# Patient Record
Sex: Male | Born: 1935
Health system: Southern US, Community
[De-identification: ages and names within clinical notes are randomized; demographics above are authoritative.]

## PROBLEM LIST (undated history)

## (undated) DIAGNOSIS — I208 Other forms of angina pectoris: Secondary | ICD-10-CM

## (undated) DIAGNOSIS — N200 Calculus of kidney: Secondary | ICD-10-CM

## (undated) DIAGNOSIS — Z955 Presence of coronary angioplasty implant and graft: Secondary | ICD-10-CM

## (undated) DIAGNOSIS — I10 Essential (primary) hypertension: Secondary | ICD-10-CM

## (undated) DIAGNOSIS — M1990 Unspecified osteoarthritis, unspecified site: Secondary | ICD-10-CM

## (undated) DIAGNOSIS — I4891 Unspecified atrial fibrillation: Secondary | ICD-10-CM

## (undated) DIAGNOSIS — G4733 Obstructive sleep apnea (adult) (pediatric): Secondary | ICD-10-CM

## (undated) DIAGNOSIS — I2510 Atherosclerotic heart disease of native coronary artery without angina pectoris: Secondary | ICD-10-CM

## (undated) DIAGNOSIS — I4892 Unspecified atrial flutter: Secondary | ICD-10-CM

## (undated) DIAGNOSIS — I499 Cardiac arrhythmia, unspecified: Secondary | ICD-10-CM

## (undated) DIAGNOSIS — D509 Iron deficiency anemia, unspecified: Secondary | ICD-10-CM

## (undated) DIAGNOSIS — R531 Weakness: Secondary | ICD-10-CM

## (undated) DIAGNOSIS — C679 Malignant neoplasm of bladder, unspecified: Secondary | ICD-10-CM

## (undated) DIAGNOSIS — Z87898 Personal history of other specified conditions: Secondary | ICD-10-CM

## (undated) DIAGNOSIS — C4490 Unspecified malignant neoplasm of skin, unspecified: Secondary | ICD-10-CM

## (undated) DIAGNOSIS — R319 Hematuria, unspecified: Secondary | ICD-10-CM

## (undated) HISTORY — PX: CARDIOVASCULAR STRESS TEST: SHX262

## (undated) HISTORY — DX: Essential (primary) hypertension: I10

## (undated) HISTORY — DX: Presence of coronary angioplasty implant and graft: Z95.5

## (undated) HISTORY — PX: CARDIAC CATHETERIZATION: SHX172

## (undated) HISTORY — PX: TOTAL KNEE ARTHROPLASTY: SHX125

## (undated) HISTORY — PX: HERNIA REPAIR: SHX51

## (undated) HISTORY — PX: EYE SURGERY: SHX253

---

## 2000-11-29 ENCOUNTER — Ambulatory Visit (HOSPITAL_COMMUNITY): Admission: RE | Admit: 2000-11-29 | Discharge: 2000-11-29 | Payer: Self-pay | Admitting: Internal Medicine

## 2000-11-29 ENCOUNTER — Encounter: Payer: Self-pay | Admitting: Internal Medicine

## 2001-02-05 ENCOUNTER — Encounter: Payer: Self-pay | Admitting: *Deleted

## 2001-02-07 ENCOUNTER — Inpatient Hospital Stay (HOSPITAL_COMMUNITY): Admission: RE | Admit: 2001-02-07 | Discharge: 2001-02-12 | Payer: Self-pay | Admitting: *Deleted

## 2001-02-12 ENCOUNTER — Inpatient Hospital Stay (HOSPITAL_COMMUNITY)
Admission: RE | Admit: 2001-02-12 | Discharge: 2001-02-15 | Payer: Self-pay | Admitting: Physical Medicine & Rehabilitation

## 2001-02-16 ENCOUNTER — Emergency Department (HOSPITAL_COMMUNITY): Admission: EM | Admit: 2001-02-16 | Discharge: 2001-02-16 | Payer: Self-pay | Admitting: Internal Medicine

## 2001-02-16 ENCOUNTER — Encounter: Payer: Self-pay | Admitting: Internal Medicine

## 2001-03-01 ENCOUNTER — Emergency Department (HOSPITAL_COMMUNITY): Admission: EM | Admit: 2001-03-01 | Discharge: 2001-03-01 | Payer: Self-pay | Admitting: Emergency Medicine

## 2001-05-24 ENCOUNTER — Ambulatory Visit (HOSPITAL_COMMUNITY): Admission: RE | Admit: 2001-05-24 | Discharge: 2001-05-24 | Payer: Self-pay | Admitting: Internal Medicine

## 2001-05-24 ENCOUNTER — Encounter: Payer: Self-pay | Admitting: Internal Medicine

## 2001-07-30 ENCOUNTER — Other Ambulatory Visit: Admission: RE | Admit: 2001-07-30 | Discharge: 2001-07-30 | Payer: Self-pay | Admitting: Internal Medicine

## 2001-11-26 ENCOUNTER — Inpatient Hospital Stay (HOSPITAL_COMMUNITY): Admission: RE | Admit: 2001-11-26 | Discharge: 2001-11-30 | Payer: Self-pay | Admitting: *Deleted

## 2001-11-30 ENCOUNTER — Inpatient Hospital Stay (HOSPITAL_COMMUNITY)
Admission: RE | Admit: 2001-11-30 | Discharge: 2001-12-06 | Payer: Self-pay | Admitting: Physical Medicine & Rehabilitation

## 2002-01-29 ENCOUNTER — Observation Stay (HOSPITAL_COMMUNITY): Admission: RE | Admit: 2002-01-29 | Discharge: 2002-01-30 | Payer: Self-pay | Admitting: Internal Medicine

## 2002-07-21 ENCOUNTER — Ambulatory Visit (HOSPITAL_COMMUNITY): Admission: RE | Admit: 2002-07-21 | Discharge: 2002-07-21 | Payer: Self-pay | Admitting: Ophthalmology

## 2004-05-23 ENCOUNTER — Ambulatory Visit (HOSPITAL_COMMUNITY): Admission: RE | Admit: 2004-05-23 | Discharge: 2004-05-23 | Payer: Self-pay | Admitting: Ophthalmology

## 2005-05-03 ENCOUNTER — Ambulatory Visit: Payer: Self-pay | Admitting: Family Medicine

## 2005-05-03 ENCOUNTER — Ambulatory Visit (HOSPITAL_COMMUNITY): Admission: RE | Admit: 2005-05-03 | Discharge: 2005-05-03 | Payer: Self-pay | Admitting: Internal Medicine

## 2006-02-07 ENCOUNTER — Emergency Department (HOSPITAL_COMMUNITY): Admission: EM | Admit: 2006-02-07 | Discharge: 2006-02-07 | Payer: Self-pay | Admitting: Emergency Medicine

## 2007-08-22 ENCOUNTER — Encounter: Payer: Self-pay | Admitting: Family Medicine

## 2010-09-20 NOTE — Letter (Signed)
Summary: rpc chart  rpc chart   Imported By: Curtis Sites 06/06/2010 10:41:07  _____________________________________________________________________  External Attachment:    Type:   Image     Comment:   External Document

## 2012-04-29 DIAGNOSIS — M25549 Pain in joints of unspecified hand: Secondary | ICD-10-CM | POA: Diagnosis not present

## 2012-04-29 DIAGNOSIS — M2550 Pain in unspecified joint: Secondary | ICD-10-CM | POA: Diagnosis not present

## 2012-05-02 DIAGNOSIS — N289 Disorder of kidney and ureter, unspecified: Secondary | ICD-10-CM | POA: Diagnosis not present

## 2012-05-06 DIAGNOSIS — M19049 Primary osteoarthritis, unspecified hand: Secondary | ICD-10-CM | POA: Diagnosis not present

## 2012-05-06 DIAGNOSIS — M1120 Other chondrocalcinosis, unspecified site: Secondary | ICD-10-CM | POA: Diagnosis not present

## 2012-05-06 DIAGNOSIS — M25549 Pain in joints of unspecified hand: Secondary | ICD-10-CM | POA: Diagnosis not present

## 2012-06-04 DIAGNOSIS — J069 Acute upper respiratory infection, unspecified: Secondary | ICD-10-CM | POA: Diagnosis not present

## 2012-06-04 DIAGNOSIS — I10 Essential (primary) hypertension: Secondary | ICD-10-CM | POA: Diagnosis not present

## 2012-06-04 DIAGNOSIS — Z79899 Other long term (current) drug therapy: Secondary | ICD-10-CM | POA: Diagnosis not present

## 2012-06-04 DIAGNOSIS — M2550 Pain in unspecified joint: Secondary | ICD-10-CM | POA: Diagnosis not present

## 2012-06-12 DIAGNOSIS — Z23 Encounter for immunization: Secondary | ICD-10-CM | POA: Diagnosis not present

## 2012-06-21 ENCOUNTER — Encounter (INDEPENDENT_AMBULATORY_CARE_PROVIDER_SITE_OTHER): Payer: Self-pay | Admitting: *Deleted

## 2012-06-26 ENCOUNTER — Ambulatory Visit (INDEPENDENT_AMBULATORY_CARE_PROVIDER_SITE_OTHER): Payer: Medicare Other | Admitting: Internal Medicine

## 2012-06-26 ENCOUNTER — Encounter (INDEPENDENT_AMBULATORY_CARE_PROVIDER_SITE_OTHER): Payer: Self-pay | Admitting: Internal Medicine

## 2012-06-26 ENCOUNTER — Telehealth (INDEPENDENT_AMBULATORY_CARE_PROVIDER_SITE_OTHER): Payer: Self-pay | Admitting: *Deleted

## 2012-06-26 ENCOUNTER — Other Ambulatory Visit (INDEPENDENT_AMBULATORY_CARE_PROVIDER_SITE_OTHER): Payer: Self-pay | Admitting: *Deleted

## 2012-06-26 VITALS — BP 130/72 | HR 72 | Temp 97.9°F | Ht 72.0 in | Wt 187.3 lb

## 2012-06-26 DIAGNOSIS — D649 Anemia, unspecified: Secondary | ICD-10-CM | POA: Diagnosis not present

## 2012-06-26 DIAGNOSIS — Z1211 Encounter for screening for malignant neoplasm of colon: Secondary | ICD-10-CM

## 2012-06-26 LAB — IRON AND TIBC
%SAT: 8 % — ABNORMAL LOW (ref 20–55)
TIBC: 374 ug/dL (ref 215–435)
UIBC: 344 ug/dL (ref 125–400)

## 2012-06-26 NOTE — Progress Notes (Addendum)
Subjective:     Patient ID: Manuel Le, male   DOB: [birth year], 78 y.o.   MRN: [id number]  HPI Referred to our office by Dr. Margo Aye for anemia. Noted H and  H was 11.1 and 33.1, MCV 74 States he is tired all the time.  Appetite is good. He will eat anything he can get. No abdominal pain. He has a BM about one every day or every 2-3 days. No rectal bleeding or melena. He has never undergone a colonoscopy in the past.  Review of Systems Current Outpatient Prescriptions  Medication Sig Dispense Refill  . acetaminophen (TYLENOL) 500 MG tablet Take 500 mg by mouth every 6 (six) hours as needed.      Marland Kitchen aspirin 81 MG tablet Take 81 mg by mouth daily.      . cetirizine (ZYRTEC) 10 MG tablet Take 10 mg by mouth daily.      Marland Kitchen lisinopril-hydrochlorothiazide (PRINZIDE,ZESTORETIC) 20-12.5 MG per tablet Take 1 tablet by mouth daily.      . multivitamin-iron-minerals-folic acid (CENTRUM) chewable tablet Chew 1 tablet by mouth daily.      . Nutritional Supplements (OSTEO ADVANCE) TABS Take by mouth.       Past Medical History  Diagnosis Date  . Hypertension    Past Surgical History  Procedure Date  . Bilateral knee replacements      12-15 yrs ago.   History   Social History  . Marital Status: Married    Spouse Name: N/A    Number of Children: N/A  . Years of Education: N/A   Occupational History  . Not on file.   Social History Main Topics  . Smoking status: Never Smoker   . Smokeless tobacco: Not on file  . Alcohol Use: No  . Drug Use: No  . Sexually Active: Not on file   Other Topics Concern  . Not on file   Social History Narrative  . No narrative on file   Family Status  Relation Status Death Age  . Mother Deceased     old age  . Father Deceased     ulcers  . Sister Alive     good health  . Brother Alive     good health       Objective:   Physical Exam  Filed Vitals:   06/26/12 1111  BP: 130/72  Pulse: 72  Temp: 97.9 F (36.6 C)  Height: 6' (1.829 m)    Weight: 187 lb 4.8 oz (84.959 kg)   Alert and oriented. Skin warm and dry. Oral mucosa is moist.   . Sclera anicteric, conjunctivae is pink. Thyroid not enlarged. No cervical lymphadenopathy. Lungs clear. Heart regular rate and rhythm.  Abdomen is soft. Bowel sounds are positive. No hepatomegaly. No abdominal masses felt. No tenderness.  No edema to lower extremities.  Stool brown and guaiac negative.     Assessment:    Anemia. Colonic neoplasm, AVM, polyp needs to be ruled.    Plan:       Colonoscopy with Dr. Karilyn Cota. Iron studies.

## 2012-06-26 NOTE — Patient Instructions (Addendum)
Colonoscopy with DR. Rehman. Iron studies.

## 2012-06-26 NOTE — Telephone Encounter (Signed)
Patient needs half lytely 

## 2012-06-28 ENCOUNTER — Encounter (HOSPITAL_COMMUNITY): Payer: Self-pay | Admitting: Pharmacy Technician

## 2012-07-01 MED ORDER — PEG 3350/ELECTROLYTES 240 G PO SOLR: 1.0000 | Freq: Once | ORAL | Status: DC

## 2012-07-03 MED ORDER — SODIUM CHLORIDE 0.45 % IV SOLN
INTRAVENOUS | Status: DC
  Administered 2012-07-04: 1000 mL via INTRAVENOUS

## 2012-07-04 ENCOUNTER — Encounter (HOSPITAL_COMMUNITY): Payer: Self-pay | Admitting: *Deleted

## 2012-07-04 ENCOUNTER — Ambulatory Visit (HOSPITAL_COMMUNITY)
Admission: RE | Admit: 2012-07-04 | Discharge: 2012-07-04 | Disposition: A | Discharge status: Home / Self Care | Payer: Medicare Other | Source: Ambulatory Visit | Attending: Internal Medicine | Admitting: Internal Medicine

## 2012-07-04 ENCOUNTER — Encounter (HOSPITAL_COMMUNITY)
Admission: RE | Disposition: A | Discharge status: Home / Self Care | Payer: Self-pay | Source: Ambulatory Visit | Attending: Internal Medicine

## 2012-07-04 DIAGNOSIS — K2970 Gastritis, unspecified, without bleeding: Secondary | ICD-10-CM | POA: Insufficient documentation

## 2012-07-04 DIAGNOSIS — K2960 Other gastritis without bleeding: Secondary | ICD-10-CM

## 2012-07-04 DIAGNOSIS — I10 Essential (primary) hypertension: Secondary | ICD-10-CM | POA: Diagnosis not present

## 2012-07-04 DIAGNOSIS — K6389 Other specified diseases of intestine: Secondary | ICD-10-CM

## 2012-07-04 DIAGNOSIS — D539 Nutritional anemia, unspecified: Secondary | ICD-10-CM | POA: Insufficient documentation

## 2012-07-04 DIAGNOSIS — K5730 Diverticulosis of large intestine without perforation or abscess without bleeding: Secondary | ICD-10-CM | POA: Insufficient documentation

## 2012-07-04 DIAGNOSIS — D649 Anemia, unspecified: Secondary | ICD-10-CM

## 2012-07-04 DIAGNOSIS — K644 Residual hemorrhoidal skin tags: Secondary | ICD-10-CM | POA: Insufficient documentation

## 2012-07-04 DIAGNOSIS — D509 Iron deficiency anemia, unspecified: Secondary | ICD-10-CM

## 2012-07-04 HISTORY — PX: ESOPHAGOGASTRODUODENOSCOPY: SHX5428

## 2012-07-04 HISTORY — PX: COLONOSCOPY: SHX5424

## 2012-07-04 LAB — HEMOGLOBIN AND HEMATOCRIT, BLOOD: Hemoglobin: 10.7 g/dL — ABNORMAL LOW (ref 13.0–17.0)

## 2012-07-04 SURGERY — COLONOSCOPY
Anesthesia: Moderate Sedation

## 2012-07-04 MED ORDER — BUTAMBEN-TETRACAINE-BENZOCAINE 2-2-14 % EX AERO
INHALATION_SPRAY | CUTANEOUS | Status: DC | PRN
  Administered 2012-07-04: 2 via TOPICAL

## 2012-07-04 MED ORDER — MEPERIDINE HCL 50 MG/ML IJ SOLN
INTRAMUSCULAR | Status: AC
  Filled 2012-07-04: qty 1

## 2012-07-04 MED ORDER — STERILE WATER FOR IRRIGATION IR SOLN
Status: DC | PRN
  Administered 2012-07-04: 08:00:00

## 2012-07-04 MED ORDER — FERROUS SULFATE 325 (65 FE) MG PO TABS: 325.0000 mg | ORAL_TABLET | Freq: Two times a day (BID) | ORAL | Status: DC

## 2012-07-04 MED ORDER — MIDAZOLAM HCL 5 MG/5ML IJ SOLN
INTRAMUSCULAR | Status: DC | PRN
  Administered 2012-07-04: 1 mg via INTRAVENOUS
  Administered 2012-07-04 (×2): 2 mg via INTRAVENOUS
  Administered 2012-07-04: 1 mg via INTRAVENOUS
  Administered 2012-07-04: 2 mg via INTRAVENOUS

## 2012-07-04 MED ORDER — MIDAZOLAM HCL 5 MG/5ML IJ SOLN
INTRAMUSCULAR | Status: AC
  Filled 2012-07-04: qty 10

## 2012-07-04 MED ORDER — MEPERIDINE HCL 50 MG/ML IJ SOLN
INTRAMUSCULAR | Status: DC | PRN
  Administered 2012-07-04 (×2): 25 mg via INTRAVENOUS

## 2012-07-04 NOTE — Op Note (Signed)
COLONOSCOPY AND  EGD PROCEDURE REPORT  PATIENT:  Manuel Le  MR#:  [id number] Birthdate:  [birth year], 78 y.o., male Endoscopist:  Dr. Malissa Hippo, MD Referred By:  Dr. Bonnetta Barry ref. provider found Procedure Date: 07/04/2012  Procedure:  Colonoscopy followed by EGD.  Indications:  Patient is 78 year old Caucasian male who presents with a deficiency anemia. He has no GI symptoms. He's never been screened for CRC. He is on low-dose aspirin.            Informed Consent:  The risks, benefits, alternatives & imponderables which include, but are not limited to, bleeding, infection, perforation, drug reaction and potential missed lesion have been reviewed.  The potential for biopsy, lesion removal, esophageal dilation, etc. have also been discussed.  Questions have been answered.  All parties agreeable.  Please see history & physical in medical record for more information.  Medications:  Demerol 50 mg IV Versed 8 mg IV Cetacaine spray topically for oropharyngeal anesthesia   COLONOSCOPY Description of procedure:  After a digital rectal exam was performed, that colonoscope was advanced from the anus through the rectum and colon to the area of the cecum, ileocecal valve and appendiceal orifice. The cecum was deeply intubated. These structures were well-seen and photographed for the record. From the level of the cecum and ileocecal valve, the scope was slowly and cautiously withdrawn. The mucosal surfaces were carefully surveyed utilizing scope tip to flexion to facilitate fold flattening as needed. The scope was pulled down into the rectum where a thorough exam including retroflexion was performed.  Findings:   Prep satisfactory. Redundant tortuous colon. Mucosal pigmentation involving distal half of the colon. Scattered diverticula at sigmoid colon. Normal rectal mucosa. Prominent hemorrhoids below the dentate line.  Therapeutic/Diagnostic Maneuvers Performed:  None   Cecal Withdrawal Time:   9 minutes  EGD  Description of procedure:  The endoscope was introduced through the mouth and advanced to the second portion of the duodenum without difficulty or limitations. The mucosal surfaces were surveyed very carefully during advancement of the scope and upon withdrawal.  Findings:  Esophagus:  Mucosa of the esophagus was normal. GE junction was unremarkable. GEJ:  44 cm Stomach:  Stomach was empty and distended very well with insufflation. Folds in the proximal stomach were normal. Examination mucosa revealed patchy edema and erythema at gastric body and antrum. No erosions ulcers or other abnormalities noted. Pyloric channel was patent. Angularis fundus and cardia were examined by retroflexing the scope and were normal. Duodenum:  Normal bulbar and post bulbar mucosa.  Therapeutic/Diagnostic Maneuvers Performed:  None  Complications:  None  Impression:  Tortuous and redundant colon with few diverticula at sigmoid colon and external hemorrhoids. Nonerosive gastritis otherwise normal EGD. No lesion identified in upper or lower GI tract to account for patient's IDA.  Recommendations:  H&H and H. pylori serology to be checked today. Ferrous Sulfate 325 mg by mouth twice a day. Office visit in 8 weeks.  Chaylee Ehrsam U  07/04/2012 9:42 AM  CC: Dr. Dwana Melena, MD & Dr. Bonnetta Barry ref. provider found

## 2012-07-04 NOTE — H&P (Signed)
Manuel Le is an 78 y.o. male.   Chief Complaint: Patient is here for colonoscopy and EGD if colonoscopy is normal. HPI: Patient is a 89-year-old Caucasian male who has noted progressive weakness over the last 3-4 months. Workup revealed iron deficiency anemia. He has good appetite. He denies abdominal pain nausea vomiting diarrhea melena or rectal bleeding. He was seen in our office recently and his stool is guaiac negative. Patient's never been screened for CRC family history is negative for CRC.  Past Medical History  Diagnosis Date  . Hypertension     Past Surgical History  Procedure Date  . Bilateral knee replacements      12-15 yrs ago.    History reviewed. No pertinent family history. Social History:  reports that he has never smoked. He does not have any smokeless tobacco history on file. He reports that he does not drink alcohol or use illicit drugs.  Allergies: No Known Allergies  Medications Prior to Admission  Medication Sig Dispense Refill  . acetaminophen (TYLENOL) 500 MG tablet Take 500 mg by mouth every 6 (six) hours as needed. Pain      . aspirin EC 81 MG tablet Take 81 mg by mouth daily.      . Misc Natural Products (OSTEO BI-FLEX ADV TRIPLE ST PO) Take 1 tablet by mouth daily.      Marland Kitchen PEG 3350-KCl-NaBcb-NaCl-NaSulf (PEG 3350/ELECTROLYTES) 240 G SOLR Take 1 kit by mouth once.  1 Bottle  0  . cetirizine (ZYRTEC) 10 MG tablet Take 10 mg by mouth daily.      Marland Kitchen lisinopril-hydrochlorothiazide (PRINZIDE,ZESTORETIC) 20-12.5 MG per tablet Take 1 tablet by mouth daily.      . multivitamin-iron-minerals-folic acid (CENTRUM) chewable tablet Chew 1 tablet by mouth daily.        No results found for this or any previous visit (from the past 48 hour(s)). No results found.  ROS  Blood pressure 173/86, temperature 98.4 F (36.9 C), temperature source Oral, resp. rate 22, SpO2 97.00%. Physical Exam  Constitutional: He appears well-developed and well-nourished.  HENT:    Mouth/Throat: Oropharynx is clear and moist.  Eyes: Conjunctivae normal are normal. No scleral icterus.  Neck: No thyromegaly present.  Cardiovascular: Normal rate, regular rhythm and normal heart sounds.   No murmur heard. Respiratory: Effort normal and breath sounds normal.  GI: Soft. He exhibits no distension and no mass. There is no tenderness.  Musculoskeletal: He exhibits no edema.  Lymphadenopathy:    He has no cervical adenopathy.  Neurological: He is alert.  Skin: Skin is warm and dry.     Assessment/Plan Iron deficiency anemia. Colonoscopy plus minus EGD.  REHMAN,NAJEEB U 07/04/2012, 8:33 AM

## 2012-07-05 LAB — H. PYLORI ANTIBODY, IGG: H Pylori IgG: 6.4 {ISR} — ABNORMAL HIGH

## 2012-07-08 ENCOUNTER — Encounter (HOSPITAL_COMMUNITY): Payer: Self-pay | Admitting: Internal Medicine

## 2012-07-09 ENCOUNTER — Encounter (INDEPENDENT_AMBULATORY_CARE_PROVIDER_SITE_OTHER): Payer: Self-pay

## 2012-07-13 ENCOUNTER — Other Ambulatory Visit (INDEPENDENT_AMBULATORY_CARE_PROVIDER_SITE_OTHER): Payer: Self-pay | Admitting: Internal Medicine

## 2012-07-13 MED ORDER — PANTOPRAZOLE SODIUM 40 MG PO TBEC: 40.0000 mg | DELAYED_RELEASE_TABLET | Freq: Two times a day (BID) | ORAL | Status: DC

## 2012-07-13 MED ORDER — BIS SUBCIT-METRONID-TETRACYC 140-125-125 MG PO CAPS: 3.0000 | ORAL_CAPSULE | Freq: Three times a day (TID) | ORAL | Status: DC

## 2012-07-16 ENCOUNTER — Telehealth (INDEPENDENT_AMBULATORY_CARE_PROVIDER_SITE_OTHER): Payer: Self-pay | Admitting: *Deleted

## 2012-07-16 DIAGNOSIS — D649 Anemia, unspecified: Secondary | ICD-10-CM

## 2012-07-16 NOTE — Telephone Encounter (Signed)
Per Dr.Rehman the patient will need to have lab work in 8 weeks. Noted for Janary 2014.

## 2012-08-01 ENCOUNTER — Encounter (INDEPENDENT_AMBULATORY_CARE_PROVIDER_SITE_OTHER): Payer: Self-pay | Admitting: *Deleted

## 2012-08-01 ENCOUNTER — Telehealth (INDEPENDENT_AMBULATORY_CARE_PROVIDER_SITE_OTHER): Payer: Self-pay | Admitting: *Deleted

## 2012-08-01 DIAGNOSIS — D649 Anemia, unspecified: Secondary | ICD-10-CM

## 2012-08-01 NOTE — Telephone Encounter (Signed)
Lab order done 

## 2012-08-20 DIAGNOSIS — H538 Other visual disturbances: Secondary | ICD-10-CM | POA: Diagnosis not present

## 2012-08-20 DIAGNOSIS — Z961 Presence of intraocular lens: Secondary | ICD-10-CM | POA: Diagnosis not present

## 2012-09-10 ENCOUNTER — Other Ambulatory Visit (INDEPENDENT_AMBULATORY_CARE_PROVIDER_SITE_OTHER): Payer: Self-pay | Admitting: Internal Medicine

## 2012-09-10 DIAGNOSIS — D649 Anemia, unspecified: Secondary | ICD-10-CM | POA: Diagnosis not present

## 2012-09-12 ENCOUNTER — Telehealth (INDEPENDENT_AMBULATORY_CARE_PROVIDER_SITE_OTHER): Payer: Self-pay | Admitting: *Deleted

## 2012-09-12 DIAGNOSIS — D649 Anemia, unspecified: Secondary | ICD-10-CM

## 2012-09-12 NOTE — Telephone Encounter (Signed)
Per Dr.Rehman the patient will need to have CBC in 3 months

## 2012-09-20 DIAGNOSIS — D649 Anemia, unspecified: Secondary | ICD-10-CM | POA: Diagnosis not present

## 2012-09-20 DIAGNOSIS — R5381 Other malaise: Secondary | ICD-10-CM | POA: Diagnosis not present

## 2012-09-20 DIAGNOSIS — R5383 Other fatigue: Secondary | ICD-10-CM | POA: Diagnosis not present

## 2012-10-29 DIAGNOSIS — Z79899 Other long term (current) drug therapy: Secondary | ICD-10-CM | POA: Diagnosis not present

## 2012-10-29 DIAGNOSIS — M353 Polymyalgia rheumatica: Secondary | ICD-10-CM | POA: Diagnosis not present

## 2012-10-29 DIAGNOSIS — L84 Corns and callosities: Secondary | ICD-10-CM | POA: Diagnosis not present

## 2012-10-29 DIAGNOSIS — B382 Pulmonary coccidioidomycosis, unspecified: Secondary | ICD-10-CM | POA: Diagnosis not present

## 2012-10-29 DIAGNOSIS — I10 Essential (primary) hypertension: Secondary | ICD-10-CM | POA: Diagnosis not present

## 2012-11-14 ENCOUNTER — Other Ambulatory Visit (INDEPENDENT_AMBULATORY_CARE_PROVIDER_SITE_OTHER): Payer: Self-pay | Admitting: *Deleted

## 2012-11-14 ENCOUNTER — Encounter (INDEPENDENT_AMBULATORY_CARE_PROVIDER_SITE_OTHER): Payer: Self-pay | Admitting: *Deleted

## 2012-11-14 DIAGNOSIS — D649 Anemia, unspecified: Secondary | ICD-10-CM

## 2012-12-04 DIAGNOSIS — D649 Anemia, unspecified: Secondary | ICD-10-CM | POA: Diagnosis not present

## 2012-12-04 LAB — CBC
Hemoglobin: 13.7 g/dL (ref 13.0–17.0)
MCH: 29.7 pg (ref 26.0–34.0)
MCHC: 34.7 g/dL (ref 30.0–36.0)
MCV: 85.5 fL (ref 78.0–100.0)
Platelets: 201 10*3/uL (ref 150–400)
RBC: 4.62 MIL/uL (ref 4.22–5.81)

## 2012-12-13 ENCOUNTER — Telehealth (INDEPENDENT_AMBULATORY_CARE_PROVIDER_SITE_OTHER): Payer: Self-pay | Admitting: *Deleted

## 2012-12-13 DIAGNOSIS — D649 Anemia, unspecified: Secondary | ICD-10-CM

## 2012-12-13 NOTE — Telephone Encounter (Signed)
Per Dr.Rehman the patient will need to have labs drawn. 

## 2013-02-04 DIAGNOSIS — D649 Anemia, unspecified: Secondary | ICD-10-CM | POA: Diagnosis not present

## 2013-02-04 DIAGNOSIS — I10 Essential (primary) hypertension: Secondary | ICD-10-CM | POA: Diagnosis not present

## 2013-02-04 DIAGNOSIS — M353 Polymyalgia rheumatica: Secondary | ICD-10-CM | POA: Diagnosis not present

## 2013-03-20 ENCOUNTER — Encounter (INDEPENDENT_AMBULATORY_CARE_PROVIDER_SITE_OTHER): Payer: Self-pay | Admitting: *Deleted

## 2013-03-20 ENCOUNTER — Other Ambulatory Visit (INDEPENDENT_AMBULATORY_CARE_PROVIDER_SITE_OTHER): Payer: Self-pay | Admitting: *Deleted

## 2013-03-20 DIAGNOSIS — D649 Anemia, unspecified: Secondary | ICD-10-CM

## 2013-04-01 DIAGNOSIS — B079 Viral wart, unspecified: Secondary | ICD-10-CM | POA: Diagnosis not present

## 2013-04-01 DIAGNOSIS — K1370 Unspecified lesions of oral mucosa: Secondary | ICD-10-CM | POA: Diagnosis not present

## 2013-04-22 DIAGNOSIS — R5381 Other malaise: Secondary | ICD-10-CM | POA: Diagnosis not present

## 2013-04-22 DIAGNOSIS — I10 Essential (primary) hypertension: Secondary | ICD-10-CM | POA: Diagnosis not present

## 2013-05-20 DIAGNOSIS — D509 Iron deficiency anemia, unspecified: Secondary | ICD-10-CM | POA: Diagnosis not present

## 2013-05-20 DIAGNOSIS — I10 Essential (primary) hypertension: Secondary | ICD-10-CM | POA: Diagnosis not present

## 2013-06-17 DIAGNOSIS — I10 Essential (primary) hypertension: Secondary | ICD-10-CM | POA: Diagnosis not present

## 2013-08-25 DIAGNOSIS — L608 Other nail disorders: Secondary | ICD-10-CM | POA: Diagnosis not present

## 2013-08-25 DIAGNOSIS — I4891 Unspecified atrial fibrillation: Secondary | ICD-10-CM

## 2013-08-25 DIAGNOSIS — I10 Essential (primary) hypertension: Secondary | ICD-10-CM | POA: Diagnosis not present

## 2013-08-25 DIAGNOSIS — I499 Cardiac arrhythmia, unspecified: Secondary | ICD-10-CM | POA: Diagnosis not present

## 2013-09-02 DIAGNOSIS — H538 Other visual disturbances: Secondary | ICD-10-CM | POA: Diagnosis not present

## 2013-09-02 DIAGNOSIS — Z961 Presence of intraocular lens: Secondary | ICD-10-CM | POA: Diagnosis not present

## 2013-09-08 ENCOUNTER — Encounter: Payer: Self-pay | Admitting: Cardiovascular Disease

## 2013-09-08 ENCOUNTER — Ambulatory Visit (INDEPENDENT_AMBULATORY_CARE_PROVIDER_SITE_OTHER): Payer: Medicare Other | Admitting: Cardiovascular Disease

## 2013-09-08 VITALS — BP 160/65 | HR 55 | Ht 72.0 in | Wt 202.0 lb

## 2013-09-08 DIAGNOSIS — G4733 Obstructive sleep apnea (adult) (pediatric): Secondary | ICD-10-CM | POA: Diagnosis not present

## 2013-09-08 DIAGNOSIS — I10 Essential (primary) hypertension: Secondary | ICD-10-CM | POA: Insufficient documentation

## 2013-09-08 DIAGNOSIS — R5383 Other fatigue: Secondary | ICD-10-CM | POA: Insufficient documentation

## 2013-09-08 DIAGNOSIS — I4891 Unspecified atrial fibrillation: Secondary | ICD-10-CM

## 2013-09-08 DIAGNOSIS — R011 Cardiac murmur, unspecified: Secondary | ICD-10-CM

## 2013-09-08 DIAGNOSIS — R5381 Other malaise: Secondary | ICD-10-CM

## 2013-09-08 MED ORDER — WARFARIN SODIUM 5 MG PO TABS: 5.0000 mg | ORAL_TABLET | Freq: Every day | ORAL | Status: DC

## 2013-09-08 MED ORDER — LISINOPRIL-HYDROCHLOROTHIAZIDE 20-12.5 MG PO TABS: 1.0000 | ORAL_TABLET | Freq: Two times a day (BID) | ORAL | Status: DC

## 2013-09-08 NOTE — Progress Notes (Signed)
Patient ID: Manuel Le, male   DOB: 1927-07-01, 80 y.o.   MRN: [id number]       CARDIOLOGY CONSULT NOTE  Patient ID: Manuel Le MRN: [id number] DOB/AGE: [birth year] 79 y.o.  Admit date: (Not on file) Primary Physician Delphina Cahill, MD  Reason for Consultation: atrial fibrillation  HPI: The patient is an 80 year old male with a history of hypertension who has been referred for the management of atrial fibrillation. Unfortunately, the records from his primary care physician are unavailable to me at this time. The patient tells me that he had an ECG performed approximately 2 weeks ago which revealed "an abnormal heart rhythm". The referral that was placed to me included the diagnosis atrial fibrillation. The patient denies chest pain, shortness of breath, dizziness, palpitations, leg swelling, orthopnea and paroxysmal nocturnal dyspnea. He does tell me that in spite of the fact that he gets along night's sleep, he feels fatigued throughout the day and even shortly after waking up. He denies a history of congestive heart failure, myocardial infarction, diabetes and stroke. He said he has been free of health problems most of his life.    No Known Allergies  Current Outpatient Prescriptions  Medication Sig Dispense Refill  . acetaminophen (TYLENOL) 500 MG tablet Take 500 mg by mouth every 6 (six) hours as needed. Pain      . amLODipine (NORVASC) 5 MG tablet Take 5 mg by mouth daily.       Marland Kitchen aspirin EC 81 MG tablet Take 81 mg by mouth daily.      . cetirizine (ZYRTEC) 10 MG tablet Take 10 mg by mouth daily.      Marland Kitchen lisinopril-hydrochlorothiazide (PRINZIDE,ZESTORETIC) 20-12.5 MG per tablet Take 1 tablet by mouth daily.      . Misc Natural Products (OSTEO BI-FLEX ADV TRIPLE ST PO) Take 1 tablet by mouth daily.      . multivitamin-iron-minerals-folic acid (CENTRUM) chewable tablet Chew 1 tablet by mouth daily.      . penicillin v potassium (VEETID) 500 MG tablet Take 500 mg by mouth 4 (four)  times daily.        No current facility-administered medications for this visit.    Past Medical History  Diagnosis Date  . Hypertension     Past Surgical History  Procedure Laterality Date  . Bilateral knee replacements       12-15 yrs ago.  . Colonoscopy  07/04/2012    Procedure: COLONOSCOPY;  Surgeon: Rogene Houston, MD;  Location: [facility];  Service: Endoscopy;  Laterality: N/A;  320  . Esophagogastroduodenoscopy  07/04/2012    Procedure: ESOPHAGOGASTRODUODENOSCOPY (EGD);  Surgeon: Rogene Houston, MD;  Location: [facility];  Service: Endoscopy;  Laterality: N/A;    History   Social History  . Marital Status: Married    Spouse Name: N/A    Number of Children: N/A  . Years of Education: N/A   Occupational History  . Not on file.   Social History Main Topics  . Smoking status: Never Smoker   . Smokeless tobacco: Not on file  . Alcohol Use: No  . Drug Use: No  . Sexual Activity: Not on file   Other Topics Concern  . Not on file   Social History Narrative  . No narrative on file     No family history on file.   Prior to Admission medications   Medication Sig Start Date End Date Taking? Authorizing Provider  acetaminophen (TYLENOL) 500 MG tablet Take  500 mg by mouth every 6 (six) hours as needed. Pain   Yes Historical Provider, MD  amLODipine (NORVASC) 5 MG tablet Take 5 mg by mouth daily.  08/16/13  Yes Historical Provider, MD  aspirin EC 81 MG tablet Take 81 mg by mouth daily.   Yes Historical Provider, MD  cetirizine (ZYRTEC) 10 MG tablet Take 10 mg by mouth daily.   Yes Historical Provider, MD  lisinopril-hydrochlorothiazide (PRINZIDE,ZESTORETIC) 20-12.5 MG per tablet Take 1 tablet by mouth daily.   Yes Historical Provider, MD  Misc Natural Products (OSTEO BI-FLEX ADV TRIPLE ST PO) Take 1 tablet by mouth daily.   Yes Historical Provider, MD  multivitamin-iron-minerals-folic acid (CENTRUM) chewable tablet Chew 1 tablet by mouth daily.   Yes  Historical Provider, MD  penicillin v potassium (VEETID) 500 MG tablet Take 500 mg by mouth 4 (four) times daily.  09/02/13  Yes Historical Provider, MD     Review of systems complete and found to be negative unless listed above in HPI     Physical exam Blood pressure 160/65, pulse 55, height 6' (1.829 m), weight 202 lb (91.627 kg), SpO2 98.00%. General: NAD Neck: No JVD, no thyromegaly or thyroid nodule.  Lungs: Clear to auscultation bilaterally with normal respiratory effort. CV: Nondisplaced PMI.  Heart irregular, normal rate, normal S1/S2, II/VI ejection systolic murmur at RUSB and II/VI apical holosystolic murmur.  No peripheral edema.  No carotid bruit.  Normal pedal pulses.  Abdomen: Soft, nontender, no hepatosplenomegaly, no distention.  Skin: Intact without lesions or rashes.  Neurologic: Alert and oriented x 3.  Psych: Normal affect. Extremities: No clubbing or cyanosis.  HEENT: Normal.   Labs:   Lab Results  Component Value Date   WBC 10.0 12/04/2012   HGB 13.7 12/04/2012   HCT 39.5 12/04/2012   MCV 85.5 12/04/2012   PLT 201 12/04/2012   No results found for this basename: NA, K, CL, CO2, BUN, CREATININE, CALCIUM, LABALBU, PROT, BILITOT, ALKPHOS, ALT, AST, GLUCOSE,  in the last 168 hours No results found for this basename: CKTOTAL, CKMB, CKMBINDEX, TROPONINI    No results found for this basename: CHOL   No results found for this basename: HDL   No results found for this basename: LDLCALC   No results found for this basename: TRIG   No results found for this basename: CHOLHDL   No results found for this basename: LDLDIRECT         Studies: No results found.  ASSESSMENT AND PLAN: 1. Atrial fibrillation: I am in the process of obtaining the records from his primary care physician's office. It certainly appears he has atrial fibrillation as well as valvular heart disease, based on the character of his murmurs. I will obtain an echocardiogram to evaluate for  structural heart disease as well as systolic function. I will obtain a Holter monitor to evaluate his heart rate to see whether or not he has a controlled ventricular response being off of rate-slowing medications. Given the fact that his heart rate is controlled off of AV nodal blocking agents, he most certainly has conduction system disease. I will also start him on warfarin and enroll him in our anticoagulation clinic. His CHADS-Vasc score is 3, thus warranting anticoagulation as he is at high risk for a CVA. It also appears he may have undiagnosed sleep apnea, which is a risk factor for atrial fibrillation. I will proceed with a sleep study as he would likely benefit from CPAP. 2. Hypertension: uncontrolled. I will increase  lisinopril-HCTZ to 20/12.5 mg bid. 3. Fatigue: It appears he may have undiagnosed sleep apnea, which is a risk factor for atrial fibrillation. I will proceed with a sleep study as he would likely benefit from CPAP.  Dispo: f/u 2 weeks.   Signed: Kate Sable, M.D., F.A.C.C.  09/08/2013, 2:58 PM

## 2013-09-08 NOTE — Patient Instructions (Addendum)
Your physician has requested that you have an echocardiogram. Echocardiography is a painless test that uses sound waves to create images of your heart. It provides your doctor with information about the size and shape of your heart and how well your heart's chambers and valves are working. This procedure takes approximately one hour. There are no restrictions for this procedure. Your physician has recommended that you wear a 24 holter monitor. Holter monitors are medical devices that record the heart's electrical activity. Doctors most often use these monitors to diagnose arrhythmias. Arrhythmias are problems with the speed or rhythm of the heartbeat. The monitor is a small, portable device. You can wear one while you do your normal daily activities. This is usually used to diagnose what is causing palpitations/syncope (passing out). Referral to Dr. Adriana Reams to evaluate obstructive sleep apnea Increase Lisinopril/HCTZ to 20/12.5 twice a day  - new sent to pharm  Begin Coumadin 5mg  every evening - new sent to pharm - please begin tomorrow night Enroll in our coumadin clinic - first check on Friday Office will contact with results via phone or letter.   Continue all other medications.   Follow up in  2 weeks

## 2013-09-11 ENCOUNTER — Other Ambulatory Visit (INDEPENDENT_AMBULATORY_CARE_PROVIDER_SITE_OTHER): Payer: Medicare Other

## 2013-09-11 ENCOUNTER — Other Ambulatory Visit: Payer: Self-pay

## 2013-09-11 DIAGNOSIS — I359 Nonrheumatic aortic valve disorder, unspecified: Secondary | ICD-10-CM | POA: Diagnosis not present

## 2013-09-11 DIAGNOSIS — R011 Cardiac murmur, unspecified: Secondary | ICD-10-CM | POA: Diagnosis not present

## 2013-09-11 DIAGNOSIS — I4891 Unspecified atrial fibrillation: Secondary | ICD-10-CM

## 2013-09-11 DIAGNOSIS — I059 Rheumatic mitral valve disease, unspecified: Secondary | ICD-10-CM

## 2013-09-11 HISTORY — PX: TRANSTHORACIC ECHOCARDIOGRAM: SHX275

## 2013-09-12 ENCOUNTER — Ambulatory Visit (INDEPENDENT_AMBULATORY_CARE_PROVIDER_SITE_OTHER): Payer: Medicare Other | Admitting: *Deleted

## 2013-09-12 ENCOUNTER — Telehealth: Payer: Self-pay | Admitting: *Deleted

## 2013-09-12 DIAGNOSIS — Z5181 Encounter for therapeutic drug level monitoring: Secondary | ICD-10-CM | POA: Diagnosis not present

## 2013-09-12 DIAGNOSIS — I4891 Unspecified atrial fibrillation: Secondary | ICD-10-CM | POA: Diagnosis not present

## 2013-09-12 LAB — POCT INR: INR: 1.1

## 2013-09-12 NOTE — Telephone Encounter (Signed)
Message copied by Laurine Blazer on Fri Sep 12, 2013 10:29 AM ------      Message from: Kate Sable A      Created: Thu Sep 11, 2013  4:09 PM       No change in Rx. ------

## 2013-09-12 NOTE — Telephone Encounter (Signed)
Notes Recorded by Laurine Blazer, LPN on 05/19/2445 at 28:63 AM Patient notified and verbalized understanding.

## 2013-09-16 ENCOUNTER — Ambulatory Visit (INDEPENDENT_AMBULATORY_CARE_PROVIDER_SITE_OTHER): Payer: Medicare Other | Admitting: *Deleted

## 2013-09-16 DIAGNOSIS — I4891 Unspecified atrial fibrillation: Secondary | ICD-10-CM

## 2013-09-16 DIAGNOSIS — Z5181 Encounter for therapeutic drug level monitoring: Secondary | ICD-10-CM | POA: Diagnosis not present

## 2013-09-16 LAB — POCT INR: INR: 1.3

## 2013-09-17 DIAGNOSIS — G4733 Obstructive sleep apnea (adult) (pediatric): Secondary | ICD-10-CM | POA: Diagnosis not present

## 2013-09-17 DIAGNOSIS — R5381 Other malaise: Secondary | ICD-10-CM | POA: Diagnosis not present

## 2013-09-17 DIAGNOSIS — R5383 Other fatigue: Secondary | ICD-10-CM | POA: Diagnosis not present

## 2013-09-23 ENCOUNTER — Ambulatory Visit (INDEPENDENT_AMBULATORY_CARE_PROVIDER_SITE_OTHER): Payer: Medicare Other | Admitting: *Deleted

## 2013-09-23 DIAGNOSIS — I4891 Unspecified atrial fibrillation: Secondary | ICD-10-CM

## 2013-09-23 DIAGNOSIS — Z5181 Encounter for therapeutic drug level monitoring: Secondary | ICD-10-CM

## 2013-09-23 LAB — POCT INR: INR: 2

## 2013-09-29 ENCOUNTER — Ambulatory Visit (INDEPENDENT_AMBULATORY_CARE_PROVIDER_SITE_OTHER): Payer: Medicare Other | Admitting: Cardiovascular Disease

## 2013-09-29 ENCOUNTER — Encounter: Payer: Self-pay | Admitting: Cardiovascular Disease

## 2013-09-29 VITALS — BP 143/65 | HR 88 | Ht 72.0 in | Wt 198.0 lb

## 2013-09-29 DIAGNOSIS — G4733 Obstructive sleep apnea (adult) (pediatric): Secondary | ICD-10-CM

## 2013-09-29 DIAGNOSIS — K5900 Constipation, unspecified: Secondary | ICD-10-CM

## 2013-09-29 DIAGNOSIS — R5383 Other fatigue: Secondary | ICD-10-CM | POA: Diagnosis not present

## 2013-09-29 DIAGNOSIS — Z5181 Encounter for therapeutic drug level monitoring: Secondary | ICD-10-CM

## 2013-09-29 DIAGNOSIS — G4761 Periodic limb movement disorder: Secondary | ICD-10-CM | POA: Diagnosis not present

## 2013-09-29 DIAGNOSIS — R5381 Other malaise: Secondary | ICD-10-CM | POA: Diagnosis not present

## 2013-09-29 DIAGNOSIS — I4891 Unspecified atrial fibrillation: Secondary | ICD-10-CM

## 2013-09-29 DIAGNOSIS — Z6827 Body mass index (BMI) 27.0-27.9, adult: Secondary | ICD-10-CM | POA: Diagnosis not present

## 2013-09-29 DIAGNOSIS — I10 Essential (primary) hypertension: Secondary | ICD-10-CM

## 2013-09-29 NOTE — Progress Notes (Signed)
Patient ID: Manuel Le, male   DOB: 03-01-1927, 80 y.o.   MRN: [id number]      SUBJECTIVE: The patient is here to followup on the results of cardiovascular testing performed for the evaluation of structural heart disease and atrial fibrillation. His echocardiogram revealed normal left ventricular systolic function, EF 38-10%, moderate LVH, mild mitral and tricuspid regurgitation, and moderate pulmonic regurgitation. The left atrium was mildly dilated. His Holter monitor showed an average heart rate of 71 beats per minute. Sinus rhythm with PVCs were noted, occasionally occurring in couplets. PACs were also noted. There also appeared to be episodes of variable atrial flutter, with rates of 150 beats per minute at times. One pause was noted which lasted 1.94 seconds, and occurred at 2:21 AM.  The patient denies chest pain, dizziness, palpitations, leg swelling, orthopnea and paroxysmal nocturnal dyspnea. He continues to feel fatigued and is scheduled to undergo a sleep study tonight. He also has had constipation since his blood pressure medication was increased. He drinks a lot of Sprite and sweet tea throughout the day. He drinks one glass of water in the morning as well as coffee.     No Known Allergies  Current Outpatient Prescriptions  Medication Sig Dispense Refill  . acetaminophen (TYLENOL) 500 MG tablet Take 500 mg by mouth every 6 (six) hours as needed. Pain      . amLODipine (NORVASC) 5 MG tablet Take 5 mg by mouth daily.       Marland Kitchen aspirin EC 81 MG tablet Take 81 mg by mouth daily.      . cetirizine (ZYRTEC) 10 MG tablet Take 10 mg by mouth daily.      Marland Kitchen lisinopril-hydrochlorothiazide (PRINZIDE,ZESTORETIC) 20-12.5 MG per tablet Take 1 tablet by mouth 2 (two) times daily.  60 tablet  6  . Misc Natural Products (OSTEO BI-FLEX ADV TRIPLE ST PO) Take 1 tablet by mouth daily.      . multivitamin-iron-minerals-folic acid (CENTRUM) chewable tablet Chew 1 tablet by mouth daily.      Marland Kitchen warfarin  (COUMADIN) 5 MG tablet Take 1 tablet (5 mg total) by mouth daily. (as directed per anticoagulation clinic)  30 tablet  3  . losartan-hydrochlorothiazide (HYZAAR) 100-12.5 MG per tablet        No current facility-administered medications for this visit.    Past Medical History  Diagnosis Date  . Hypertension     Past Surgical History  Procedure Laterality Date  . Bilateral knee replacements       12-15 yrs ago.  . Colonoscopy  07/04/2012    Procedure: COLONOSCOPY;  Surgeon: Rogene Houston, MD;  Location: [facility];  Service: Endoscopy;  Laterality: N/A;  320  . Esophagogastroduodenoscopy  07/04/2012    Procedure: ESOPHAGOGASTRODUODENOSCOPY (EGD);  Surgeon: Rogene Houston, MD;  Location: [facility];  Service: Endoscopy;  Laterality: N/A;    History   Social History  . Marital Status: Married    Spouse Name: N/A    Number of Children: N/A  . Years of Education: N/A   Occupational History  . Not on file.   Social History Main Topics  . Smoking status: Never Smoker   . Smokeless tobacco: Not on file  . Alcohol Use: No  . Drug Use: No  . Sexual Activity: Not on file   Other Topics Concern  . Not on file   Social History Narrative  . No narrative on file     Filed Vitals:   09/29/13 1446  BP: 143/65  Pulse: 88  Height: 6' (1.829 m)  Weight: 198 lb (89.812 kg)  SpO2: 98%    PHYSICAL EXAM General: NAD  Neck: No JVD, no thyromegaly or thyroid nodule.  Lungs: Clear to auscultation bilaterally with normal respiratory effort.  CV: Nondisplaced PMI. Heart irregular, normal rate, normal S1/S2, II/VI ejection systolic murmur at RUSB and II/VI apical holosystolic murmur. No peripheral edema. No carotid bruit. Normal pedal pulses.  Abdomen: Soft, nontender, no hepatosplenomegaly, no distention.  Skin: Intact without lesions or rashes.  Neurologic: Alert and oriented x 3.  Psych: Normal affect.  Extremities: No clubbing or cyanosis.  HEENT: Normal.    ECG:  reviewed and available in electronic records.   Left ventricle: The cavity size was normal. Wall thickness was increased in a pattern of moderate LVH. Systolic function was normal. The estimated ejection fraction was in the range of 55% to 60%. Wall motion was normal; there were no regional wall motion abnormalities. The study was not technically sufficient to allow evaluation of LV diastolic dysfunction due to atrial fibrillation. - Aortic valve: Mildly to moderately calcified annulus. Trileaflet; mildly thickened leaflets. There was no stenosis. - Mitral valve: Calcified annulus. Mildly thickened leaflets . Mild regurgitation. - Left atrium: The atrium was mildly dilated. - Tricuspid valve: Mild regurgitation. - Pulmonic valve: Moderate regurgitation.     ASSESSMENT AND PLAN: 1. Atrial fibrillation/flutter: Continue warfarin for anticoagulation. Given the fact that his heart rate is controlled off of AV nodal blocking agents, he most certainly has conduction system disease. His CHADS-Vasc score is 3, thus warranting anticoagulation as he is at high risk for a CVA. It also appears he may have undiagnosed sleep apnea, which is a risk factor for atrial fibrillation. I already scheduled a sleep study which he will undergo tonight. 2. Hypertension: controlled with increase of lisinopril-HCTZ to 20/12.5 mg bid.  3. Fatigue: It appears he may have undiagnosed sleep apnea, which is a risk factor for atrial fibrillation. I already scheduled a sleep study which he will undergo tonight. 4. Constipation: This is likely due to inadequate water intake and increased caffeine and sugar-laden beverage consumption. I encouraged him to reduce his intake of Sprite, coffee and iced tea, and encouraged him to drink more water and to increase his fiber intake.  Dispo: f/u 2 months.    Kate Sable, M.D., F.A.C.C.

## 2013-09-29 NOTE — Patient Instructions (Signed)
Continue all current medications. Follow up in  2 months  

## 2013-10-03 ENCOUNTER — Ambulatory Visit (INDEPENDENT_AMBULATORY_CARE_PROVIDER_SITE_OTHER): Payer: Medicare Other | Admitting: *Deleted

## 2013-10-03 DIAGNOSIS — Z5181 Encounter for therapeutic drug level monitoring: Secondary | ICD-10-CM | POA: Diagnosis not present

## 2013-10-03 DIAGNOSIS — I4891 Unspecified atrial fibrillation: Secondary | ICD-10-CM | POA: Diagnosis not present

## 2013-10-03 DIAGNOSIS — R259 Unspecified abnormal involuntary movements: Secondary | ICD-10-CM | POA: Diagnosis not present

## 2013-10-03 DIAGNOSIS — G4733 Obstructive sleep apnea (adult) (pediatric): Secondary | ICD-10-CM | POA: Diagnosis not present

## 2013-10-03 LAB — POCT INR: INR: 2.4

## 2013-10-07 DIAGNOSIS — G4733 Obstructive sleep apnea (adult) (pediatric): Secondary | ICD-10-CM | POA: Diagnosis not present

## 2013-10-07 DIAGNOSIS — R5383 Other fatigue: Secondary | ICD-10-CM | POA: Diagnosis not present

## 2013-10-07 DIAGNOSIS — R259 Unspecified abnormal involuntary movements: Secondary | ICD-10-CM | POA: Diagnosis not present

## 2013-10-07 DIAGNOSIS — R5381 Other malaise: Secondary | ICD-10-CM | POA: Diagnosis not present

## 2013-10-21 ENCOUNTER — Ambulatory Visit (INDEPENDENT_AMBULATORY_CARE_PROVIDER_SITE_OTHER): Payer: Medicare Other | Admitting: *Deleted

## 2013-10-21 DIAGNOSIS — I4891 Unspecified atrial fibrillation: Secondary | ICD-10-CM

## 2013-10-21 DIAGNOSIS — Z5181 Encounter for therapeutic drug level monitoring: Secondary | ICD-10-CM | POA: Diagnosis not present

## 2013-10-21 LAB — POCT INR: INR: 1.5

## 2013-11-03 ENCOUNTER — Ambulatory Visit (INDEPENDENT_AMBULATORY_CARE_PROVIDER_SITE_OTHER): Payer: Medicare Other | Admitting: *Deleted

## 2013-11-03 DIAGNOSIS — I4891 Unspecified atrial fibrillation: Secondary | ICD-10-CM

## 2013-11-03 DIAGNOSIS — Z5181 Encounter for therapeutic drug level monitoring: Secondary | ICD-10-CM

## 2013-11-03 LAB — POCT INR: INR: 1.5

## 2013-11-05 DIAGNOSIS — G4710 Hypersomnia, unspecified: Secondary | ICD-10-CM | POA: Diagnosis not present

## 2013-11-05 DIAGNOSIS — R259 Unspecified abnormal involuntary movements: Secondary | ICD-10-CM | POA: Diagnosis not present

## 2013-11-10 ENCOUNTER — Ambulatory Visit (INDEPENDENT_AMBULATORY_CARE_PROVIDER_SITE_OTHER): Payer: Medicare Other | Admitting: *Deleted

## 2013-11-10 DIAGNOSIS — Z5181 Encounter for therapeutic drug level monitoring: Secondary | ICD-10-CM

## 2013-11-10 DIAGNOSIS — I4891 Unspecified atrial fibrillation: Secondary | ICD-10-CM

## 2013-11-10 LAB — POCT INR: INR: 2.3

## 2013-11-12 ENCOUNTER — Telehealth: Payer: Self-pay | Admitting: *Deleted

## 2013-11-12 MED ORDER — WARFARIN SODIUM 5 MG PO TABS: ORAL_TABLET | ORAL | Status: DC

## 2013-11-12 NOTE — Telephone Encounter (Signed)
Patient states that he needs refill on his Warfarin / tgs

## 2013-11-20 ENCOUNTER — Ambulatory Visit (INDEPENDENT_AMBULATORY_CARE_PROVIDER_SITE_OTHER): Payer: Medicare Other | Admitting: *Deleted

## 2013-11-20 DIAGNOSIS — Z5181 Encounter for therapeutic drug level monitoring: Secondary | ICD-10-CM | POA: Diagnosis not present

## 2013-11-20 DIAGNOSIS — I4891 Unspecified atrial fibrillation: Secondary | ICD-10-CM

## 2013-11-20 LAB — POCT INR: INR: 1.5

## 2013-11-27 ENCOUNTER — Encounter (HOSPITAL_COMMUNITY): Payer: Self-pay | Admitting: Emergency Medicine

## 2013-11-27 ENCOUNTER — Emergency Department (HOSPITAL_COMMUNITY)
Admission: EM | Admit: 2013-11-27 | Discharge: 2013-11-28 | Disposition: A | Discharge status: Home / Self Care | Payer: Medicare Other | Attending: Emergency Medicine | Admitting: Emergency Medicine

## 2013-11-27 DIAGNOSIS — I2510 Atherosclerotic heart disease of native coronary artery without angina pectoris: Secondary | ICD-10-CM | POA: Insufficient documentation

## 2013-11-27 DIAGNOSIS — Z7901 Long term (current) use of anticoagulants: Secondary | ICD-10-CM | POA: Diagnosis not present

## 2013-11-27 DIAGNOSIS — Z7982 Long term (current) use of aspirin: Secondary | ICD-10-CM | POA: Diagnosis not present

## 2013-11-27 DIAGNOSIS — N390 Urinary tract infection, site not specified: Secondary | ICD-10-CM | POA: Insufficient documentation

## 2013-11-27 DIAGNOSIS — R319 Hematuria, unspecified: Secondary | ICD-10-CM

## 2013-11-27 DIAGNOSIS — I10 Essential (primary) hypertension: Secondary | ICD-10-CM | POA: Insufficient documentation

## 2013-11-27 HISTORY — DX: Atherosclerotic heart disease of native coronary artery without angina pectoris: I25.10

## 2013-11-27 LAB — PROTIME-INR
INR: 2.17 — ABNORMAL HIGH (ref 0.00–1.49)
Prothrombin Time: 23.5 seconds — ABNORMAL HIGH (ref 11.6–15.2)

## 2013-11-27 LAB — BASIC METABOLIC PANEL
BUN: 27 mg/dL — ABNORMAL HIGH (ref 6–23)
CHLORIDE: 103 meq/L (ref 96–112)
CO2: 24 meq/L (ref 19–32)
Calcium: 9.1 mg/dL (ref 8.4–10.5)
Creatinine, Ser: 0.94 mg/dL (ref 0.50–1.35)
GFR calc Af Amer: 87 mL/min — ABNORMAL LOW (ref >90)
GFR calc non Af Amer: 75 mL/min — ABNORMAL LOW (ref >90)
GLUCOSE: 134 mg/dL — AB (ref 70–99)
POTASSIUM: 4.1 meq/L (ref 3.7–5.3)
Sodium: 139 mEq/L (ref 137–147)

## 2013-11-27 LAB — CBC WITH DIFFERENTIAL/PLATELET
BASOS ABS: 0.1 10*3/uL (ref 0.0–0.1)
Basophils Relative: 1 % (ref 0–1)
EOS ABS: 0.5 10*3/uL (ref 0.0–0.7)
Eosinophils Relative: 7 % — ABNORMAL HIGH (ref 0–5)
HCT: 31.9 % — ABNORMAL LOW (ref 39.0–52.0)
HEMOGLOBIN: 10.8 g/dL — AB (ref 13.0–17.0)
Lymphocytes Relative: 19 % (ref 12–46)
Lymphs Abs: 1.3 10*3/uL (ref 0.7–4.0)
MCH: 29.9 pg (ref 26.0–34.0)
MCHC: 33.9 g/dL (ref 30.0–36.0)
MCV: 88.4 fL (ref 78.0–100.0)
MONO ABS: 0.6 10*3/uL (ref 0.1–1.0)
MONOS PCT: 9 % (ref 3–12)
Neutro Abs: 4.2 10*3/uL (ref 1.7–7.7)
Neutrophils Relative %: 64 % (ref 43–77)
Platelets: 178 10*3/uL (ref 150–400)
RBC: 3.61 MIL/uL — ABNORMAL LOW (ref 4.22–5.81)
RDW: 12.9 % (ref 11.5–15.5)
WBC: 6.6 10*3/uL (ref 4.0–10.5)

## 2013-11-27 LAB — URINE MICROSCOPIC-ADD ON

## 2013-11-27 LAB — URINALYSIS, ROUTINE W REFLEX MICROSCOPIC
BILIRUBIN URINE: NEGATIVE
Glucose, UA: NEGATIVE mg/dL
KETONES UR: NEGATIVE mg/dL
LEUKOCYTES UA: NEGATIVE
Nitrite: POSITIVE — AB
PH: 6.5 (ref 5.0–8.0)
Specific Gravity, Urine: >1.03 — ABNORMAL HIGH (ref 1.005–1.030)
Urobilinogen, UA: 1 mg/dL (ref 0.0–1.0)

## 2013-11-27 MED ORDER — CEFTRIAXONE SODIUM 1 G IJ SOLR
1.0000 g | Freq: Once | INTRAMUSCULAR | Status: AC
  Administered 2013-11-27: 1 g via INTRAMUSCULAR
  Filled 2013-11-27: qty 10

## 2013-11-27 MED ORDER — LIDOCAINE HCL (PF) 1 % IJ SOLN
INTRAMUSCULAR | Status: AC
  Filled 2013-11-27: qty 5

## 2013-11-27 NOTE — ED Notes (Signed)
Patient states he is having bright red blood in his urine x 1 hour.  Patient states he takes blood thinners.

## 2013-11-27 NOTE — ED Provider Notes (Signed)
CSN: [id number]     Arrival date & time 11/27/13  2138 History  This chart was scribed for Hoy Morn, MD by Jenne Campus, ED Scribe. This patient was seen in room APA16A/APA16A and the patient's care was started at 11:34 PM.   Chief Complaint  Patient presents with  . Hematuria    The history is provided by the patient. No language interpreter was used.    HPI Comments: Manuel Le is a 81 y.o. male who presents to the Emergency Department with wife and niece complaining of sudden onset hematuria described as bright red blood in his urine that started 2 hours ago. He reports ongoing urinary frequency and urinary urge over the past month since being started on lisinopril HCTZ and Losartan HCTZ one week ago. He denies any changes with the onset of the hematuria. He denies any dysuria, fevers, chills, abdominal pain, nausea and emesis as associated symptoms. He was started on Coumadin 2 months ago by a Cardiologist for stroke risk (atrial fibrillation) and also takes an ASA daily.   PCP is Dr. [name]   Past Medical History  Diagnosis Date  . Hypertension   . Coronary artery disease    Past Surgical History  Procedure Laterality Date  . Bilateral knee replacements       12-15 yrs ago.  . Colonoscopy  07/04/2012    Procedure: COLONOSCOPY;  Surgeon: Rogene Houston, MD;  Location: [facility];  Service: Endoscopy;  Laterality: N/A;  320  . Esophagogastroduodenoscopy  07/04/2012    Procedure: ESOPHAGOGASTRODUODENOSCOPY (EGD);  Surgeon: Rogene Houston, MD;  Location: [facility];  Service: Endoscopy;  Laterality: N/A;   No family history on file. History  Substance Use Topics  . Smoking status: Never Smoker   . Smokeless tobacco: Not on file  . Alcohol Use: No    Review of Systems  A complete 10 system review of systems was obtained and all systems are negative except as noted in the HPI and PMH.    Allergies  Review of patient's allergies indicates no known  allergies.  Home Medications   Current Outpatient Rx  Name  Route  Sig  Dispense  Refill  . acetaminophen (TYLENOL) 500 MG tablet   Oral   Take 500 mg by mouth every 6 (six) hours as needed. Pain         . amLODipine (NORVASC) 5 MG tablet   Oral   Take 5 mg by mouth every morning.          Marland Kitchen aspirin EC 81 MG tablet   Oral   Take 81 mg by mouth every morning.          . cetirizine (ZYRTEC) 10 MG tablet   Oral   Take 10 mg by mouth every morning.          Marland Kitchen losartan-hydrochlorothiazide (HYZAAR) 100-12.5 MG per tablet   Oral   Take 1 tablet by mouth every morning.          . Misc Natural Products (OSTEO BI-FLEX ADV JOINT SHIELD) TABS   Oral   Take 1 tablet by mouth every morning.         . Multiple Vitamins-Minerals (CENTRUM SILVER ADULT 50+) TABS   Oral   Take 1 tablet by mouth every morning.         . pramipexole (MIRAPEX) 0.5 MG tablet   Oral   Take 0.5 mg by mouth at bedtime.         Marland Kitchen  warfarin (COUMADIN) 5 MG tablet   Oral   Take 5 mg by mouth See admin instructions. Takes two tablets at 7pm daily. Takes one and one-half tablet on Thursdays and Sundays.         . cephALEXin (KEFLEX) 500 MG capsule   Oral   Take 1 capsule (500 mg total) by mouth 4 (four) times daily.   28 capsule   0    Triage Vitals: BP 159/63  Pulse 79  Temp(Src) 97.6 F (36.4 C) (Oral)  Resp 20  Ht 6' (1.829 m)  Wt 200 lb (90.719 kg)  BMI 27.12 kg/m2  SpO2 99%  Physical Exam  Nursing note and vitals reviewed. Constitutional: He is oriented to person, place, and time. He appears well-developed and well-nourished.  HENT:  Head: Normocephalic and atraumatic.  Eyes: EOM are normal.  Neck: Normal range of motion.  Cardiovascular: Normal rate, regular rhythm, normal heart sounds and intact distal pulses.   Pulmonary/Chest: Effort normal and breath sounds normal. No respiratory distress.  Abdominal: Soft. He exhibits no distension. There is no tenderness.   Musculoskeletal: Normal range of motion.  Neurological: He is alert and oriented to person, place, and time.  Skin: Skin is warm and dry.  Psychiatric: He has a normal mood and affect. Judgment normal.    ED Course  Procedures (including critical care time)  Medications  lidocaine (PF) (XYLOCAINE) 1 % injection (not administered)  cefTRIAXone (ROCEPHIN) injection 1 g (1 g Intramuscular Given 11/27/13 2355)    DIAGNOSTIC STUDIES: Oxygen Saturation is 99% on RA, normal by my interpretation.    COORDINATION OF CARE: 11:38 PM-Informed pt that UA is suggestive of an UTI. INR is within normal range. Discussed discharge plan which includes antibiotics with pt at bedside and pt agreed to plan. Addressed symptoms to return for with pt. Asked pt to stop lisinopril HCTZ and follow up with PCP further.   Labs Review Labs Reviewed  URINALYSIS, ROUTINE W REFLEX MICROSCOPIC - Abnormal; Notable for the following:    Color, Urine RED (*)    APPearance HAZY (*)    Specific Gravity, Urine >1.030 (*)    Hgb urine dipstick LARGE (*)    Protein, ur >300 (*)    Nitrite POSITIVE (*)    All other components within normal limits  CBC WITH DIFFERENTIAL - Abnormal; Notable for the following:    RBC 3.61 (*)    Hemoglobin 10.8 (*)    HCT 31.9 (*)    Eosinophils Relative 7 (*)    All other components within normal limits  PROTIME-INR - Abnormal; Notable for the following:    Prothrombin Time 23.5 (*)    INR 2.17 (*)    All other components within normal limits  URINE MICROSCOPIC-ADD ON - Abnormal; Notable for the following:    Bacteria, UA MANY (*)    All other components within normal limits  BASIC METABOLIC PANEL - Abnormal; Notable for the following:    Glucose, Bld 134 (*)    BUN 27 (*)    GFR calc non Af Amer 75 (*)    GFR calc Af Amer 87 (*)    All other components within normal limits  URINE CULTURE   Imaging Review No results found.   EKG Interpretation None      MDM   Final  diagnoses:  Urinary tract infection  Hematuria    Overall well appearing. Suspect UTI. Urine culture sent. Rocephin in ER. Home with keflex. Pt on ACE/HCTZ and  ARB/HCTZ. Feels weak since starting recent ARB/HCTZ. Renal function looks good. Will stop ACE/HCTZ and have follow up with his pcp for additional recommendations. Vitals without signficant abnormality. Understands to return to ER for new or worsening symptoms    I personally performed the services described in this documentation, which was scribed in my presence. The recorded information has been reviewed and is accurate.       Hoy Morn, MD 11/28/13 [phone]

## 2013-11-28 ENCOUNTER — Ambulatory Visit (INDEPENDENT_AMBULATORY_CARE_PROVIDER_SITE_OTHER): Payer: Medicare Other | Admitting: *Deleted

## 2013-11-28 DIAGNOSIS — Z5181 Encounter for therapeutic drug level monitoring: Secondary | ICD-10-CM

## 2013-11-28 DIAGNOSIS — I4891 Unspecified atrial fibrillation: Secondary | ICD-10-CM

## 2013-11-28 MED ORDER — CEPHALEXIN 500 MG PO CAPS: 500.0000 mg | ORAL_CAPSULE | Freq: Four times a day (QID) | ORAL | Status: DC

## 2013-11-28 NOTE — Discharge Instructions (Signed)
Urinary Tract Infection Urinary tract infections (UTIs) can develop anywhere along your urinary tract. Your urinary tract is your body's drainage system for removing wastes and extra water. Your urinary tract includes two kidneys, two ureters, a bladder, and a urethra. Your kidneys are a pair of bean-shaped organs. Each kidney is about the size of your fist. They are located below your ribs, one on each side of your spine. CAUSES Infections are caused by microbes, which are microscopic organisms, including fungi, viruses, and bacteria. These organisms are so small that they can only be seen through a microscope. Bacteria are the microbes that most commonly cause UTIs. SYMPTOMS  Symptoms of UTIs may vary by age and gender of the patient and by the location of the infection. Symptoms in young women typically include a frequent and intense urge to urinate and a painful, burning feeling in the bladder or urethra during urination. Older women and men are more likely to be tired, shaky, and weak and have muscle aches and abdominal pain. A fever may mean the infection is in your kidneys. Other symptoms of a kidney infection include pain in your back or sides below the ribs, nausea, and vomiting. DIAGNOSIS To diagnose a UTI, your caregiver will ask you about your symptoms. Your caregiver also will ask to provide a urine sample. The urine sample will be tested for bacteria and white blood cells. White blood cells are made by your body to help fight infection. TREATMENT  Typically, UTIs can be treated with medication. Because most UTIs are caused by a bacterial infection, they usually can be treated with the use of antibiotics. The choice of antibiotic and length of treatment depend on your symptoms and the type of bacteria causing your infection. HOME CARE INSTRUCTIONS  If you were prescribed antibiotics, take them exactly as your caregiver instructs you. Finish the medication even if you feel better after you  have only taken some of the medication.  Drink enough water and fluids to keep your urine clear or pale yellow.  Avoid caffeine, tea, and carbonated beverages. They tend to irritate your bladder.  Empty your bladder often. Avoid holding urine for long periods of time.  Empty your bladder before and after sexual intercourse.  After a bowel movement, women should cleanse from front to back. Use each tissue only once. SEEK MEDICAL CARE IF:   You have back pain.  You develop a fever.  Your symptoms do not begin to resolve within 3 days. SEEK IMMEDIATE MEDICAL CARE IF:   You have severe back pain or lower abdominal pain.  You develop chills.  You have nausea or vomiting.  You have continued burning or discomfort with urination. MAKE SURE YOU:   Understand these instructions.  Will watch your condition.  Will get help right away if you are not doing well or get worse. Document Released: 05/17/2005 Document Revised: 02/06/2012 Document Reviewed: 09/15/2011 [facility] Patient Information 2014 [place].  Hematuria, Adult Hematuria is blood in your urine. It can be caused by a bladder infection, kidney infection, prostate infection, kidney stone, or cancer of your urinary tract. Infections can usually be treated with medicine, and a kidney stone usually will pass through your urine. If neither of these is the cause of your hematuria, further workup to find out the reason may be needed. It is very important that you tell your health care provider about any blood you see in your urine, even if the blood stops without treatment or happens without causing  pain. Blood in your urine that happens and then stops and then happens again can be a symptom of a very serious condition. Also, pain is not a symptom in the initial stages of many urinary cancers. HOME CARE INSTRUCTIONS   Drink lots of fluid, 3 4 quarts a day. If you have been diagnosed with an infection, cranberry juice is  especially recommended, in addition to large amounts of water.  Avoid caffeine, tea, and carbonated beverages, because they tend to irritate the bladder.  Avoid alcohol because it may irritate the prostate.  Only take over-the-counter or prescription medicines for pain, discomfort, or fever as directed by your health care provider.  If you have been diagnosed with a kidney stone, follow your health care provider's instructions regarding straining your urine to catch the stone.  Empty your bladder often. Avoid holding urine for long periods of time.  After a bowel movement, women should cleanse front to back. Use each tissue only once.  Empty your bladder before and after sexual intercourse if you are a male. SEEK MEDICAL CARE IF: You develop back pain, fever, a feeling of sickness in your stomach (nausea), or vomiting or if your symptoms are not better in 3 days. Return sooner if you are getting worse. SEEK IMMEDIATE MEDICAL CARE IF:   You have a persistent fever, with a temperature of 101.38F (38.8C) or greater.  You develop severe vomiting and are unable to keep the medicine down.  You develop severe back or abdominal pain despite taking your medicines.  You begin passing a large amount of blood or clots in your urine.  You feel extremely weak or faint, or you pass out. MAKE SURE YOU:   Understand these instructions.  Will watch your condition.  Will get help right away if you are not doing well or get worse. Document Released: 08/07/2005 Document Revised: 05/28/2013 Document Reviewed: 04/07/2013 [facility] Patient Information 2014 [place].

## 2013-11-29 LAB — URINE CULTURE
COLONY COUNT: NO GROWTH
CULTURE: NO GROWTH

## 2013-12-01 DIAGNOSIS — R319 Hematuria, unspecified: Secondary | ICD-10-CM | POA: Diagnosis not present

## 2013-12-08 ENCOUNTER — Encounter: Payer: Self-pay | Admitting: *Deleted

## 2013-12-08 ENCOUNTER — Ambulatory Visit (INDEPENDENT_AMBULATORY_CARE_PROVIDER_SITE_OTHER): Payer: Medicare Other | Admitting: Cardiovascular Disease

## 2013-12-08 ENCOUNTER — Encounter: Payer: Self-pay | Admitting: Cardiovascular Disease

## 2013-12-08 ENCOUNTER — Ambulatory Visit (INDEPENDENT_AMBULATORY_CARE_PROVIDER_SITE_OTHER): Payer: Medicare Other | Admitting: *Deleted

## 2013-12-08 VITALS — BP 148/60 | HR 86 | Ht 72.0 in | Wt 205.0 lb

## 2013-12-08 DIAGNOSIS — Z5181 Encounter for therapeutic drug level monitoring: Secondary | ICD-10-CM

## 2013-12-08 DIAGNOSIS — I4891 Unspecified atrial fibrillation: Secondary | ICD-10-CM

## 2013-12-08 DIAGNOSIS — R531 Weakness: Secondary | ICD-10-CM

## 2013-12-08 DIAGNOSIS — E559 Vitamin D deficiency, unspecified: Secondary | ICD-10-CM

## 2013-12-08 DIAGNOSIS — R5383 Other fatigue: Secondary | ICD-10-CM | POA: Diagnosis not present

## 2013-12-08 DIAGNOSIS — R5381 Other malaise: Secondary | ICD-10-CM

## 2013-12-08 DIAGNOSIS — D509 Iron deficiency anemia, unspecified: Secondary | ICD-10-CM

## 2013-12-08 DIAGNOSIS — D649 Anemia, unspecified: Secondary | ICD-10-CM

## 2013-12-08 DIAGNOSIS — D539 Nutritional anemia, unspecified: Secondary | ICD-10-CM

## 2013-12-08 DIAGNOSIS — R319 Hematuria, unspecified: Secondary | ICD-10-CM

## 2013-12-08 DIAGNOSIS — R0602 Shortness of breath: Secondary | ICD-10-CM

## 2013-12-08 DIAGNOSIS — I10 Essential (primary) hypertension: Secondary | ICD-10-CM

## 2013-12-08 DIAGNOSIS — Z79899 Other long term (current) drug therapy: Secondary | ICD-10-CM

## 2013-12-08 LAB — POCT INR: INR: 2

## 2013-12-08 NOTE — Patient Instructions (Addendum)
   Stop Aspirin Continue all other medications.    Labs for TSH, free T4, Vitamin D, Vitamin B12 Your physician has requested that you have a lexiscan myoview. For further information please visit [email or web address]. Please follow instruction sheet, as given. Office will contact with results via phone or letter.   Follow up in  1 month

## 2013-12-08 NOTE — Addendum Note (Signed)
Addended by: Laurine Blazer on: 12/08/2013 10:40 AM   Modules accepted: Orders, Medications

## 2013-12-08 NOTE — Addendum Note (Signed)
Addended by: Laurine Blazer on: 12/08/2013 04:15 PM   Modules accepted: Orders

## 2013-12-08 NOTE — Progress Notes (Signed)
Patient ID: Manuel Le, male   DOB: [birth year], 80 y.o.   MRN: [id number]      SUBJECTIVE: Manuel Le is here to followup for hypertension atrial fibrillation. He presented to the emergency room on April 9 with complaints of hematuria. He was diagnosed with a UTI as he also had increased urinary frequency and urgency. His thiazide for hypertension was stopped. His INR was within therapeutic range at that time (2.17). Hgb was 10.8. He is unaware of which medications he is taking, and his pharmacy opens in approximately 40 minutes.  He continues to feel fatigued. He denies chest pain. He has shortness of breath when bending over to tie his shoes. Again, he says he sleeps 8 hours a night and wakes up feeling fatigued.  I do not have the results of the sleep study, but he believes he was not diagnosed with sleep apnea but was diagnosed with restless leg syndrome.  He had an episode of hematuria approximately 2 nights ago but it has since stopped.    No Known Allergies  Current Outpatient Prescriptions  Medication Sig Dispense Refill  . acetaminophen (TYLENOL) 500 MG tablet Take 500 mg by mouth every 6 (six) hours as needed. Pain      . amLODipine (NORVASC) 5 MG tablet Take 5 mg by mouth every morning.       Marland Kitchen aspirin EC 81 MG tablet Take 81 mg by mouth every morning.       . cephALEXin (KEFLEX) 500 MG capsule Take 1 capsule (500 mg total) by mouth 4 (four) times daily.  28 capsule  0  . cetirizine (ZYRTEC) 10 MG tablet Take 10 mg by mouth every morning.       Marland Kitchen losartan-hydrochlorothiazide (HYZAAR) 100-12.5 MG per tablet Take 1 tablet by mouth every morning.       . Misc Natural Products (OSTEO BI-FLEX ADV JOINT SHIELD) TABS Take 1 tablet by mouth every morning.      . Multiple Vitamins-Minerals (CENTRUM SILVER ADULT 50+) TABS Take 1 tablet by mouth every morning.      . pramipexole (MIRAPEX) 0.5 MG tablet Take 0.5 mg by mouth at bedtime.      Marland Kitchen warfarin (COUMADIN) 5 MG tablet Take 5 mg by  mouth See admin instructions. Takes two tablets at 7pm daily. Takes one and one-half tablet on Thursdays and Sundays.       No current facility-administered medications for this visit.    Past Medical History  Diagnosis Date  . Hypertension   . Coronary artery disease     Past Surgical History  Procedure Laterality Date  . Bilateral knee replacements       12 -15 yrs ago.  . Colonoscopy  07/04/2012    Procedure: COLONOSCOPY;  Surgeon: Rogene Houston, MD;  Location: [facility];  Service: Endoscopy;  Laterality: N/A;  320  . Esophagogastroduodenoscopy  07/04/2012    Procedure: ESOPHAGOGASTRODUODENOSCOPY (EGD);  Surgeon: Rogene Houston, MD;  Location: [facility];  Service: Endoscopy;  Laterality: N/A;    History   Social History  . Marital Status: Married    Spouse Name: N/A    Number of Children: N/A  . Years of Education: N/A   Occupational History  . Not on file.   Social History Main Topics  . Smoking status: Never Smoker   . Smokeless tobacco: Not on file  . Alcohol Use: No  . Drug Use: No  . Sexual Activity: Not on file   Other  Topics Concern  . Not on file   Social History Narrative  . No narrative on file   BP 148/60 Pulse 86    PHYSICAL EXAM General: NAD  Neck: No JVD, no thyromegaly or thyroid nodule.  Lungs: Clear to auscultation bilaterally with normal respiratory effort.  CV: Nondisplaced PMI. Heart irregular, normal rate, normal S1/S2, II/VI ejection systolic murmur at RUSB and II/VI apical holosystolic murmur. No peripheral edema. No carotid bruit. Normal pedal pulses.  Abdomen: Soft, nontender, no hepatosplenomegaly, no distention.  Skin: Intact without lesions or rashes.  Neurologic: Alert and oriented x 3.  Psych: Normal affect.  Extremities: No clubbing or cyanosis.  HEENT: Normal.    ECG: reviewed and available in electronic records.      ASSESSMENT AND PLAN: 1. Atrial fibrillation/flutter: Continue warfarin for  anticoagulation. Will stop ASA. Given the fact that his heart rate is controlled off of AV nodal blocking agents, he most certainly has conduction system disease. His CHADS-Vasc score is 3, thus warranting anticoagulation as he is at high risk for a CVA. 2. Hypertension: Reasonably controlled for age. 3. Fatigue: I will obtain the results of his sleep study. Given his significant fatigue over the past 8 months, I will proceed with a Lexiscan Cardiolite stress test to evaluate for occult ischemia. I will also check serum levels of TSH, free T4, vitamin D, and vitamin B 12. 4. Hematuria and recent UTI: d/c ASA.  Dispo: f/u 1 month.    Kate Sable, M.D., F.A.C.C.

## 2013-12-09 DIAGNOSIS — R3989 Other symptoms and signs involving the genitourinary system: Secondary | ICD-10-CM | POA: Diagnosis not present

## 2013-12-09 DIAGNOSIS — R310 Gross hematuria: Secondary | ICD-10-CM | POA: Diagnosis not present

## 2013-12-09 DIAGNOSIS — N200 Calculus of kidney: Secondary | ICD-10-CM | POA: Diagnosis not present

## 2013-12-10 DIAGNOSIS — R310 Gross hematuria: Secondary | ICD-10-CM | POA: Diagnosis not present

## 2013-12-10 DIAGNOSIS — N200 Calculus of kidney: Secondary | ICD-10-CM | POA: Diagnosis not present

## 2013-12-15 ENCOUNTER — Encounter (HOSPITAL_COMMUNITY)
Admission: RE | Admit: 2013-12-15 | Discharge: 2013-12-15 | Disposition: A | Discharge status: Home / Self Care | Payer: Medicare Other | Source: Ambulatory Visit | Attending: Cardiovascular Disease | Admitting: Cardiovascular Disease

## 2013-12-15 ENCOUNTER — Other Ambulatory Visit: Payer: Self-pay | Admitting: *Deleted

## 2013-12-15 ENCOUNTER — Encounter (HOSPITAL_COMMUNITY): Payer: Self-pay

## 2013-12-15 DIAGNOSIS — R0602 Shortness of breath: Secondary | ICD-10-CM | POA: Insufficient documentation

## 2013-12-15 DIAGNOSIS — I4891 Unspecified atrial fibrillation: Secondary | ICD-10-CM

## 2013-12-15 DIAGNOSIS — R5383 Other fatigue: Secondary | ICD-10-CM

## 2013-12-15 DIAGNOSIS — R5381 Other malaise: Secondary | ICD-10-CM | POA: Diagnosis not present

## 2013-12-15 DIAGNOSIS — E559 Vitamin D deficiency, unspecified: Secondary | ICD-10-CM

## 2013-12-15 DIAGNOSIS — E538 Deficiency of other specified B group vitamins: Secondary | ICD-10-CM

## 2013-12-15 MED ORDER — REGADENOSON 0.4 MG/5ML IV SOLN
INTRAVENOUS | Status: AC
  Administered 2013-12-15: 0.4 mg via INTRAVENOUS
  Filled 2013-12-15: qty 5

## 2013-12-15 MED ORDER — TECHNETIUM TC 99M SESTAMIBI - CARDIOLITE
30.0000 | Freq: Once | INTRAVENOUS | Status: AC | PRN
  Administered 2013-12-15: 30 via INTRAVENOUS

## 2013-12-15 MED ORDER — SODIUM CHLORIDE 0.9 % IJ SOLN
INTRAMUSCULAR | Status: AC
  Administered 2013-12-15: 10 mL via INTRAVENOUS
  Filled 2013-12-15: qty 10

## 2013-12-15 MED ORDER — TECHNETIUM TC 99M SESTAMIBI GENERIC - CARDIOLITE
10.0000 | Freq: Once | INTRAVENOUS | Status: AC | PRN
  Administered 2013-12-15: 10 via INTRAVENOUS

## 2013-12-15 NOTE — Progress Notes (Signed)
Stress Lab Nurses Notes - [name]  Manuel Le 12/15/2013 Reason for doing test: AFib, SOB & Fatigue Type of test: Wille Glaser Nurse performing test: Gerrit Halls, RN Nuclear Medicine Tech: Redmond Baseman Echo Tech: Not Applicable MD performing test: Koneswaran/K.Purcell Nails NP Family MD: Dr. [name] Test explained and consent signed: yes IV started: 22g jelco, Saline lock flushed, No redness or edema and Saline lock started in radiology Symptoms: stomach discomfort Treatment/Intervention: None Reason test stopped: protocol completed After recovery IV was: Discontinued via X-ray tech and No redness or edema Patient to return to [place]. Med at : 12:05 Patient discharged: Home Patient's Condition upon discharge was: stable Comments: During test BP 127/65 & HR 85.  Recovery BP 148/61 & HR 80. Symptoms resolved in recovery.  Manuel Le

## 2013-12-18 ENCOUNTER — Telehealth: Payer: Self-pay | Admitting: *Deleted

## 2013-12-18 NOTE — Telephone Encounter (Signed)
Message copied by Laurine Blazer on Thu Dec 18, 2013  2:47 PM ------      Message from: Kate Sable A      Created: Tue Dec 16, 2013 12:12 PM       Yes.            ----- Message -----         From: Laurine Blazer, LPN         Sent: 5/59/7416  11:08 AM           To: Herminio Commons, MD            Follow up is scheduled for 01/02/2014.  Is this okay?       ------

## 2013-12-18 NOTE — Telephone Encounter (Signed)
Notes Recorded by Laurine Blazer, LPN on 5/80/9983 at 3:82 PM Patient notified and verbalized understanding. Did not get any of the labs requested at last visit. He will discuss with MD at follow up.  ---------------------------------------  Notes Recorded by Herminio Commons, MD on 12/16/2013 at 8:42 AM Abnormal results, which I will discuss at f/u visit.

## 2013-12-22 ENCOUNTER — Other Ambulatory Visit: Payer: Self-pay | Admitting: Urology

## 2013-12-22 DIAGNOSIS — R3989 Other symptoms and signs involving the genitourinary system: Secondary | ICD-10-CM | POA: Diagnosis not present

## 2013-12-22 DIAGNOSIS — R310 Gross hematuria: Secondary | ICD-10-CM | POA: Diagnosis not present

## 2013-12-22 DIAGNOSIS — N200 Calculus of kidney: Secondary | ICD-10-CM | POA: Diagnosis not present

## 2013-12-22 DIAGNOSIS — C679 Malignant neoplasm of bladder, unspecified: Secondary | ICD-10-CM | POA: Diagnosis not present

## 2013-12-24 ENCOUNTER — Other Ambulatory Visit: Payer: Self-pay | Admitting: *Deleted

## 2013-12-24 ENCOUNTER — Ambulatory Visit (INDEPENDENT_AMBULATORY_CARE_PROVIDER_SITE_OTHER): Payer: Medicare Other | Admitting: *Deleted

## 2013-12-24 DIAGNOSIS — Z5181 Encounter for therapeutic drug level monitoring: Secondary | ICD-10-CM

## 2013-12-24 DIAGNOSIS — I4891 Unspecified atrial fibrillation: Secondary | ICD-10-CM | POA: Diagnosis not present

## 2013-12-24 LAB — POCT INR: INR: 1

## 2013-12-25 ENCOUNTER — Encounter (HOSPITAL_BASED_OUTPATIENT_CLINIC_OR_DEPARTMENT_OTHER): Payer: Self-pay | Admitting: *Deleted

## 2013-12-26 ENCOUNTER — Encounter: Payer: Self-pay | Admitting: Cardiovascular Disease

## 2013-12-26 ENCOUNTER — Other Ambulatory Visit: Payer: Self-pay | Admitting: *Deleted

## 2013-12-26 ENCOUNTER — Ambulatory Visit (INDEPENDENT_AMBULATORY_CARE_PROVIDER_SITE_OTHER): Payer: Medicare Other | Admitting: Cardiovascular Disease

## 2013-12-26 VITALS — BP 162/72 | HR 78 | Ht 72.0 in | Wt 201.0 lb

## 2013-12-26 DIAGNOSIS — C679 Malignant neoplasm of bladder, unspecified: Secondary | ICD-10-CM

## 2013-12-26 DIAGNOSIS — R931 Abnormal findings on diagnostic imaging of heart and coronary circulation: Secondary | ICD-10-CM

## 2013-12-26 DIAGNOSIS — I4891 Unspecified atrial fibrillation: Secondary | ICD-10-CM

## 2013-12-26 DIAGNOSIS — R9439 Abnormal result of other cardiovascular function study: Secondary | ICD-10-CM | POA: Diagnosis not present

## 2013-12-26 DIAGNOSIS — I2510 Atherosclerotic heart disease of native coronary artery without angina pectoris: Secondary | ICD-10-CM | POA: Diagnosis not present

## 2013-12-26 DIAGNOSIS — I10 Essential (primary) hypertension: Secondary | ICD-10-CM | POA: Diagnosis not present

## 2013-12-26 DIAGNOSIS — R5381 Other malaise: Secondary | ICD-10-CM

## 2013-12-26 DIAGNOSIS — R5383 Other fatigue: Secondary | ICD-10-CM

## 2013-12-26 DIAGNOSIS — Z01818 Encounter for other preprocedural examination: Secondary | ICD-10-CM

## 2013-12-26 NOTE — Patient Instructions (Signed)
Continue all other medications.   Follow up in  4-6 weeks

## 2013-12-26 NOTE — Progress Notes (Signed)
Patient ID: Manuel Le, male   DOB: Jul 03, 1927, 80 y.o.   MRN: [id number]      SUBJECTIVE: The patient is here to followup for fatigue, atrial fibrillation, and hypertension. Lexiscan Cardiolite demonstrated mild to moderate degree of peri-infarct ischemia seen in the mid inferior wall, mid inferoseptal wall, mid inferolateral wall, and basal inferior wall. Echocardiography in January 2015 demonstrated normal left ventricular systolic function, EF 01-02%. He had previous problems with hematuria and a UTI and aspirin was discontinued.  A CT urogram in April 2015 demonstrated several enhancing bladder lesions, with formed clot in the bladder. Cystoscopy in May revealed a large dome tumor. Since these findings, warfarin has been held. He is being scheduled for a cystoscopy with bilateral retrograde pyelograms and transurethral resection of a bladder tumor pending cardiac clearance.  Since stopping warfarin, he has had no more hematuria. He denies chest pain and shortness of breath. However, he continues to feel fatigued with minimal exertion.    No Known Allergies  Current Outpatient Prescriptions  Medication Sig Dispense Refill  . acetaminophen (TYLENOL) 500 MG tablet Take 500 mg by mouth every 6 (six) hours as needed. Pain      . amLODipine (NORVASC) 5 MG tablet Take 5 mg by mouth every morning.       . cetirizine (ZYRTEC) 10 MG tablet Take 10 mg by mouth every morning.       Marland Kitchen losartan-hydrochlorothiazide (HYZAAR) 100-12.5 MG per tablet Take 1 tablet by mouth every morning.       . Misc Natural Products (OSTEO BI-FLEX ADV JOINT SHIELD) TABS Take 1 tablet by mouth every morning.      . Multiple Vitamins-Minerals (CENTRUM SILVER ADULT 50+) TABS Take 1 tablet by mouth every morning.      . pramipexole (MIRAPEX) 0.5 MG tablet Take 0.5 mg by mouth at bedtime.      Marland Kitchen warfarin (COUMADIN) 5 MG tablet Take 5 mg by mouth See admin instructions. Takes two tablets at 7pm daily. Takes one and  one-half tablet on Thursdays and Sundays.       No current facility-administered medications for this visit.    Past Medical History  Diagnosis Date  . Hypertension   . Coronary artery disease   . Mild obstructive sleep apnea   . Iron deficiency anemia   . Bladder cancer   . Hematuria   . Atrial fibrillation and flutter     Past Surgical History  Procedure Laterality Date  . Colonoscopy  07/04/2012    Procedure: COLONOSCOPY;  Surgeon: Rogene Houston, MD;  [facility];  Service: Endoscopy;  Laterality: N/A;  320  . Esophagogastroduodenoscopy  07/04/2012    Procedure: ESOPHAGOGASTRODUODENOSCOPY (EGD);  Surgeon: Rogene Houston, MD;  Location: [facility];  Service: Endoscopy;  Laterality: N/A;  . Cardiovascular stress test  12-15-2013  DR Kate Sable ([place])    MILD - MODERATE PERI-INFARCT ISCHEMIA  OF INFERIOR WALL, MID-INFEROSEPTAL WALL, MID-INFEROLATERAL WALL, & BASAL INFERIOR WALL/   NORMAL LVF /  EF 59%/  INTERMEDIATE RISK STUDY  . Transthoracic echocardiogram  09-11-2013    MODERATE LVH/  EF 55-60%/  MILD MR & TR/  MILD TO MODERATE CALCIFIED AV WITHOUT STENOSIS/  MILD LAE/   MODERATE PR  . Total knee arthroplasty Bilateral 2000  &  2003?    History   Social History  . Marital Status: Married    Spouse Name: N/A    Number of Children: N/A  .  Years of Education: N/A   Occupational History  . Not on file.   Social History Main Topics  . Smoking status: Never Smoker   . Smokeless tobacco: Not on file  . Alcohol Use: No  . Drug Use: No  . Sexual Activity: Not on file   Other Topics Concern  . Not on file   Social History Narrative  . No narrative on file     Filed Vitals:   12/26/13 1116  BP: 162/72  Pulse: 78  Height: 6' (1.829 m)  Weight: 201 lb (91.173 kg)    PHYSICAL EXAM General: NAD  Neck: No JVD, no thyromegaly or thyroid nodule.  Lungs: Clear to auscultation bilaterally with normal respiratory effort.  CV:  Nondisplaced PMI. Heart irregular, normal rate, normal S1/S2, II/VI ejection systolic murmur at RUSB and II/VI apical holosystolic murmur. No peripheral edema. No carotid bruit. Normal pedal pulses.  Abdomen: Soft, nontender, no hepatosplenomegaly, no distention.  Skin: Intact without lesions or rashes.  Neurologic: Alert and oriented x 3.  Psych: Normal affect.  Extremities: No clubbing or cyanosis.  HEENT: Normal.    ECG: reviewed and available in electronic records.  Lexiscan 1. Abnormal Lexiscan Cardiolite stress test.  2. Mild to moderate degree of peri-infarct ischemia seen in the mid inferior wall, mid inferoseptal wall, mid inferolateral wall, and basal inferior wall.  3. Normal left ventricular systolic function, calculated LVEF 59%.  4. This is an intermediate risk study for future major adverse cardiac events.       ASSESSMENT AND PLAN: 1. Atrial fibrillation/flutter: Warfarin has been held and ASA was previously d/c, as he has been diagnosed with bladder cancer. Given the fact that his heart rate is controlled off of AV nodal blocking agents, he most certainly has conduction system disease. His CHADS-Vasc score is 3, thus warranting anticoagulation as he is at high risk for a CVA. In addition to this, malignancy is associated with a hypercoagulable state. However, given the aforementioned diagnosis of bladder cancer with associated hematuria and the need for surgery, this will need to be held. The patient is understanding of the risks for thromboembolic complications (CVA, DVT/PE). 2. Hypertension: Mildly elevated today. Will need to have good control in the perioperative period to minimize the risks of a major adverse cardiac events.  3. Fatigue: Lexiscan Cardiolite stress test demonstrative of mild to moderate peri-infarct ischemia. His fatigue may be secondary to ischemic heart disease, or due to bladder cancer, or a combination of both. See discussion below for further  planning. 4. Bladder cancer with surgery planned for May 13/preoperative risk stratification: This is a difficult situation. Given his recent diagnosis of bladder cancer, he requires surgery. Warfarin has been held as well as other medications with the exception of his antihypertensive. As stated above, he has an intermediate risk nuclear stress test demonstrating mild to moderate peri-infarct ischemia. If I were to pursue cardiac catheterization and percutaneous coronary intervention is required, it will necessitate being on dual antiplatelet therapy for a minimum of 3 months (assuming bare metal stenting). However, coronary revascularization prior to surgery will not affect perioperative outcomes (see CARP trial, NEJM). His normal LV systolic function is also reassuring. For this reason, I recommend proceeding with bladder tumor resection. I will see him in 4-6 weeks. At that time, I can determine the best timing with respect to pursuing cardiac catheterization and coronary angiography. I would recommend the use of IV metoprolol during the operative period to help mitigate his cardiovascular risk,  with good BP and HR control.  Dispo: f/u 4-6 weeks.  Kate Sable, M.D., F.A.C.C.

## 2013-12-29 NOTE — Progress Notes (Signed)
REVIEWED CHART W/ DR GERMEROTH MDA, STATES PT NOT APPROPRIATE FOR OUR FACILITY DUE TO ISCHEMIA ON STRESS TEST AND AT INTERMEDIATE RISK, SHOULD BE DONE AT MAIN OR. INFORMED DR Tresa Moore BY LEAVING MESSAGE  FOR SELITA, OR SCHEDULER FOR DR [facility].

## 2013-12-30 ENCOUNTER — Other Ambulatory Visit: Payer: Self-pay | Admitting: Urology

## 2013-12-30 ENCOUNTER — Encounter (HOSPITAL_BASED_OUTPATIENT_CLINIC_OR_DEPARTMENT_OTHER): Payer: Self-pay | Admitting: *Deleted

## 2013-12-30 MED ORDER — GENTAMICIN SULFATE 40 MG/ML IJ SOLN
5.0000 mg/kg | INTRAVENOUS | Status: AC
  Administered 2013-12-31: 460 mg via INTRAVENOUS
  Filled 2013-12-30 (×2): qty 11.5

## 2013-12-30 NOTE — Progress Notes (Signed)
eccho 1/15, holter report, nuclear stress test 4/14, LOv Dr Jacinta Shoe 01/05/14 EPIC,  Requested resting ekg tracing for [facility] at [phone]

## 2013-12-30 NOTE — Progress Notes (Signed)
Unable to locate resting ekg from Nuclear stress test from 4/15- called facility and [facility] medical records.  Will be repeated morning of surgery

## 2013-12-31 ENCOUNTER — Encounter (HOSPITAL_COMMUNITY)
Admission: RE | Disposition: A | Discharge status: Home / Self Care | Payer: Self-pay | Source: Ambulatory Visit | Attending: Urology

## 2013-12-31 ENCOUNTER — Encounter (HOSPITAL_COMMUNITY): Payer: Medicare Other | Admitting: Anesthesiology

## 2013-12-31 ENCOUNTER — Ambulatory Visit (HOSPITAL_COMMUNITY): Payer: Medicare Other | Admitting: Anesthesiology

## 2013-12-31 ENCOUNTER — Ambulatory Visit (HOSPITAL_BASED_OUTPATIENT_CLINIC_OR_DEPARTMENT_OTHER)
Admission: RE | Admit: 2013-12-31 | Discharge: 2013-12-31 | Disposition: A | Discharge status: Home / Self Care | Payer: Medicare Other | Source: Ambulatory Visit | Attending: Urology | Admitting: Urology

## 2013-12-31 ENCOUNTER — Encounter (HOSPITAL_COMMUNITY): Payer: Self-pay | Admitting: *Deleted

## 2013-12-31 ENCOUNTER — Ambulatory Visit (HOSPITAL_COMMUNITY): Payer: Medicare Other

## 2013-12-31 DIAGNOSIS — C679 Malignant neoplasm of bladder, unspecified: Secondary | ICD-10-CM | POA: Diagnosis not present

## 2013-12-31 DIAGNOSIS — I10 Essential (primary) hypertension: Secondary | ICD-10-CM | POA: Diagnosis not present

## 2013-12-31 DIAGNOSIS — I4891 Unspecified atrial fibrillation: Secondary | ICD-10-CM | POA: Insufficient documentation

## 2013-12-31 DIAGNOSIS — D509 Iron deficiency anemia, unspecified: Secondary | ICD-10-CM | POA: Diagnosis not present

## 2013-12-31 DIAGNOSIS — I2510 Atherosclerotic heart disease of native coronary artery without angina pectoris: Secondary | ICD-10-CM | POA: Diagnosis not present

## 2013-12-31 DIAGNOSIS — C671 Malignant neoplasm of dome of bladder: Secondary | ICD-10-CM | POA: Insufficient documentation

## 2013-12-31 DIAGNOSIS — N200 Calculus of kidney: Secondary | ICD-10-CM | POA: Insufficient documentation

## 2013-12-31 DIAGNOSIS — G4733 Obstructive sleep apnea (adult) (pediatric): Secondary | ICD-10-CM | POA: Diagnosis not present

## 2013-12-31 DIAGNOSIS — Z01818 Encounter for other preprocedural examination: Secondary | ICD-10-CM | POA: Diagnosis not present

## 2013-12-31 DIAGNOSIS — Z7901 Long term (current) use of anticoagulants: Secondary | ICD-10-CM | POA: Diagnosis not present

## 2013-12-31 DIAGNOSIS — R310 Gross hematuria: Secondary | ICD-10-CM | POA: Diagnosis not present

## 2013-12-31 DIAGNOSIS — Z79899 Other long term (current) drug therapy: Secondary | ICD-10-CM | POA: Insufficient documentation

## 2013-12-31 HISTORY — PX: TRANSURETHRAL RESECTION OF BLADDER TUMOR WITH GYRUS (TURBT-GYRUS): SHX6458

## 2013-12-31 HISTORY — DX: Unspecified atrial flutter: I48.92

## 2013-12-31 HISTORY — DX: Iron deficiency anemia, unspecified: D50.9

## 2013-12-31 HISTORY — DX: Unspecified atrial fibrillation: I48.91

## 2013-12-31 HISTORY — DX: Hematuria, unspecified: R31.9

## 2013-12-31 HISTORY — PX: CYSTOSCOPY W/ RETROGRADES: SHX1426

## 2013-12-31 HISTORY — DX: Malignant neoplasm of bladder, unspecified: C67.9

## 2013-12-31 HISTORY — DX: Obstructive sleep apnea (adult) (pediatric): G47.33

## 2013-12-31 LAB — BASIC METABOLIC PANEL
BUN: 19 mg/dL (ref 6–23)
CHLORIDE: 102 meq/L (ref 96–112)
CO2: 25 meq/L (ref 19–32)
Calcium: 9.3 mg/dL (ref 8.4–10.5)
Creatinine, Ser: 0.9 mg/dL (ref 0.50–1.35)
GFR calc Af Amer: 88 mL/min — ABNORMAL LOW (ref >90)
GFR, EST NON AFRICAN AMERICAN: 76 mL/min — AB (ref >90)
GLUCOSE: 112 mg/dL — AB (ref 70–99)
POTASSIUM: 4.1 meq/L (ref 3.7–5.3)
Sodium: 141 mEq/L (ref 137–147)

## 2013-12-31 LAB — CBC
HEMATOCRIT: 27.2 % — AB (ref 39.0–52.0)
HEMOGLOBIN: 8.7 g/dL — AB (ref 13.0–17.0)
MCH: 26.2 pg (ref 26.0–34.0)
MCHC: 32 g/dL (ref 30.0–36.0)
MCV: 81.9 fL (ref 78.0–100.0)
Platelets: 209 10*3/uL (ref 150–400)
RBC: 3.32 MIL/uL — ABNORMAL LOW (ref 4.22–5.81)
RDW: 13.2 % (ref 11.5–15.5)
WBC: 10.2 10*3/uL (ref 4.0–10.5)

## 2013-12-31 LAB — PROTIME-INR
INR: 1.05 (ref 0.00–1.49)
Prothrombin Time: 13.5 seconds (ref 11.6–15.2)

## 2013-12-31 SURGERY — TRANSURETHRAL RESECTION OF BLADDER TUMOR WITH GYRUS (TURBT-GYRUS)
Anesthesia: General

## 2013-12-31 MED ORDER — SENNOSIDES-DOCUSATE SODIUM 8.6-50 MG PO TABS: 1.0000 | ORAL_TABLET | Freq: Two times a day (BID) | ORAL | Status: DC

## 2013-12-31 MED ORDER — METOCLOPRAMIDE HCL 5 MG/ML IJ SOLN
INTRAMUSCULAR | Status: AC
  Filled 2013-12-31: qty 2

## 2013-12-31 MED ORDER — LACTATED RINGERS IV SOLN
INTRAVENOUS | Status: DC
  Administered 2013-12-31: 1000 mL via INTRAVENOUS

## 2013-12-31 MED ORDER — DEXAMETHASONE SODIUM PHOSPHATE 4 MG/ML IJ SOLN
INTRAMUSCULAR | Status: DC | PRN
  Administered 2013-12-31: 10 mg via INTRAVENOUS

## 2013-12-31 MED ORDER — ESMOLOL HCL 10 MG/ML IV SOLN
INTRAVENOUS | Status: DC | PRN
  Administered 2013-12-31: 20 mg via INTRAVENOUS

## 2013-12-31 MED ORDER — PROPOFOL 10 MG/ML IV BOLUS
INTRAVENOUS | Status: AC
  Filled 2013-12-31: qty 20

## 2013-12-31 MED ORDER — PHENYLEPHRINE HCL 10 MG/ML IJ SOLN
INTRAMUSCULAR | Status: AC
  Filled 2013-12-31: qty 1

## 2013-12-31 MED ORDER — FENTANYL CITRATE 0.05 MG/ML IJ SOLN
INTRAMUSCULAR | Status: AC
  Filled 2013-12-31: qty 2

## 2013-12-31 MED ORDER — STERILE WATER FOR IRRIGATION IR SOLN
Status: DC | PRN
  Administered 2013-12-31: 500 mL

## 2013-12-31 MED ORDER — METOCLOPRAMIDE HCL 5 MG/ML IJ SOLN
INTRAMUSCULAR | Status: DC | PRN
  Administered 2013-12-31: 10 mg via INTRAVENOUS

## 2013-12-31 MED ORDER — FENTANYL CITRATE 0.05 MG/ML IJ SOLN
INTRAMUSCULAR | Status: DC | PRN
  Administered 2013-12-31: 25 ug via INTRAVENOUS
  Administered 2013-12-31 (×3): 50 ug via INTRAVENOUS
  Administered 2013-12-31: 25 ug via INTRAVENOUS

## 2013-12-31 MED ORDER — LIDOCAINE HCL (CARDIAC) 20 MG/ML IV SOLN
INTRAVENOUS | Status: DC | PRN
  Administered 2013-12-31: 100 mg via INTRAVENOUS

## 2013-12-31 MED ORDER — ESMOLOL HCL 10 MG/ML IV SOLN
INTRAVENOUS | Status: AC
  Filled 2013-12-31: qty 10

## 2013-12-31 MED ORDER — ONDANSETRON HCL 4 MG/2ML IJ SOLN
INTRAMUSCULAR | Status: DC | PRN
  Administered 2013-12-31: 4 mg via INTRAVENOUS

## 2013-12-31 MED ORDER — CEPHALEXIN 500 MG PO CAPS: 500.0000 mg | ORAL_CAPSULE | Freq: Two times a day (BID) | ORAL | Status: DC

## 2013-12-31 MED ORDER — PROPOFOL 10 MG/ML IV BOLUS
INTRAVENOUS | Status: DC | PRN
  Administered 2013-12-31: 140 mg via INTRAVENOUS
  Administered 2013-12-31 (×2): 30 mg via INTRAVENOUS
  Administered 2013-12-31: 40 mg via INTRAVENOUS

## 2013-12-31 MED ORDER — TRAMADOL HCL 50 MG PO TABS: 50.0000 mg | ORAL_TABLET | Freq: Four times a day (QID) | ORAL | Status: DC | PRN

## 2013-12-31 MED ORDER — PHENYLEPHRINE HCL 10 MG/ML IJ SOLN
10.0000 mg | INTRAVENOUS | Status: DC | PRN
  Administered 2013-12-31: 50 ug/min via INTRAVENOUS

## 2013-12-31 MED ORDER — PHENYLEPHRINE HCL 10 MG/ML IJ SOLN
INTRAMUSCULAR | Status: DC | PRN
  Administered 2013-12-31 (×2): 80 ug via INTRAVENOUS
  Administered 2013-12-31: 120 ug via INTRAVENOUS
  Administered 2013-12-31: 40 ug via INTRAVENOUS
  Administered 2013-12-31: 80 ug via INTRAVENOUS

## 2013-12-31 MED ORDER — FENTANYL CITRATE 0.05 MG/ML IJ SOLN
25.0000 ug | INTRAMUSCULAR | Status: DC | PRN
  Administered 2013-12-31 (×4): 25 ug via INTRAVENOUS

## 2013-12-31 MED ORDER — ONDANSETRON HCL 4 MG/2ML IJ SOLN
INTRAMUSCULAR | Status: AC
  Filled 2013-12-31: qty 2

## 2013-12-31 MED ORDER — SODIUM CHLORIDE 0.9 % IR SOLN
Status: DC | PRN
  Administered 2013-12-31: 9000 mL

## 2013-12-31 MED ORDER — PHENYLEPHRINE 40 MCG/ML (10ML) SYRINGE FOR IV PUSH (FOR BLOOD PRESSURE SUPPORT)
PREFILLED_SYRINGE | INTRAVENOUS | Status: AC
  Filled 2013-12-31: qty 10

## 2013-12-31 MED ORDER — GENTAMICIN IN SALINE 1.6-0.9 MG/ML-% IV SOLN: 80.0000 mg | INTRAVENOUS | Status: DC

## 2013-12-31 MED ORDER — 0.9 % SODIUM CHLORIDE (POUR BTL) OPTIME
TOPICAL | Status: DC | PRN
  Administered 2013-12-31: 1000 mL

## 2013-12-31 MED ORDER — DEXAMETHASONE SODIUM PHOSPHATE 10 MG/ML IJ SOLN
INTRAMUSCULAR | Status: AC
  Filled 2013-12-31: qty 1

## 2013-12-31 SURGICAL SUPPLY — 23 items
BAG URINE DRAINAGE (UROLOGICAL SUPPLIES) IMPLANT
BAG URO CATCHER STRL LF (DRAPE) ×3 IMPLANT
CATH FOLEY 3WAY 30CC 22FR (CATHETERS) IMPLANT
CATH FOLEY 3WAY 30CC 24FR (CATHETERS) ×1
CATH URTH STD 24FR FL 3W 2 (CATHETERS) ×2 IMPLANT
DRAPE CAMERA CLOSED 9X96 (DRAPES) ×3 IMPLANT
ELECT BUTTON HF 24-28F 2 30DE (ELECTRODE) IMPLANT
ELECT LOOP MED HF 24F 12D (CUTTING LOOP) ×3 IMPLANT
ELECT LOOP MED HF 24F 12D CBL (CLIP) IMPLANT
ELECT RESECT VAPORIZE 12D CBL (ELECTRODE) IMPLANT
GLOVE BIOGEL M STRL SZ7.5 (GLOVE) ×3 IMPLANT
GOWN STRL REUS W/TWL XL LVL3 (GOWN DISPOSABLE) ×6 IMPLANT
GUIDEWIRE STR DUAL SENSOR (WIRE) ×3 IMPLANT
HOLDER FOLEY CATH W/STRAP (MISCELLANEOUS) IMPLANT
IV NS IRRIG 3000ML ARTHROMATIC (IV SOLUTION) IMPLANT
KIT ASPIRATION TUBING (SET/KITS/TRAYS/PACK) IMPLANT
MANIFOLD NEPTUNE II (INSTRUMENTS) ×3 IMPLANT
PACK CYSTO (CUSTOM PROCEDURE TRAY) ×3 IMPLANT
PLUG CATH AND CAP STER (CATHETERS) ×3 IMPLANT
SUT ETHILON 3 0 PS 1 (SUTURE) IMPLANT
SYR 30ML LL (SYRINGE) IMPLANT
SYRINGE IRR TOOMEY STRL 70CC (SYRINGE) IMPLANT
TUBING CONNECTING 10 (TUBING) ×3 IMPLANT

## 2013-12-31 NOTE — Anesthesia Postprocedure Evaluation (Signed)
  Anesthesia Post-op Note  Patient: Manuel Le  Procedure(s) Performed: Procedure(s) (LRB): TRANSURETHRAL RESECTION OF BLADDER TUMOR WITH GYRUS (TURBT-GYRUS) (N/A) CYSTOSCOPY WITH RETROGRADE PYELOGRAM (Bilateral)  Patient Location: PACU  Anesthesia Type: General  Level of Consciousness: awake and alert   Airway and Oxygen Therapy: Patient Spontanous Breathing  Post-op Pain: mild  Post-op Assessment: Post-op Vital signs reviewed, Patient's Cardiovascular Status Stable, Respiratory Function Stable, Patent Airway and No signs of Nausea or vomiting  Last Vitals:  Filed Vitals:   12/31/13 1300  BP: 144/63  Pulse: 73  Temp: 36.3 C  Resp: 16    Post-op Vital Signs: stable   Complications: No apparent anesthesia complications

## 2013-12-31 NOTE — H&P (Signed)
Manuel Le is an 80 y.o. male.    Chief Complaint: Pre-op Transurethral Resection of Bladder Tumor  HPI:    1 - Gross Hematuria - New painless hematuria 11/2013 on / off x several, some with clots. UCX at time negative. Non smoker. CT Urogram 11/2013 with several enhancing bladder lesions, formed clot in bladder. Cysto 12/2013 with large dome tumor.  2 - Lower Urinary Tract Symptoms - Pt with worsened obstructive and irritative urinary symptoms correlated with hematuria. No baseline bother whatsoever. DRE 45gm smooth 11/2013.   3 - Nephrolithiasis -  Pre 2015 - passage with medical therapy x 4-5, none in past 10 years, no surgeries. CT Urogram 11/2013 with scattered punctate only, no hydro.  4 - Bladder Cancer - New DX by Cysto / CT 12/2013. CT clinically localized.   PMH sig for AFib/coumadin (no CVA or MI), knee replacement, HTN.   Today Manuel Le is seen to proceed with transuretrhal resection of bladder tumor for initial diagnosis, staging, and treatment of his newly diagnosed bladder cancer. He had been off coumadin as instructed.    Past Medical History  Diagnosis Date  . Hypertension   . Coronary artery disease   . Iron deficiency anemia   . Bladder cancer   . Hematuria   . Atrial fibrillation and flutter   . Mild obstructive sleep apnea     no cpap    Past Surgical History  Procedure Laterality Date  . Colonoscopy  07/04/2012    Procedure: COLONOSCOPY;  Surgeon: Manuel Houston, MD;  [facility];  Service: Endoscopy;  Laterality: N/A;  320  . Esophagogastroduodenoscopy  07/04/2012    Procedure: ESOPHAGOGASTRODUODENOSCOPY (EGD);  Surgeon: Manuel Houston, MD;  [facility]: [facility];  Service: Endoscopy;  Laterality: N/A;  . Cardiovascular stress test  12-15-2013  DR Kate Sable ([place])    MILD - MODERATE PERI-INFARCT ISCHEMIA  OF INFERIOR WALL, MID-INFEROSEPTAL WALL, MID-INFEROLATERAL WALL, & BASAL INFERIOR WALL/   NORMAL LVF /  EF 59%/   INTERMEDIATE RISK STUDY  . Transthoracic echocardiogram  09-11-2013    MODERATE LVH/  EF 55-60%/  MILD MR & TR/  MILD TO MODERATE CALCIFIED AV WITHOUT STENOSIS/  MILD LAE/   MODERATE PR  . Total knee arthroplasty Bilateral 2000  &  2003?  Marland Kitchen Hernia repair Right     History reviewed. No pertinent family history. Social History:  reports that he has never smoked. He has never used smokeless tobacco. He reports that he does not drink alcohol or use illicit drugs.  Allergies: No Known Allergies  Medications Prior to Admission  Medication Sig Dispense Refill  . acetaminophen (TYLENOL) 500 MG tablet Take 500 mg by mouth every 6 (six) hours as needed. Pain      . amLODipine (NORVASC) 5 MG tablet Take 5 mg by mouth every morning.       Marland Kitchen losartan-hydrochlorothiazide (HYZAAR) 100-12.5 MG per tablet Take 1 tablet by mouth every morning.       . cetirizine (ZYRTEC) 10 MG tablet Take 10 mg by mouth every morning.       . Misc Natural Products (OSTEO BI-FLEX ADV JOINT SHIELD) TABS Take 1 tablet by mouth every morning.      . Multiple Vitamins-Minerals (CENTRUM SILVER ADULT 50+) TABS Take 1 tablet by mouth every morning.      . pramipexole (MIRAPEX) 0.5 MG tablet Take 0.5 mg by mouth at bedtime.      Marland Kitchen warfarin (COUMADIN)  5 MG tablet Take 5 mg by mouth See admin instructions. Takes two tablets at 7pm daily. Takes one and one-half tablet on Thursdays and Sundays.        No results found for this or any previous visit (from the past 48 hour(s)). No results found.  Review of Systems  Constitutional: Negative.  Negative for fever and chills.  HENT: Negative.   Eyes: Negative.   Respiratory: Negative.   Cardiovascular: Negative.   Gastrointestinal: Negative.   Genitourinary: Negative.   Musculoskeletal: Negative.   Skin: Negative.   Neurological: Negative.   Endo/Heme/Allergies: Negative.   Psychiatric/Behavioral: Negative.     Height 6' (1.829 m), weight 91.173 kg (201 lb). Physical Exam   Constitutional: He is oriented to person, place, and time. He appears well-developed and well-nourished.  Vigorous for age  HENT:  Head: Normocephalic and atraumatic.  Eyes: Pupils are equal, round, and reactive to light.  Neck: Normal range of motion. Neck supple.  Cardiovascular: Normal rate.   Respiratory: Effort normal.  GI: Soft. Bowel sounds are normal.  Genitourinary: Penis normal.  Musculoskeletal: Normal range of motion.  Neurological: He is alert and oriented to person, place, and time.  Skin: Skin is warm and dry.  Psychiatric: He has a normal mood and affect. His behavior is normal. Judgment and thought content normal.     Assessment/Plan   1 - Gross Hematuria - Likely 2/2 localized bladder cancer as per below. Tiny volume renal stones UNlikley significant contributer.   2 - Lower Urinary Tract Symptoms - hematuria w/u to r/o bladder mass as correlated time wise with hematuria. NO new meds as no baseline bother.   3 - Nephrolithiasis -  NO sig stones by recent CT.   4 - Bladder Cancer - Large dome tumor, clinically localized.  We rediscussed operative biopsy / transurethral resection as the best next step for diagnostic and therapeutic purposes with goals being to remove all visible cancer and obtain tissue for pathologic exam. We rediscussed that for some low-grade tumors, this may be all the treatment required, but that for many other tumors such as high-grade lesions, further therapy including surgery and or chemotherapy may be warranted. We also outlined the fact that any bladder cancer diagnosis will require close follow-up with periodic upper and lower tract evaluation. We rediscussed risks including bleeding, infection, damage to kidney / ureter / bladder including bladder perforation which can typically managed with prolonged foley catheterization. We rementioned anesthetic and other rare risks including DVT, PE, MI, and mortality. I also mentioned that adjunctive  procedures such as ureteral stenting, retrograde pyelography, and ureteroscopy may be necessary to fully evaluate the urinary tract depending on intra-operative findings. After answering all questions to the patient's satisfaction, they wish to proceed today as planned.     Alexis Frock 12/31/2013, 8:29 AM

## 2013-12-31 NOTE — Anesthesia Preprocedure Evaluation (Addendum)
Anesthesia Evaluation  Patient identified by MRN, date of birth, ID band Patient awake    Reviewed: Allergy & Precautions, H&P , NPO status , Patient's Chart, lab work & pertinent test results  Airway Mallampati: II TM Distance: >3 FB Neck ROM: Full    Dental no notable dental hx.    Pulmonary sleep apnea ,  breath sounds clear to auscultation  Pulmonary exam normal       Cardiovascular hypertension, Pt. on medications + CAD + dysrhythmias Atrial Fibrillation Rhythm:Regular Rate:Normal  Recent cardiolite is intermediate risk study.  Recent cardiology office note 12-26-13 reviewed.   Neuro/Psych negative neurological ROS  negative psych ROS   GI/Hepatic negative GI ROS, Neg liver ROS,   Endo/Other  negative endocrine ROS  Renal/GU negative Renal ROS  negative genitourinary   Musculoskeletal negative musculoskeletal ROS (+)   Abdominal   Peds negative pediatric ROS (+)  Hematology  (+) anemia ,   Anesthesia Other Findings   Reproductive/Obstetrics negative OB ROS                          Anesthesia Physical Anesthesia Plan  ASA: III  Anesthesia Plan: General   Post-op Pain Management:    Induction: Intravenous  Airway Management Planned: LMA  Additional Equipment:   Intra-op Plan:   Post-operative Plan: Extubation in OR  Informed Consent: I have reviewed the patients History and Physical, chart, labs and discussed the procedure including the risks, benefits and alternatives for the proposed anesthesia with the patient or authorized representative who has indicated his/her understanding and acceptance.   Dental advisory given  Plan Discussed with: CRNA  Anesthesia Plan Comments: (Patient understands he is at increased risk for MI, stroke. Discussed with Dr. Tresa Moore)       Anesthesia Quick Evaluation

## 2013-12-31 NOTE — Transfer of Care (Signed)
Immediate Anesthesia Transfer of Care Note  Patient: Manuel Le  Procedure(s) Performed: Procedure(s): TRANSURETHRAL RESECTION OF BLADDER TUMOR WITH GYRUS (TURBT-GYRUS) (N/A) CYSTOSCOPY WITH RETROGRADE PYELOGRAM (Bilateral)  Patient Location: PACU  Anesthesia Type:General  Level of Consciousness: Patient easily awoken, sedated, comfortable, cooperative, following commands, responds to stimulation.   Airway & Oxygen Therapy: Patient spontaneously breathing, ventilating well, oxygen via simple oxygen mask.  Post-op Assessment: Report given to PACU RN, vital signs reviewed and stable, moving all extremities.   Post vital signs: Reviewed and stable.  Complications: No apparent anesthesia complications

## 2013-12-31 NOTE — Progress Notes (Signed)
Assisted pt in getting dressed.  Pt and wife instructed how to empty catheter bag. They both verbalized understanding.  Pt is encouraged to use his cane at home when walking.  Pt up and ambulated in room and in hallway short distance and did well.  He felt fine and is ready to go home.

## 2013-12-31 NOTE — Brief Op Note (Signed)
12/31/2013  11:30 AM  PATIENT:  Manuel Le  80 y.o. male  PRE-OPERATIVE DIAGNOSIS:  BLADDER CANCER  POST-OPERATIVE DIAGNOSIS:  BLADDER CANCER  PROCEDURE:  Procedure(s): TRANSURETHRAL RESECTION OF BLADDER TUMOR WITH GYRUS (TURBT-GYRUS) (N/A) CYSTOSCOPY WITH RETROGRADE PYELOGRAM (Bilateral)  SURGEON:  Surgeon(s) and Role:    * Alexis Frock, MD - Primary  PHYSICIAN ASSISTANT:   ASSISTANTS: none   ANESTHESIA:   general  EBL:     BLOOD ADMINISTERED:none  DRAINS: 18f foley to straight drain, irrigation port plugged   LOCAL MEDICATIONS USED:  NONE  SPECIMEN:  Source of Specimen:  1- bladder tumor, 2 - base of bladder tumor  DISPOSITION OF SPECIMEN:  PATHOLOGY  COUNTS:  YES  TOURNIQUET:  * No tourniquets in log *  DICTATION: .Other Dictation: Dictation Number [id number]  PLAN OF CARE: Discharge to home after PACU  PATIENT DISPOSITION:  PACU - hemodynamically stable.   Delay start of Pharmacological VTE agent (>24hrs) due to surgical blood loss or risk of bleeding: yes

## 2013-12-31 NOTE — Discharge Instructions (Signed)
1 - You may have urinary urgency (bladder spasms) and bloody urine on / off x several days. This is normal.  2 - Call MD or go to ER for fever >102, severe pain / nausea / vomiting not relieved by medications, or acute change in medical status  3 - Remove Warfarin on Monday if there is no longer visible blood in the urine.

## 2014-01-01 NOTE — Op Note (Signed)
NAMEULYSSES, ALPER NO.:  [id number]  MEDICAL RECORD NO.:  [id number]  LOCATION:  [facility]                         FACILITY:  [facility]  PHYSICIAN:  Alexis Frock, MD     DATE OF BIRTH:  23-Dec-1926  DATE OF PROCEDURE: 12/31/2013   DATE OF DISCHARGE:  12/31/2013                              OPERATIVE REPORT   DIAGNOSES:  Large bladder tumor, history of gross hematuria with clots.  PROCEDURES: 1. Cystoscopy with bilateral retrograde pyelograms with     interpretation. 2. Transurethral resection of bladder tumor, volume large.  ESTIMATED BLOOD LOSS:  50 mL.  COMPLICATIONS:  None.  SPECIMENS: 1. Bladder tumor. 2. Base of bladder tumor.  FINDINGS: 1. Large nodular tumor at the bladder dome with several areas of     papillary satellite tumor around it, total surface area 8     centimeter squared. 2. Unremarkable bilateral retrograde pyelograms. 3. No evidence of bladder perforation following resection and     seromuscular biopsies from the base.  INDICATIONS:  Mr. Schrieber is a pleasant 80 year old gentleman who was found on workup of gross hematuria with clots to have a large bladder dome tumor.  CT imaging revealed clinically-localized disease.  Options were discussed including transurethral resection for initial diagnostic and staging and treatment purposes and wished to proceed.  Informed consent was obtained and placed in the medical record.  Notably, he has been off of his Coumadin for atrial fibrillation and he has cardiac clearance and feeling to be moderate risk for surgery.  PROCEDURE IN DETAIL:  The patient being, Thao Vanover, was verified. Procedure being transurethral resection of bladder tumor was confirmed. Procedure was carried out.  Time-out was performed.  Intravenous antibiotics were administered.  General LMA anesthesia was introduced. The patient was placed into a low-lithotomy position.  Sterile field was created by prepping and draping  the patient's penis, perineum, and proximal thighs using iodine x3.  Next, cystourethroscopy was performed using a 22-French rigid cystoscope with 12-degree offset lens. Inspection of the anterior and posterior urethra were unremarkable. Inspection of the urinary bladder revealed mild trabeculation.  Ureteral orifices were in the normal anatomic position.  The left ureteral orifice was cannulated with a 6-French end-hold catheter and left retrograde pyelogram was obtained.  Left retrograde pyelogram demonstrated a single left ureter with single- system left kidney.  No filling defects or narrowing noted.  Similarly, the right ureteral orifice was cannulated and right retrograde pyelogram was obtained.  Right retrograde pyelogram demonstrated a single right ureter with single-system right kidney.  No filling defects or narrowing noted. Next, the cystoscope was exchanged with 28-French ACMI continuous flow resectoscope sheath with medium loop, settings of cut 120 coagulating 180 and very very careful systematic resection was performed of the dome tumor and satellite lesions down to what appeared to be the superficial most muscle of the bladder.  Additional coagulation current was applied at the lateral aspects of the resection site for additional 2 cm. Bladder tumor fragments were irrigated and set aside for permanent pathology, labeled bladder tumor.  Next, cold cup biopsy forceps were used to obtain deeper seromuscular bites from the area of the prior base  of the dominant nodular tumor.  These were set aside for permanent pathology, labeled base of bladder tumor.  Additional coagulation current was applied to these biopsy sites.  Direct inspection of the biopsy sites revealed no evidence of bladder perforation grossly and there was excellent hemostasis.  Given the large volume of tumor at the dome and the patient's advanced age, it was felt that perioperative urethral catheter would be  warranted to prevent retention and as such, the resectoscope was exchanged for a new 24-French 3-way Foley catheter, 10 mL sterile water in the balloon.  Irrigation port was plugged.  This was irrigated quantitatively and efflux was light pink without clots. Procedure was terminated.  The patient tolerated the procedure well. There were no immediate periprocedural complications.  The patient was taken to the postanesthesia care unit in stable condition.          ______________________________ Alexis Frock, MD     TM/MEDQ  D:  12/31/2013  T:  01/01/2014  Job:  [id number]

## 2014-01-02 ENCOUNTER — Ambulatory Visit: Payer: Medicare Other | Admitting: Cardiovascular Disease

## 2014-01-02 ENCOUNTER — Encounter (HOSPITAL_COMMUNITY): Payer: Self-pay | Admitting: Urology

## 2014-01-05 DIAGNOSIS — N200 Calculus of kidney: Secondary | ICD-10-CM | POA: Diagnosis not present

## 2014-01-05 DIAGNOSIS — R3989 Other symptoms and signs involving the genitourinary system: Secondary | ICD-10-CM | POA: Diagnosis not present

## 2014-01-05 DIAGNOSIS — R310 Gross hematuria: Secondary | ICD-10-CM | POA: Diagnosis not present

## 2014-01-05 DIAGNOSIS — C679 Malignant neoplasm of bladder, unspecified: Secondary | ICD-10-CM | POA: Diagnosis not present

## 2014-01-30 ENCOUNTER — Encounter: Payer: Self-pay | Admitting: Cardiovascular Disease

## 2014-01-30 ENCOUNTER — Ambulatory Visit (INDEPENDENT_AMBULATORY_CARE_PROVIDER_SITE_OTHER): Payer: Medicare Other | Admitting: Cardiovascular Disease

## 2014-01-30 VITALS — BP 127/69 | HR 76 | Ht 72.0 in | Wt 193.0 lb

## 2014-01-30 DIAGNOSIS — I2510 Atherosclerotic heart disease of native coronary artery without angina pectoris: Secondary | ICD-10-CM | POA: Diagnosis not present

## 2014-01-30 DIAGNOSIS — M79609 Pain in unspecified limb: Secondary | ICD-10-CM

## 2014-01-30 DIAGNOSIS — R931 Abnormal findings on diagnostic imaging of heart and coronary circulation: Secondary | ICD-10-CM

## 2014-01-30 DIAGNOSIS — M79605 Pain in left leg: Secondary | ICD-10-CM

## 2014-01-30 DIAGNOSIS — R9439 Abnormal result of other cardiovascular function study: Secondary | ICD-10-CM

## 2014-01-30 DIAGNOSIS — I4891 Unspecified atrial fibrillation: Secondary | ICD-10-CM | POA: Diagnosis not present

## 2014-01-30 DIAGNOSIS — R5383 Other fatigue: Secondary | ICD-10-CM

## 2014-01-30 DIAGNOSIS — R5381 Other malaise: Secondary | ICD-10-CM | POA: Diagnosis not present

## 2014-01-30 DIAGNOSIS — I10 Essential (primary) hypertension: Secondary | ICD-10-CM

## 2014-01-30 DIAGNOSIS — R319 Hematuria, unspecified: Secondary | ICD-10-CM

## 2014-01-30 DIAGNOSIS — M79604 Pain in right leg: Secondary | ICD-10-CM

## 2014-01-30 DIAGNOSIS — C679 Malignant neoplasm of bladder, unspecified: Secondary | ICD-10-CM

## 2014-01-30 DIAGNOSIS — D509 Iron deficiency anemia, unspecified: Secondary | ICD-10-CM

## 2014-01-30 MED ORDER — ASPIRIN EC 325 MG PO TBEC: 325.0000 mg | DELAYED_RELEASE_TABLET | Freq: Every day | ORAL | Status: DC

## 2014-01-30 NOTE — Patient Instructions (Signed)
   Begin Aspirin 325mg  daily Continue all other medications.   Follow up early August

## 2014-01-30 NOTE — Progress Notes (Signed)
Patient ID: Manuel Le, male   DOB: 01/29/1927, 80 y.o.   MRN: [id number]      SUBJECTIVE: The patient is here to followup for atrial fibrillation and hypertension as well as fatigue, ongoing for 6 months. Lexiscan Cardiolite on 12/18/13 demonstrated mild to moderate degree of peri-infarct ischemia seen in the mid inferior wall, mid inferoseptal wall, mid inferolateral wall, and basal inferior wall. Echocardiography in January 2015 demonstrated normal left ventricular systolic function, EF 12-75%.  He had previous problems with hematuria from a bladder tumor and UTI, and aspirin as well as warfarin were discontinued.   Since stopping warfarin, he has had no more hematuria. He denies chest pain and shortness of breath. However, he continues to feel fatigued with minimal exertion.  He complains of significant bilateral leg pain since stopping aspirin and warfarin. He describes it as a "drawing sensation". He denies sharp shooting pains and leg cramps per se.    No Known Allergies  Current Outpatient Prescriptions  Medication Sig Dispense Refill  . acetaminophen (TYLENOL) 500 MG tablet Take 1,000 mg by mouth every 6 (six) hours as needed for mild pain. Pain      . amLODipine (NORVASC) 5 MG tablet Take 5 mg by mouth every morning.       . cetirizine (ZYRTEC) 10 MG tablet Take 10 mg by mouth every morning.       . Cholecalciferol (VITAMIN D-3) 1000 UNITS CAPS Take 1 capsule by mouth daily.      Marland Kitchen losartan-hydrochlorothiazide (HYZAAR) 100-12.5 MG per tablet Take 1 tablet by mouth every morning.       . Misc Natural Products (OSTEO BI-FLEX ADV JOINT SHIELD) TABS Take 2 tablets by mouth every morning.       . Multiple Vitamins-Minerals (CENTRUM SILVER ADULT 50+) TABS Take 1 tablet by mouth every morning.      . pramipexole (MIRAPEX) 0.5 MG tablet Take 0.5 mg by mouth at bedtime.      . vitamin B-12 (CYANOCOBALAMIN) 1000 MCG tablet Take 1,000 mcg by mouth daily.       No current  facility-administered medications for this visit.    Past Medical History  Diagnosis Date  . Hypertension   . Coronary artery disease   . Iron deficiency anemia   . Bladder cancer   . Hematuria   . Atrial fibrillation and flutter   . Mild obstructive sleep apnea     no cpap    Past Surgical History  Procedure Laterality Date  . Colonoscopy  07/04/2012    Procedure: COLONOSCOPY;  Surgeon: Rogene Houston, MD;  Location: [facility];  Service: Endoscopy;  Laterality: N/A;  320  . Esophagogastroduodenoscopy  07/04/2012    Procedure: ESOPHAGOGASTRODUODENOSCOPY (EGD);  Surgeon: Rogene Houston, MD;  Location: [facility];  Service: Endoscopy;  Laterality: N/A;  . Cardiovascular stress test  12-15-2013  DR Kate Sable ([place])    MILD - MODERATE PERI-INFARCT ISCHEMIA  OF INFERIOR WALL, MID-INFEROSEPTAL WALL, MID-INFEROLATERAL WALL, & BASAL INFERIOR WALL/   NORMAL LVF /  EF 59%/  INTERMEDIATE RISK STUDY  . Transthoracic echocardiogram  09-11-2013    MODERATE LVH/  EF 55-60%/  MILD MR & TR/  MILD TO MODERATE CALCIFIED AV WITHOUT STENOSIS/  MILD LAE/   MODERATE PR  . Total knee arthroplasty Bilateral 2000  &  2003?  Marland Kitchen Hernia repair Right   . Transurethral resection of bladder tumor with gyrus (turbt-gyrus) N/A 12/31/2013    Procedure: TRANSURETHRAL  RESECTION OF BLADDER TUMOR WITH GYRUS (TURBT-GYRUS);  Surgeon: Alexis Frock, MD;  Location: [facility];  Service: Urology;  Laterality: N/A;  . Cystoscopy w/ retrogrades Bilateral 12/31/2013    Procedure: CYSTOSCOPY WITH RETROGRADE PYELOGRAM;  Surgeon: Alexis Frock, MD;  Location: [facility];  Service: Urology;  Laterality: Bilateral;    History   Social History  . Marital Status: Married    Spouse Name: N/A    Number of Children: N/A  . Years of Education: N/A   Occupational History  . Not on file.   Social History Main Topics  . Smoking status: Never Smoker   . Smokeless tobacco: Never Used  . Alcohol Use: No  .  Drug Use: No  . Sexual Activity: Not on file   Other Topics Concern  . Not on file   Social History Narrative  . No narrative on file     Filed Vitals:   01/30/14 1125  BP: 127/69  Pulse: 76  Height: 6' (1.829 m)  Weight: 193 lb (87.544 kg)    PHYSICAL EXAM General: NAD  Neck: No JVD, no thyromegaly or thyroid nodule.  Lungs: Clear to auscultation bilaterally with normal respiratory effort.  CV: Nondisplaced PMI. Heart regular, normal rate, normal S1/S2, II/VI ejection systolic murmur at RUSB and II/VI apical holosystolic murmur. No peripheral edema. No carotid bruit. Normal pedal pulses.  Abdomen: Soft, nontender, no hepatosplenomegaly, no distention.  Skin: Intact without lesions or rashes.  Neurologic: Alert and oriented x 3.  Psych: Normal affect.  Extremities: No clubbing or cyanosis.  HEENT: Normal.    ECG: reviewed and available in electronic records.      ASSESSMENT AND PLAN: 1. Atrial fibrillation/flutter: Warfarin has been held and ASA was previously d/c, as he has been diagnosed with bladder cancer. Given the fact that his heart rate is controlled off of AV nodal blocking agents, he most certainly has conduction system disease. His CHADS-Vasc score is 3, thus warranting anticoagulation as he is at high risk for a CVA. In addition to this, malignancy is associated with a hypercoagulable state. However, given the aforementioned diagnosis of bladder cancer with associated hematuria, I will hold off on reinstituting warfarin and start ASA 325 mg daily.  2. Hypertension: Controlled today.  3. Fatigue: Lexiscan Cardiolite stress test demonstrative of mild to moderate peri-infarct ischemia. His fatigue may be secondary to ischemic heart disease, or due to bladder cancer with anemia, or a combination of both. He would prefer to hold off on coronary angiography at this time. I will start ASA 325 mg daily.  4. Bladder cancer: He has declined bladder removal and/or  chemotherapy/radiation treatments, and is pursuing a conservative strategy.  5. Bilateral leg pain: Will resume ASA to see if this alleviates his sypmtoms.  Dispo: f/u in early August.   Kate Sable, M.D., F.A.C.C.

## 2014-02-11 DIAGNOSIS — M25569 Pain in unspecified knee: Secondary | ICD-10-CM | POA: Diagnosis not present

## 2014-02-11 DIAGNOSIS — M1710 Unilateral primary osteoarthritis, unspecified knee: Secondary | ICD-10-CM | POA: Diagnosis not present

## 2014-02-26 ENCOUNTER — Other Ambulatory Visit (HOSPITAL_COMMUNITY): Payer: Self-pay | Admitting: Orthopedic Surgery

## 2014-02-26 DIAGNOSIS — R52 Pain, unspecified: Secondary | ICD-10-CM

## 2014-03-04 DIAGNOSIS — R5381 Other malaise: Secondary | ICD-10-CM | POA: Diagnosis not present

## 2014-03-04 DIAGNOSIS — R5383 Other fatigue: Secondary | ICD-10-CM | POA: Diagnosis not present

## 2014-03-12 ENCOUNTER — Encounter (HOSPITAL_COMMUNITY)
Admission: RE | Admit: 2014-03-12 | Discharge: 2014-03-12 | Disposition: A | Discharge status: Home / Self Care | Payer: Medicare Other | Source: Ambulatory Visit | Attending: Orthopedic Surgery | Admitting: Orthopedic Surgery

## 2014-03-12 ENCOUNTER — Other Ambulatory Visit (HOSPITAL_COMMUNITY): Payer: Self-pay | Admitting: Orthopedic Surgery

## 2014-03-12 DIAGNOSIS — R52 Pain, unspecified: Secondary | ICD-10-CM

## 2014-03-12 DIAGNOSIS — C679 Malignant neoplasm of bladder, unspecified: Secondary | ICD-10-CM | POA: Diagnosis not present

## 2014-03-12 DIAGNOSIS — Z96659 Presence of unspecified artificial knee joint: Secondary | ICD-10-CM | POA: Diagnosis not present

## 2014-03-12 DIAGNOSIS — M79609 Pain in unspecified limb: Secondary | ICD-10-CM | POA: Diagnosis not present

## 2014-03-12 MED ORDER — TECHNETIUM TC 99M MEDRONATE IV KIT
23.9000 | PACK | Freq: Once | INTRAVENOUS | Status: AC | PRN
  Administered 2014-03-12: 23.9 via INTRAVENOUS

## 2014-03-20 ENCOUNTER — Encounter: Payer: Medicare Other | Admitting: Cardiovascular Disease

## 2014-03-20 ENCOUNTER — Encounter: Payer: Self-pay | Admitting: Cardiovascular Disease

## 2014-03-20 NOTE — Progress Notes (Signed)
Patient ID: YEHIA MCBAIN, male   DOB: 1927/03/29, 80 y.o.   MRN: [id number]      SUBJECTIVE: The patient is here to followup for atrial fibrillation and hypertension as well as fatigue, ongoing for 6 months. Lexiscan Cardiolite on 12/18/13 demonstrated mild to moderate degree of peri-infarct ischemia seen in the mid inferior wall, mid inferoseptal wall, mid inferolateral wall, and basal inferior wall. Echocardiography in January 2015 demonstrated normal left ventricular systolic function, EF 08-65%.  He had previous problems with hematuria from a bladder tumor and UTI, and aspirin as well as warfarin were discontinued.     Review of Systems: As per "subjective", otherwise negative.  No Known Allergies  Current Outpatient Prescriptions  Medication Sig Dispense Refill  . acetaminophen (TYLENOL) 500 MG tablet Take 1,000 mg by mouth every 6 (six) hours as needed for mild pain. Pain      . amLODipine (NORVASC) 5 MG tablet Take 5 mg by mouth every morning.       Marland Kitchen aspirin EC 325 MG tablet Take 1 tablet (325 mg total) by mouth daily.      . cetirizine (ZYRTEC) 10 MG tablet Take 10 mg by mouth every morning.       . Cholecalciferol (VITAMIN D-3) 1000 UNITS CAPS Take 1 capsule by mouth daily.      . ferrous sulfate 325 (65 FE) MG EC tablet Take 325 mg by mouth daily with breakfast.      . losartan-hydrochlorothiazide (HYZAAR) 100-12.5 MG per tablet Take 1 tablet by mouth every morning.       . Misc Natural Products (OSTEO BI-FLEX ADV JOINT SHIELD) TABS Take 2 tablets by mouth every morning.       . Multiple Vitamins-Minerals (CENTRUM SILVER ADULT 50+) TABS Take 1 tablet by mouth every morning.      . NON FORMULARY Codeine #3 prn      . pramipexole (MIRAPEX) 0.5 MG tablet Take 0.5 mg by mouth at bedtime.      . vitamin B-12 (CYANOCOBALAMIN) 1000 MCG tablet Take 1,000 mcg by mouth daily.       No current facility-administered medications for this visit.    Past Medical History  Diagnosis Date    . Hypertension   . Coronary artery disease   . Iron deficiency anemia   . Bladder cancer   . Hematuria   . Atrial fibrillation and flutter   . Mild obstructive sleep apnea     no cpap    Past Surgical History  Procedure Laterality Date  . Colonoscopy  07/04/2012    Procedure: COLONOSCOPY;  Surgeon: Rogene Houston, MD;  [facility];  Service: Endoscopy;  Laterality: N/A;  320  . Esophagogastroduodenoscopy  07/04/2012    Procedure: ESOPHAGOGASTRODUODENOSCOPY (EGD);  Surgeon: Rogene Houston, MD;  [facility];  Service: Endoscopy;  Laterality: N/A;  . Cardiovascular stress test  12-15-2013  DR Kate Sable ([place])    MILD - MODERATE PERI-INFARCT ISCHEMIA  OF INFERIOR WALL, MID-INFEROSEPTAL WALL, MID-INFEROLATERAL WALL, & BASAL INFERIOR WALL/   NORMAL LVF /  EF 59%/  INTERMEDIATE RISK STUDY  . Transthoracic echocardiogram  09-11-2013    MODERATE LVH/  EF 55-60%/  MILD MR & TR/  MILD TO MODERATE CALCIFIED AV WITHOUT STENOSIS/  MILD LAE/   MODERATE PR  . Total knee arthroplasty Bilateral 2000  &  2003?  Marland Kitchen Hernia repair Right   . Transurethral resection of bladder tumor with gyrus (turbt-gyrus) N/A  12/31/2013    Procedure: TRANSURETHRAL RESECTION OF BLADDER TUMOR WITH GYRUS (TURBT-GYRUS);  Surgeon: Alexis Frock, MD;  Location: [facility];  Service: Urology;  Laterality: N/A;  . Cystoscopy w/ retrogrades Bilateral 12/31/2013    Procedure: CYSTOSCOPY WITH RETROGRADE PYELOGRAM;  Surgeon: Alexis Frock, MD;  Location: [facility];  Service: Urology;  Laterality: Bilateral;    History   Social History  . Marital Status: Married    Spouse Name: N/A    Number of Children: N/A  . Years of Education: N/A   Occupational History  . Not on file.   Social History Main Topics  . Smoking status: Never Smoker   . Smokeless tobacco: Never Used  . Alcohol Use: No  . Drug Use: No  . Sexual Activity: Not on file   Other Topics Concern  . Not on file   Social  History Narrative  . No narrative on file     Filed Vitals:   03/20/14 1554  BP: 129/70  Pulse: 76  Height: 6' (1.829 m)  Weight: 185 lb (83.915 kg)    PHYSICAL EXAM General: NAD  Neck: No JVD, no thyromegaly or thyroid nodule.  Lungs: Clear to auscultation bilaterally with normal respiratory effort.  CV: Nondisplaced PMI. Heart regular, normal rate, normal S1/S2, II/VI ejection systolic murmur at RUSB and II/VI apical holosystolic murmur. No peripheral edema. No carotid bruit. Normal pedal pulses.  Abdomen: Soft, nontender, no hepatosplenomegaly, no distention.  Skin: Intact without lesions or rashes.  Neurologic: Alert and oriented x 3.  Psych: Normal affect.  Extremities: No clubbing or cyanosis.  HEENT: Normal.    ECG: reviewed and available in electronic records.      ASSESSMENT AND PLAN:    Kate Sable, M.D., F.A.C.C.

## 2014-04-01 ENCOUNTER — Ambulatory Visit: Payer: Self-pay | Admitting: *Deleted

## 2014-04-01 DIAGNOSIS — I4891 Unspecified atrial fibrillation: Secondary | ICD-10-CM

## 2014-04-01 DIAGNOSIS — Z5181 Encounter for therapeutic drug level monitoring: Secondary | ICD-10-CM

## 2014-04-02 ENCOUNTER — Encounter: Payer: Self-pay | Admitting: Cardiovascular Disease

## 2014-04-02 ENCOUNTER — Ambulatory Visit (INDEPENDENT_AMBULATORY_CARE_PROVIDER_SITE_OTHER): Payer: Medicare Other | Admitting: Cardiovascular Disease

## 2014-04-02 VITALS — BP 124/48 | HR 59 | Ht 72.0 in | Wt 185.0 lb

## 2014-04-02 DIAGNOSIS — I10 Essential (primary) hypertension: Secondary | ICD-10-CM | POA: Diagnosis not present

## 2014-04-02 DIAGNOSIS — I2510 Atherosclerotic heart disease of native coronary artery without angina pectoris: Secondary | ICD-10-CM | POA: Diagnosis not present

## 2014-04-02 DIAGNOSIS — I4891 Unspecified atrial fibrillation: Secondary | ICD-10-CM

## 2014-04-02 DIAGNOSIS — R9439 Abnormal result of other cardiovascular function study: Secondary | ICD-10-CM | POA: Diagnosis not present

## 2014-04-02 DIAGNOSIS — R5381 Other malaise: Secondary | ICD-10-CM | POA: Diagnosis not present

## 2014-04-02 DIAGNOSIS — M79609 Pain in unspecified limb: Secondary | ICD-10-CM

## 2014-04-02 DIAGNOSIS — R931 Abnormal findings on diagnostic imaging of heart and coronary circulation: Secondary | ICD-10-CM

## 2014-04-02 DIAGNOSIS — M79604 Pain in right leg: Secondary | ICD-10-CM

## 2014-04-02 DIAGNOSIS — C671 Malignant neoplasm of dome of bladder: Secondary | ICD-10-CM

## 2014-04-02 DIAGNOSIS — R5383 Other fatigue: Secondary | ICD-10-CM

## 2014-04-02 DIAGNOSIS — M79605 Pain in left leg: Secondary | ICD-10-CM

## 2014-04-02 NOTE — Progress Notes (Signed)
Patient ID: SAHEJ SCHRIEBER, male   DOB: [birth year], 80 y.o.   MRN: [id number]      SUBJECTIVE: The patient is here to followup for atrial fibrillation and hypertension as well as fatigue, ongoing for over 6 months. Lexiscan Cardiolite on 12/18/13 demonstrated mild to moderate degree of peri-infarct ischemia seen in the mid inferior wall, mid inferoseptal wall, mid inferolateral wall, and basal inferior wall. Echocardiography in January 2015 demonstrated normal left ventricular systolic function, EF 11-91%.  He had previous problems with hematuria from a bladder tumor and UTI, and warfarin was discontinued.   He continues to feel fatigued when walking short distances, but denies exertional chest pain and dyspnea. Hgb on 5/13 8.7. Bone scan was negative for metastatic bone disease. He continues to experience bilateral leg pain, although his knee pain is not as bad in recent days.  Review of Systems: As per "subjective", otherwise negative.  No Known Allergies  Current Outpatient Prescriptions  Medication Sig Dispense Refill  . acetaminophen (TYLENOL) 500 MG tablet Take 1,000 mg by mouth every 6 (six) hours as needed for mild pain. Pain      . amLODipine (NORVASC) 5 MG tablet Take 5 mg by mouth every morning.       Marland Kitchen aspirin EC 325 MG tablet Take 1 tablet (325 mg total) by mouth daily.      . cetirizine (ZYRTEC) 10 MG tablet Take 10 mg by mouth every morning.       . Cholecalciferol (VITAMIN D-3) 1000 UNITS CAPS Take 1 capsule by mouth daily.      . ferrous sulfate 325 (65 FE) MG EC tablet Take 325 mg by mouth daily with breakfast.      . losartan-hydrochlorothiazide (HYZAAR) 100-12.5 MG per tablet Take 1 tablet by mouth every morning.       . Misc Natural Products (OSTEO BI-FLEX ADV JOINT SHIELD) TABS Take 2 tablets by mouth every morning.       . Multiple Vitamins-Minerals (CENTRUM SILVER ADULT 50+) TABS Take 1 tablet by mouth every morning.      . NON FORMULARY Codeine #3 prn      . vitamin  B-12 (CYANOCOBALAMIN) 1000 MCG tablet Take 1,000 mcg by mouth daily.       No current facility-administered medications for this visit.    Past Medical History  Diagnosis Date  . Hypertension   . Coronary artery disease   . Iron deficiency anemia   . Bladder cancer   . Hematuria   . Atrial fibrillation and flutter   . Mild obstructive sleep apnea     no cpap    Past Surgical History  Procedure Laterality Date  . Colonoscopy  07/04/2012    Procedure: COLONOSCOPY;  Surgeon: Rogene Houston, MD;  [facility];  Service: Endoscopy;  Laterality: N/A;  320  . Esophagogastroduodenoscopy  07/04/2012    Procedure: ESOPHAGOGASTRODUODENOSCOPY (EGD);  Surgeon: Rogene Houston, MD;  [facility];  Service: Endoscopy;  Laterality: N/A;  . Cardiovascular stress test  12-15-2013  DR Kate Sable ([place])    MILD - MODERATE PERI-INFARCT ISCHEMIA  OF INFERIOR WALL, MID-INFEROSEPTAL WALL, MID-INFEROLATERAL WALL, & BASAL INFERIOR WALL/   NORMAL LVF /  EF 59%/  INTERMEDIATE RISK STUDY  . Transthoracic echocardiogram  09-11-2013    MODERATE LVH/  EF 55-60%/  MILD MR & TR/  MILD TO MODERATE CALCIFIED AV WITHOUT STENOSIS/  MILD LAE/   MODERATE PR  . Total knee arthroplasty  Bilateral 2000  &  2003?  Marland Kitchen Hernia repair Right   . Transurethral resection of bladder tumor with gyrus (turbt-gyrus) N/A 12/31/2013    Procedure: TRANSURETHRAL RESECTION OF BLADDER TUMOR WITH GYRUS (TURBT-GYRUS);  Surgeon: Alexis Frock, MD;  Location: [facility];  Service: Urology;  Laterality: N/A;  . Cystoscopy w/ retrogrades Bilateral 12/31/2013    Procedure: CYSTOSCOPY WITH RETROGRADE PYELOGRAM;  Surgeon: Alexis Frock, MD;  Location: [facility];  Service: Urology;  Laterality: Bilateral;    History   Social History  . Marital Status: Married    Spouse Name: N/A    Number of Children: N/A  . Years of Education: N/A   Occupational History  . Not on file.   Social History Main Topics  .  Smoking status: Never Smoker   . Smokeless tobacco: Never Used  . Alcohol Use: No  . Drug Use: No  . Sexual Activity: Not on file   Other Topics Concern  . Not on file   Social History Narrative  . No narrative on file     Filed Vitals:   04/02/14 1531  BP: 124/48  Pulse: 59  Height: 6' (1.829 m)  Weight: 185 lb (83.915 kg)  SpO2: 98%    PHYSICAL EXAM General: NAD  Neck: No JVD, no thyromegaly or thyroid nodule.  Lungs: Clear to auscultation bilaterally with normal respiratory effort.  CV: Nondisplaced PMI. Heart regular, normal rate, normal S1/S2, II/VI ejection systolic murmur at RUSB and II/VI apical holosystolic murmur. No peripheral edema. No carotid bruit. Normal pedal pulses.  Abdomen: Soft, nontender, no hepatosplenomegaly, no distention.  Skin: Intact without lesions or rashes.  Neurologic: Alert and oriented x 3.  Psych: Normal affect.  Extremities: No clubbing or cyanosis.  HEENT: Normal.   ECG: reviewed and available in electronic records.      ASSESSMENT AND PLAN: 1. Atrial fibrillation/flutter: Warfarin has been held as he has been diagnosed with bladder cancer. Given the fact that his heart rate is controlled off of AV nodal blocking agents, he most certainly has conduction system disease. His CHADS-Vasc score is 3, thus warranting anticoagulation as he is at high risk for a CVA. In addition to this, malignancy is associated with a hypercoagulable state. However, given the aforementioned diagnosis of bladder cancer with associated hematuria, I will hold off on reinstituting warfarin and will continue ASA 325 mg daily.   2. Hypertension: Controlled today.   3. Fatigue: Lexiscan Cardiolite stress test demonstrative of mild to moderate peri-infarct ischemia. His fatigue may be secondary to ischemic heart disease, or due to bladder cancer with anemia, or a combination of both. He is now considering coronary angiography. I will continue ASA 325 mg daily. If he  chooses to proceed and is need of PCI, he may only be a candidate for plain balloon angioplasty as potent antiplatelet therapy would most certainly lead to a recurrence of hematuria. Will obtain a CBC to see if Hgb is very low, and will also obtain a BMET.  4. Bladder cancer: He has declined bladder removal and/or chemotherapy/radiation treatments, and is pursuing a conservative strategy.   5. Bilateral leg pain: Due to f/u with another provider. Bone scan negative for metastatic disease.  Dispo: f/u 3 months.   Kate Sable, M.D., F.A.C.C.

## 2014-04-02 NOTE — Patient Instructions (Addendum)
Continue all current medications. Labs for CBC and BMET. Office will contact with results via phone or letter.   Your physician wants you to follow up in:  3 months.  You will receive a reminder letter in the mail one-two months in advance.  If you don't receive a letter, please call our office to schedule the follow up appointment

## 2014-04-03 ENCOUNTER — Ambulatory Visit: Payer: Medicare Other | Admitting: Cardiovascular Disease

## 2014-04-03 DIAGNOSIS — M1710 Unilateral primary osteoarthritis, unspecified knee: Secondary | ICD-10-CM | POA: Diagnosis not present

## 2014-04-06 ENCOUNTER — Ambulatory Visit: Payer: Medicare Other | Admitting: Cardiovascular Disease

## 2014-04-06 ENCOUNTER — Telehealth: Payer: Self-pay | Admitting: *Deleted

## 2014-04-06 NOTE — Telephone Encounter (Signed)
Results given.

## 2014-04-06 NOTE — Telephone Encounter (Signed)
Message copied by Orion Modest on Mon Apr 06, 2014 12:08 PM ------      Message from: Kate Sable A      Created: Fri Apr 03, 2014  5:27 PM       Hgb ok. Some amount of dehydration. ------

## 2014-04-06 NOTE — Telephone Encounter (Signed)
Message copied by Orion Modest on Mon Apr 06, 2014 10:03 AM ------      Message from: Kate Sable A      Created: Fri Apr 03, 2014  5:27 PM       Hgb ok. Some amount of dehydration. ------

## 2014-05-11 DIAGNOSIS — R310 Gross hematuria: Secondary | ICD-10-CM | POA: Diagnosis not present

## 2014-05-11 DIAGNOSIS — C670 Malignant neoplasm of trigone of bladder: Secondary | ICD-10-CM | POA: Diagnosis not present

## 2014-05-11 DIAGNOSIS — R3989 Other symptoms and signs involving the genitourinary system: Secondary | ICD-10-CM | POA: Diagnosis not present

## 2014-05-13 DIAGNOSIS — L570 Actinic keratosis: Secondary | ICD-10-CM | POA: Diagnosis not present

## 2014-05-13 DIAGNOSIS — L851 Acquired keratosis [keratoderma] palmaris et plantaris: Secondary | ICD-10-CM | POA: Diagnosis not present

## 2014-05-20 DIAGNOSIS — Z23 Encounter for immunization: Secondary | ICD-10-CM | POA: Diagnosis not present

## 2014-06-09 ENCOUNTER — Emergency Department (HOSPITAL_COMMUNITY)
Admission: EM | Admit: 2014-06-09 | Discharge: 2014-06-09 | Disposition: A | Discharge status: Home / Self Care | Payer: Medicare Other | Attending: Emergency Medicine | Admitting: Emergency Medicine

## 2014-06-09 ENCOUNTER — Emergency Department (HOSPITAL_COMMUNITY): Payer: Medicare Other

## 2014-06-09 ENCOUNTER — Encounter (HOSPITAL_COMMUNITY): Payer: Self-pay | Admitting: Emergency Medicine

## 2014-06-09 DIAGNOSIS — Z23 Encounter for immunization: Secondary | ICD-10-CM | POA: Insufficient documentation

## 2014-06-09 DIAGNOSIS — S0081XA Abrasion of other part of head, initial encounter: Secondary | ICD-10-CM

## 2014-06-09 DIAGNOSIS — Z8551 Personal history of malignant neoplasm of bladder: Secondary | ICD-10-CM | POA: Insufficient documentation

## 2014-06-09 DIAGNOSIS — Z7982 Long term (current) use of aspirin: Secondary | ICD-10-CM | POA: Insufficient documentation

## 2014-06-09 DIAGNOSIS — Y929 Unspecified place or not applicable: Secondary | ICD-10-CM | POA: Diagnosis not present

## 2014-06-09 DIAGNOSIS — S01111A Laceration without foreign body of right eyelid and periocular area, initial encounter: Secondary | ICD-10-CM | POA: Insufficient documentation

## 2014-06-09 DIAGNOSIS — S0083XA Contusion of other part of head, initial encounter: Secondary | ICD-10-CM

## 2014-06-09 DIAGNOSIS — Z79899 Other long term (current) drug therapy: Secondary | ICD-10-CM | POA: Diagnosis not present

## 2014-06-09 DIAGNOSIS — Z862 Personal history of diseases of the blood and blood-forming organs and certain disorders involving the immune mechanism: Secondary | ICD-10-CM | POA: Diagnosis not present

## 2014-06-09 DIAGNOSIS — Y9389 Activity, other specified: Secondary | ICD-10-CM | POA: Insufficient documentation

## 2014-06-09 DIAGNOSIS — W1830XA Fall on same level, unspecified, initial encounter: Secondary | ICD-10-CM | POA: Insufficient documentation

## 2014-06-09 DIAGNOSIS — I10 Essential (primary) hypertension: Secondary | ICD-10-CM | POA: Diagnosis not present

## 2014-06-09 DIAGNOSIS — S0990XA Unspecified injury of head, initial encounter: Secondary | ICD-10-CM | POA: Insufficient documentation

## 2014-06-09 DIAGNOSIS — S0181XA Laceration without foreign body of other part of head, initial encounter: Secondary | ICD-10-CM | POA: Diagnosis not present

## 2014-06-09 DIAGNOSIS — I2510 Atherosclerotic heart disease of native coronary artery without angina pectoris: Secondary | ICD-10-CM | POA: Diagnosis not present

## 2014-06-09 MED ORDER — LIDOCAINE HCL (PF) 1 % IJ SOLN
5.0000 mL | Freq: Once | INTRAMUSCULAR | Status: AC
  Administered 2014-06-09: 5 mL

## 2014-06-09 MED ORDER — TETANUS-DIPHTH-ACELL PERTUSSIS 5-2.5-18.5 LF-MCG/0.5 IM SUSP
0.5000 mL | Freq: Once | INTRAMUSCULAR | Status: AC
  Administered 2014-06-09: 0.5 mL via INTRAMUSCULAR
  Filled 2014-06-09: qty 0.5

## 2014-06-09 MED ORDER — LIDOCAINE HCL (PF) 1 % IJ SOLN
INTRAMUSCULAR | Status: AC
  Administered 2014-06-09: 5 mL
  Filled 2014-06-09: qty 5

## 2014-06-09 MED ORDER — LIDOCAINE-EPINEPHRINE-TETRACAINE (LET) SOLUTION
3.0000 mL | Freq: Once | NASAL | Status: AC
  Administered 2014-06-09: 3 mL via TOPICAL
  Filled 2014-06-09: qty 3

## 2014-06-09 NOTE — ED Provider Notes (Signed)
CSN: [id number]     Arrival date & time 06/09/14  0710 History  This chart was scribed for Manuel Cable, MD by Ludger Nutting, ED Scribe. This patient was seen in room [facility]18/[facility]18 and the patient's care was started 7:17 AM.    Chief Complaint  Patient presents with  . Fall   Patient is a 80 y.o. male presenting with fall. The history is provided by the patient. No language interpreter was used.  Fall This is a new problem. The current episode started less than 1 hour ago. Associated symptoms include headaches. Pertinent negatives include no chest pain and no abdominal pain. Nothing aggravates the symptoms. Nothing relieves the symptoms. He has tried nothing for the symptoms. The treatment provided no relief.    HPI Comments: Manuel Le is a 80 y.o. male with past medical history of HTN, bladder CA, anemia who presents to the Emergency Department complaining of a fall that occurred about 30 min ago. Patient states he was taking the trash out when tripped and fell, striking his head. He complains of a laceration to the right eyebrow with an associated HA. He denies LOC. He denies any other injuries. He denies nausea, vomiting, neck pain, back pain, chest pain, dizziness, eye pain. He takes a daily ASA 325 mg. His last tetanus is unknown.   Past Medical History  Diagnosis Date  . Hypertension   . Coronary artery disease   . Iron deficiency anemia   . Bladder cancer   . Hematuria   . Atrial fibrillation and flutter   . Mild obstructive sleep apnea     no cpap   Past Surgical History  Procedure Laterality Date  . Colonoscopy  07/04/2012    Procedure: COLONOSCOPY;  Surgeon: Rogene Houston, MD;  Location: [facility];  Service: Endoscopy;  Laterality: N/A;  320  . Esophagogastroduodenoscopy  07/04/2012    Procedure: ESOPHAGOGASTRODUODENOSCOPY (EGD);  Surgeon: Rogene Houston, MD;  Location: [facility];  Service: Endoscopy;  Laterality: N/A;  . Cardiovascular stress test   12-15-2013  DR Kate Sable ([place])    MILD - MODERATE PERI-INFARCT ISCHEMIA  OF INFERIOR WALL, MID-INFEROSEPTAL WALL, MID-INFEROLATERAL WALL, & BASAL INFERIOR WALL/   NORMAL LVF /  EF 59%/  INTERMEDIATE RISK STUDY  . Transthoracic echocardiogram  09-11-2013    MODERATE LVH/  EF 55-60%/  MILD MR & TR/  MILD TO MODERATE CALCIFIED AV WITHOUT STENOSIS/  MILD LAE/   MODERATE PR  . Total knee arthroplasty Bilateral 2000  &  2003?  Marland Kitchen Hernia repair Right   . Transurethral resection of bladder tumor with gyrus (turbt-gyrus) N/A 12/31/2013    Procedure: TRANSURETHRAL RESECTION OF BLADDER TUMOR WITH GYRUS (TURBT-GYRUS);  Surgeon: Alexis Frock, MD;  Location: [facility];  Service: Urology;  Laterality: N/A;  . Cystoscopy w/ retrogrades Bilateral 12/31/2013    Procedure: CYSTOSCOPY WITH RETROGRADE PYELOGRAM;  Surgeon: Alexis Frock, MD;  Location: [facility];  Service: Urology;  Laterality: Bilateral;   No family history on file. History  Substance Use Topics  . Smoking status: Never Smoker   . Smokeless tobacco: Never Used  . Alcohol Use: No    Review of Systems  Cardiovascular: Negative for chest pain.  Gastrointestinal: Negative for abdominal pain.  Skin: Positive for wound.  Neurological: Positive for headaches. Negative for syncope.  All other systems reviewed and are negative.     Allergies  Review of patient's allergies indicates no known allergies.  Home Medications  Prior to Admission medications   Medication Sig Start Date End Date Taking? Authorizing Provider  acetaminophen (TYLENOL) 500 MG tablet Take 1,000 mg by mouth every 6 (six) hours as needed for mild pain. Pain    Historical Provider, MD  amLODipine (NORVASC) 5 MG tablet Take 5 mg by mouth every morning.  08/16/13   Historical Provider, MD  aspirin EC 325 MG tablet Take 1 tablet (325 mg total) by mouth daily. 01/30/14   Herminio Commons, MD  cetirizine (ZYRTEC) 10 MG tablet Take 10 mg by mouth every  morning.     Historical Provider, MD  Cholecalciferol (VITAMIN D-3) 1000 UNITS CAPS Take 1 capsule by mouth daily.    Historical Provider, MD  ferrous sulfate 325 (65 FE) MG EC tablet Take 325 mg by mouth daily with breakfast.    Historical Provider, MD  losartan-hydrochlorothiazide (HYZAAR) 100-12.5 MG per tablet Take 1 tablet by mouth every morning.  09/19/13   Historical Provider, MD  Misc Natural Products (OSTEO BI-FLEX ADV JOINT SHIELD) TABS Take 2 tablets by mouth every morning.     Historical Provider, MD  Multiple Vitamins-Minerals (CENTRUM SILVER ADULT 50+) TABS Take 1 tablet by mouth every morning.    Historical Provider, MD  NON FORMULARY Codeine #3 prn    Historical Provider, MD  vitamin B-12 (CYANOCOBALAMIN) 1000 MCG tablet Take 1,000 mcg by mouth daily.    Historical Provider, MD   BP 150/80  Pulse 60  Temp(Src) 97.5 F (36.4 C) (Oral)  Resp 18  Ht 5\' 9"  (1.753 m)  Wt 185 lb (83.915 kg)  BMI 27.31 kg/m2  SpO2 100% Physical Exam  Nursing note and vitals reviewed.  CONSTITUTIONAL: Well developed/well nourished HEAD: Normocephalic, laceration to right eyebrow  EYES: EOMI/PERRL, no eye injury noted.  No foreign body noted in OD ENMT: Mucous membranes moist, tenderness and abrasion to right maxilla, no septal hematoma, no other facial injury  NECK: supple no meningeal signs SPINE:entire spine nontender CV:  no murmurs/rubs/gallops noted LUNGS: Lungs are clear to auscultation bilaterally, no apparent distress ABDOMEN: soft, nontender, no rebound or guarding GU:no cva tenderness NEURO: Pt is awake/alert, moves all extremitiesx4 EXTREMITIES: pulses normal, full ROM SKIN: warm, color normal PSYCH: no abnormalities of mood noted   ED Course  Procedures   DIAGNOSTIC STUDIES: Oxygen Saturation is 99% on RA, normal by my interpretation.    COORDINATION OF CARE: 7:22 AM Will order head CT and will repair laceration. Discussed treatment plan with pt at bedside and pt agreed  to plan.  8:02 AM LACERATION REPAIR Performed by: Manuel Cable, MD  Consent: Verbal consent obtained. Risks and benefits: risks, benefits and alternatives were discussed Patient identity confirmed: provided demographic data Time out performed prior to procedure Prepped and Draped in normal sterile fashion Wound explored Laceration Location: right eyebrow Laceration Length: 2cm No Foreign Bodies seen or palpated Anesthesia: local infiltration Local anesthetic: lidocaine 1% without epinephrine Anesthetic total: 4 ml Amount of cleaning: standard Skin closure: simple Number of sutures or staples: 4 vicryl Technique: simple interrupted Patient tolerance: Patient tolerated the procedure well with no immediate complications.     Imaging Review Ct Head Wo Contrast  06/09/2014   CLINICAL DATA:  [place] while taking his has trash out this morning. Hit head and face.  EXAM: CT HEAD AND ORBITS WITHOUT CONTRAST  TECHNIQUE: Contiguous axial images were obtained from the base of the skull through the vertex without contrast. Multidetector CT imaging of the orbits was performed using  the standard protocol without intravenous contrast.  COMPARISON:  None.  FINDINGS: CT HEAD FINDINGS  Age related cerebral atrophy, ventriculomegaly and periventricular white matter disease. No extra-axial fluid collections are identified. No CT findings for acute hemispheric infarction an or intracranial hemorrhage. Benign-appearing bilateral basal ganglia calcifications are noted. The brainstem and cerebellum are grossly normal. Vascular calcifications are noted.  No acute skull fracture is identified. There is a laceration and small hematoma near the right orbit. Scattered mucoperiosteal thickening in the paranasal sinuses. The mastoid air cells and middle ear cavities are clear.  CT ORBITS FINDINGS  No orbital fractures are identified. The globes are intact. The facial bones are intact.  Moderate sinus disease noted with  mucous retention cysts or polyps in both maxillary sinuses. The mandibular condyles are normally located. Moderate degenerative changes. No nasal bone fractures.  IMPRESSION: Age related cerebral atrophy, ventriculomegaly and periventricular white matter disease. No acute intracranial findings or skull fracture.  No facial bone fractures are identified. Moderate scattered sinus disease is noted.   Electronically Signed   By: Kalman Jewels M.D.   On: 06/09/2014 07:56   Ct Orbitss W/o Cm  06/09/2014   CLINICAL DATA:  [place] while taking his has trash out this morning. Hit head and face.  EXAM: CT HEAD AND ORBITS WITHOUT CONTRAST  TECHNIQUE: Contiguous axial images were obtained from the base of the skull through the vertex without contrast. Multidetector CT imaging of the orbits was performed using the standard protocol without intravenous contrast.  COMPARISON:  None.  FINDINGS: CT HEAD FINDINGS  Age related cerebral atrophy, ventriculomegaly and periventricular white matter disease. No extra-axial fluid collections are identified. No CT findings for acute hemispheric infarction an or intracranial hemorrhage. Benign-appearing bilateral basal ganglia calcifications are noted. The brainstem and cerebellum are grossly normal. Vascular calcifications are noted.  No acute skull fracture is identified. There is a laceration and small hematoma near the right orbit. Scattered mucoperiosteal thickening in the paranasal sinuses. The mastoid air cells and middle ear cavities are clear.  CT ORBITS FINDINGS  No orbital fractures are identified. The globes are intact. The facial bones are intact.  Moderate sinus disease noted with mucous retention cysts or polyps in both maxillary sinuses. The mandibular condyles are normally located. Moderate degenerative changes. No nasal bone fractures.  IMPRESSION: Age related cerebral atrophy, ventriculomegaly and periventricular white matter disease. No acute intracranial findings or  skull fracture.  No facial bone fractures are identified. Moderate scattered sinus disease is noted.   Electronically Signed   By: Kalman Jewels M.D.   On: 06/09/2014 07:56     MDM   Final diagnoses:  Laceration of right eyebrow, initial encounter  Minor head injury without loss of consciousness, initial encounter  Contusion of face, initial encounter  Abrasion of face, initial encounter    Nursing notes including past medical history and social history reviewed and considered in documentation Previous records reviewed and considered   I personally performed the services described in this documentation, which was scribed in my presence. The recorded information has been reviewed and is accurate.      Manuel Cable, MD 06/09/14 [phone]

## 2014-06-09 NOTE — Discharge Instructions (Signed)

## 2014-06-09 NOTE — ED Notes (Signed)
Pt reports was taking the trash out this am and "got his feet tangling up." pt reports fell and hit his head. Pt denies loc. Moderate laceration noted to right eyebrow. Pt denies taking any blood thinners. Pt alert and oriented. nad noted.

## 2014-07-03 ENCOUNTER — Encounter: Payer: Self-pay | Admitting: *Deleted

## 2014-07-03 ENCOUNTER — Ambulatory Visit (INDEPENDENT_AMBULATORY_CARE_PROVIDER_SITE_OTHER): Payer: Medicare Other | Admitting: Cardiovascular Disease

## 2014-07-03 ENCOUNTER — Other Ambulatory Visit: Payer: Self-pay | Admitting: Cardiovascular Disease

## 2014-07-03 ENCOUNTER — Telehealth: Payer: Self-pay | Admitting: Cardiovascular Disease

## 2014-07-03 ENCOUNTER — Encounter: Payer: Self-pay | Admitting: Cardiovascular Disease

## 2014-07-03 VITALS — BP 133/68 | HR 65 | Ht 72.0 in | Wt 184.0 lb

## 2014-07-03 DIAGNOSIS — I2510 Atherosclerotic heart disease of native coronary artery without angina pectoris: Secondary | ICD-10-CM

## 2014-07-03 DIAGNOSIS — R931 Abnormal findings on diagnostic imaging of heart and coronary circulation: Secondary | ICD-10-CM

## 2014-07-03 DIAGNOSIS — I209 Angina pectoris, unspecified: Secondary | ICD-10-CM

## 2014-07-03 DIAGNOSIS — I4891 Unspecified atrial fibrillation: Secondary | ICD-10-CM | POA: Diagnosis not present

## 2014-07-03 DIAGNOSIS — D509 Iron deficiency anemia, unspecified: Secondary | ICD-10-CM

## 2014-07-03 DIAGNOSIS — M79604 Pain in right leg: Secondary | ICD-10-CM

## 2014-07-03 DIAGNOSIS — I10 Essential (primary) hypertension: Secondary | ICD-10-CM | POA: Diagnosis not present

## 2014-07-03 DIAGNOSIS — M79605 Pain in left leg: Secondary | ICD-10-CM

## 2014-07-03 DIAGNOSIS — R0609 Other forms of dyspnea: Secondary | ICD-10-CM | POA: Diagnosis not present

## 2014-07-03 DIAGNOSIS — R319 Hematuria, unspecified: Secondary | ICD-10-CM

## 2014-07-03 DIAGNOSIS — R5383 Other fatigue: Secondary | ICD-10-CM

## 2014-07-03 DIAGNOSIS — C671 Malignant neoplasm of dome of bladder: Secondary | ICD-10-CM

## 2014-07-03 DIAGNOSIS — R079 Chest pain, unspecified: Secondary | ICD-10-CM

## 2014-07-03 DIAGNOSIS — R0600 Dyspnea, unspecified: Secondary | ICD-10-CM

## 2014-07-03 DIAGNOSIS — R531 Weakness: Secondary | ICD-10-CM

## 2014-07-03 MED ORDER — ISOSORBIDE MONONITRATE ER 30 MG PO TB24: 30.0000 mg | ORAL_TABLET | Freq: Every day | ORAL | Status: DC

## 2014-07-03 MED ORDER — NITROGLYCERIN 0.4 MG SL SUBL: 0.4000 mg | SUBLINGUAL_TABLET | SUBLINGUAL | Status: DC | PRN

## 2014-07-03 NOTE — Telephone Encounter (Signed)
Left heart cath - Wednesday, 11/18 - 8:30 -  Dr. Burt Knack  Checking percert

## 2014-07-03 NOTE — Patient Instructions (Signed)
   Begin Imdur 30mg  daily  Begin Nitroglycerin as needed for severe chest pain only  New prescriptions sent to pharmacy on above. Continue all other medications.   Your physician has requested that you have a cardiac catheterization. Cardiac catheterization is used to diagnose and/or treat various heart conditions. Doctors may recommend this procedure for a number of different reasons. The most common reason is to evaluate chest pain. Chest pain can be a symptom of coronary artery disease (CAD), and cardiac catheterization can show whether plaque is narrowing or blocking your heart's arteries. This procedure is also used to evaluate the valves, as well as measure the blood flow and oxygen levels in different parts of your heart. For further information please visit [email or web address]. Please follow instruction sheet, as given. Follow up will be given after discharge from procedure above.

## 2014-07-03 NOTE — Progress Notes (Signed)
Patient ID: Manuel Le, male   DOB: Apr 01, 1927, 80 y.o.   MRN: [id number]      SUBJECTIVE: The patient is here to followup for atrial fibrillation and hypertension as well as fatigue, ongoing for over 9 months. Lexiscan Cardiolite on 12/18/13 demonstrated mild to moderate degree of peri-infarct ischemia seen in the mid inferior wall, mid inferoseptal wall, mid inferolateral wall, and basal inferior wall. Echocardiography in January 2015 demonstrated normal left ventricular systolic function, EF 62-95%.  He had previous problems with hematuria from a bladder tumor and UTI, and warfarin was discontinued.  Hgb 12 on 04/02/14.  In the past six weeks, he has had 3-4 episodes of chest pain. Today while walking to our clinic, he experienced a burning retrosternal sensation which lasted for at least one to two minutes. He continues to feel fatigued but this has not gotten any worse since his last visit with me in August. He has exertional dyspnea.  Review of Systems: As per "subjective", otherwise negative.  No Known Allergies  Current Outpatient Prescriptions  Medication Sig Dispense Refill  . acetaminophen (TYLENOL) 500 MG tablet Take 1,000 mg by mouth every 6 (six) hours as needed for mild pain. Pain    . amLODipine (NORVASC) 5 MG tablet Take 5 mg by mouth every morning.     Marland Kitchen aspirin EC 325 MG tablet Take 1 tablet (325 mg total) by mouth daily.    . cetirizine (ZYRTEC) 10 MG tablet Take 10 mg by mouth every morning.     . Cholecalciferol (VITAMIN D-3) 1000 UNITS CAPS Take 1 capsule by mouth daily.    . ferrous sulfate 325 (65 FE) MG EC tablet Take 325 mg by mouth daily with breakfast.    . losartan-hydrochlorothiazide (HYZAAR) 100-12.5 MG per tablet Take 1 tablet by mouth every morning.     . Misc Natural Products (OSTEO BI-FLEX ADV JOINT SHIELD) TABS Take 2 tablets by mouth every morning.     . Multiple Vitamins-Minerals (CENTRUM SILVER ADULT 50+) TABS Take 1 tablet by mouth every morning.      . NON FORMULARY Codeine #3 prn    . vitamin B-12 (CYANOCOBALAMIN) 1000 MCG tablet Take 1,000 mcg by mouth daily.     No current facility-administered medications for this visit.    Past Medical History  Diagnosis Date  . Hypertension   . Coronary artery disease   . Iron deficiency anemia   . Bladder cancer   . Hematuria   . Atrial fibrillation and flutter   . Mild obstructive sleep apnea     no cpap    Past Surgical History  Procedure Laterality Date  . Colonoscopy  07/04/2012    Procedure: COLONOSCOPY;  Surgeon: Rogene Houston, MD;  Location: [facility];  Service: Endoscopy;  Laterality: N/A;  320  . Esophagogastroduodenoscopy  07/04/2012    Procedure: ESOPHAGOGASTRODUODENOSCOPY (EGD);  Surgeon: Rogene Houston, MD;  Location: [facility];  Service: Endoscopy;  Laterality: N/A;  . Cardiovascular stress test  12-15-2013  DR Kate Sable ([place])    MILD - MODERATE PERI-INFARCT ISCHEMIA  OF INFERIOR WALL, MID-INFEROSEPTAL WALL, MID-INFEROLATERAL WALL, & BASAL INFERIOR WALL/   NORMAL LVF /  EF 59%/  INTERMEDIATE RISK STUDY  . Transthoracic echocardiogram  09-11-2013    MODERATE LVH/  EF 55-60%/  MILD MR & TR/  MILD TO MODERATE CALCIFIED AV WITHOUT STENOSIS/  MILD LAE/   MODERATE PR  . Total knee arthroplasty Bilateral 2000  &  2003?  . Hernia repair Right   . Transurethral resection of bladder tumor with gyrus (turbt-gyrus) N/A 12/31/2013    Procedure: TRANSURETHRAL RESECTION OF BLADDER TUMOR WITH GYRUS (TURBT-GYRUS);  Surgeon: Alexis Frock, MD;  Location: [facility];  Service: Urology;  Laterality: N/A;  . Cystoscopy w/ retrogrades Bilateral 12/31/2013    Procedure: CYSTOSCOPY WITH RETROGRADE PYELOGRAM;  Surgeon: Alexis Frock, MD;  Location: [facility];  Service: Urology;  Laterality: Bilateral;    History   Social History  . Marital Status: Married    Spouse Name: N/A    Number of Children: N/A  . Years of Education: N/A   Occupational History  . Not  on file.   Social History Main Topics  . Smoking status: Never Smoker   . Smokeless tobacco: Never Used  . Alcohol Use: No  . Drug Use: No  . Sexual Activity: Not on file   Other Topics Concern  . Not on file   Social History Narrative  . No narrative on file   BP 133/68  Pulse 65  SpO2 99% Weight 184 lb (83.462 kg) Height 6' (1.829 m)     PHYSICAL EXAM General: NAD  Neck: No JVD, no thyromegaly or thyroid nodule.  Lungs: Clear to auscultation bilaterally with normal respiratory effort.  CV: Nondisplaced PMI. Irregular rhythm, normal S1/S2, II/VI ejection systolic murmur at RUSB and II/VI apical holosystolic murmur. No peripheral edema. No carotid bruit. Normal pedal pulses.  Abdomen: Soft, nontender, no hepatosplenomegaly, no distention.  Skin: Intact without lesions or rashes.  Neurologic: Alert and oriented x 3.  Psych: Normal affect.  Extremities: No clubbing or cyanosis.  HEENT: Normal.   ECG: Most recent ECG reviewed.      ASSESSMENT AND PLAN: 1. Atrial fibrillation/flutter: Warfarin has been held as he has been diagnosed with bladder cancer. Given the fact that his heart rate is controlled off of AV nodal blocking agents, he most certainly has conduction system disease. His CHADS-Vasc score is 3, thus warranting anticoagulation as he is at high risk for a CVA. In addition to this, malignancy is associated with a hypercoagulable state. However, given the aforementioned diagnosis of bladder cancer with associated hematuria, I will hold off on reinstituting warfarin and will continue ASA 325 mg daily.   2. Essential hypertension: Controlled today. No changes.  3. Fatigue, chest pain, and exertional dyspnea: Lexiscan Cardiolite stress test demonstrative of mild to moderate peri-infarct ischemia. His fatigue may be secondary to ischemic heart disease, or due to bladder cancer with anemia, or a combination of both. Hgb 12 on 04/02/14. He wants to pursue coronary  angiography. I will continue ASA 325 mg daily. If he is in need of PCI, he may only be a candidate for plain balloon angioplasty as potent antiplatelet therapy would most certainly lead to a recurrence of hematuria.  I will arrange for coronary angiography. I will prescribe Imdur 30 mg and SL nitro prn in the interim.  4. Bladder cancer: He has declined bladder removal and/or chemotherapy/radiation treatments, and is pursuing a conservative strategy.   5. Bilateral leg pain: Bone scan negative for metastatic disease. Follows up with PCP.  Dispo: f/u after cath.  Kate Sable, M.D., F.A.C.C.

## 2014-07-03 NOTE — Telephone Encounter (Signed)
Pt has Medicare and supplement. No precert required °

## 2014-07-08 ENCOUNTER — Encounter (HOSPITAL_COMMUNITY): Payer: Self-pay | Admitting: Cardiovascular Disease

## 2014-07-08 ENCOUNTER — Encounter (HOSPITAL_COMMUNITY)
Admission: RE | Disposition: A | Discharge status: Home / Self Care | Payer: Medicare Other | Source: Ambulatory Visit | Attending: Cardiovascular Disease

## 2014-07-08 ENCOUNTER — Ambulatory Visit (HOSPITAL_COMMUNITY)
Admission: RE | Admit: 2014-07-08 | Discharge: 2014-07-09 | Disposition: A | Discharge status: Home / Self Care | Payer: Medicare Other | Source: Ambulatory Visit | Attending: Cardiovascular Disease | Admitting: Cardiovascular Disease

## 2014-07-08 DIAGNOSIS — I10 Essential (primary) hypertension: Secondary | ICD-10-CM | POA: Diagnosis not present

## 2014-07-08 DIAGNOSIS — M79604 Pain in right leg: Secondary | ICD-10-CM | POA: Insufficient documentation

## 2014-07-08 DIAGNOSIS — Z96659 Presence of unspecified artificial knee joint: Secondary | ICD-10-CM | POA: Insufficient documentation

## 2014-07-08 DIAGNOSIS — M79605 Pain in left leg: Secondary | ICD-10-CM | POA: Insufficient documentation

## 2014-07-08 DIAGNOSIS — I25118 Atherosclerotic heart disease of native coronary artery with other forms of angina pectoris: Secondary | ICD-10-CM | POA: Diagnosis not present

## 2014-07-08 DIAGNOSIS — I4891 Unspecified atrial fibrillation: Secondary | ICD-10-CM | POA: Diagnosis not present

## 2014-07-08 DIAGNOSIS — R0609 Other forms of dyspnea: Secondary | ICD-10-CM

## 2014-07-08 DIAGNOSIS — G4733 Obstructive sleep apnea (adult) (pediatric): Secondary | ICD-10-CM | POA: Diagnosis not present

## 2014-07-08 DIAGNOSIS — R5383 Other fatigue: Secondary | ICD-10-CM

## 2014-07-08 DIAGNOSIS — Z7982 Long term (current) use of aspirin: Secondary | ICD-10-CM | POA: Insufficient documentation

## 2014-07-08 DIAGNOSIS — I208 Other forms of angina pectoris: Secondary | ICD-10-CM

## 2014-07-08 DIAGNOSIS — C679 Malignant neoplasm of bladder, unspecified: Secondary | ICD-10-CM | POA: Diagnosis not present

## 2014-07-08 DIAGNOSIS — R079 Chest pain, unspecified: Secondary | ICD-10-CM | POA: Diagnosis present

## 2014-07-08 DIAGNOSIS — I25119 Atherosclerotic heart disease of native coronary artery with unspecified angina pectoris: Secondary | ICD-10-CM | POA: Diagnosis not present

## 2014-07-08 DIAGNOSIS — R931 Abnormal findings on diagnostic imaging of heart and coronary circulation: Secondary | ICD-10-CM

## 2014-07-08 DIAGNOSIS — Z87440 Personal history of urinary (tract) infections: Secondary | ICD-10-CM | POA: Insufficient documentation

## 2014-07-08 DIAGNOSIS — I2089 Other forms of angina pectoris: Secondary | ICD-10-CM

## 2014-07-08 DIAGNOSIS — R9439 Abnormal result of other cardiovascular function study: Secondary | ICD-10-CM | POA: Insufficient documentation

## 2014-07-08 DIAGNOSIS — R0600 Dyspnea, unspecified: Secondary | ICD-10-CM

## 2014-07-08 HISTORY — DX: Other forms of angina pectoris: I20.89

## 2014-07-08 HISTORY — DX: Other forms of angina pectoris: I20.8

## 2014-07-08 HISTORY — PX: PERCUTANEOUS CORONARY STENT INTERVENTION (PCI-S): SHX5485

## 2014-07-08 HISTORY — PX: CORONARY STENT PLACEMENT: SHX1402

## 2014-07-08 HISTORY — PX: LEFT HEART CATHETERIZATION WITH CORONARY ANGIOGRAM: SHX5451

## 2014-07-08 HISTORY — DX: Unspecified osteoarthritis, unspecified site: M19.90

## 2014-07-08 LAB — CBC
HEMATOCRIT: 36.3 % — AB (ref 39.0–52.0)
HEMOGLOBIN: 12.2 g/dL — AB (ref 13.0–17.0)
MCH: 29.1 pg (ref 26.0–34.0)
MCHC: 33.6 g/dL (ref 30.0–36.0)
MCV: 86.6 fL (ref 78.0–100.0)
Platelets: 165 10*3/uL (ref 150–400)
RBC: 4.19 MIL/uL — ABNORMAL LOW (ref 4.22–5.81)
RDW: 13.8 % (ref 11.5–15.5)
WBC: 5.5 10*3/uL (ref 4.0–10.5)

## 2014-07-08 LAB — BASIC METABOLIC PANEL
ANION GAP: 12 (ref 5–15)
BUN: 21 mg/dL (ref 6–23)
CALCIUM: 9.6 mg/dL (ref 8.4–10.5)
CHLORIDE: 107 meq/L (ref 96–112)
CO2: 24 mEq/L (ref 19–32)
CREATININE: 0.77 mg/dL (ref 0.50–1.35)
GFR calc Af Amer: >90 mL/min (ref >90)
GFR calc non Af Amer: 80 mL/min — ABNORMAL LOW (ref >90)
GLUCOSE: 102 mg/dL — AB (ref 70–99)
Potassium: 4.2 mEq/L (ref 3.7–5.3)
Sodium: 143 mEq/L (ref 137–147)

## 2014-07-08 LAB — PROTIME-INR
INR: 1.06 (ref 0.00–1.49)
Prothrombin Time: 13.9 seconds (ref 11.6–15.2)

## 2014-07-08 LAB — POCT ACTIVATED CLOTTING TIME: Activated Clotting Time: 411 seconds

## 2014-07-08 SURGERY — LEFT HEART CATHETERIZATION WITH CORONARY ANGIOGRAM
Anesthesia: LOCAL

## 2014-07-08 MED ORDER — SODIUM CHLORIDE 0.9 % IJ SOLN: 3.0000 mL | INTRAMUSCULAR | Status: DC | PRN

## 2014-07-08 MED ORDER — FERROUS SULFATE 325 (65 FE) MG PO TABS
325.0000 mg | ORAL_TABLET | Freq: Every day | ORAL | Status: DC
  Administered 2014-07-09: 325 mg via ORAL
  Filled 2014-07-08 (×3): qty 1

## 2014-07-08 MED ORDER — SODIUM CHLORIDE 0.9 % IJ SOLN
3.0000 mL | INTRAMUSCULAR | Status: DC | PRN
Start: 2014-07-08 — End: 2014-07-09

## 2014-07-08 MED ORDER — LIDOCAINE HCL (PF) 1 % IJ SOLN
INTRAMUSCULAR | Status: AC
  Filled 2014-07-08: qty 30

## 2014-07-08 MED ORDER — ACETAMINOPHEN 325 MG PO TABS: 650.0000 mg | ORAL_TABLET | ORAL | Status: DC | PRN

## 2014-07-08 MED ORDER — AMLODIPINE BESYLATE 5 MG PO TABS
5.0000 mg | ORAL_TABLET | Freq: Every morning | ORAL | Status: DC
  Administered 2014-07-09: 10:00:00 5 mg via ORAL
  Filled 2014-07-08 (×4): qty 1

## 2014-07-08 MED ORDER — SODIUM CHLORIDE 0.9 % IV SOLN
1.0000 mL/kg/h | INTRAVENOUS | Status: AC
  Administered 2014-07-08: 1 mL/kg/h via INTRAVENOUS

## 2014-07-08 MED ORDER — ONDANSETRON HCL 4 MG/2ML IJ SOLN: 4.0000 mg | Freq: Four times a day (QID) | INTRAMUSCULAR | Status: DC | PRN

## 2014-07-08 MED ORDER — SODIUM CHLORIDE 0.9 % IJ SOLN: 3.0000 mL | Freq: Two times a day (BID) | INTRAMUSCULAR | Status: DC

## 2014-07-08 MED ORDER — SODIUM CHLORIDE 0.9 % IV SOLN
250.0000 mL | INTRAVENOUS | Status: DC | PRN
Start: 2014-07-08 — End: 2014-07-09

## 2014-07-08 MED ORDER — FENTANYL CITRATE 0.05 MG/ML IJ SOLN
INTRAMUSCULAR | Status: AC
  Filled 2014-07-08: qty 2

## 2014-07-08 MED ORDER — NITROGLYCERIN 1 MG/10 ML FOR IR/CATH LAB
INTRA_ARTERIAL | Status: AC
  Filled 2014-07-08: qty 10

## 2014-07-08 MED ORDER — BIVALIRUDIN 250 MG IV SOLR
INTRAVENOUS | Status: AC
  Filled 2014-07-08: qty 250

## 2014-07-08 MED ORDER — CLOPIDOGREL BISULFATE 75 MG PO TABS
75.0000 mg | ORAL_TABLET | Freq: Every day | ORAL | Status: DC
  Administered 2014-07-09: 75 mg via ORAL
  Filled 2014-07-08: qty 1

## 2014-07-08 MED ORDER — LOSARTAN POTASSIUM 50 MG PO TABS
100.0000 mg | ORAL_TABLET | Freq: Every day | ORAL | Status: DC
  Administered 2014-07-08 – 2014-07-09 (×2): 100 mg via ORAL
  Filled 2014-07-08 (×3): qty 2

## 2014-07-08 MED ORDER — ISOSORBIDE MONONITRATE ER 30 MG PO TB24
30.0000 mg | ORAL_TABLET | Freq: Every day | ORAL | Status: DC
  Administered 2014-07-09: 10:00:00 30 mg via ORAL
  Filled 2014-07-08 (×3): qty 1

## 2014-07-08 MED ORDER — MIDAZOLAM HCL 2 MG/2ML IJ SOLN
INTRAMUSCULAR | Status: AC
  Filled 2014-07-08: qty 2

## 2014-07-08 MED ORDER — CLOPIDOGREL BISULFATE 300 MG PO TABS
ORAL_TABLET | ORAL | Status: AC
  Filled 2014-07-08: qty 2

## 2014-07-08 MED ORDER — ASPIRIN 81 MG PO CHEW
81.0000 mg | CHEWABLE_TABLET | Freq: Every day | ORAL | Status: DC
  Administered 2014-07-09: 81 mg via ORAL
  Filled 2014-07-08 (×2): qty 1

## 2014-07-08 MED ORDER — SODIUM CHLORIDE 0.9 % IV SOLN
250.0000 mL | INTRAVENOUS | Status: DC | PRN
  Administered 2014-07-08: 1000 mL via INTRAVENOUS

## 2014-07-08 MED ORDER — ASPIRIN 81 MG PO CHEW
CHEWABLE_TABLET | ORAL | Status: AC
  Filled 2014-07-08: qty 1

## 2014-07-08 MED ORDER — OXYCODONE-ACETAMINOPHEN 5-325 MG PO TABS: 1.0000 | ORAL_TABLET | ORAL | Status: DC | PRN

## 2014-07-08 MED ORDER — ATORVASTATIN CALCIUM 20 MG PO TABS
20.0000 mg | ORAL_TABLET | Freq: Every day | ORAL | Status: DC
  Administered 2014-07-08: 20 mg via ORAL
  Filled 2014-07-08 (×3): qty 1

## 2014-07-08 MED ORDER — HEPARIN SODIUM (PORCINE) 1000 UNIT/ML IJ SOLN
INTRAMUSCULAR | Status: AC
  Filled 2014-07-08: qty 1

## 2014-07-08 MED ORDER — HEPARIN (PORCINE) IN NACL 2-0.9 UNIT/ML-% IJ SOLN
INTRAMUSCULAR | Status: AC
  Filled 2014-07-08: qty 1500

## 2014-07-08 MED ORDER — LOSARTAN POTASSIUM-HCTZ 100-12.5 MG PO TABS: 1.0000 | ORAL_TABLET | Freq: Every morning | ORAL | Status: DC

## 2014-07-08 MED ORDER — VERAPAMIL HCL 2.5 MG/ML IV SOLN
INTRAVENOUS | Status: AC
  Filled 2014-07-08: qty 2

## 2014-07-08 MED ORDER — ASPIRIN 81 MG PO CHEW
81.0000 mg | CHEWABLE_TABLET | ORAL | Status: AC
  Administered 2014-07-08: 81 mg via ORAL

## 2014-07-08 MED ORDER — LORATADINE 10 MG PO TABS
10.0000 mg | ORAL_TABLET | Freq: Every day | ORAL | Status: DC
  Administered 2014-07-09: 10:00:00 10 mg via ORAL
  Filled 2014-07-08: qty 1

## 2014-07-08 MED ORDER — AMLODIPINE BESYLATE 5 MG PO TABS
5.0000 mg | ORAL_TABLET | Freq: Once | ORAL | Status: AC
  Administered 2014-07-08: 23:00:00 5 mg via ORAL
  Filled 2014-07-08: qty 1

## 2014-07-08 MED ORDER — HYDROCHLOROTHIAZIDE 12.5 MG PO CAPS
12.5000 mg | ORAL_CAPSULE | Freq: Every day | ORAL | Status: DC
  Administered 2014-07-09: 12.5 mg via ORAL
  Filled 2014-07-08: qty 1

## 2014-07-08 NOTE — Interval H&P Note (Signed)
History and Physical Interval Note:  07/08/2014 8:49 AM  Driscilla Moats  has presented today for surgery, with the diagnosis of CP, fatigue, positive stress test  The various methods of treatment have been discussed with the patient and family. After consideration of risks, benefits and other options for treatment, the patient has consented to  Procedure(s): LEFT HEART CATHETERIZATION WITH CORONARY ANGIOGRAM (N/A) as a surgical intervention .  The patient's history has been reviewed, patient examined, no change in status, stable for surgery.  I have reviewed the patient's chart and labs.  Questions were answered to the patient's satisfaction.    Cath Lab Visit (complete for each Cath Lab visit)  Clinical Evaluation Leading to the Procedure:   ACS: No.  Non-ACS:    Anginal Classification: CCS III  Anti-ischemic medical therapy: Minimal Therapy (1 class of medications)  Non-Invasive Test Results: Intermediate-risk stress test findings: cardiac mortality 1-3%/year  Prior CABG: No previous CABG       Sherren Mocha

## 2014-07-08 NOTE — H&P (View-Only) (Signed)
Patient ID: Manuel Le, male   DOB: 1926-09-18, 80 y.o.   MRN: [id number]      SUBJECTIVE: The patient is here to followup for atrial fibrillation and hypertension as well as fatigue, ongoing for over 9 months. Lexiscan Cardiolite on 12/18/13 demonstrated mild to moderate degree of peri-infarct ischemia seen in the mid inferior wall, mid inferoseptal wall, mid inferolateral wall, and basal inferior wall. Echocardiography in January 2015 demonstrated normal left ventricular systolic function, EF 28-36%.  He had previous problems with hematuria from a bladder tumor and UTI, and warfarin was discontinued.  Hgb 12 on 04/02/14.  In the past six weeks, he has had 3-4 episodes of chest pain. Today while walking to our clinic, he experienced a burning retrosternal sensation which lasted for at least one to two minutes. He continues to feel fatigued but this has not gotten any worse since his last visit with me in August. He has exertional dyspnea.  Review of Systems: As per "subjective", otherwise negative.  No Known Allergies  Current Outpatient Prescriptions  Medication Sig Dispense Refill  . acetaminophen (TYLENOL) 500 MG tablet Take 1,000 mg by mouth every 6 (six) hours as needed for mild pain. Pain    . amLODipine (NORVASC) 5 MG tablet Take 5 mg by mouth every morning.     Marland Kitchen aspirin EC 325 MG tablet Take 1 tablet (325 mg total) by mouth daily.    . cetirizine (ZYRTEC) 10 MG tablet Take 10 mg by mouth every morning.     . Cholecalciferol (VITAMIN D-3) 1000 UNITS CAPS Take 1 capsule by mouth daily.    . ferrous sulfate 325 (65 FE) MG EC tablet Take 325 mg by mouth daily with breakfast.    . losartan-hydrochlorothiazide (HYZAAR) 100-12.5 MG per tablet Take 1 tablet by mouth every morning.     . Misc Natural Products (OSTEO BI-FLEX ADV JOINT SHIELD) TABS Take 2 tablets by mouth every morning.     . Multiple Vitamins-Minerals (CENTRUM SILVER ADULT 50+) TABS Take 1 tablet by mouth every morning.      . NON FORMULARY Codeine #3 prn    . vitamin B-12 (CYANOCOBALAMIN) 1000 MCG tablet Take 1,000 mcg by mouth daily.     No current facility-administered medications for this visit.    Past Medical History  Diagnosis Date  . Hypertension   . Coronary artery disease   . Iron deficiency anemia   . Bladder cancer   . Hematuria   . Atrial fibrillation and flutter   . Mild obstructive sleep apnea     no cpap    Past Surgical History  Procedure Laterality Date  . Colonoscopy  07/04/2012    Procedure: COLONOSCOPY;  Surgeon: Rogene Houston, MD;  Location: [facility];  Service: Endoscopy;  Laterality: N/A;  320  . Esophagogastroduodenoscopy  07/04/2012    Procedure: ESOPHAGOGASTRODUODENOSCOPY (EGD);  Surgeon: Rogene Houston, MD;  Location: [facility];  Service: Endoscopy;  Laterality: N/A;  . Cardiovascular stress test  12-15-2013  DR Kate Sable ([place])    MILD - MODERATE PERI-INFARCT ISCHEMIA  OF INFERIOR WALL, MID-INFEROSEPTAL WALL, MID-INFEROLATERAL WALL, & BASAL INFERIOR WALL/   NORMAL LVF /  EF 59%/  INTERMEDIATE RISK STUDY  . Transthoracic echocardiogram  09-11-2013    MODERATE LVH/  EF 55-60%/  MILD MR & TR/  MILD TO MODERATE CALCIFIED AV WITHOUT STENOSIS/  MILD LAE/   MODERATE PR  . Total knee arthroplasty Bilateral 2000  &  2003?  . Hernia repair Right   . Transurethral resection of bladder tumor with gyrus (turbt-gyrus) N/A 12/31/2013    Procedure: TRANSURETHRAL RESECTION OF BLADDER TUMOR WITH GYRUS (TURBT-GYRUS);  Surgeon: Alexis Frock, MD;  Location: [facility];  Service: Urology;  Laterality: N/A;  . Cystoscopy w/ retrogrades Bilateral 12/31/2013    Procedure: CYSTOSCOPY WITH RETROGRADE PYELOGRAM;  Surgeon: Alexis Frock, MD;  Location: [facility];  Service: Urology;  Laterality: Bilateral;    History   Social History  . Marital Status: Married    Spouse Name: N/A    Number of Children: N/A  . Years of Education: N/A   Occupational History  . Not  on file.   Social History Main Topics  . Smoking status: Never Smoker   . Smokeless tobacco: Never Used  . Alcohol Use: No  . Drug Use: No  . Sexual Activity: Not on file   Other Topics Concern  . Not on file   Social History Narrative  . No narrative on file   BP 133/68  Pulse 65  SpO2 99% Weight 184 lb (83.462 kg) Height 6' (1.829 m)     PHYSICAL EXAM General: NAD  Neck: No JVD, no thyromegaly or thyroid nodule.  Lungs: Clear to auscultation bilaterally with normal respiratory effort.  CV: Nondisplaced PMI. Irregular rhythm, normal S1/S2, II/VI ejection systolic murmur at RUSB and II/VI apical holosystolic murmur. No peripheral edema. No carotid bruit. Normal pedal pulses.  Abdomen: Soft, nontender, no hepatosplenomegaly, no distention.  Skin: Intact without lesions or rashes.  Neurologic: Alert and oriented x 3.  Psych: Normal affect.  Extremities: No clubbing or cyanosis.  HEENT: Normal.   ECG: Most recent ECG reviewed.      ASSESSMENT AND PLAN: 1. Atrial fibrillation/flutter: Warfarin has been held as he has been diagnosed with bladder cancer. Given the fact that his heart rate is controlled off of AV nodal blocking agents, he most certainly has conduction system disease. His CHADS-Vasc score is 3, thus warranting anticoagulation as he is at high risk for a CVA. In addition to this, malignancy is associated with a hypercoagulable state. However, given the aforementioned diagnosis of bladder cancer with associated hematuria, I will hold off on reinstituting warfarin and will continue ASA 325 mg daily.   2. Essential hypertension: Controlled today. No changes.  3. Fatigue, chest pain, and exertional dyspnea: Lexiscan Cardiolite stress test demonstrative of mild to moderate peri-infarct ischemia. His fatigue may be secondary to ischemic heart disease, or due to bladder cancer with anemia, or a combination of both. Hgb 12 on 04/02/14. He wants to pursue coronary  angiography. I will continue ASA 325 mg daily. If he is in need of PCI, he may only be a candidate for plain balloon angioplasty as potent antiplatelet therapy would most certainly lead to a recurrence of hematuria.  I will arrange for coronary angiography. I will prescribe Imdur 30 mg and SL nitro prn in the interim.  4. Bladder cancer: He has declined bladder removal and/or chemotherapy/radiation treatments, and is pursuing a conservative strategy.   5. Bilateral leg pain: Bone scan negative for metastatic disease. Follows up with PCP.  Dispo: f/u after cath.  Kate Sable, M.D., F.A.C.C.

## 2014-07-08 NOTE — CV Procedure (Addendum)
Cardiac Catheterization Procedure Note  Name: Manuel Le MRN: [id number] DOB: 1926/11/06  Procedure: Left Heart Cath, Selective Coronary Angiography, PTCA and stenting of the RCA  Indication: this is an 80 year old gentleman who has experienced progressive exertional angina and fatigue over recent weeks. He underwent a stress Myoview scan demonstrating inferior scar and ischemia. Cardiac catheterization was recommended. The patient has her excisional atrial fibrillation. He has been diagnosed with bladder cancer and has had recurrent hematuria on warfarin. This was discontinued 2 months ago and he has had no other bleeding. Plans today are for diagnostic angiography and consideration of PCI with either plain balloon angioplasty or bare-metal stenting in order to limit antiplatelet therapy duration.  Procedural Details:  The right wrist was prepped, draped, and anesthetized with 1% lidocaine. Using the modified Seldinger technique, a 5/6 French Slender sheath was introduced into the right radial artery. 3 mg of verapamil was administered through the sheath, weight-based unfractionated heparin was administered intravenously. Standard Judkins catheters were used for selective coronary angiography and left ventriculography. Catheter exchanges were performed over an exchange length guidewire.  PROCEDURAL FINDINGS Hemodynamics: AO 136/57 LV 138/9   Coronary angiography: Coronary dominance: right  Left mainstem: the left mainstem is calcified. The vessel is widely patent. There is minor distal tapering with approximately 20% stenosis.  Left anterior descending (LAD): the LAD is widely patent to the LV apex. The first diagonal branch has 30-40% ostial stenosis. The mid LAD just after the first septal perforator has 30-40% stenosis. The vessel is diffusely calcified. There are no high-grade stenoses throughout the distribution of the LAD.  Left circumflex (LCx): the left circumflex is large in  caliber. The ostium has 30% stenosis. The obtuse marginal branches are patent without significant stenosis. The posterolateral branch is patent.  Right coronary artery (RCA): the RCA is dominant. The vessel is diffusely calcified. There is very tight 95% stenosis of the true ostium of the vessel. The mid vessel has scattered 50% stenoses. The PDA and PLA branches are patent. The acute marginal branch is patent.  Left ventriculography: LV pressures were recorded but ventriculography was not done because of significant ectopy with the catheter in the left ventricle.  PCI Note:  Following the diagnostic procedure, the decision was made to proceed with PCI of the RCA.  I reviewed treatment options with the patient and his primary cardiologist, Dr Bronson Ing. We agreed PCI of the RCA with a BMS platform was indicated. He has progressive and Class 3 angina on medical therapy with Imdur and Amlodipine. The patient was loaded with Plavix 600 mg. Weight-based bivalirudin was given for anticoagulation. Once a therapeutic ACT was achieved, a 6 [place] JR 4 guide catheter was inserted.  A cougar coronary guidewire was used to cross the lesion.  The lesion was predilated with a 2.5 mm balloon.  Despite multiple attempts with a stent, I was unable to advance the stent. Guide position was lost multiple times. I decided to change out to a grand slam wire. I redilated the lesion with a 2.5 mm noncompliant balloon, and advanced the guide beyond the lesion in order to facilitate stenting. A 3.5 mm x 12 mm MultiLink vision stent was carefully positioned so that the ostium was covered. The stent was deployed at 14 atm. The stent balloon was redilated to 16 atm. The stent was postdilated with a 3.75 mm noncompliant balloon to 18 atm.  Following PCI, there was 10% residual stenosis and TIMI-3 flow. Final angiography confirmed an excellent  result. The patient tolerated the procedure well. There were no immediate procedural  complications. A TR band was used for radial hemostasis. The patient was transferred to the post catheterization recovery area for further monitoring.  PCI Data: Vessel - RCA/Segment - proximal/ostial Percent Stenosis (pre)  95 TIMI-flow 3 Stent 3.5x12 mm Vision BMS Percent Stenosis (post) 10 TIMI-flow (post) 3  Contrast: 145  Radiation dose/Fluoro time: 22.8 minutes  Estimated Blood Loss: minimal  Final Conclusions:   Severe single vessel CAD treated successfully with PCI using a bare-metal stent as detailed above   Recommendations:  DAPT with ASA and plavix x 30 days, then reasonable to stop plavix.  Sherren Mocha MD, [facility] 07/08/2014, 10:07 AM

## 2014-07-08 NOTE — Plan of Care (Signed)
Problem: Consults Goal: Cardiac Cath Patient Education (See Patient Education module for education specifics.) Outcome: Completed/Met Date Met:  07/08/14 Goal: Skin Care Protocol Initiated - if Braden Score 18 or less If consults are not indicated, leave blank or document N/A Outcome: Completed/Met Date Met:  07/08/14  Problem: Phase I Progression Outcomes Goal: Pain controlled with appropriate interventions Outcome: Completed/Met Date Met:  07/08/14 Goal: Voiding-avoid urinary catheter unless indicated Outcome: Completed/Met Date Met:  07/08/14 Goal: Hemodynamically stable Outcome: Completed/Met Date Met:  07/08/14 Goal: Distal pulses equal to baseline Outcome: Completed/Met Date Met:  07/08/14 Goal: Vascular site scale level 0 - I Vascular Site Scale Level 0: No bruising/bleeding/hematoma Level I (Mild): Bruising/Ecchymosis, minimal bleeding/ooozing, palpable hematoma < 3 cm Level II (Moderate): Bleeding not affecting hemodynamic parameters, pseudoaneurysm, palpable hematoma > 3 cm Level III (Severe) Bleeding which affects hemodynamic parameters or retroperitoneal hemorrhage  Outcome: Completed/Met Date Met:  07/08/14 Goal: Post Cath/PCI return to appropriate Path Outcome: Completed/Met Date Met:  07/08/14

## 2014-07-08 NOTE — Progress Notes (Signed)
TR BAND REMOVAL  LOCATION:  right radial  DEFLATED PER PROTOCOL:  Yes.    TIME BAND OFF / DRESSING APPLIED:   1600   SITE UPON ARRIVAL:   Level 1  SITE AFTER BAND REMOVAL:  Level 1  REVERSE ALLEN'S TEST:    positive  CIRCULATION SENSATION AND MOVEMENT:  Within Normal Limits  Yes.    COMMENTS:

## 2014-07-09 DIAGNOSIS — I208 Other forms of angina pectoris: Secondary | ICD-10-CM

## 2014-07-09 DIAGNOSIS — I4891 Unspecified atrial fibrillation: Secondary | ICD-10-CM | POA: Diagnosis not present

## 2014-07-09 DIAGNOSIS — R9439 Abnormal result of other cardiovascular function study: Secondary | ICD-10-CM | POA: Diagnosis not present

## 2014-07-09 DIAGNOSIS — I25119 Atherosclerotic heart disease of native coronary artery with unspecified angina pectoris: Secondary | ICD-10-CM | POA: Diagnosis not present

## 2014-07-09 DIAGNOSIS — I10 Essential (primary) hypertension: Secondary | ICD-10-CM | POA: Diagnosis not present

## 2014-07-09 LAB — CBC
HCT: 34.9 % — ABNORMAL LOW (ref 39.0–52.0)
HEMOGLOBIN: 11.7 g/dL — AB (ref 13.0–17.0)
MCH: 29.3 pg (ref 26.0–34.0)
MCHC: 33.5 g/dL (ref 30.0–36.0)
MCV: 87.5 fL (ref 78.0–100.0)
Platelets: 164 10*3/uL (ref 150–400)
RBC: 3.99 MIL/uL — ABNORMAL LOW (ref 4.22–5.81)
RDW: 13.9 % (ref 11.5–15.5)
WBC: 7.1 10*3/uL (ref 4.0–10.5)

## 2014-07-09 LAB — BASIC METABOLIC PANEL
Anion gap: 15 (ref 5–15)
BUN: 16 mg/dL (ref 6–23)
CHLORIDE: 107 meq/L (ref 96–112)
CO2: 22 mEq/L (ref 19–32)
CREATININE: 0.68 mg/dL (ref 0.50–1.35)
Calcium: 8.9 mg/dL (ref 8.4–10.5)
GFR, EST NON AFRICAN AMERICAN: 85 mL/min — AB (ref >90)
Glucose, Bld: 88 mg/dL (ref 70–99)
POTASSIUM: 4.1 meq/L (ref 3.7–5.3)
Sodium: 144 mEq/L (ref 137–147)

## 2014-07-09 MED ORDER — CARVEDILOL 3.125 MG PO TABS
3.1250 mg | ORAL_TABLET | Freq: Two times a day (BID) | ORAL | Status: DC
  Administered 2014-07-09: 10:00:00 3.125 mg via ORAL
  Filled 2014-07-09: qty 1

## 2014-07-09 MED ORDER — CARVEDILOL 3.125 MG PO TABS: 3.1250 mg | ORAL_TABLET | Freq: Two times a day (BID) | ORAL | Status: DC

## 2014-07-09 MED ORDER — CLOPIDOGREL BISULFATE 75 MG PO TABS: 75.0000 mg | ORAL_TABLET | Freq: Every day | ORAL | Status: DC

## 2014-07-09 MED ORDER — ASPIRIN 81 MG PO TBEC: 81.0000 mg | DELAYED_RELEASE_TABLET | Freq: Every day | ORAL | Status: DC

## 2014-07-09 MED ORDER — ATORVASTATIN CALCIUM 20 MG PO TABS: 20.0000 mg | ORAL_TABLET | Freq: Every day | ORAL | Status: DC

## 2014-07-09 MED FILL — Sodium Chloride IV Soln 0.9%: INTRAVENOUS | Qty: 50 | Status: AC

## 2014-07-09 NOTE — Discharge Summary (Signed)
CARDIOLOGY DISCHARGE SUMMARY   Patient ID: Manuel Le MRN: [id number] DOB/AGE: Oct 19, 1926 80 y.o.  Admit date: 07/08/2014 Discharge date: 07/09/2014  PCP: Delphina Cahill, MD Primary Cardiologist: Dr. Bronson Ing  Primary Discharge Diagnosis:  Exertional angina Secondary Discharge Diagnosis:  Essential hypertension Sinus bradycardia PAF  Procedures: Left Heart Cath, Selective Coronary Angiography, PTCA and stenting of the RCA  Hospital Course: Manuel Le is a 80 y.o. male with a presumed history of CAD. He had an abnormal Cardiolite in April 2015, treated medically. He had recurrent chest pain and was scheduled for cardiac catheterization. He came to the hospital for the procedure on 11/18.  Cardiac catheterization results are below. He had significant single vessel disease in the RCA and a drug-eluting stent was inserted, reducing the 95% stenosis to 0. He tolerated the procedure well.  On 11/19, he was seen by cardiac rehabilitation. He was educated on heart-healthy lifestyle modifications, stent/cath restrictions and exercise guidelines. The patient had significant problems with ambulation and also noted a recent fall with injuries requiring sutures.  A PT consult was called and he ambulated with them. The importance of using a rolling walker at all times at home for mobility and safety was emphasized. He does not wish any outpatient physical therapy.  No further inpatient workup was indicated and he is considered stable for discharge, to follow up as an outpatient.  Labs:   Lab Results  Component Value Date   WBC 7.1 07/09/2014   HGB 11.7* 07/09/2014   HCT 34.9* 07/09/2014   MCV 87.5 07/09/2014   PLT 164 07/09/2014     Recent Labs Lab 07/09/14 0411  NA 144  K 4.1  CL 107  CO2 22  BUN 16  CREATININE 0.68  CALCIUM 8.9  GLUCOSE 88    Recent Labs  07/08/14 0620  INR 1.06   Cardiac Cath: 07/08/2014 Left mainstem: the left mainstem is calcified. The  vessel is widely patent. There is minor distal tapering with approximately 20% stenosis. Left anterior descending (LAD): the LAD is widely patent to the LV apex. The first diagonal branch has 30-40% ostial stenosis. The mid LAD just after the first septal perforator has 30-40% stenosis. The vessel is diffusely calcified. There are no high-grade stenoses throughout the distribution of the LAD. Left circumflex (LCx): the left circumflex is large in caliber. The ostium has 30% stenosis. The obtuse marginal branches are patent without significant stenosis. The posterolateral branch is patent. Right coronary artery (RCA): the RCA is dominant. The vessel is diffusely calcified. There is very tight 95% stenosis of the true ostium of the vessel. The mid vessel has scattered 50% stenoses. The PDA and PLA branches are patent. The acute marginal branch is patent. Left ventriculography: LV pressures were recorded but ventriculography was not done because of significant ectopy with the catheter in the left ventricle. PCI Data: Vessel - RCA/Segment - proximal/ostial Percent Stenosis (pre) 95 TIMI-flow 3 Stent 3.5x12 mm Vision BMS Percent Stenosis (post) 10 TIMI-flow (post) 3 Final Conclusions:  Severe single vessel CAD treated successfully with PCI using a bare-metal stent as detailed above Recommendations:  DAPT with ASA and plavix x 30 days, then reasonable to stop plavix.  EKG: Sinus rhythm with sinus arrhythmia, incomplete right bundle branch block  FOLLOW UP PLANS AND APPOINTMENTS No Known Allergies   Medication List    STOP taking these medications        isosorbide mononitrate 30 MG 24 hr tablet  Commonly known as:  IMDUR      TAKE these medications        acetaminophen 500 MG tablet  Commonly known as:  TYLENOL  Take 1,000 mg by mouth every 6 (six) hours as needed for mild pain. Pain  Notes to Patient:  For pain     amLODipine 5 MG tablet  Commonly known as:  NORVASC  Take 5 mg by  mouth every morning.  Notes to Patient:  For blood pressure     aspirin 81 MG EC tablet  Take 1 tablet (81 mg total) by mouth daily.  Notes to Patient:  Blood thinner to prevent heart attack     atorvastatin 20 MG tablet  Commonly known as:  LIPITOR  Take 1 tablet (20 mg total) by mouth daily at 6 PM.     carvedilol 3.125 MG tablet  Commonly known as:  COREG  Take 1 tablet (3.125 mg total) by mouth 2 (two) times daily with a meal.  Notes to Patient:  Decrease work of the heart     CENTRUM SILVER ADULT 50+ Tabs  Take 1 tablet by mouth every morning.  Notes to Patient:  Vitamin     cetirizine 10 MG tablet  Commonly known as:  ZYRTEC  Take 10 mg by mouth every morning.  Notes to Patient:  Allergies     clopidogrel 75 MG tablet  Commonly known as:  PLAVIX  Take 1 tablet (75 mg total) by mouth daily.  Notes to Patient:  Blood thinner to prevent heart attack  Plan is to stop this medicine after 1 month, confirm at office visit.     ferrous sulfate 325 (65 FE) MG EC tablet  Take 325 mg by mouth daily with breakfast.  Notes to Patient:  Iron to supplement      losartan-hydrochlorothiazide 100-12.5 MG per tablet  Commonly known as:  HYZAAR  Take 1 tablet by mouth every morning.  Notes to Patient:  For blood pressure     nitroGLYCERIN 0.4 MG SL tablet  Commonly known as:  NITROSTAT  Place 1 tablet (0.4 mg total) under the tongue every 5 (five) minutes as needed for chest pain.  Notes to Patient:  Call 911 if pain not relieved after 2nd tablet     OSTEO BI-FLEX ADV JOINT SHIELD Tabs  Take 1 tablet by mouth 2 (two) times daily.     vitamin B-12 1000 MCG tablet  Commonly known as:  CYANOCOBALAMIN  Take 1,000 mcg by mouth daily.     Vitamin D-3 1000 UNITS Caps  Take 1 capsule by mouth daily.        Discharge Instructions    Diet - low sodium heart healthy    Complete by:  As directed      Increase activity slowly    Complete by:  As directed           Follow-up  Information    Follow up with Herminio Commons, MD On 07/24/2014.   Specialty:  Cardiology   Why:  See at 8:20 am   Contact information:   [place] [address] [phone]       BRING ALL MEDICATIONS WITH YOU TO FOLLOW UP APPOINTMENTS  Time spent with patient to include physician time: 46 min Signed: Rosaria Ferries, PA-C 07/09/2014, 1:27 PM Co-Sign MD

## 2014-07-09 NOTE — Care Management Note (Signed)
    Page 1 of 1   07/09/2014     3:52:59 PM CARE MANAGEMENT NOTE 07/09/2014  Patient:  Manuel Le, Manuel Le   Account Number:  [id number]  Date Initiated:  07/09/2014  Documentation initiated by:  Jaylee Lantry  Subjective/Objective Assessment:   Pt s/p single vessel PCI with BMS.  PTA, pt independent, lives with spouse.     Action/Plan:   PT recommending no OP follow up.  Pt aware to use RW at all times for ambulation, and has one at home for use.   Anticipated DC Date:  07/09/2014   Anticipated DC Plan:  [place]  CM consult      Choice offered to / List presented to:             Status of service:  Completed, signed off Medicare Important Message given?  NA - LOS <3 / Initial given by admissions (If response is "NO", the following Medicare IM given date fields will be blank) Date Medicare IM given:   Medicare IM given by:   Date Additional Medicare IM given:   Additional Medicare IM given by:    Discharge Disposition:  HOME/SELF CARE  Per UR Regulation:  Reviewed for med. necessity/level of care/duration of stay  If discussed at [place] of Stay Meetings, dates discussed:    Comments:

## 2014-07-09 NOTE — Evaluation (Signed)
Physical Therapy Evaluation Patient Details Name: Manuel Le MRN: [id number] DOB: 08-24-1926 Today's Date: 07/09/2014   History of Present Illness  Patient is a 80 y/o male s/p Left Heart Cath, Selective Coronary Angiography, PTCA and stenting of the RCA. PMH of HTN, CAD, bladder ca, and A-fib on Coumadin.    Clinical Impression  Patient presents with generalized weakness due to hospitalization and balance deficits impacting safe mobility. Discussed importance of using RW at all times at home for mobility for safety. Pt verbalized understanding of being at high risk for bleeding due to being on blood thinners. Pt adamant about not needing therapy services. Discussed safety at home and limiting activities that could result in falls. Anxious about being discharged. Pt would benefit from acute PT to improve balance, gait and mobility so pt can maximize independence and return to PLOF.    Follow Up Recommendations No PT follow up;Supervision for mobility/OOB    Equipment Recommendations  None recommended by PT    Recommendations for Other Services       Precautions / Restrictions Precautions Precautions: Fall Restrictions Weight Bearing Restrictions: No      Mobility  Bed Mobility               General bed mobility comments: Received sitting in chair upon PT arrival.  Transfers Overall transfer level: Needs assistance Equipment used: None Transfers: Sit to/from Stand Sit to Stand: Supervision         General transfer comment: Supervision for safety.  Ambulation/Gait Ambulation/Gait assistance: Supervision Ambulation Distance (Feet): 150 Feet Assistive device: None Gait Pattern/deviations: Step-through pattern;Decreased stride length;Trunk flexed;Shuffle Gait velocity: slow   General Gait Details: VC's to pick up feet as pt tendency to shuffle at times. Mildly unsteady staying close to handrail during gait but no overt LOB.   Stairs Stairs: Yes Stairs  assistance: Min guard Stair Management: One rail Right;Step to pattern Number of Stairs: 2 General stair comments: Min guard for safety.  Wheelchair Mobility    Modified Rankin (Stroke Patients Only)       Balance Overall balance assessment: Needs assistance Sitting-balance support: Feet supported;No upper extremity supported Sitting balance-Leahy Scale: Good     Standing balance support: During functional activity Standing balance-Leahy Scale: Fair                               Pertinent Vitals/Pain Pain Assessment: No/denies pain    Home Living Family/patient expects to be discharged to:: Private residence Living Arrangements: Spouse/significant other Available Help at Discharge: Family;Available 24 hours/day Type of Home: House Home Access: Stairs to enter Entrance Stairs-Rails: None Entrance Stairs-Number of Steps: 2 Home Layout: One level Home Equipment: Walker - 2 wheels;Cane - single point      Prior Function Level of Independence: Independent         Comments: Very active PTA. 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     Hand Dominance        Extremity/Trunk Assessment   Upper Extremity Assessment: Overall WFL for tasks assessed           Lower Extremity Assessment: Generalized weakness         Communication   Communication: No difficulties  Cognition Arousal/Alertness: Awake/alert Behavior During Therapy: WFL for tasks assessed/performed Overall Cognitive Status: Within Functional Limits for tasks assessed  General Comments General comments (skin integrity, edema, etc.): Lengthy discussion about using RW at home at all times during mobility to decrease risk of falls. Pt adamant about not needing therapy services. Understands pt is at high risk of bleeding due to blood thinners and agreeable to use to prevent falls. Multiple fall history. Discussed being  smart with regards to activities at home as pt very independent.     Exercises        Assessment/Plan    PT Assessment Patient needs continued PT services  PT Diagnosis Generalized weakness   PT Problem List Decreased strength;Decreased balance;Decreased safety awareness  PT Treatment Interventions Balance training;Gait training;DME instruction;Neuromuscular re-education;Stair training;Patient/family education;Functional mobility training   PT Goals (Current goals can be found in the Care Plan section) Acute Rehab PT Goals Patient Stated Goal: to get out of here and return home PT Goal Formulation: With patient Time For Goal Achievement: 07/23/14 Potential to Achieve Goals: Good    Frequency Min 3X/week   Barriers to discharge Decreased caregiver support pt lives with wife who he reports needs his assist.    Co-evaluation               End of Session Equipment Utilized During Treatment: Gait belt Activity Tolerance: Patient tolerated treatment well Patient left: in chair;with call bell/phone within reach Nurse Communication: Mobility status    Functional Assessment Tool Used: Clinical judgment Functional Limitation: Mobility: Walking and moving around Mobility: Walking and Moving Around Current Status [phone]): At least 1 percent but less than 20 percent impaired, limited or restricted Mobility: Walking and Moving Around Goal Status [phone]): At least 1 percent but less than 20 percent impaired, limited or restricted    Time: 3383-2919 PT Time Calculation (min) (ACUTE ONLY): 15 min   Charges:   PT Evaluation $Initial PT Evaluation Tier I: 1 Procedure PT Treatments $Gait Training: 8-22 mins   PT G Codes:   Functional Assessment Tool Used: Clinical judgment Functional Limitation: Mobility: Walking and moving around    [place], [place] 07/09/2014, 11:29 AM  Candy Sledge, PT, DPT [phone]

## 2014-07-09 NOTE — Progress Notes (Signed)
Patient Name: Manuel Le Date of Encounter: 07/09/2014  Active Problems:   Exertional angina   Primary Cardiologist: Dr. Bronson Ing  Patient Profile: 80 year old male with history of hypertension, atrial fibrillation (Coumadin discontinued for hematuria) was seen for fatigue and chest pain, Cardiolite was abnormal, Cath with BMS to the RCA  SUBJECTIVE: Patient slept poorly because of the bed, but no chest pain or shortness of breath overnight. Not aware of heart skipping (PVCs).  OBJECTIVE Filed Vitals:   07/09/14 0019 07/09/14 0200 07/09/14 0300 07/09/14 0406  BP:  156/66 132/38 148/81  Pulse:    70  Temp:    98.2 F (36.8 C)  TempSrc:    Oral  Resp:    18  Height:      Weight: 180 lb 12.4 oz (82 kg)     SpO2:    100%    Intake/Output Summary (Last 24 hours) at 07/09/14 0640 Last data filed at 07/08/14 1900  Gross per 24 hour  Intake 1372.8 ml  Output    700 ml  Net  672.8 ml   Filed Weights   07/08/14 0615 07/09/14 0019  Weight: 180 lb (81.647 kg) 180 lb 12.4 oz (82 kg)   PHYSICAL EXAM General: Well developed, well nourished, male in no acute distress. Head: Normocephalic, atraumatic.  Neck: Supple without bruits, JVD not elevated. Lungs:  Resp regular and unlabored, CTA. Heart:Slightly irregular, S1, S2, no S3, S4, 2/6  murmur; no rub. Abdomen: Soft, non-tender, non-distended, BS + x 4.  Extremities: No clubbing, cyanosis, edema. Right radial cath site without ecchymosis or hematoma  Neuro: Alert and oriented X 3. Moves all extremities spontaneously. Psych: Normal affect.  LABS: CBC: Recent Labs  07/08/14 0620 07/09/14 0411  WBC 5.5 7.1  HGB 12.2* 11.7*  HCT 36.3* 34.9*  MCV 86.6 87.5  PLT 165 164   INR: Recent Labs  07/08/14 0620  INR 3.97   Basic Metabolic Panel: Recent Labs  07/08/14 0620 07/09/14 0411  NA 143 144  K 4.2 4.1  CL 107 107  CO2 24 22  GLUCOSE 102* 88  BUN 21 16  CREATININE 0.77 0.68  CALCIUM 9.6 8.9     TELE:  Sinus rhythm PACs and PVCs, sinus bradycardia 50s      ECG: Sinus rhythm, no acute ischemic changes  Current Medications:  . amLODipine  5 mg Oral q morning - 10a  . aspirin  81 mg Oral Daily  . atorvastatin  20 mg Oral q1800  . clopidogrel  75 mg Oral Q breakfast  . ferrous sulfate  325 mg Oral Q breakfast  . losartan  100 mg Oral Daily   And  . hydrochlorothiazide  12.5 mg Oral Daily  . isosorbide mononitrate  30 mg Oral Daily  . loratadine  10 mg Oral Daily  . sodium chloride  3 mL Intravenous Q12H      ASSESSMENT AND PLAN: Active Problems:   Exertional angina - s/p cath w/ BMS RCA, morning tolerated well. On aspirin, statin, Plavix. No other significant disease, so will discontinue Imdur. Make sure he has sublingual nitroglycerin.    Essential hypertension - SBP 120s-160s on current therapy, continue losartan HCTZ, will add low-dose carvedilol    Sinus bradycardia with frequent PVCs - heart rate occasionally drops into the high 50s, but but he is asymptomatic. Think he will tolerate low-dose carvedilol.    PAF - he was taken off anticoagulation secondary to hematuria in the setting of a  bladder tumor and UTI. Telemetry reviewed, no atrial fibrillation overnight, maintaining sinus rhythm with PACs and PVCs.   Plan: Discharge today  Jonetta Speak , PA-C 6:40 AM 07/09/2014

## 2014-07-09 NOTE — Discharge Instructions (Signed)
PLEASE REMEMBER TO BRING ALL OF YOUR MEDICATIONS TO EACH OF YOUR FOLLOW-UP OFFICE VISITS. ° °PLEASE ATTEND ALL SCHEDULED FOLLOW-UP APPOINTMENTS.  ° °Activity: Increase activity slowly as tolerated. You may shower, but no soaking baths (or swimming) for 1 week. No driving for 2 days. No lifting over 5 lbs for 1 week. No sexual activity for 1 week.  ° °You May Return to Work: in 1 week (if applicable) ° °Wound Care: You may wash cath site gently with soap and water. Keep cath site clean and dry. If you notice pain, swelling, bleeding or pus at your cath site, please call [phone]. ° ° ° °Cardiac Cath Site Care °Refer to this sheet in the next few weeks. These instructions provide you with information on caring for yourself after your procedure. Your caregiver may also give you more specific instructions. Your treatment has been planned according to current medical practices, but problems sometimes occur. Call your caregiver if you have any problems or questions after your procedure. °HOME CARE INSTRUCTIONS °· You may shower 24 hours after the procedure. Remove the bandage (dressing) and gently wash the site with plain soap and water. Gently pat the site dry.  °· Do not apply powder or lotion to the site.  °· Do not sit in a bathtub, swimming pool, or whirlpool for 5 to 7 days.  °· No bending, squatting, or lifting anything over 10 pounds (4.5 kg) as directed by your caregiver.  °· Inspect the site at least twice daily.  °· Do not drive home if you are discharged the same day of the procedure. Have someone else drive you.  °· You may drive 24 hours after the procedure unless otherwise instructed by your caregiver.  °What to expect: °· Any bruising will usually fade within 1 to 2 weeks.  °· Blood that collects in the tissue (hematoma) may be painful to the touch. It should usually decrease in size and tenderness within 1 to 2 weeks.  °SEEK IMMEDIATE MEDICAL CARE IF: °· You have unusual pain at the site or down the  affected limb.  °· You have redness, warmth, swelling, or pain at the site.  °· You have drainage (other than a small amount of blood on the dressing).  °· You have chills.  °· You have a fever or persistent symptoms for more than 72 hours.  °· You have a fever and your symptoms suddenly get worse.  °· Your leg becomes pale, cool, tingly, or numb.  °· You have heavy bleeding from the site. Hold pressure on the site.  °Document Released: 09/09/2010 Document Revised: 07/27/2011 Document Reviewed: 09/09/2010 °ExitCare® Patient Information ©2012 [employer]. ° °

## 2014-07-09 NOTE — Progress Notes (Signed)
CARDIAC REHAB PHASE I   PRE:  Rate/Rhythm: 73 SR    BP: sitting 150/55    SaO2:   MODE:  Ambulation: 200 ft   POST:  Rate/Rhythm: 90 SR with PAC/PVCs    BP: sitting 160/73     SaO2:   Pt already with shoes on, anxious for d/c. C/o continued weak legs. Unsteady walking, assist x1, stayed close to handrailing. Sts he doesn't use his cane or RW at home and still rides lawnmower, cranks and uses chainsaw, sells cars, however he fell in driveway 3 weeks ago and got stitches. Long discussion of "safely/smartly being independent". Instructed pt to use his RW and suggest [facility] PT for recommendations and safety eval. Encouraged pt to always carry cell or life alert (doesn't have this yet). Pt somewhat receptive. He is admittedly stubborn. Discussed Plavix (and risks) and NTG. Did not give diet as pt is following a "cancer diet" and not eating breakfast, nor carbs or sugars. N/a for ex or CRPII at this time.  [id number]   Josephina Shih Blaine CES, ACSM 07/09/2014 8:56 AM

## 2014-07-24 ENCOUNTER — Encounter: Payer: Self-pay | Admitting: Cardiovascular Disease

## 2014-07-24 ENCOUNTER — Ambulatory Visit (INDEPENDENT_AMBULATORY_CARE_PROVIDER_SITE_OTHER): Payer: Medicare Other | Admitting: Cardiovascular Disease

## 2014-07-24 VITALS — BP 131/65 | HR 63 | Ht 72.0 in | Wt 181.0 lb

## 2014-07-24 DIAGNOSIS — I10 Essential (primary) hypertension: Secondary | ICD-10-CM

## 2014-07-24 DIAGNOSIS — C671 Malignant neoplasm of dome of bladder: Secondary | ICD-10-CM | POA: Diagnosis not present

## 2014-07-24 DIAGNOSIS — Z9289 Personal history of other medical treatment: Secondary | ICD-10-CM

## 2014-07-24 DIAGNOSIS — Z955 Presence of coronary angioplasty implant and graft: Secondary | ICD-10-CM | POA: Diagnosis not present

## 2014-07-24 DIAGNOSIS — I2510 Atherosclerotic heart disease of native coronary artery without angina pectoris: Secondary | ICD-10-CM | POA: Diagnosis not present

## 2014-07-24 DIAGNOSIS — I4891 Unspecified atrial fibrillation: Secondary | ICD-10-CM

## 2014-07-24 DIAGNOSIS — Z87898 Personal history of other specified conditions: Secondary | ICD-10-CM | POA: Diagnosis not present

## 2014-07-24 HISTORY — DX: Presence of coronary angioplasty implant and graft: Z95.5

## 2014-07-24 NOTE — Progress Notes (Signed)
Patient ID: Manuel Le, male   DOB: 23-Sep-1926, 80 y.o.   MRN: [id number]      SUBJECTIVE: The patient returns for follow up after undergoing bare metal stent placement to the ostium of the RCA on 07/08/14. He is feeling better and has regained some strength. He is able to walk better without giving out prematurely. He denies hematuria.   Review of Systems: As per "subjective", otherwise negative.  No Known Allergies  Current Outpatient Prescriptions  Medication Sig Dispense Refill  . acetaminophen (TYLENOL) 500 MG tablet Take 1,000 mg by mouth every 6 (six) hours as needed for mild pain. Pain    . amLODipine (NORVASC) 5 MG tablet Take 5 mg by mouth every morning.     Marland Kitchen aspirin EC 81 MG EC tablet Take 1 tablet (81 mg total) by mouth daily.    Marland Kitchen atorvastatin (LIPITOR) 20 MG tablet Take 1 tablet (20 mg total) by mouth daily at 6 PM. 30 tablet 11  . carvedilol (COREG) 3.125 MG tablet Take 1 tablet (3.125 mg total) by mouth 2 (two) times daily with a meal. 60 tablet 11  . cetirizine (ZYRTEC) 10 MG tablet Take 10 mg by mouth every morning.     . Cholecalciferol (VITAMIN D-3) 1000 UNITS CAPS Take 1 capsule by mouth daily.    . clopidogrel (PLAVIX) 75 MG tablet Take 1 tablet (75 mg total) by mouth daily. 30 tablet 11  . ferrous sulfate 325 (65 FE) MG EC tablet Take 325 mg by mouth daily with breakfast.    . losartan-hydrochlorothiazide (HYZAAR) 100-12.5 MG per tablet Take 1 tablet by mouth every morning.     . Misc Natural Products (OSTEO BI-FLEX ADV JOINT SHIELD) TABS Take 1 tablet by mouth 2 (two) times daily.     . Multiple Vitamins-Minerals (CENTRUM SILVER ADULT 50+) TABS Take 1 tablet by mouth every morning.    . nitroGLYCERIN (NITROSTAT) 0.4 MG SL tablet Place 1 tablet (0.4 mg total) under the tongue every 5 (five) minutes as needed for chest pain. 25 tablet 3  . vitamin B-12 (CYANOCOBALAMIN) 1000 MCG tablet Take 1,000 mcg by mouth daily.     No current facility-administered medications  for this visit.    Past Medical History  Diagnosis Date  . Hypertension   . Coronary artery disease   . Iron deficiency anemia   . Bladder cancer   . Hematuria   . Atrial fibrillation and flutter   . Mild obstructive sleep apnea     no cpap  . Exertional angina 07/08/2014  . Arthritis     OSTEO  . S/P coronary artery stent placement 07/24/2014    Past Surgical History  Procedure Laterality Date  . Colonoscopy  07/04/2012    Procedure: COLONOSCOPY;  Surgeon: Rogene Houston, MD;  Location: [facility];  Service: Endoscopy;  Laterality: N/A;  320  . Esophagogastroduodenoscopy  07/04/2012    Procedure: ESOPHAGOGASTRODUODENOSCOPY (EGD);  Surgeon: Rogene Houston, MD;  Location: [facility];  Service: Endoscopy;  Laterality: N/A;  . Cardiovascular stress test  12-15-2013  DR Kate Sable ([place])    MILD - MODERATE PERI-INFARCT ISCHEMIA  OF INFERIOR WALL, MID-INFEROSEPTAL WALL, MID-INFEROLATERAL WALL, & BASAL INFERIOR WALL/   NORMAL LVF /  EF 59%/  INTERMEDIATE RISK STUDY  . Transthoracic echocardiogram  09-11-2013    MODERATE LVH/  EF 55-60%/  MILD MR & TR/  MILD TO MODERATE CALCIFIED AV WITHOUT STENOSIS/  MILD LAE/   MODERATE  PR  . Total knee arthroplasty Bilateral 2000  &  2003?  Marland Kitchen Hernia repair Right   . Transurethral resection of bladder tumor with gyrus (turbt-gyrus) N/A 12/31/2013    Procedure: TRANSURETHRAL RESECTION OF BLADDER TUMOR WITH GYRUS (TURBT-GYRUS);  Surgeon: Alexis Frock, MD;  Location: [facility];  Service: Urology;  Laterality: N/A;  . Cystoscopy w/ retrogrades Bilateral 12/31/2013    Procedure: CYSTOSCOPY WITH RETROGRADE PYELOGRAM;  Surgeon: Alexis Frock, MD;  Location: [facility];  Service: Urology;  Laterality: Bilateral;  . Coronary stent placement  07/08/2014    PTCA of RCA   DR COOPER    History   Social History  . Marital Status: Married    Spouse Name: N/A    Number of Children: N/A  . Years of Education: N/A   Occupational History    . Not on file.   Social History Main Topics  . Smoking status: Former Research scientist (life sciences)  . Smokeless tobacco: Never Used     Comment: quit smoking in 1980  . Alcohol Use: No  . Drug Use: No  . Sexual Activity: Not on file   Other Topics Concern  . Not on file   Social History Narrative     Wt 181 lbs BP 131/65 Ht 6' HR 63 bpm Sats: 100% RA  PHYSICAL EXAM General: NAD  Neck: No JVD, no thyromegaly or thyroid nodule.  Lungs: Clear to auscultation bilaterally with normal respiratory effort.  CV: Nondisplaced PMI. Irregular rhythm, normal S1/S2, II/VI ejection systolic murmur at RUSB and II/VI apical holosystolic murmur. No peripheral edema. No carotid bruit. Normal pedal pulses.  Abdomen: Soft, nontender, no hepatosplenomegaly, no distention.  Skin: Intact without lesions or rashes.  Neurologic: Alert and oriented x 3.  Psych: Normal affect.  Extremities: No clubbing or cyanosis.  HEENT: Normal.   ECG: Most recent ECG reviewed.      ASSESSMENT AND PLAN: 1. CAD s/p BMS to RCA: Will plan to use Plavix x 30 days total (longer if feasible) along with 81 mg ASA, and then continue ASA 325 mg (given atrial fibrillation and inability to use anticoagulants) indefinitely. Continue Lipitor 20 mg and Coreg 3.125 mg bid.  2. Atrial fibrillation/flutter: Warfarin has been held as he has been diagnosed with bladder cancer. He is now on low-dose Coreg after stent placement. His CHADS-Vasc score is 3, thus warranting anticoagulation as he is at high risk for a CVA. In addition to this, malignancy is associated with a hypercoagulable state. However, given the aforementioned diagnosis of bladder cancer with associated hematuria, I will hold off on reinstituting warfarin and will continue ASA 81mg  mg daily along with Plavix. Afterwards, will switch to ASA 325 mg daily.  3. Essential hypertension: Controlled today. No changes.  4. Bladder cancer: He has declined bladder removal and/or  chemotherapy/radiation treatments, and is pursuing a conservative strategy.    Dispo: f/u 2 months.    Kate Sable, M.D., F.A.C.C.

## 2014-07-24 NOTE — Patient Instructions (Addendum)
    Continue all medications, do not stop any unless told to do so by MD.   Follow up in 2 months.  SEE APPOINTMENT SCHEDULED ABOVE, IF THIS IS NOT CONVENIENT FOR YOU - PLEASE CALL THE OFFICE.

## 2014-07-24 NOTE — Addendum Note (Signed)
Addended by: Laurine Blazer on: 07/24/2014 10:19 AM   Modules accepted: Medications, Level of Service

## 2014-07-30 ENCOUNTER — Encounter (HOSPITAL_COMMUNITY): Payer: Self-pay | Admitting: Cardiovascular Disease

## 2014-08-19 DIAGNOSIS — Z79899 Other long term (current) drug therapy: Secondary | ICD-10-CM | POA: Diagnosis not present

## 2014-08-19 DIAGNOSIS — M839 Adult osteomalacia, unspecified: Secondary | ICD-10-CM | POA: Diagnosis not present

## 2014-08-19 DIAGNOSIS — R5383 Other fatigue: Secondary | ICD-10-CM | POA: Diagnosis not present

## 2014-08-19 DIAGNOSIS — M791 Myalgia: Secondary | ICD-10-CM | POA: Diagnosis not present

## 2014-09-07 DIAGNOSIS — M791 Myalgia: Secondary | ICD-10-CM | POA: Diagnosis not present

## 2014-09-07 DIAGNOSIS — K1379 Other lesions of oral mucosa: Secondary | ICD-10-CM | POA: Diagnosis not present

## 2014-09-14 DIAGNOSIS — R310 Gross hematuria: Secondary | ICD-10-CM | POA: Diagnosis not present

## 2014-09-14 DIAGNOSIS — N200 Calculus of kidney: Secondary | ICD-10-CM | POA: Diagnosis not present

## 2014-09-14 DIAGNOSIS — C670 Malignant neoplasm of trigone of bladder: Secondary | ICD-10-CM | POA: Diagnosis not present

## 2014-09-30 ENCOUNTER — Encounter: Payer: Self-pay | Admitting: Cardiovascular Disease

## 2014-09-30 ENCOUNTER — Ambulatory Visit (INDEPENDENT_AMBULATORY_CARE_PROVIDER_SITE_OTHER): Payer: Medicare Other | Admitting: Cardiovascular Disease

## 2014-09-30 VITALS — BP 151/63 | HR 56 | Ht 72.0 in | Wt 182.0 lb

## 2014-09-30 DIAGNOSIS — I4891 Unspecified atrial fibrillation: Secondary | ICD-10-CM

## 2014-09-30 DIAGNOSIS — R5383 Other fatigue: Secondary | ICD-10-CM

## 2014-09-30 DIAGNOSIS — C671 Malignant neoplasm of dome of bladder: Secondary | ICD-10-CM

## 2014-09-30 DIAGNOSIS — I10 Essential (primary) hypertension: Secondary | ICD-10-CM

## 2014-09-30 DIAGNOSIS — I2510 Atherosclerotic heart disease of native coronary artery without angina pectoris: Secondary | ICD-10-CM | POA: Diagnosis not present

## 2014-09-30 DIAGNOSIS — Z955 Presence of coronary angioplasty implant and graft: Secondary | ICD-10-CM | POA: Diagnosis not present

## 2014-09-30 MED ORDER — CARVEDILOL 3.125 MG PO TABS: 3.1250 mg | ORAL_TABLET | Freq: Every evening | ORAL | Status: DC

## 2014-09-30 NOTE — Patient Instructions (Signed)
   Decrease Coreg to 3.125mg  every evening Continue all other medications.   Follow up in  3-4 months

## 2014-09-30 NOTE — Progress Notes (Signed)
Patient ID: Manuel Le, male   DOB: Sep 05, 1926, 81 y.o.   MRN: [id number]      SUBJECTIVE: [name] underwent bare metal stent placement to the ostium of the RCA on 07/08/14. He denies chest pain, shortness of breath, bleeding problems, and dizziness. He has felt more weak and sluggish and wonders whether it is due to the [place].   Review of Systems: As per "subjective", otherwise negative.  No Known Allergies  Current Outpatient Prescriptions  Medication Sig Dispense Refill  . acetaminophen (TYLENOL) 500 MG tablet Take 1,000 mg by mouth every 6 (six) hours as needed for mild pain. Pain    . amLODipine (NORVASC) 5 MG tablet Take 5 mg by mouth every morning.     Marland Kitchen aspirin EC 81 MG EC tablet Take 1 tablet (81 mg total) by mouth daily.    Marland Kitchen atorvastatin (LIPITOR) 20 MG tablet Take 1 tablet (20 mg total) by mouth daily at 6 PM. 30 tablet 11  . carvedilol (COREG) 3.125 MG tablet Take 1 tablet (3.125 mg total) by mouth 2 (two) times daily with a meal. 60 tablet 11  . cetirizine (ZYRTEC) 10 MG tablet Take 10 mg by mouth every morning.     . Cholecalciferol (VITAMIN D-3) 1000 UNITS CAPS Take 1 capsule by mouth daily.    . clopidogrel (PLAVIX) 75 MG tablet Take 1 tablet (75 mg total) by mouth daily. 30 tablet 11  . ferrous sulfate 325 (65 FE) MG EC tablet Take 325 mg by mouth daily with breakfast.    . losartan-hydrochlorothiazide (HYZAAR) 100-12.5 MG per tablet Take 1 tablet by mouth every morning.     . Misc Natural Products (OSTEO BI-FLEX ADV JOINT SHIELD) TABS Take 1 tablet by mouth 2 (two) times daily.     . Multiple Vitamins-Minerals (CENTRUM SILVER ADULT 50+) TABS Take 1 tablet by mouth every morning.    . nitroGLYCERIN (NITROSTAT) 0.4 MG SL tablet Place 1 tablet (0.4 mg total) under the tongue every 5 (five) minutes as needed for chest pain. 25 tablet 3  . vitamin B-12 (CYANOCOBALAMIN) 1000 MCG tablet Take 1,000 mcg by mouth daily.     No current facility-administered medications for  this visit.    Past Medical History  Diagnosis Date  . Hypertension   . Coronary artery disease   . Iron deficiency anemia   . Bladder cancer   . Hematuria   . Atrial fibrillation and flutter   . Mild obstructive sleep apnea     no cpap  . Exertional angina 07/08/2014  . Arthritis     OSTEO  . S/P coronary artery stent placement 07/24/2014    Past Surgical History  Procedure Laterality Date  . Colonoscopy  07/04/2012    Procedure: COLONOSCOPY;  Surgeon: Rogene Houston, MD;  [facility]: [facility];  Service: Endoscopy;  Laterality: N/A;  320  . Esophagogastroduodenoscopy  07/04/2012    Procedure: ESOPHAGOGASTRODUODENOSCOPY (EGD);  Surgeon: Rogene Houston, MD;  Location: [facility];  Service: Endoscopy;  Laterality: N/A;  . Cardiovascular stress test  12-15-2013  DR Kate Sable ([place])    MILD - MODERATE PERI-INFARCT ISCHEMIA  OF INFERIOR WALL, MID-INFEROSEPTAL WALL, MID-INFEROLATERAL WALL, & BASAL INFERIOR WALL/   NORMAL LVF /  EF 59%/  INTERMEDIATE RISK STUDY  . Transthoracic echocardiogram  09-11-2013    MODERATE LVH/  EF 55-60%/  MILD MR & TR/  MILD TO MODERATE CALCIFIED AV WITHOUT STENOSIS/  MILD LAE/   MODERATE  PR  . Total knee arthroplasty Bilateral 2000  &  2003?  Marland Kitchen Hernia repair Right   . Transurethral resection of bladder tumor with gyrus (turbt-gyrus) N/A 12/31/2013    Procedure: TRANSURETHRAL RESECTION OF BLADDER TUMOR WITH GYRUS (TURBT-GYRUS);  Surgeon: Alexis Frock, MD;  Location: [facility];  Service: Urology;  Laterality: N/A;  . Cystoscopy w/ retrogrades Bilateral 12/31/2013    Procedure: CYSTOSCOPY WITH RETROGRADE PYELOGRAM;  Surgeon: Alexis Frock, MD;  Location: [facility];  Service: Urology;  Laterality: Bilateral;  . Coronary stent placement  07/08/2014    PTCA of RCA   DR COOPER  . Left heart catheterization with coronary angiogram N/A 07/08/2014    Procedure: LEFT HEART CATHETERIZATION WITH CORONARY ANGIOGRAM;  Surgeon: Blane Ohara,  MD;  Location: [facility] CATH LAB;  Service: Cardiovascular;  Laterality: N/A;  . Percutaneous coronary stent intervention (pci-s)  07/08/2014    Procedure: PERCUTANEOUS CORONARY STENT INTERVENTION (PCI-S);  Surgeon: Blane Ohara, MD;  Location: [facility] CATH LAB;  Service: Cardiovascular;;    History   Social History  . Marital Status: Married    Spouse Name: N/A  . Number of Children: N/A  . Years of Education: N/A   Occupational History  . Not on file.   Social History Main Topics  . Smoking status: Former Research scientist (life sciences)  . Smokeless tobacco: Never Used     Comment: quit smoking in 1980  . Alcohol Use: No  . Drug Use: No  . Sexual Activity: Not on file   Other Topics Concern  . Not on file   Social History Narrative     Filed Vitals:   09/30/14 0857  BP: 151/63  Pulse: 56  Height: 6' (1.829 m)  Weight: 182 lb (82.555 kg)  SpO2: 100%    PHYSICAL EXAM General: NAD  Neck: No JVD, no thyromegaly or thyroid nodule.  Lungs: Clear to auscultation bilaterally with normal respiratory effort.  CV: Nondisplaced PMI. Irregular rhythm, normal S1/S2, no S3,  II/VI ejection systolic murmur at RUSB and II/VI apical holosystolic murmur. No peripheral edema.   Abdomen: Soft, nontender, no distention.  Skin: Intact without lesions or rashes.  Neurologic: Alert and oriented x 3.  Psych: Normal affect.  Extremities: No clubbing or cyanosis.  HEENT: Normal.   ECG: Most recent ECG reviewed.    ASSESSMENT AND PLAN: 1. CAD s/p BMS to RCA: Will plan to use Plavix for possibly another 2 months if possible along with 81 mg ASA, and then continue ASA 325 mg (given atrial fibrillation and inability to use anticoagulants) indefinitely. Continue Lipitor 20 mg. Due to fatigue and sluggishness, will reduce Coreg to 3.125 mg q pm only.  2. Atrial fibrillation/flutter: Warfarin has been held as he has been diagnosed with bladder cancer. He has been on low-dose Coreg after stent placement, which I  will reduce further as noted above due to fatigue and sluggishness. His CHADS-Vasc score is 3, thus warranting anticoagulation as he is at high risk for a CVA. In addition to this, malignancy is associated with a hypercoagulable state. However, given the aforementioned diagnosis of bladder cancer with associated hematuria, I will hold off on reinstituting warfarin and will continue ASA 81mg  mg daily along with Plavix. Afterwards, will switch to ASA 325 mg daily.  3. Essential hypertension: Mildly elevated today. Will monitor given reduction in Coreg dose.   4. Bladder cancer: He has declined bladder removal and/or chemotherapy/radiation treatments, and is pursuing a conservative strategy.    Dispo: f/u 3-4 months.  Kate Sable, M.D., F.A.C.C.

## 2014-10-28 DIAGNOSIS — H538 Other visual disturbances: Secondary | ICD-10-CM | POA: Diagnosis not present

## 2014-10-28 DIAGNOSIS — Z961 Presence of intraocular lens: Secondary | ICD-10-CM | POA: Diagnosis not present

## 2014-11-09 DIAGNOSIS — Z6823 Body mass index (BMI) 23.0-23.9, adult: Secondary | ICD-10-CM | POA: Diagnosis not present

## 2014-11-09 DIAGNOSIS — J069 Acute upper respiratory infection, unspecified: Secondary | ICD-10-CM | POA: Diagnosis not present

## 2015-01-11 ENCOUNTER — Other Ambulatory Visit: Payer: Self-pay | Admitting: Urology

## 2015-01-11 DIAGNOSIS — R310 Gross hematuria: Secondary | ICD-10-CM | POA: Diagnosis not present

## 2015-01-11 DIAGNOSIS — C670 Malignant neoplasm of trigone of bladder: Secondary | ICD-10-CM | POA: Diagnosis not present

## 2015-01-11 DIAGNOSIS — N200 Calculus of kidney: Secondary | ICD-10-CM | POA: Diagnosis not present

## 2015-01-12 NOTE — Progress Notes (Signed)
Called requested release of orders to Epic surgery 01-20-15 pre op 01-15-15 Thanks

## 2015-01-13 DIAGNOSIS — N400 Enlarged prostate without lower urinary tract symptoms: Secondary | ICD-10-CM | POA: Diagnosis not present

## 2015-01-13 DIAGNOSIS — N200 Calculus of kidney: Secondary | ICD-10-CM | POA: Diagnosis not present

## 2015-01-13 DIAGNOSIS — C670 Malignant neoplasm of trigone of bladder: Secondary | ICD-10-CM | POA: Diagnosis not present

## 2015-01-13 DIAGNOSIS — K4090 Unilateral inguinal hernia, without obstruction or gangrene, not specified as recurrent: Secondary | ICD-10-CM | POA: Diagnosis not present

## 2015-01-13 DIAGNOSIS — C679 Malignant neoplasm of bladder, unspecified: Secondary | ICD-10-CM | POA: Diagnosis not present

## 2015-01-15 ENCOUNTER — Encounter (HOSPITAL_COMMUNITY): Payer: Self-pay

## 2015-01-15 ENCOUNTER — Encounter (HOSPITAL_COMMUNITY)
Admission: RE | Admit: 2015-01-15 | Discharge: 2015-01-15 | Disposition: A | Discharge status: Home / Self Care | Payer: Medicare Other | Source: Ambulatory Visit | Attending: Urology | Admitting: Urology

## 2015-01-15 ENCOUNTER — Telehealth: Payer: Self-pay | Admitting: Cardiovascular Disease

## 2015-01-15 DIAGNOSIS — Z01818 Encounter for other preprocedural examination: Secondary | ICD-10-CM | POA: Diagnosis not present

## 2015-01-15 DIAGNOSIS — C679 Malignant neoplasm of bladder, unspecified: Secondary | ICD-10-CM | POA: Insufficient documentation

## 2015-01-15 HISTORY — DX: Calculus of kidney: N20.0

## 2015-01-15 HISTORY — DX: Weakness: R53.1

## 2015-01-15 HISTORY — DX: Cardiac arrhythmia, unspecified: I49.9

## 2015-01-15 HISTORY — DX: Personal history of other specified conditions: Z87.898

## 2015-01-15 LAB — CBC
HEMATOCRIT: 38.8 % — AB (ref 39.0–52.0)
HEMOGLOBIN: 12.9 g/dL — AB (ref 13.0–17.0)
MCH: 30.1 pg (ref 26.0–34.0)
MCHC: 33.2 g/dL (ref 30.0–36.0)
MCV: 90.7 fL (ref 78.0–100.0)
PLATELETS: 181 10*3/uL (ref 150–400)
RBC: 4.28 MIL/uL (ref 4.22–5.81)
RDW: 13 % (ref 11.5–15.5)
WBC: 5.6 10*3/uL (ref 4.0–10.5)

## 2015-01-15 LAB — BASIC METABOLIC PANEL
Anion gap: 11 (ref 5–15)
BUN: 23 mg/dL — ABNORMAL HIGH (ref 6–20)
CO2: 28 mmol/L (ref 22–32)
Calcium: 9.6 mg/dL (ref 8.9–10.3)
Chloride: 104 mmol/L (ref 101–111)
Creatinine, Ser: 0.73 mg/dL (ref 0.61–1.24)
Glucose, Bld: 104 mg/dL — ABNORMAL HIGH (ref 65–99)
Potassium: 4.1 mmol/L (ref 3.5–5.1)
SODIUM: 143 mmol/L (ref 135–145)

## 2015-01-15 NOTE — Progress Notes (Signed)
BMP results in epic per PAT visit 01/15/2015 sent to Dr Tresa Moore

## 2015-01-15 NOTE — Telephone Encounter (Signed)
Received a telephone call from [place] @ [facility] [phone])  Manuel Le is scheduled for surgery on 01/20/15. Anesthesiologist is requesting a note from Dr. Bronson Ing Be put into Epic for pre-op clearance.

## 2015-01-15 NOTE — Progress Notes (Signed)
Clearance note per epic per Dr Bronson Ing / cardiology EKG epic 07/09/2014 ECHO 09/11/2013 Stress test epic 12/15/2013

## 2015-01-15 NOTE — Telephone Encounter (Signed)
Epic is showing transurethral resection bladder tumor.  Last seen in office 09/30/2014.

## 2015-01-15 NOTE — Progress Notes (Addendum)
Spoke with Dr Delma Post / Anesthesia in regards to pts H&P. This nurse informed that have placed call to cardiologist to request clearance note. Awaiting clearance per cardiology.  Pt stated during PAT visit 01/14/2015 that he stopped taking all his medications as of this morning. This  Nurse tried to clarify what meds.  Pt stated Dr Tresa Moore had sent note stating which medications he was to stop but pt did not have note with him. Pt thought it was all medications. This nurse instructed pt of need to review Dr Zettie Pho instruction when gets home. Pt and his nephew verbalized understanding. This nurse called [facility] VM message left with Selita and also Chasity in regards to pts confusion on medications and need for clarification. The MD office / Dr Zettie Pho office did fax note that was sent to pt to PAT dept but did not receive till pt had left visit. This nurse instructed pt on which meds to take day of surgery and have left message for [facility] in regards to need to clarify with pt again on which meds to stop.

## 2015-01-15 NOTE — Telephone Encounter (Signed)
Can proceed with low to intermediate risk for major adverse cardiac event given age and comorbidities.

## 2015-01-15 NOTE — Patient Instructions (Signed)
GEARY RUFO  01/15/2015   Your procedure is scheduled on: Wednesday January 20, 2015  Report to [facility] Main  Entrance and follow signs to               [facility] an arrive at 0600 AM.  Call this number if you have problems the morning of surgery [phone]   Remember: ONLY 1 PERSON MAY GO WITH YOU TO SHORT STAY TO GET  READY MORNING OF [place].  Do not eat food or drink liquids :After Midnight.     Take these medicines the morning of surgery with A SIP OF WATER: Amlodipine (Norvasc); Carvedilol (Coreg); Cetirizine (Zyrtec)                               You may not have any metal on your body including hair pins and              piercings  Do not wear jewelry,colognes,lotions, powders or, deodorant             Men may shave face and neck.   Do not bring valuables to the hospital. [place].  Contacts, dentures or bridgework may not be worn into surgery.      Patients discharged the day of surgery will not be allowed to drive home.  Name and phone number of your driver: [name] (nephew)  Special Instructions: N/A            _____________________________________________________________________             [facility] - Preparing for Surgery Before surgery, you can play an important role.  Because skin is not sterile, your skin needs to be as free of germs as possible.  You can reduce the number of germs on your skin by washing with CHG (chlorahexidine gluconate) soap before surgery.  CHG is an antiseptic cleaner which kills germs and bonds with the skin to continue killing germs even after washing. Please DO NOT use if you have an allergy to CHG or antibacterial soaps.  If your skin becomes reddened/irritated stop using the CHG and inform your nurse when you arrive at Short Stay. Do not shave (including legs and underarms) for at least 48 hours prior to the first CHG shower.  You may shave  your face/neck. Please follow these instructions carefully:  1.  Shower with CHG Soap the night before surgery and the  morning of Surgery.  2.  If you choose to wash your hair, wash your hair first as usual with your  normal  shampoo.  3.  After you shampoo, rinse your hair and body thoroughly to remove the  shampoo.                           4.  Use CHG as you would any other liquid soap.  You can apply chg directly  to the skin and wash                       Gently with a scrungie or clean washcloth.  5.  Apply the CHG Soap to your body ONLY FROM THE NECK DOWN.   Do not use  on face/ open                           Wound or open sores. Avoid contact with eyes, ears mouth and genitals (private parts).                       Wash face,  Genitals (private parts) with your normal soap.             6.  Wash thoroughly, paying special attention to the area where your surgery  will be performed.  7.  Thoroughly rinse your body with warm water from the neck down.  8.  DO NOT shower/wash with your normal soap after using and rinsing off  the CHG Soap.                9.  Pat yourself dry with a clean towel.            10.  Wear clean pajamas.            11.  Place clean sheets on your bed the night of your first shower and do not  sleep with pets. Day of Surgery : Do not apply any lotions/deodorants the morning of surgery.  Please wear clean clothes to the hospital/surgery center.  FAILURE TO FOLLOW THESE INSTRUCTIONS MAY RESULT IN THE CANCELLATION OF YOUR SURGERY PATIENT SIGNATURE_________________________________  NURSE SIGNATURE__________________________________  ________________________________________________________________________

## 2015-01-19 MED ORDER — DEXTROSE 5 % IV SOLN
5.0000 mg/kg | INTRAVENOUS | Status: AC
Start: 2015-01-20 — End: 2015-01-20
  Administered 2015-01-20: 400 mg via INTRAVENOUS
  Filled 2015-01-19: qty 10

## 2015-01-19 NOTE — Anesthesia Preprocedure Evaluation (Addendum)
Anesthesia Evaluation  Patient identified by MRN, date of birth, ID band Patient awake    Reviewed: Allergy & Precautions, NPO status , Patient's Chart, lab work & pertinent test results  History of Anesthesia Complications Negative for: history of anesthetic complications  Airway Mallampati: II  TM Distance: >3 FB Neck ROM: Limited    Dental  (+) Teeth Intact, Dental Advisory Given   Pulmonary sleep apnea , former smoker,    Pulmonary exam normal       Cardiovascular hypertension, + angina + CAD and + Cardiac Stents Normal cardiovascular exam+ dysrhythmias  Left ventricle: The cavity size was normal. Wall thickness was increased in a pattern of moderate LVH. Systolic function was normal. The estimated ejection fraction was in the range of 55% to 60%. Wall motion was normal; there were no regional wall motion abnormalities. The study was not technically sufficient to allow evaluation of LV diastolic dysfunction due to atrial fibrillation.   Myocardial Perfusion scan 2015: normal   Neuro/Psych negative neurological ROS  negative psych ROS   GI/Hepatic negative GI ROS, Neg liver ROS,   Endo/Other  negative endocrine ROS  Renal/GU      Musculoskeletal   Abdominal   Peds  Hematology   Anesthesia Other Findings   Reproductive/Obstetrics                          Anesthesia Physical Anesthesia Plan  ASA: III  Anesthesia Plan: General   Post-op Pain Management:    Induction: Intravenous  Airway Management Planned: LMA  Additional Equipment:   Intra-op Plan:   Post-operative Plan: Extubation in OR  Informed Consent: I have reviewed the patients History and Physical, chart, labs and discussed the procedure including the risks, benefits and alternatives for the proposed anesthesia with the patient or authorized representative who has indicated his/her understanding and  acceptance.   Dental advisory given  Plan Discussed with: CRNA, Anesthesiologist and Surgeon  Anesthesia Plan Comments:       Anesthesia Quick Evaluation

## 2015-01-20 ENCOUNTER — Ambulatory Visit (HOSPITAL_COMMUNITY)
Admission: RE | Admit: 2015-01-20 | Discharge: 2015-01-20 | Disposition: A | Discharge status: Home / Self Care | Payer: Medicare Other | Source: Ambulatory Visit | Attending: Urology | Admitting: Urology

## 2015-01-20 ENCOUNTER — Encounter (HOSPITAL_COMMUNITY)
Admission: RE | Disposition: A | Discharge status: Home / Self Care | Payer: Self-pay | Source: Ambulatory Visit | Attending: Urology

## 2015-01-20 ENCOUNTER — Encounter (HOSPITAL_COMMUNITY): Payer: Self-pay | Admitting: *Deleted

## 2015-01-20 ENCOUNTER — Ambulatory Visit (HOSPITAL_COMMUNITY): Payer: Medicare Other | Admitting: Anesthesiology

## 2015-01-20 DIAGNOSIS — Z955 Presence of coronary angioplasty implant and graft: Secondary | ICD-10-CM | POA: Diagnosis not present

## 2015-01-20 DIAGNOSIS — Z79899 Other long term (current) drug therapy: Secondary | ICD-10-CM | POA: Diagnosis not present

## 2015-01-20 DIAGNOSIS — M1990 Unspecified osteoarthritis, unspecified site: Secondary | ICD-10-CM | POA: Diagnosis not present

## 2015-01-20 DIAGNOSIS — I4892 Unspecified atrial flutter: Secondary | ICD-10-CM | POA: Insufficient documentation

## 2015-01-20 DIAGNOSIS — D509 Iron deficiency anemia, unspecified: Secondary | ICD-10-CM | POA: Diagnosis not present

## 2015-01-20 DIAGNOSIS — Z87891 Personal history of nicotine dependence: Secondary | ICD-10-CM | POA: Insufficient documentation

## 2015-01-20 DIAGNOSIS — I2510 Atherosclerotic heart disease of native coronary artery without angina pectoris: Secondary | ICD-10-CM | POA: Insufficient documentation

## 2015-01-20 DIAGNOSIS — Z7982 Long term (current) use of aspirin: Secondary | ICD-10-CM | POA: Insufficient documentation

## 2015-01-20 DIAGNOSIS — I10 Essential (primary) hypertension: Secondary | ICD-10-CM | POA: Diagnosis not present

## 2015-01-20 DIAGNOSIS — G4733 Obstructive sleep apnea (adult) (pediatric): Secondary | ICD-10-CM | POA: Insufficient documentation

## 2015-01-20 DIAGNOSIS — Z87442 Personal history of urinary calculi: Secondary | ICD-10-CM | POA: Diagnosis not present

## 2015-01-20 DIAGNOSIS — C670 Malignant neoplasm of trigone of bladder: Secondary | ICD-10-CM | POA: Diagnosis not present

## 2015-01-20 DIAGNOSIS — I4891 Unspecified atrial fibrillation: Secondary | ICD-10-CM | POA: Insufficient documentation

## 2015-01-20 DIAGNOSIS — C679 Malignant neoplasm of bladder, unspecified: Secondary | ICD-10-CM | POA: Insufficient documentation

## 2015-01-20 HISTORY — PX: TRANSURETHRAL RESECTION OF BLADDER TUMOR: SHX2575

## 2015-01-20 HISTORY — PX: CYSTOSCOPY W/ RETROGRADES: SHX1426

## 2015-01-20 SURGERY — TURBT (TRANSURETHRAL RESECTION OF BLADDER TUMOR)
Anesthesia: General

## 2015-01-20 MED ORDER — EPHEDRINE SULFATE 50 MG/ML IJ SOLN
INTRAMUSCULAR | Status: DC | PRN
  Administered 2015-01-20 (×2): 5 mg via INTRAVENOUS

## 2015-01-20 MED ORDER — HYDROMORPHONE HCL 1 MG/ML IJ SOLN: 0.2500 mg | INTRAMUSCULAR | Status: DC | PRN

## 2015-01-20 MED ORDER — PROMETHAZINE HCL 25 MG/ML IJ SOLN
6.2500 mg | INTRAMUSCULAR | Status: DC | PRN
Start: 2015-01-20 — End: 2015-01-20

## 2015-01-20 MED ORDER — LIDOCAINE HCL (CARDIAC) 20 MG/ML IV SOLN
INTRAVENOUS | Status: AC
  Filled 2015-01-20: qty 5

## 2015-01-20 MED ORDER — TRAMADOL HCL 50 MG PO TABS
50.0000 mg | ORAL_TABLET | Freq: Four times a day (QID) | ORAL | Status: DC
  Administered 2015-01-20: 50 mg via ORAL

## 2015-01-20 MED ORDER — FENTANYL CITRATE (PF) 100 MCG/2ML IJ SOLN
INTRAMUSCULAR | Status: DC | PRN
Start: 2015-01-20 — End: 2015-01-20
  Administered 2015-01-20: 50 ug via INTRAVENOUS

## 2015-01-20 MED ORDER — SODIUM CHLORIDE 0.9 % IR SOLN
Status: DC | PRN
  Administered 2015-01-20: 3000 mL via INTRAVESICAL

## 2015-01-20 MED ORDER — ONDANSETRON HCL 4 MG/2ML IJ SOLN
INTRAMUSCULAR | Status: AC
  Filled 2015-01-20: qty 2

## 2015-01-20 MED ORDER — PROPOFOL 10 MG/ML IV BOLUS
INTRAVENOUS | Status: DC | PRN
  Administered 2015-01-20: 180 mg via INTRAVENOUS

## 2015-01-20 MED ORDER — TRAMADOL HCL 50 MG PO TABS: 50.0000 mg | ORAL_TABLET | Freq: Four times a day (QID) | ORAL | Status: DC | PRN

## 2015-01-20 MED ORDER — ONDANSETRON HCL 4 MG/2ML IJ SOLN
INTRAMUSCULAR | Status: DC | PRN
  Administered 2015-01-20: 4 mg via INTRAVENOUS

## 2015-01-20 MED ORDER — PHENYLEPHRINE HCL 10 MG/ML IJ SOLN
INTRAMUSCULAR | Status: AC
  Filled 2015-01-20: qty 1

## 2015-01-20 MED ORDER — PROPOFOL 10 MG/ML IV BOLUS
INTRAVENOUS | Status: AC
  Filled 2015-01-20: qty 20

## 2015-01-20 MED ORDER — PHENYLEPHRINE HCL 10 MG/ML IJ SOLN
INTRAMUSCULAR | Status: DC | PRN
  Administered 2015-01-20: 80 ug via INTRAVENOUS
  Administered 2015-01-20 (×2): 40 ug via INTRAVENOUS

## 2015-01-20 MED ORDER — LACTATED RINGERS IV SOLN
INTRAVENOUS | Status: DC | PRN
  Administered 2015-01-20 (×2): via INTRAVENOUS

## 2015-01-20 MED ORDER — MIDAZOLAM HCL 2 MG/2ML IJ SOLN
INTRAMUSCULAR | Status: AC
  Filled 2015-01-20: qty 2

## 2015-01-20 MED ORDER — LIDOCAINE HCL (CARDIAC) 20 MG/ML IV SOLN
INTRAVENOUS | Status: DC | PRN
  Administered 2015-01-20: 80 mg via INTRAVENOUS

## 2015-01-20 MED ORDER — FENTANYL CITRATE (PF) 250 MCG/5ML IJ SOLN
INTRAMUSCULAR | Status: AC
  Filled 2015-01-20: qty 5

## 2015-01-20 MED ORDER — IOHEXOL 300 MG/ML  SOLN
INTRAMUSCULAR | Status: DC | PRN
  Administered 2015-01-20: 17 mL

## 2015-01-20 MED ORDER — DEXAMETHASONE SODIUM PHOSPHATE 10 MG/ML IJ SOLN
INTRAMUSCULAR | Status: AC
  Filled 2015-01-20: qty 1

## 2015-01-20 SURGICAL SUPPLY — 14 items
BAG URINE DRAINAGE (UROLOGICAL SUPPLIES) IMPLANT
BAG URO CATCHER STRL LF (DRAPE) ×3 IMPLANT
CATH INTERMIT  6FR 70CM (CATHETERS) ×3 IMPLANT
ELECT REM PT RETURN 9FT ADLT (ELECTROSURGICAL) ×3
ELECTRODE REM PT RTRN 9FT ADLT (ELECTROSURGICAL) ×2 IMPLANT
EVACUATOR MICROVAS BLADDER (UROLOGICAL SUPPLIES) IMPLANT
GLOVE BIOGEL M STRL SZ7.5 (GLOVE) ×3 IMPLANT
GOWN STRL REUS W/TWL LRG LVL3 (GOWN DISPOSABLE) ×12 IMPLANT
KIT ASPIRATION TUBING (SET/KITS/TRAYS/PACK) IMPLANT
LOOP CUT BIPOLAR 24F LRG (ELECTROSURGICAL) ×3 IMPLANT
MANIFOLD NEPTUNE II (INSTRUMENTS) ×3 IMPLANT
PACK CYSTO (CUSTOM PROCEDURE TRAY) ×3 IMPLANT
SYRINGE IRR TOOMEY STRL 70CC (SYRINGE) IMPLANT
TUBING CONNECTING 10 (TUBING) ×3 IMPLANT

## 2015-01-20 NOTE — H&P (Signed)
Manuel Le is an 81 y.o. male.    Chief Complaint: Pre-OP Transurethral Resection of Bladder TUmor  HPI:   1 - Gross Hematuria - New painless hematuria 11/2013 on / off x several, some with clots. UCX at time negative. Non smoker. CT Urogram 11/2013 with several enhancing bladder lesions, formed clot in bladder. Cysto 12/2013 with large dome tumor.  2 - Lower Urinary Tract Symptoms - Pt with worsened obstructive and irritative urinary symptoms correlated with hematuria. No baseline bother whatsoever. DRE 45gm smooth 11/2013.   3 - Nephrolithiasis -  Pre 2015 - passage with medical therapy x 4-5, none in past 10 years, no surgeries. CT Urogram 11/2013 with scattered punctate only, no hydro.  4 - High-Grade Muscle-Invasive Bladder Cancer -  12/2013 - T2G3 urothelial carcinoma with micropapillary features by TURBT. CT Urogram clinically localized. --> palliative / prn approach with Q91mo cysto and TURBT for symptoms 04/2014 - Surv Cysto - no gross disease,  08/2014 - Surv cysto - no gross disease; 12/2014 Surv Cysto - early dome recurrence approx 2cm area.  PMH sig for AFib/coumadin (no CVA or MI, follows  Dr Bronson Ing with [facility] ), knee replacement, HTN.   Today Manuel Le is seen to proceed with TURBT for his early bladder cancer recurrence. He has held cooumadin as instructed. Most recent UA wihtou infections parameters.   Past Medical History  Diagnosis Date  . Hypertension   . Coronary artery disease   . Iron deficiency anemia   . Bladder cancer   . Hematuria   . Atrial fibrillation and flutter   . Mild obstructive sleep apnea     no cpap  . Exertional angina 07/08/2014  . Arthritis     OSTEO  . S/P coronary artery stent placement 07/24/2014  . Dysrhythmia   . Kidney stones     hx of   . H/O urinary frequency   . Weakness     Past Surgical History  Procedure Laterality Date  . Colonoscopy  07/04/2012    Procedure: COLONOSCOPY;  Surgeon: Rogene Houston, MD;  [facility];  Service: Endoscopy;  Laterality: N/A;  320  . Esophagogastroduodenoscopy  07/04/2012    Procedure: ESOPHAGOGASTRODUODENOSCOPY (EGD);  Surgeon: Rogene Houston, MD;  [facility]: [facility];  Service: Endoscopy;  Laterality: N/A;  . Cardiovascular stress test  12-15-2013  DR Kate Sable ([place])    MILD - MODERATE PERI-INFARCT ISCHEMIA  OF INFERIOR WALL, MID-INFEROSEPTAL WALL, MID-INFEROLATERAL WALL, & BASAL INFERIOR WALL/   NORMAL LVF /  EF 59%/  INTERMEDIATE RISK STUDY  . Transthoracic echocardiogram  09-11-2013    MODERATE LVH/  EF 55-60%/  MILD MR & TR/  MILD TO MODERATE CALCIFIED AV WITHOUT STENOSIS/  MILD LAE/   MODERATE PR  . Total knee arthroplasty Bilateral 2000  &  2003?  Marland Kitchen Hernia repair Right   . Transurethral resection of bladder tumor with gyrus (turbt-gyrus) N/A 12/31/2013    Procedure: TRANSURETHRAL RESECTION OF BLADDER TUMOR WITH GYRUS (TURBT-GYRUS);  Surgeon: Alexis Frock, MD;  Location: [facility];  Service: Urology;  Laterality: N/A;  . Cystoscopy w/ retrogrades Bilateral 12/31/2013    Procedure: CYSTOSCOPY WITH RETROGRADE PYELOGRAM;  Surgeon: Alexis Frock, MD;  Location: [facility];  Service: Urology;  Laterality: Bilateral;  . Coronary stent placement  07/08/2014    PTCA of RCA   DR COOPER  . Left heart catheterization with coronary angiogram N/A 07/08/2014    Procedure: LEFT HEART CATHETERIZATION  WITH CORONARY ANGIOGRAM;  Surgeon: Blane Ohara, MD;  Location: [facility] CATH LAB;  Service: Cardiovascular;  Laterality: N/A;  . Percutaneous coronary stent intervention (pci-s)  07/08/2014    Procedure: PERCUTANEOUS CORONARY STENT INTERVENTION (PCI-S);  Surgeon: Blane Ohara, MD;  Location: [facility] CATH LAB;  Service: Cardiovascular;;  . Eye surgery      past cataract surgery bilat    History reviewed. No pertinent family history. Social History:  reports that he quit smoking about 46 years ago. His smoking use included Cigarettes, Cigars, and Pipe. He has  a 40 pack-year smoking history. He has never used smokeless tobacco. He reports that he does not drink alcohol or use illicit drugs.  Allergies: No Known Allergies  Medications Prior to Admission  Medication Sig Dispense Refill  . acetaminophen (TYLENOL) 500 MG tablet Take 1,000 mg by mouth every 6 (six) hours as needed for mild pain. Pain    . amLODipine (NORVASC) 5 MG tablet Take 5 mg by mouth every morning.     Marland Kitchen aspirin EC 81 MG EC tablet Take 1 tablet (81 mg total) by mouth daily.    Marland Kitchen atorvastatin (LIPITOR) 20 MG tablet Take 1 tablet (20 mg total) by mouth daily at 6 PM. 30 tablet 11  . carvedilol (COREG) 3.125 MG tablet Take 1 tablet (3.125 mg total) by mouth every evening.    . cetirizine (ZYRTEC) 10 MG tablet Take 10 mg by mouth every morning.     . Cholecalciferol (VITAMIN D-3) 1000 UNITS CAPS Take 1 capsule by mouth daily.    . clopidogrel (PLAVIX) 75 MG tablet Take 1 tablet (75 mg total) by mouth daily. 30 tablet 11  . ferrous sulfate 325 (65 FE) MG EC tablet Take 325 mg by mouth daily with breakfast.    . losartan-hydrochlorothiazide (HYZAAR) 100-25 MG per tablet Take 1 tablet by mouth daily.    . Misc Natural Products (OSTEO BI-FLEX ADV JOINT SHIELD) TABS Take 1 tablet by mouth 2 (two) times daily.     . Multiple Vitamins-Minerals (CENTRUM SILVER ADULT 50+) TABS Take 1 tablet by mouth every morning.    . nitroGLYCERIN (NITROSTAT) 0.4 MG SL tablet Place 1 tablet (0.4 mg total) under the tongue every 5 (five) minutes as needed for chest pain. 25 tablet 3  . vitamin B-12 (CYANOCOBALAMIN) 1000 MCG tablet Take 1,000 mcg by mouth daily.      No results found for this or any previous visit (from the past 48 hour(s)). No results found.  Review of Systems  Constitutional: Negative.  Negative for fever and chills.  HENT: Negative.   Eyes: Negative.   Respiratory: Negative.   Cardiovascular: Negative.   Gastrointestinal: Negative.   Genitourinary: Positive for hematuria.   Musculoskeletal: Negative.   Skin: Negative.   Neurological: Negative.   Endo/Heme/Allergies: Negative.   Psychiatric/Behavioral: Negative.     Blood pressure 120/66, pulse 56, temperature 97.7 F (36.5 C), temperature source Oral, resp. rate 16, height 6' (1.829 m), weight 80.74 kg (178 lb), SpO2 98 %. Physical Exam  Constitutional: He appears well-developed.  elderly  Eyes: Pupils are equal, round, and reactive to light.  Neck: Normal range of motion.  Cardiovascular: Normal rate.   Respiratory: Effort normal.  GI: Soft.  Genitourinary: Penis normal.  Musculoskeletal: Normal range of motion.  Neurological: He is alert.  Skin: Skin is warm.  Psychiatric: He has a normal mood and affect. His behavior is normal. Judgment and thought content normal.     Assessment/Plan  1 - Gross Hematuria - Likely 2/2 localized bladder cancer as per below. Tiny volume renal stones Unlikley significant contributer.   2 - Lower Urinary Tract Symptoms - hematuria w/u to r/o bladder mass as correlated time wise with hematuria. NO new meds as no baseline bother.   3 - Nephrolithiasis -  NO sig stones by recent CT.   4 - High-Grade Muscle-Invasive Bladder Cancer - proceed as planned with TURBT today for early recurrence. Risks, benefits alternatives reiterated.     Ronelle Michie 01/20/2015, 6:40 AM

## 2015-01-20 NOTE — Progress Notes (Signed)
Patient may resume Plavix tomorrow per Dr. Tresa Moore

## 2015-01-20 NOTE — Brief Op Note (Signed)
01/20/2015  9:08 AM  PATIENT:  Driscilla Moats  81 y.o. male  PRE-OPERATIVE DIAGNOSIS:  RECURRENT BLADDER CANCER  POST-OPERATIVE DIAGNOSIS:  RECURRENT BLADDER CANCER  PROCEDURE:  Procedure(s): TRANSURETHRAL RESECTION OF BLADDER TUMOR (TURBT) (N/A) CYSTOSCOPY WITH RETROGRADE PYELOGRAM (Bilateral)  SURGEON:  Surgeon(s) and Role:    * Alexis Frock, MD - Primary  PHYSICIAN ASSISTANT:   ASSISTANTS:  Curt Bears MD   ANESTHESIA:   general  EBL:     BLOOD ADMINISTERED:none  DRAINS: none   LOCAL MEDICATIONS USED:  NONE  SPECIMEN:  Source of Specimen:  recurrent bladder tumor  DISPOSITION OF SPECIMEN:  PATHOLOGY  COUNTS:  YES  TOURNIQUET:  * No tourniquets in log *  DICTATION: .Other Dictation: Dictation Number [id number]  PLAN OF CARE: Discharge to home after PACU  PATIENT DISPOSITION:  PACU - hemodynamically stable.   Delay start of Pharmacological VTE agent (>24hrs) due to surgical blood loss or risk of bleeding: yes

## 2015-01-20 NOTE — Transfer of Care (Signed)
Immediate Anesthesia Transfer of Care Note  Patient: Manuel Le  Procedure(s) Performed: Procedure(s): TRANSURETHRAL RESECTION OF BLADDER TUMOR (TURBT) (N/A) CYSTOSCOPY WITH RETROGRADE PYELOGRAM (Bilateral)  Patient Location: PACU  Anesthesia Type:General  Level of Consciousness: awake, oriented, patient cooperative, lethargic and responds to stimulation  Airway & Oxygen Therapy: Patient Spontanous Breathing and Patient connected to face mask oxygen  Post-op Assessment: Report given to RN, Post -op Vital signs reviewed and stable and Patient moving all extremities  Post vital signs: Reviewed and stable  Last Vitals:  Filed Vitals:   01/20/15 0555  BP: 120/66  Pulse: 56  Temp: 36.5 C  Resp: 16    Complications: No apparent anesthesia complications

## 2015-01-20 NOTE — Anesthesia Procedure Notes (Signed)
Procedure Name: LMA Insertion Date/Time: 01/20/2015 8:29 AM Performed by: Ofilia Neas Pre-anesthesia Checklist: Patient identified, Emergency Drugs available, Suction available, Patient being monitored and Timeout performed Patient Re-evaluated:Patient Re-evaluated prior to inductionOxygen Delivery Method: Circle system utilized Preoxygenation: Pre-oxygenation with 100% oxygen Intubation Type: IV induction LMA: LMA inserted LMA Size: 5.0 Number of attempts: 1 Dental Injury: Teeth and Oropharynx as per pre-operative assessment

## 2015-01-20 NOTE — Discharge Instructions (Signed)
1 - You may have urinary urgency (bladder spasms) and bloody urine on / off for up to 2 weeks.  This is normal.  2 - Call MD or go to ER for fever >102, severe pain / nausea / vomiting not relieved by medications, or acute change in medical status  

## 2015-01-20 NOTE — Anesthesia Postprocedure Evaluation (Signed)
Anesthesia Post Note  Patient: Manuel Le  Procedure(s) Performed: Procedure(s) (LRB): TRANSURETHRAL RESECTION OF BLADDER TUMOR (TURBT) (N/A) CYSTOSCOPY WITH RETROGRADE PYELOGRAM (Bilateral)  Anesthesia type: general  Patient location: PACU  Post pain: Pain level controlled  Post assessment: Patient's Cardiovascular Status Stable  Last Vitals:  Filed Vitals:   01/20/15 1000  BP: 151/55  Pulse: 64  Temp: 36.3 C  Resp: 12    Post vital signs: Reviewed and stable  Level of consciousness: sedated  Complications: No apparent anesthesia complications

## 2015-01-21 ENCOUNTER — Encounter (HOSPITAL_COMMUNITY): Payer: Self-pay | Admitting: Urology

## 2015-01-21 NOTE — Op Note (Signed)
NAMEKAIDEN, PECH NO.:  [id number]  MEDICAL RECORD NO.:  [id number]  LOCATION:                                 FACILITY:  PHYSICIAN:  Alexis Frock, MD     DATE OF BIRTH:  1927-07-25  DATE OF PROCEDURE:  01/20/2015                              OPERATIVE REPORT  DIAGNOSIS:  Recurrent bladder cancer.  PROCEDURES: 1. Transurethral resection of bladder tumor, volume medium. 2. Bilateral retrograde pyelogram with interpretation.  ESTIMATED BLOOD LOSS:  Nil.  COMPLICATIONS:  None.  SPECIMENS:  Fragments of recurrent bladder tumor for permanent pathology.  FINDINGS: 1. Recurrence at old resection site on the superior left bladder wall,     total volume approximately 3 cm. 2. Unremarkable bilateral retrograde pyelograms. 3. No evidence of bladder perforation and there is excellent     hemostasis following tumor resection.  It was felt that urethral     catheterization not warranted.  INDICATION:  Mr. Suazo is a very pleasant 81 year old gentleman with history of muscle invasive bladder cancer.  He has elected a palliative approach to this with p.r.n. resections for new recurrence symptoms and he has done well with this approach for over a year.  He has noted on office cystoscopy to have an early recurrence of tumor as old resection site and that is otherwise asymptomatic.  Options were discussed including continued surveillance versus a preemptive transurethral resection and he wished to proceed with the latter.  Informed consent was obtained and placed in the medical record.  PROCEDURE IN DETAIL:  The patient being Aleister Lady, was verified. Procedure being transurethral resection of bladder tumor was confirmed. Procedure was carried out.  Time-out was performed.  Intravenous antibiotics were administered.  General LMA anesthesia was introduced. The patient was placed into a low lithotomy position and sterile field was created by prepping and draping  the patient's penis, perineum, and proximal thighs using iodine x3.  Next, cystourethroscopy was performed using a 23-French rigid cystoscope with 30-degree offset lens. Inspection of the anterior and posterior urethra was unremarkable. Inspection of the bladder revealed mild trabeculation.  There was an early recurrence of bladder tumor at the left superior old resection site approximately 3 centimeter squared that was papillary in type, this did not encroach anywhere near the trigone.  Attention was directed at retrograde pyelography.  The left ureter was cannulated with 6-French end-hole catheter and left retrograde pyelogram was obtained.  Left retrograde pyelography demonstrated a single left ureter with single system left kidney.  No filling defects or narrowing noted. Similarly, right retrograde pyelogram was obtained.  Right retrograde pyelogram demonstrated a single right ureter with single-system right kidney.  No filling defects or narrowing noted.  The cystoscope was exchanged for the 26-French continuous flow resectoscope sheath using normal saline irrigation and a medium-sized bipolar resectoscope loop.  Very careful fulguration was performed of the vast majority of the recurrent tumor including approximately 2 cm around circumferentially of normal-appearing bladder.  The most impressive area of recurrence of papillary tissue was a separately biopsied with cold cup biopsy forceps and set aside for permanent pathology and the base of this was fulgurated.  We thus  ablated or resected all visible tumor, which again was approximately 3 centimeter squared.  Following these maneuvers, there was excellent hemostasis, no evidence of bladder perforation.  Bladder was emptied per cystoscope.  Procedure was then terminated.  The patient tolerated the procedure well.  There were no immediate periprocedural complications.  The patient was taken to the postanesthesia care unit in stable  condition.          ______________________________ Alexis Frock, MD     TM/MEDQ  D:  01/20/2015  T:  01/21/2015  Job:  [id number]

## 2015-01-25 ENCOUNTER — Encounter: Payer: Self-pay | Admitting: Cardiovascular Disease

## 2015-01-25 ENCOUNTER — Ambulatory Visit (INDEPENDENT_AMBULATORY_CARE_PROVIDER_SITE_OTHER): Payer: Medicare Other | Admitting: Cardiovascular Disease

## 2015-01-25 VITALS — BP 138/70 | HR 75 | Ht 72.0 in | Wt 182.0 lb

## 2015-01-25 DIAGNOSIS — C671 Malignant neoplasm of dome of bladder: Secondary | ICD-10-CM

## 2015-01-25 DIAGNOSIS — I2510 Atherosclerotic heart disease of native coronary artery without angina pectoris: Secondary | ICD-10-CM

## 2015-01-25 DIAGNOSIS — I482 Chronic atrial fibrillation, unspecified: Secondary | ICD-10-CM

## 2015-01-25 DIAGNOSIS — I10 Essential (primary) hypertension: Secondary | ICD-10-CM | POA: Diagnosis not present

## 2015-01-25 DIAGNOSIS — Z955 Presence of coronary angioplasty implant and graft: Secondary | ICD-10-CM | POA: Diagnosis not present

## 2015-01-25 DIAGNOSIS — R5383 Other fatigue: Secondary | ICD-10-CM

## 2015-01-25 MED ORDER — ASPIRIN EC 325 MG PO TBEC: 325.0000 mg | DELAYED_RELEASE_TABLET | Freq: Every day | ORAL | Status: DC

## 2015-01-25 NOTE — Progress Notes (Signed)
Patient ID: GLOYD HAPP, male   DOB: 17-Dec-1926, 81 y.o.   MRN: [id number]      SUBJECTIVE: Manuel Le underwent bare metal stent placement to the ostium of the RCA on 07/08/14.  He underwent transurethral resection for bladder tumor recurrence on 6/1.  He denies chest pain, shortness of breath, bleeding problems, and dizziness.   His primary complaint for the last 3 years has been generalized weakness. He said he was tested for a sleeping disturbance and does not have any. His hemoglobin was 12.9 on 5/27. He has not seen his PCP recently.   Review of Systems: As per "subjective", otherwise negative.  No Known Allergies  Current Outpatient Prescriptions  Medication Sig Dispense Refill  . acetaminophen (TYLENOL) 500 MG tablet Take 1,000 mg by mouth every 6 (six) hours as needed for mild pain. Pain    . amLODipine (NORVASC) 5 MG tablet Take 5 mg by mouth every morning.     Marland Kitchen aspirin EC 81 MG EC tablet Take 1 tablet (81 mg total) by mouth daily.    Marland Kitchen atorvastatin (LIPITOR) 20 MG tablet Take 1 tablet (20 mg total) by mouth daily at 6 PM. 30 tablet 11  . carvedilol (COREG) 3.125 MG tablet Take 1 tablet (3.125 mg total) by mouth every evening.    . cetirizine (ZYRTEC) 10 MG tablet Take 10 mg by mouth every morning.     . Cholecalciferol (VITAMIN D-3) 1000 UNITS CAPS Take 1 capsule by mouth daily.    . clopidogrel (PLAVIX) 75 MG tablet Take 1 tablet (75 mg total) by mouth daily. 30 tablet 11  . ferrous sulfate 325 (65 FE) MG EC tablet Take 325 mg by mouth daily with breakfast.    . losartan-hydrochlorothiazide (HYZAAR) 100-25 MG per tablet Take 1 tablet by mouth daily.    . Misc Natural Products (OSTEO BI-FLEX ADV JOINT SHIELD) TABS Take 1 tablet by mouth 2 (two) times daily.     . Multiple Vitamins-Minerals (CENTRUM SILVER ADULT 50+) TABS Take 1 tablet by mouth every morning.    . nitroGLYCERIN (NITROSTAT) 0.4 MG SL tablet Place 1 tablet (0.4 mg total) under the tongue every 5 (five)  minutes as needed for chest pain. 25 tablet 3  . vitamin B-12 (CYANOCOBALAMIN) 1000 MCG tablet Take 1,000 mcg by mouth daily.     No current facility-administered medications for this visit.    Past Medical History  Diagnosis Date  . Hypertension   . Coronary artery disease   . Iron deficiency anemia   . Bladder cancer   . Hematuria   . Atrial fibrillation and flutter   . Mild obstructive sleep apnea     no cpap  . Exertional angina 07/08/2014  . Arthritis     OSTEO  . S/P coronary artery stent placement 07/24/2014  . Dysrhythmia   . Kidney stones     hx of   . H/O urinary frequency   . Weakness     Past Surgical History  Procedure Laterality Date  . Colonoscopy  07/04/2012    Procedure: COLONOSCOPY;  Surgeon: Rogene Houston, MD;  [facility];  Service: Endoscopy;  Laterality: N/A;  320  . Esophagogastroduodenoscopy  07/04/2012    Procedure: ESOPHAGOGASTRODUODENOSCOPY (EGD);  Surgeon: Rogene Houston, MD;  Location: [facility];  Service: Endoscopy;  Laterality: N/A;  . Cardiovascular stress test  12-15-2013  DR Kate Sable ([place])    MILD - MODERATE PERI-INFARCT ISCHEMIA  OF INFERIOR  WALL, MID-INFEROSEPTAL WALL, MID-INFEROLATERAL WALL, & BASAL INFERIOR WALL/   NORMAL LVF /  EF 59%/  INTERMEDIATE RISK STUDY  . Transthoracic echocardiogram  09-11-2013    MODERATE LVH/  EF 55-60%/  MILD MR & TR/  MILD TO MODERATE CALCIFIED AV WITHOUT STENOSIS/  MILD LAE/   MODERATE PR  . Total knee arthroplasty Bilateral 2000  &  2003?  Marland Kitchen Hernia repair Right   . Transurethral resection of bladder tumor with gyrus (turbt-gyrus) N/A 12/31/2013    Procedure: TRANSURETHRAL RESECTION OF BLADDER TUMOR WITH GYRUS (TURBT-GYRUS);  Surgeon: Alexis Frock, MD;  Location: [facility];  Service: Urology;  Laterality: N/A;  . Cystoscopy w/ retrogrades Bilateral 12/31/2013    Procedure: CYSTOSCOPY WITH RETROGRADE PYELOGRAM;  Surgeon: Alexis Frock, MD;  Location: [facility];  Service:  Urology;  Laterality: Bilateral;  . Coronary stent placement  07/08/2014    PTCA of RCA   DR COOPER  . Left heart catheterization with coronary angiogram N/A 07/08/2014    Procedure: LEFT HEART CATHETERIZATION WITH CORONARY ANGIOGRAM;  Surgeon: Blane Ohara, MD;  Location: [facility] CATH LAB;  Service: Cardiovascular;  Laterality: N/A;  . Percutaneous coronary stent intervention (pci-s)  07/08/2014    Procedure: PERCUTANEOUS CORONARY STENT INTERVENTION (PCI-S);  Surgeon: Blane Ohara, MD;  Location: [facility] CATH LAB;  Service: Cardiovascular;;  . Eye surgery      past cataract surgery bilat  . Transurethral resection of bladder tumor N/A 01/20/2015    Procedure: TRANSURETHRAL RESECTION OF BLADDER TUMOR (TURBT);  Surgeon: Alexis Frock, MD;  Location: [facility];  Service: Urology;  Laterality: N/A;  . Cystoscopy w/ retrogrades Bilateral 01/20/2015    Procedure: CYSTOSCOPY WITH RETROGRADE PYELOGRAM;  Surgeon: Alexis Frock, MD;  Location: [facility];  Service: Urology;  Laterality: Bilateral;    History   Social History  . Marital Status: Married    Spouse Name: N/A  . Number of Children: N/A  . Years of Education: N/A   Occupational History  . Not on file.   Social History Main Topics  . Smoking status: Former Smoker -- 2.00 packs/day for 20 years    Types: Cigarettes, Cigars, Pipe    Quit date: 08/21/1968  . Smokeless tobacco: Never Used     Comment: quit smoking in 1980  . Alcohol Use: No  . Drug Use: No  . Sexual Activity: Not on file   Other Topics Concern  . Not on file   Social History Narrative     Filed Vitals:   01/25/15 0823  BP: 138/70  Pulse: 75  Height: 6' (1.829 m)  Weight: 182 lb (82.555 kg)  SpO2: 98%    PHYSICAL EXAM General: NAD  Neck: No JVD, no thyromegaly or thyroid nodule.  Lungs: Clear to auscultation bilaterally with normal respiratory effort.  CV: Nondisplaced PMI. Irregular rhythm, normal S1/S2, no S3, II/VI ejection systolic murmur at RUSB and  II/VI apical holosystolic murmur. No peripheral edema.  Abdomen: Soft, nontender, no distention.  Skin: Intact without lesions or rashes.  Neurologic: Alert and oriented x 3.  Psych: Normal affect.  Extremities: No clubbing or cyanosis.  HEENT: Normal.    ECG: Most recent ECG reviewed.      ASSESSMENT AND PLAN: 1. CAD s/p BMS to RCA: Will d/c Plavix and increase ASA to 325 mg (given atrial fibrillation and inability to use anticoagulants) indefinitely. Continue Lipitor 20 mg.  I do not think his complaints of generalized weakness are cardiac in etiology.  2. Atrial fibrillation/flutter: Warfarin  has been held given his bladder cancer. His CHADS-Vasc score is 4, thus warranting anticoagulation as he is at high risk for a CVA. In addition to this, malignancy is associated with a hypercoagulable state. However, given the aforementioned diagnosis of bladder cancer with prior hematuria, I will hold off on reinstituting warfarin and will increase ASA to 325 mg mg daily. Will d/c Plavix.  3. Essential hypertension: Controlled. No changes.  4. Bladder cancer: s/p resection on 6/1.   Dispo: f/u 6 months.   Kate Sable, M.D., F.A.C.C.

## 2015-01-25 NOTE — Patient Instructions (Signed)
   Increase Aspirin to 325mg  daily  Stop Plavix Continue all other medications.   Your physician wants you to follow up in: 6 months.  You will receive a reminder letter in the mail one-two months in advance.  If you don't receive a letter, please call our office to schedule the follow up appointment

## 2015-02-11 DIAGNOSIS — C670 Malignant neoplasm of trigone of bladder: Secondary | ICD-10-CM | POA: Diagnosis not present

## 2015-02-11 DIAGNOSIS — N200 Calculus of kidney: Secondary | ICD-10-CM | POA: Diagnosis not present

## 2015-03-02 DIAGNOSIS — M353 Polymyalgia rheumatica: Secondary | ICD-10-CM | POA: Diagnosis not present

## 2015-03-02 DIAGNOSIS — R5383 Other fatigue: Secondary | ICD-10-CM | POA: Diagnosis not present

## 2015-03-02 DIAGNOSIS — Z79899 Other long term (current) drug therapy: Secondary | ICD-10-CM | POA: Diagnosis not present

## 2015-04-02 DIAGNOSIS — D509 Iron deficiency anemia, unspecified: Secondary | ICD-10-CM | POA: Diagnosis not present

## 2015-04-02 DIAGNOSIS — I499 Cardiac arrhythmia, unspecified: Secondary | ICD-10-CM | POA: Diagnosis not present

## 2015-04-02 DIAGNOSIS — M353 Polymyalgia rheumatica: Secondary | ICD-10-CM | POA: Diagnosis not present

## 2015-04-02 DIAGNOSIS — D649 Anemia, unspecified: Secondary | ICD-10-CM | POA: Diagnosis not present

## 2015-06-08 DIAGNOSIS — M353 Polymyalgia rheumatica: Secondary | ICD-10-CM | POA: Diagnosis not present

## 2015-06-08 DIAGNOSIS — Z23 Encounter for immunization: Secondary | ICD-10-CM | POA: Diagnosis not present

## 2015-06-08 DIAGNOSIS — R1310 Dysphagia, unspecified: Secondary | ICD-10-CM | POA: Diagnosis not present

## 2015-06-08 DIAGNOSIS — L570 Actinic keratosis: Secondary | ICD-10-CM | POA: Diagnosis not present

## 2015-06-08 DIAGNOSIS — R5383 Other fatigue: Secondary | ICD-10-CM | POA: Diagnosis not present

## 2015-06-08 DIAGNOSIS — C679 Malignant neoplasm of bladder, unspecified: Secondary | ICD-10-CM | POA: Diagnosis not present

## 2015-06-17 DIAGNOSIS — C670 Malignant neoplasm of trigone of bladder: Secondary | ICD-10-CM | POA: Diagnosis not present

## 2015-06-17 DIAGNOSIS — R310 Gross hematuria: Secondary | ICD-10-CM | POA: Diagnosis not present

## 2015-06-18 ENCOUNTER — Other Ambulatory Visit: Payer: Self-pay | Admitting: Physician Assistant

## 2015-07-07 ENCOUNTER — Encounter (INDEPENDENT_AMBULATORY_CARE_PROVIDER_SITE_OTHER): Payer: Self-pay | Admitting: *Deleted

## 2015-07-14 ENCOUNTER — Encounter (INDEPENDENT_AMBULATORY_CARE_PROVIDER_SITE_OTHER): Payer: Self-pay | Admitting: Internal Medicine

## 2015-07-14 ENCOUNTER — Encounter (INDEPENDENT_AMBULATORY_CARE_PROVIDER_SITE_OTHER): Payer: Self-pay | Admitting: *Deleted

## 2015-07-14 ENCOUNTER — Ambulatory Visit (INDEPENDENT_AMBULATORY_CARE_PROVIDER_SITE_OTHER): Payer: Medicare Other | Admitting: Internal Medicine

## 2015-07-14 VITALS — BP 132/52 | HR 56 | Temp 97.5°F | Ht 72.0 in | Wt 192.4 lb

## 2015-07-14 DIAGNOSIS — R1319 Other dysphagia: Secondary | ICD-10-CM

## 2015-07-14 DIAGNOSIS — I2510 Atherosclerotic heart disease of native coronary artery without angina pectoris: Secondary | ICD-10-CM

## 2015-07-14 DIAGNOSIS — C679 Malignant neoplasm of bladder, unspecified: Secondary | ICD-10-CM | POA: Insufficient documentation

## 2015-07-14 NOTE — Progress Notes (Addendum)
Subjective:    Patient ID: Manuel Le, male    DOB: 10/11/1926, 81 y.o.   MRN: [id number]  HPI Referred by Dr. Wende Neighbors dysphagia. States he feels like he has a golf ball in his upper esophagus. Sometimes when he eats, and if he bends over, foods comes back up into his mouth. Symptoms x 4-6 months.  Sometimes at night fluid comes up into his mouth when he lies down.  He denies any dysphagia.  His appetite is good. No weight loss. No acid reflux.  He has a BM daily. No melena or BRRB.  Hx significant for heart disease and has a stent. Maintained on ASA 325mg . Hx of anemia.       07/04/2012 Colonoscopy followed by EGD.  Indications: Patient is 81 year old Caucasian male who presents with a deficiency anemia. He has no GI symptoms. He's never been screened for CRC. He is on low-dose aspirin.  Impression:  Tortuous and redundant colon with few diverticula at sigmoid colon and external hemorrhoids. Nonerosive gastritis otherwise normal EGD. No lesion identified in upper or lower GI tract to account for patient's IDA.   Review of Systems   Past Medical History  Diagnosis Date  . Hypertension   . Coronary artery disease   . Iron deficiency anemia   . Bladder cancer ([place])   . Hematuria   . Atrial fibrillation and flutter ([place])   . Mild obstructive sleep apnea     no cpap  . Exertional angina ([place]) 07/08/2014  . Arthritis     OSTEO  . S/P coronary artery stent placement 07/24/2014  . Dysrhythmia   . Kidney stones     hx of   . H/O urinary frequency   . Weakness     Past Surgical History  Procedure Laterality Date  . Colonoscopy  07/04/2012    Procedure: COLONOSCOPY;  Surgeon: Rogene Houston, MD;  [facility];  Service: Endoscopy;  Laterality: N/A;  320  . Esophagogastroduodenoscopy  07/04/2012    Procedure: ESOPHAGOGASTRODUODENOSCOPY  (EGD);  Surgeon: Rogene Houston, MD;  [facility];  Service: Endoscopy;  Laterality: N/A;  . Cardiovascular stress test  12-15-2013  DR Kate Sable ([place])    MILD - MODERATE PERI-INFARCT ISCHEMIA  OF INFERIOR WALL, MID-INFEROSEPTAL WALL, MID-INFEROLATERAL WALL, & BASAL INFERIOR WALL/   NORMAL LVF /  EF 59%/  INTERMEDIATE RISK STUDY  . Transthoracic echocardiogram  09-11-2013    MODERATE LVH/  EF 55-60%/  MILD MR & TR/  MILD TO MODERATE CALCIFIED AV WITHOUT STENOSIS/  MILD LAE/   MODERATE PR  . Total knee arthroplasty Bilateral 2000  &  2003?  Marland Kitchen Hernia repair Right   . Transurethral resection of bladder tumor with gyrus (turbt-gyrus) N/A 12/31/2013    Procedure: TRANSURETHRAL RESECTION OF BLADDER TUMOR WITH GYRUS (TURBT-GYRUS);  Surgeon: Alexis Frock, MD;  Location: [facility];  Service: Urology;  Laterality: N/A;  . Cystoscopy w/ retrogrades Bilateral 12/31/2013    Procedure: CYSTOSCOPY WITH RETROGRADE PYELOGRAM;  Surgeon: Alexis Frock, MD;  Location: [facility];  Service: Urology;  Laterality: Bilateral;  . Coronary stent placement  07/08/2014    PTCA of RCA   DR COOPER  . Left heart catheterization with coronary angiogram N/A 07/08/2014    Procedure: LEFT HEART CATHETERIZATION WITH CORONARY ANGIOGRAM;  Surgeon: Blane Ohara, MD;  Location: [facility] CATH LAB;  Service: Cardiovascular;  Laterality: N/A;  . Percutaneous coronary stent intervention (pci-s)  07/08/2014  Procedure: PERCUTANEOUS CORONARY STENT INTERVENTION (PCI-S);  Surgeon: Blane Ohara, MD;  Location: [facility] CATH LAB;  Service: Cardiovascular;;  . Eye surgery      past cataract surgery bilat  . Transurethral resection of bladder tumor N/A 01/20/2015    Procedure: TRANSURETHRAL RESECTION OF BLADDER TUMOR (TURBT);  Surgeon: Alexis Frock, MD;  Location: [facility];  Service: Urology;  Laterality: N/A;  . Cystoscopy w/ retrogrades Bilateral 01/20/2015    Procedure: CYSTOSCOPY WITH RETROGRADE PYELOGRAM;  Surgeon:  Alexis Frock, MD;  Location: [facility];  Service: Urology;  Laterality: Bilateral;    No Known Allergies  Current Outpatient Prescriptions on File Prior to Visit  Medication Sig Dispense Refill  . acetaminophen (TYLENOL) 500 MG tablet Take 1,000 mg by mouth every 6 (six) hours as needed for mild pain. Pain    . amLODipine (NORVASC) 5 MG tablet Take 5 mg by mouth every morning.     Marland Kitchen aspirin EC 325 MG tablet Take 1 tablet (325 mg total) by mouth daily.    Marland Kitchen atorvastatin (LIPITOR) 20 MG tablet TAKE ONE TABLET BY MOUTH DAILY AT 6 P.M. 30 tablet 6  . carvedilol (COREG) 3.125 MG tablet Take 1 tablet (3.125 mg total) by mouth every evening.    . cetirizine (ZYRTEC) 10 MG tablet Take 10 mg by mouth every morning.     . Cholecalciferol (VITAMIN D-3) 1000 UNITS CAPS Take 1 capsule by mouth daily.    . ferrous sulfate 325 (65 FE) MG EC tablet Take 325 mg by mouth daily with breakfast.    . losartan-hydrochlorothiazide (HYZAAR) 100-25 MG per tablet Take 1 tablet by mouth daily.    . Misc Natural Products (OSTEO BI-FLEX ADV JOINT SHIELD) TABS Take 1 tablet by mouth 2 (two) times daily.     . Multiple Vitamins-Minerals (CENTRUM SILVER ADULT 50+) TABS Take 1 tablet by mouth every morning.    . nitroGLYCERIN (NITROSTAT) 0.4 MG SL tablet Place 1 tablet (0.4 mg total) under the tongue every 5 (five) minutes as needed for chest pain. 25 tablet 3  . vitamin B-12 (CYANOCOBALAMIN) 1000 MCG tablet Take 1,000 mcg by mouth daily.     No current facility-administered medications on file prior to visit.           Objective:   Physical Exam Blood pressure 132/52, pulse 56, temperature 97.5 F (36.4 C), height 6' (1.829 m), weight 192 lb 6.4 oz (87.272 kg). Alert and oriented. Skin warm and dry. Oral mucosa is moist.   . Sclera anicteric, conjunctivae is pink. Thyroid not enlarged. No cervical lymphadenopathy. Lungs clear. Heart regular rate and rhythm.  Abdomen is soft. Bowel sounds are positive. No  hepatomegaly. No abdominal masses felt. No tenderness.  No edema to lower extremities.      Assessment & Plan:  Solid food dysphagia. DG Esophagram. Stricture needs to be ruled out/dysmotility

## 2015-07-14 NOTE — Patient Instructions (Signed)
DG Esophagram.  

## 2015-07-19 ENCOUNTER — Ambulatory Visit (HOSPITAL_COMMUNITY)
Admission: RE | Admit: 2015-07-19 | Discharge: 2015-07-19 | Disposition: A | Discharge status: Home / Self Care | Payer: Medicare Other | Source: Ambulatory Visit | Attending: Internal Medicine | Admitting: Internal Medicine

## 2015-07-19 DIAGNOSIS — K225 Diverticulum of esophagus, acquired: Secondary | ICD-10-CM | POA: Diagnosis not present

## 2015-07-19 DIAGNOSIS — R1319 Other dysphagia: Secondary | ICD-10-CM | POA: Diagnosis not present

## 2015-07-19 DIAGNOSIS — R1310 Dysphagia, unspecified: Secondary | ICD-10-CM | POA: Diagnosis not present

## 2015-07-19 DIAGNOSIS — K224 Dyskinesia of esophagus: Secondary | ICD-10-CM | POA: Diagnosis not present

## 2015-07-20 DIAGNOSIS — C679 Malignant neoplasm of bladder, unspecified: Secondary | ICD-10-CM | POA: Diagnosis not present

## 2015-07-20 DIAGNOSIS — R5383 Other fatigue: Secondary | ICD-10-CM | POA: Diagnosis not present

## 2015-07-20 DIAGNOSIS — M3503 Sicca syndrome with myopathy: Secondary | ICD-10-CM | POA: Diagnosis not present

## 2015-07-20 DIAGNOSIS — R1310 Dysphagia, unspecified: Secondary | ICD-10-CM | POA: Diagnosis not present

## 2015-07-20 DIAGNOSIS — D649 Anemia, unspecified: Secondary | ICD-10-CM | POA: Diagnosis not present

## 2015-07-20 DIAGNOSIS — R5382 Chronic fatigue, unspecified: Secondary | ICD-10-CM | POA: Diagnosis not present

## 2015-07-20 DIAGNOSIS — M353 Polymyalgia rheumatica: Secondary | ICD-10-CM | POA: Diagnosis not present

## 2015-07-22 ENCOUNTER — Ambulatory Visit: Payer: PRIVATE HEALTH INSURANCE | Admitting: Cardiovascular Disease

## 2015-07-23 ENCOUNTER — Ambulatory Visit (INDEPENDENT_AMBULATORY_CARE_PROVIDER_SITE_OTHER): Payer: Medicare Other | Admitting: Cardiovascular Disease

## 2015-07-23 ENCOUNTER — Encounter: Payer: Self-pay | Admitting: Cardiovascular Disease

## 2015-07-23 VITALS — BP 154/78 | HR 73 | Ht 72.0 in | Wt 197.2 lb

## 2015-07-23 DIAGNOSIS — I2510 Atherosclerotic heart disease of native coronary artery without angina pectoris: Secondary | ICD-10-CM | POA: Diagnosis not present

## 2015-07-23 DIAGNOSIS — I4891 Unspecified atrial fibrillation: Secondary | ICD-10-CM

## 2015-07-23 DIAGNOSIS — Z955 Presence of coronary angioplasty implant and graft: Secondary | ICD-10-CM

## 2015-07-23 DIAGNOSIS — I10 Essential (primary) hypertension: Secondary | ICD-10-CM

## 2015-07-23 DIAGNOSIS — R5383 Other fatigue: Secondary | ICD-10-CM

## 2015-07-23 DIAGNOSIS — Z5181 Encounter for therapeutic drug level monitoring: Secondary | ICD-10-CM

## 2015-07-23 NOTE — Patient Instructions (Signed)
Continue all current medications. Your physician wants you to follow up in:  1 year.  You will receive a reminder letter in the mail one-two months in advance.  If you don't receive a letter, please call our office to schedule the follow up appointment   

## 2015-07-23 NOTE — Progress Notes (Signed)
Patient ID: Manuel Le, male   DOB: 1927/07/14, 81 y.o.   MRN: [id number]      SUBJECTIVE: Manuel Le presents for follow up of CAD. He underwent bare metal stent placement to the ostium of the RCA on 07/08/14.  He underwent transurethral resection for bladder tumor recurrence on 01/20/15.  He was recently found to have a moderate to large Zenker diverticulum. He has been having solid food dysphagia and sees GI.  He denies exertional chest pain, shortness of breath, bleeding problems, and dizziness. He continues to experience fatigue and says his PCP is trying to evaluate the etiology of this.  ECG performed in the office today demonstrates sinus rhythm with PACs.   Review of Systems: As per "subjective", otherwise negative.  No Known Allergies  Current Outpatient Prescriptions  Medication Sig Dispense Refill  . acetaminophen (TYLENOL) 500 MG tablet Take 1,000 mg by mouth every 6 (six) hours as needed for mild pain. Pain    . amLODipine (NORVASC) 5 MG tablet Take 5 mg by mouth every morning.     Marland Kitchen aspirin EC 325 MG tablet Take 1 tablet (325 mg total) by mouth daily.    Marland Kitchen atorvastatin (LIPITOR) 20 MG tablet TAKE ONE TABLET BY MOUTH DAILY AT 6 P.M. 30 tablet 6  . carvedilol (COREG) 3.125 MG tablet Take 1 tablet (3.125 mg total) by mouth every evening.    . cetirizine (ZYRTEC) 10 MG tablet Take 10 mg by mouth every morning.     . Cholecalciferol (VITAMIN D-3) 1000 UNITS CAPS Take 1 capsule by mouth daily.    . ferrous sulfate 325 (65 FE) MG EC tablet Take 325 mg by mouth daily with breakfast.    . losartan-hydrochlorothiazide (HYZAAR) 100-25 MG per tablet Take 1 tablet by mouth daily.    . Misc Natural Products (OSTEO BI-FLEX ADV JOINT SHIELD) TABS Take 1 tablet by mouth 2 (two) times daily.     . Multiple Vitamins-Minerals (CENTRUM SILVER ADULT 50+) TABS Take 1 tablet by mouth every morning.    . nitroGLYCERIN (NITROSTAT) 0.4 MG SL tablet Place 1 tablet (0.4 mg total) under the tongue  every 5 (five) minutes as needed for chest pain. 25 tablet 3  . vitamin B-12 (CYANOCOBALAMIN) 1000 MCG tablet Take 1,000 mcg by mouth daily.     No current facility-administered medications for this visit.    Past Medical History  Diagnosis Date  . Hypertension   . Coronary artery disease   . Iron deficiency anemia   . Bladder cancer ([place])   . Hematuria   . Atrial fibrillation and flutter ([place])   . Mild obstructive sleep apnea     no cpap  . Exertional angina ([place]) 07/08/2014  . Arthritis     OSTEO  . S/P coronary artery stent placement 07/24/2014  . Dysrhythmia   . Kidney stones     hx of   . H/O urinary frequency   . Weakness     Past Surgical History  Procedure Laterality Date  . Colonoscopy  07/04/2012    Procedure: COLONOSCOPY;  Surgeon: Rogene [place], MD;  Location: [facility];  Service: Endoscopy;  Laterality: N/A;  320  . Esophagogastroduodenoscopy  07/04/2012    Procedure: ESOPHAGOGASTRODUODENOSCOPY (EGD);  Surgeon: Rogene [place], MD;  Location: [facility];  Service: Endoscopy;  Laterality: N/A;  . Cardiovascular stress test  12-15-2013  DR Kate Sable ([place])    MILD - MODERATE PERI-INFARCT ISCHEMIA  OF INFERIOR WALL,  MID-INFEROSEPTAL WALL, MID-INFEROLATERAL WALL, & BASAL INFERIOR WALL/   NORMAL LVF /  EF 59%/  INTERMEDIATE RISK STUDY  . Transthoracic echocardiogram  09-11-2013    MODERATE LVH/  EF 55-60%/  MILD MR & TR/  MILD TO MODERATE CALCIFIED AV WITHOUT STENOSIS/  MILD LAE/   MODERATE PR  . Total knee arthroplasty Bilateral 2000  &  2003?  Marland Kitchen Hernia repair Right   . Transurethral resection of bladder tumor with gyrus (turbt-gyrus) N/A 12/31/2013    Procedure: TRANSURETHRAL RESECTION OF BLADDER TUMOR WITH GYRUS (TURBT-GYRUS);  Surgeon: Alexis Frock, MD;  Location: [facility];  Service: Urology;  Laterality: N/A;  . Cystoscopy w/ retrogrades Bilateral 12/31/2013    Procedure: CYSTOSCOPY WITH RETROGRADE PYELOGRAM;  Surgeon: Alexis Frock,  MD;  Location: [facility];  Service: Urology;  Laterality: Bilateral;  . Coronary stent placement  07/08/2014    PTCA of RCA   DR COOPER  . Left heart catheterization with coronary angiogram N/A 07/08/2014    Procedure: LEFT HEART CATHETERIZATION WITH CORONARY ANGIOGRAM;  Surgeon: Blane Ohara, MD;  Location: [facility] CATH LAB;  Service: Cardiovascular;  Laterality: N/A;  . Percutaneous coronary stent intervention (pci-s)  07/08/2014    Procedure: PERCUTANEOUS CORONARY STENT INTERVENTION (PCI-S);  Surgeon: Blane Ohara, MD;  Location: [facility] CATH LAB;  Service: Cardiovascular;;  . Eye surgery      past cataract surgery bilat  . Transurethral resection of bladder tumor N/A 01/20/2015    Procedure: TRANSURETHRAL RESECTION OF BLADDER TUMOR (TURBT);  Surgeon: Alexis Frock, MD;  Location: [facility];  Service: Urology;  Laterality: N/A;  . Cystoscopy w/ retrogrades Bilateral 01/20/2015    Procedure: CYSTOSCOPY WITH RETROGRADE PYELOGRAM;  Surgeon: Alexis Frock, MD;  Location: [facility];  Service: Urology;  Laterality: Bilateral;    Social History   Social History  . Marital Status: Married    Spouse Name: N/A  . Number of Children: N/A  . Years of Education: N/A   Occupational History  . Not on file.   Social History Main Topics  . Smoking status: Former Smoker -- 2.00 packs/day for 20 years    Types: Cigarettes, Cigars, Pipe    Quit date: 08/21/1968  . Smokeless tobacco: Never Used     Comment: quit smoking in 1980  . Alcohol Use: No  . Drug Use: No  . Sexual Activity: Not on file   Other Topics Concern  . Not on file   Social History Narrative     Filed Vitals:   07/23/15 0843  BP: 154/78  Pulse: 73  Height: 6' (1.829 m)  Weight: 197 lb 3.2 oz (89.449 kg)  SpO2: 98%    PHYSICAL EXAM General: NAD  Neck: No JVD, no thyromegaly or thyroid nodule.  Lungs: Clear to auscultation bilaterally with normal respiratory effort.  CV: Nondisplaced PMI. Regular rate and mostly regular  rhythm, normal S1/S2, no S3/S4, II/VI ejection systolic murmur at RUSB and II/VI apical holosystolic murmur. No peripheral edema.  Abdomen: Soft, nontender, no distention.  Skin: Intact without lesions or rashes.  Neurologic: Alert and oriented x 3.  Psych: Normal affect.  Extremities: No clubbing or cyanosis.  HEENT: Normal.   ECG: Most recent ECG reviewed.      ASSESSMENT AND PLAN: 1. CAD s/p BMS to RCA: Symptomatically stable. Continue ASA 325 mg (given atrial fibrillation and inability to use anticoagulants) indefinitely. Continue Coreg 3.125 mg q pm and Lipitor 20 mg.   2. Atrial fibrillation/flutter: In sinus rhythm today with PAC's.  Symptomatically stable from this standpoint. Warfarin was discontinued given his bladder cancer. His CHADS-Vasc score is 4, thus warranting anticoagulation as he is at high risk for a CVA. In addition to this, malignancy is associated with a hypercoagulable state. However, given the aforementioned diagnosis of bladder cancer with prior hematuria, I will hold off on reinstituting warfarin and will continue ASA 325 mg daily.   3. Essential hypertension: Mildly elevated today but controlled at last visit. Will monitor. No changes.  4. Bladder cancer: s/p resection on 01/20/15.   Dispo: f/u 1 year.  Kate Sable, M.D., F.A.C.C.

## 2015-07-26 DIAGNOSIS — D225 Melanocytic nevi of trunk: Secondary | ICD-10-CM | POA: Diagnosis not present

## 2015-07-26 DIAGNOSIS — D0462 Carcinoma in situ of skin of left upper limb, including shoulder: Secondary | ICD-10-CM | POA: Diagnosis not present

## 2015-07-26 DIAGNOSIS — L570 Actinic keratosis: Secondary | ICD-10-CM | POA: Diagnosis not present

## 2015-07-26 DIAGNOSIS — X32XXXA Exposure to sunlight, initial encounter: Secondary | ICD-10-CM | POA: Diagnosis not present

## 2015-07-28 ENCOUNTER — Encounter (INDEPENDENT_AMBULATORY_CARE_PROVIDER_SITE_OTHER): Payer: Self-pay | Admitting: *Deleted

## 2015-07-28 ENCOUNTER — Other Ambulatory Visit (INDEPENDENT_AMBULATORY_CARE_PROVIDER_SITE_OTHER): Payer: Self-pay | Admitting: Internal Medicine

## 2015-07-28 DIAGNOSIS — K225 Diverticulum of esophagus, acquired: Secondary | ICD-10-CM

## 2015-07-28 IMAGING — NM NM MYOCAR SINGLE W/SPECT W/WALL MOTION & EF
1 series · 6 of 6 positions shown · non-contrast
Comparison: None.

CLINICAL DATA: The patient is is an 86-year-old male with atrial
fibrillation who has been short of breath and fatigued. He is
referred for an ischemic evaluation.

EXAM:
MYOCARDIAL IMAGING WITH SPECT (REST AND PHARMACOLOGIC-STRESS)
GATED LEFT VENTRICULAR WALL MOTION STUDY
LEFT VENTRICULAR EJECTION FRACTION
TECHNIQUE: Standard myocardial SPECT imaging was performed after resting
intravenous injection of 10 mCi Gc-77m sestamibi. Subsequently,
intravenous infusion of Lexiscan was performed under the supervision
of the Cardiology staff. At peak effect of the drug, 30 mCi Gc-77m
sestamibi was injected intravenously and standard myocardial SPECT
imaging was performed. Quantitative gated imaging was also performed
to evaluate left ventricular wall motion, and estimate left
ventricular ejection fraction.

[Series 1: cs cardiac tc hi dose · 6.41mm/px · 6 of 512 frames shown]
[frame 43/512]
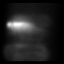
[frame 128/512]
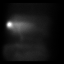
[frame 214/512]
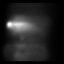
[frame 299/512]
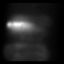
[frame 384/512]
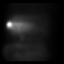
[frame 470/512]
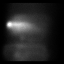

[6 of 6 positions shown; findings below may reference images not displayed]

FINDINGS: The patient was stressed according to the Lexiscan protocol. The
resting heart rate of 74 beats per min rose to a maximal heart rate
of 91 beats per min. The resting blood pressure 110/75 rose to a
maximum blood pressure of 148/61. No chest pain was reported.

The resting ECG showed normal sinus rhythm. With stress, isolated
PVCs were noted. PVCs were also noted in recovery. There were no
diagnostic ST segment or T-wave abnormalities.

Analysis of the raw data showed no significant extracardiac
radiotracer uptake. Analysis of the perfusion images showed a
moderate in size, moderately severe, fixed apical inferior wall
defect. However, there was normal regional wall motion in this area.
There were additional large, severely intense defects in the mid
inferior wall, mid inferoseptal wall, mid inferolateral wall, and
basal inferior wall. There was a mild to moderate degree of
reversibility seen in these regions. These regions were also noted
to be hypokinetic. Left ventricular systolic function was normal,
calculated LVEF 59%. The summed stress score was 9, the summed rest
score was 0, with a resulting summed difference score of 9. This is
suggestive of myocardial scar with a mild to moderate degree of
peri-infarct ischemia.
IMPRESSION: 1. Abnormal Lexiscan Cardiolite stress test.

2. Mild to moderate degree of peri-infarct ischemia seen in the mid
inferior wall, mid inferoseptal wall, mid inferolateral wall, and
basal inferior wall.

3. Normal left ventricular systolic function, calculated LVEF 59%.

4. This is an intermediate risk study for future major adverse
cardiac events.

## 2015-08-06 DIAGNOSIS — K225 Diverticulum of esophagus, acquired: Secondary | ICD-10-CM | POA: Diagnosis not present

## 2015-08-06 DIAGNOSIS — R1314 Dysphagia, pharyngoesophageal phase: Secondary | ICD-10-CM | POA: Diagnosis not present

## 2015-08-18 ENCOUNTER — Telehealth: Payer: Self-pay | Admitting: *Deleted

## 2015-08-18 NOTE — Telephone Encounter (Signed)
Pt walked into office asking if he needed to hold ASA 325 mg before "throat surgery". Pt sees ENT in [place], Dr. Constance Holster, spoke with his nurse who stated she would call the pt and that they would hold ASA 5 days prior.

## 2015-08-23 ENCOUNTER — Other Ambulatory Visit: Payer: Self-pay | Admitting: Physician Assistant

## 2015-08-30 DIAGNOSIS — L570 Actinic keratosis: Secondary | ICD-10-CM | POA: Diagnosis not present

## 2015-08-30 DIAGNOSIS — X32XXXD Exposure to sunlight, subsequent encounter: Secondary | ICD-10-CM | POA: Diagnosis not present

## 2015-08-30 DIAGNOSIS — D0462 Carcinoma in situ of skin of left upper limb, including shoulder: Secondary | ICD-10-CM | POA: Diagnosis not present

## 2015-08-31 ENCOUNTER — Telehealth: Payer: Self-pay | Admitting: *Deleted

## 2015-08-31 NOTE — Telephone Encounter (Signed)
Noted, will fax back to [facility] today.

## 2015-08-31 NOTE — Telephone Encounter (Signed)
Can proceed with procedure. If need be, can hold ASA 2-3 days beforehand.

## 2015-08-31 NOTE — Telephone Encounter (Signed)
Received fax from [facility] requesting clearance for endoscopic zenker's diverticulectomy for 1 hour with 23 hour observation [facility].  Patient on ASA only.

## 2015-09-07 ENCOUNTER — Ambulatory Visit: Payer: Self-pay | Admitting: Otolaryngology

## 2015-09-07 NOTE — H&P (Signed)
  Assessment  Zenker's diverticulum (530.6) (K22.5). Pharyngoesophageal dysphagia (787.24) (R13.14). Discussed  Large Zenker's diverticulum with symptomatic dysphasia but no pneumonia, or weight loss. We discussed a surgical option which is endoscopic diverticulectomy. Risks and benefits were discussed. All questions were answered. She will call to schedule if and when he is ready. Reason For Visit  Throat. HPI  5 or 6 month history of difficulty swallowing and regurgitation of undigested food after eating. No history of heartburn or reflux. He hasn't smoked in 30+ years. He doesn't drink or consume caffeine. Barium swallow reveals a large Zenker's diverticulum. Allergies  No Known Drug Allergies. Current Meds  Aspirin EC 81 MG Oral Tablet Delayed Release;; RPT Vitamin B-12 TABS;; RPT Zyrtec TABS;; RPT Ferrous Sulfate TABS;; RPT Centrum Silver TABS;; RPT Tylenol TABS;; RPT AmLODIPine Besylate 5 MG Oral Tablet;; RPT Carvedilol 3.125 MG Oral Tablet;; RPT Osteo Advance TABS;; RPT Losartan Potassium-HCTZ 100-12.5 MG Oral Tablet;; RPT Atorvastatin Calcium 20 MG Oral Tablet;; RPT Glycerine LIQD;; RPT Vitamin D-3 CAPS;; RPT. Active Problems  Hearing loss   (389.9) (H91.90). PMH  History of carcinoma of bladder (V10.51) (Z85.51) History of heart attack 2015 (412) (I25.2). [place]  Cataract Surgery Knee Surgery Lithotripsy Oral Surgery Tooth Extraction. Family Hx  No pertinent family history: Mother. Personal Hx  Never smoker No alcohol use No caffeine use Non-smoker (V49.89) (Z78.9). ROS  12 system ROS was obtained and reviewed on the Health Maintenance form dated today.  Positive responses are shown above.  If the symptom is not checked, the patient has denied it. Vital Signs   Recorded by Iran Sizer on 06 Aug 2015 03:08 PM BP:132/52,  BSA Calculated: 2.10 ,  BMI Calculated: 26.09 ,  Weight: 192 lb 6.4 oz, BMI: 26.1 kg/m2,  Height: 6 ft. Physical Exam  APPEARANCE:  Well developed, well nourished, in no acute distress.  Normal affect, in a pleasant mood.  Oriented to time, place and person. COMMUNICATION: Normal voice   HEAD & FACE:  No scars, lesions or masses of head and face.  Sinuses nontender to palpation.  Salivary glands without mass or tenderness.  Facial strength symmetric.  No facial lesion, scars, or mass. EYES: EOMI with normal primary gaze alignment. Visual acuity grossly intact.  PERRLA EXTERNAL EAR & NOSE: No scars, lesions or masses  EAC & TYMPANIC MEMBRANE:  EAC shows no obstructing lesions or debris and tympanic membranes are normal bilaterally with good movement to insufflation. GROSS HEARING: Normal  TMJ:  Nontender  INTRANASAL EXAM: No polyps or purulence.  NASOPHARYNX: Normal, without lesions. LIPS, TEETH & GUMS: No lip lesions, normal dentition and normal gums. ORAL CAVITY/OROPHARYNX:  Oral mucosa moist without lesion or asymmetry of the palate, tongue, tonsil or posterior pharynx. LARYNX (mirror exam):  No lesions of the epiglottis, false cord or TVC's and cords move well to phonation. HYPOPHARYNX (mirror exam): No lesions, or asymmetry, but there is some apparent pooling of secretions. NECK:  Supple without adenopathy or mass. THYROID:  Normal with no masses palpable.  NEUROLOGIC:  No gross CN deficits. No nystagmus noted.   LYMPHATIC:  No enlarged nodes palpable. Signature  Electronically signed by : Izora Gala  M.D.;

## 2015-09-08 ENCOUNTER — Encounter (HOSPITAL_COMMUNITY): Payer: Self-pay | Admitting: *Deleted

## 2015-09-08 NOTE — Progress Notes (Signed)
Pt denies having any acute cardiopulmonary issues. Pt is under the care of Dr. Bronson Ing, cardiology. Pt made aware to stop taking Aspirin ( dose taken this am),  otc vitamins, fish oil and herbal medications such as osteobi-flex.  Do not take any NSAIDs ie: Ibuprofen, Advil, Naproxen or any medication containing Aspirin. Pt chart forwarded to anesthesia to review cardiac history and clearance note in epic. Pt verbalized understanding of all pre-op instructions.

## 2015-09-08 NOTE — Progress Notes (Signed)
Anesthesia Chart Review:  Pt is 82 year old male scheduled for endoscopic Zenker's diverticulectomy on 09/09/2015 with Dr. Constance Holster.   Pt is a same day work up.   Cardiologist is Dr. Bronson Ing.   PMH includes:  CAD (s/p BMS to ostium of RCA 07/08/14), atrial fibrillation and flutter, HTN, exertional angina, iron deficiency anemia, mild OSA (no CPAP), bladder cancer. Former smoker. BMI 27. S/p TURBT 01/20/15 and 12/31/13.   Medications include: amlodipine, ASA, lipitor, carvedilol, iron, losartan-hctz  Pt will need labs DOS.   EKG 07/23/15: sinus rhythm with 1st degree AV block with PACs.   Cardiac cath 07/08/14:  - LM is calcified. Mnor distal tapering with approximately 20% stenosis. - LAD widely patent to the LV apex. D1 has 30-40% ostial stenosis. Mid LAD just after the first septal perforator has 30-40% stenosis. Vessel is diffusely calcified.  - LCx large in caliber.Ostium has 30% stenosis. OM branches are patent without significant stenosis. The posterolateral branch is patent. - RCA is dominant; vessel is diffusely calcified. Very tight 95% stenosis of the true ostium of the vessel. Mid vessel has scattered 50% stenoses. The PDA and PLA branches are patent. The acute marginal branch is patent. BMS to ostium of RCA.   Nuclear stress test 12/15/13 1. Abnormal Lexiscan Cardiolite stress test. 2. Mild to moderate degree of peri-infarct ischemia seen in the mid inferior wall, mid inferoseptal wall, mid inferolateral wall, and basal inferior wall. 3. Normal left ventricular systolic function, calculated LVEF 59%. 4. This is an intermediate risk study for future major adverse cardiac events.  Echo 09/11/13:  - Left ventricle: The cavity size was normal. Wall thickness was increased in a pattern of moderate LVH. Systolic function was normal. The estimated ejection fraction was in the range of 55% to 60%. Wall motion was normal; there were no regional wall motion abnormalities. The study was not  technically sufficient to allow evaluation of LV diastolic dysfunction due to atrial fibrillation. - Aortic valve: Mildly to moderately calcified annulus. Trileaflet; mildly thickened leaflets. There was no stenosis. - Mitral valve: Calcified annulus. Mildly thickened leaflets. Mild regurgitation. - Left atrium: The atrium was mildly dilated. - Tricuspid valve: Mild regurgitation. - Pulmonic valve: Moderate regurgitation.  Pt has cardiac clearance for surgery from. Dr.Koneswaran in [place] telephone encounter dated 08/31/15.   If labs acceptable DOS, I anticipate pt can proceed as scheduled.   Willeen Cass, FNP-BC [facility]/Anesthesiology Phone: [phone] 09/08/2015 9:29 AM

## 2015-09-09 ENCOUNTER — Encounter (HOSPITAL_COMMUNITY)
Admission: RE | Disposition: A | Discharge status: Skilled Nursing Facility | Payer: Self-pay | Source: Ambulatory Visit | Attending: Otolaryngology

## 2015-09-09 ENCOUNTER — Encounter (HOSPITAL_COMMUNITY): Payer: Self-pay | Admitting: *Deleted

## 2015-09-09 ENCOUNTER — Inpatient Hospital Stay (HOSPITAL_COMMUNITY)
Admission: RE | Admit: 2015-09-09 | Discharge: 2015-09-16 | DRG: 327 | Disposition: A | Discharge status: Skilled Nursing Facility | Payer: Medicare Other | Source: Ambulatory Visit | Attending: Otolaryngology | Admitting: Otolaryngology

## 2015-09-09 ENCOUNTER — Ambulatory Visit (HOSPITAL_COMMUNITY): Payer: Medicare Other | Admitting: Emergency Medicine

## 2015-09-09 DIAGNOSIS — K225 Diverticulum of esophagus, acquired: Secondary | ICD-10-CM | POA: Diagnosis not present

## 2015-09-09 DIAGNOSIS — Z955 Presence of coronary angioplasty implant and graft: Secondary | ICD-10-CM

## 2015-09-09 DIAGNOSIS — I10 Essential (primary) hypertension: Secondary | ICD-10-CM | POA: Diagnosis present

## 2015-09-09 DIAGNOSIS — Z931 Gastrostomy status: Secondary | ICD-10-CM

## 2015-09-09 DIAGNOSIS — I2510 Atherosclerotic heart disease of native coronary artery without angina pectoris: Secondary | ICD-10-CM | POA: Diagnosis present

## 2015-09-09 DIAGNOSIS — I252 Old myocardial infarction: Secondary | ICD-10-CM

## 2015-09-09 DIAGNOSIS — Z7982 Long term (current) use of aspirin: Secondary | ICD-10-CM | POA: Diagnosis not present

## 2015-09-09 DIAGNOSIS — L8990 Pressure ulcer of unspecified site, unspecified stage: Secondary | ICD-10-CM | POA: Insufficient documentation

## 2015-09-09 DIAGNOSIS — E870 Hyperosmolality and hypernatremia: Secondary | ICD-10-CM | POA: Diagnosis not present

## 2015-09-09 DIAGNOSIS — Z79899 Other long term (current) drug therapy: Secondary | ICD-10-CM

## 2015-09-09 DIAGNOSIS — R1314 Dysphagia, pharyngoesophageal phase: Secondary | ICD-10-CM | POA: Diagnosis not present

## 2015-09-09 DIAGNOSIS — H9190 Unspecified hearing loss, unspecified ear: Secondary | ICD-10-CM | POA: Diagnosis present

## 2015-09-09 DIAGNOSIS — R51 Headache: Secondary | ICD-10-CM | POA: Diagnosis present

## 2015-09-09 DIAGNOSIS — Z87891 Personal history of nicotine dependence: Secondary | ICD-10-CM

## 2015-09-09 DIAGNOSIS — I4891 Unspecified atrial fibrillation: Secondary | ICD-10-CM | POA: Diagnosis not present

## 2015-09-09 DIAGNOSIS — I160 Hypertensive urgency: Secondary | ICD-10-CM | POA: Diagnosis not present

## 2015-09-09 DIAGNOSIS — G4733 Obstructive sleep apnea (adult) (pediatric): Secondary | ICD-10-CM | POA: Diagnosis present

## 2015-09-09 DIAGNOSIS — Z8551 Personal history of malignant neoplasm of bladder: Secondary | ICD-10-CM

## 2015-09-09 DIAGNOSIS — Z4659 Encounter for fitting and adjustment of other gastrointestinal appliance and device: Secondary | ICD-10-CM

## 2015-09-09 DIAGNOSIS — Z9849 Cataract extraction status, unspecified eye: Secondary | ICD-10-CM

## 2015-09-09 DIAGNOSIS — E876 Hypokalemia: Secondary | ICD-10-CM | POA: Diagnosis not present

## 2015-09-09 DIAGNOSIS — I4892 Unspecified atrial flutter: Secondary | ICD-10-CM | POA: Diagnosis not present

## 2015-09-09 HISTORY — PX: ZENKER'S DIVERTICULECTOMY: SHX5223

## 2015-09-09 HISTORY — PX: ZENKER'S DIVERTICULECTOMY ENDOSCOPIC: SHX6191

## 2015-09-09 LAB — CBC
HCT: 40.1 % (ref 39.0–52.0)
HEMOGLOBIN: 13.6 g/dL (ref 13.0–17.0)
MCH: 32.1 pg (ref 26.0–34.0)
MCHC: 33.9 g/dL (ref 30.0–36.0)
MCV: 94.6 fL (ref 78.0–100.0)
Platelets: 174 10*3/uL (ref 150–400)
RBC: 4.24 MIL/uL (ref 4.22–5.81)
RDW: 12.8 % (ref 11.5–15.5)
WBC: 7 10*3/uL (ref 4.0–10.5)

## 2015-09-09 LAB — BASIC METABOLIC PANEL
ANION GAP: 13 (ref 5–15)
BUN: 23 mg/dL — ABNORMAL HIGH (ref 6–20)
CALCIUM: 9.4 mg/dL (ref 8.9–10.3)
CO2: 24 mmol/L (ref 22–32)
CREATININE: 0.79 mg/dL (ref 0.61–1.24)
Chloride: 104 mmol/L (ref 101–111)
GFR calc Af Amer: >60 mL/min (ref >60)
GFR calc non Af Amer: >60 mL/min (ref >60)
Glucose, Bld: 106 mg/dL — ABNORMAL HIGH (ref 65–99)
Potassium: 4.2 mmol/L (ref 3.5–5.1)
Sodium: 141 mmol/L (ref 135–145)

## 2015-09-09 SURGERY — ZENKER'S DIVERTICULECTOMY
Anesthesia: General | Site: Throat

## 2015-09-09 MED ORDER — FERROUS SULFATE 325 (65 FE) MG PO TABS: 325.0000 mg | ORAL_TABLET | Freq: Every day | ORAL | Status: DC

## 2015-09-09 MED ORDER — NITROGLYCERIN 0.4 MG SL SUBL: 0.4000 mg | SUBLINGUAL_TABLET | SUBLINGUAL | Status: DC | PRN

## 2015-09-09 MED ORDER — PROMETHAZINE HCL 25 MG PO TABS: 25.0000 mg | ORAL_TABLET | Freq: Four times a day (QID) | ORAL | Status: DC | PRN

## 2015-09-09 MED ORDER — PROMETHAZINE HCL 25 MG/ML IJ SOLN: 12.5000 mg | Freq: Four times a day (QID) | INTRAMUSCULAR | Status: DC | PRN

## 2015-09-09 MED ORDER — SUGAMMADEX SODIUM 200 MG/2ML IV SOLN
INTRAVENOUS | Status: DC | PRN
  Administered 2015-09-09: 200 mg via INTRAVENOUS

## 2015-09-09 MED ORDER — LIDOCAINE VISCOUS 2 % MT SOLN
15.0000 mL | OROMUCOSAL | Status: DC | PRN
  Administered 2015-09-09: 15 mL via OROMUCOSAL
  Filled 2015-09-09 (×2): qty 15

## 2015-09-09 MED ORDER — ROCURONIUM BROMIDE 50 MG/5ML IV SOLN
INTRAVENOUS | Status: AC
  Filled 2015-09-09: qty 2

## 2015-09-09 MED ORDER — PROMETHAZINE HCL 25 MG RE SUPP: 25.0000 mg | Freq: Four times a day (QID) | RECTAL | Status: DC | PRN

## 2015-09-09 MED ORDER — MORPHINE SULFATE (PF) 2 MG/ML IV SOLN
2.0000 mg | INTRAVENOUS | Status: DC | PRN
  Administered 2015-09-09 (×2): 2 mg via INTRAVENOUS
  Filled 2015-09-09 (×2): qty 1

## 2015-09-09 MED ORDER — DEXAMETHASONE SODIUM PHOSPHATE 10 MG/ML IJ SOLN
INTRAMUSCULAR | Status: AC
  Filled 2015-09-09: qty 1

## 2015-09-09 MED ORDER — HYDROCODONE-ACETAMINOPHEN 7.5-325 MG/15ML PO SOLN: 15.0000 mL | Freq: Four times a day (QID) | ORAL | Status: DC | PRN

## 2015-09-09 MED ORDER — ONDANSETRON HCL 4 MG/2ML IJ SOLN
INTRAMUSCULAR | Status: AC
  Filled 2015-09-09: qty 2

## 2015-09-09 MED ORDER — LABETALOL HCL 5 MG/ML IV SOLN
5.0000 mg | Freq: Four times a day (QID) | INTRAVENOUS | Status: DC | PRN
  Filled 2015-09-09: qty 4

## 2015-09-09 MED ORDER — CARVEDILOL 3.125 MG PO TABS
3.1250 mg | ORAL_TABLET | Freq: Once | ORAL | Status: AC
  Administered 2015-09-09: 3.125 mg via ORAL
  Filled 2015-09-09 (×2): qty 1

## 2015-09-09 MED ORDER — LORATADINE 10 MG PO TABS: 10.0000 mg | ORAL_TABLET | Freq: Every day | ORAL | Status: DC

## 2015-09-09 MED ORDER — IBUPROFEN 100 MG/5ML PO SUSP
400.0000 mg | Freq: Four times a day (QID) | ORAL | Status: DC | PRN
  Filled 2015-09-09: qty 20

## 2015-09-09 MED ORDER — LOSARTAN POTASSIUM 50 MG PO TABS
100.0000 mg | ORAL_TABLET | Freq: Every day | ORAL | Status: DC
  Administered 2015-09-09: 100 mg via ORAL
  Filled 2015-09-09: qty 2

## 2015-09-09 MED ORDER — EPINEPHRINE HCL (NASAL) 0.1 % NA SOLN
NASAL | Status: AC
  Filled 2015-09-09: qty 30

## 2015-09-09 MED ORDER — FENTANYL CITRATE (PF) 100 MCG/2ML IJ SOLN
INTRAMUSCULAR | Status: AC
  Filled 2015-09-09: qty 2

## 2015-09-09 MED ORDER — SODIUM CHLORIDE 0.9 % IJ SOLN
INTRAMUSCULAR | Status: AC
  Filled 2015-09-09: qty 10

## 2015-09-09 MED ORDER — EPHEDRINE SULFATE 50 MG/ML IJ SOLN
INTRAMUSCULAR | Status: AC
  Filled 2015-09-09: qty 1

## 2015-09-09 MED ORDER — FENTANYL CITRATE (PF) 250 MCG/5ML IJ SOLN
INTRAMUSCULAR | Status: AC
  Filled 2015-09-09: qty 5

## 2015-09-09 MED ORDER — ASPIRIN EC 325 MG PO TBEC: 325.0000 mg | DELAYED_RELEASE_TABLET | Freq: Every day | ORAL | Status: DC

## 2015-09-09 MED ORDER — KETOROLAC TROMETHAMINE 30 MG/ML IJ SOLN
15.0000 mg | Freq: Once | INTRAMUSCULAR | Status: AC
  Administered 2015-09-09: 15 mg via INTRAVENOUS
  Filled 2015-09-09: qty 1

## 2015-09-09 MED ORDER — AMLODIPINE BESYLATE 5 MG PO TABS
5.0000 mg | ORAL_TABLET | Freq: Every morning | ORAL | Status: DC
  Administered 2015-09-09: 5 mg via ORAL
  Filled 2015-09-09: qty 1

## 2015-09-09 MED ORDER — VITAMIN B-12 1000 MCG PO TABS: 1000.0000 ug | ORAL_TABLET | Freq: Every day | ORAL | Status: DC

## 2015-09-09 MED ORDER — ATORVASTATIN CALCIUM 20 MG PO TABS: 20.0000 mg | ORAL_TABLET | Freq: Every day | ORAL | Status: DC

## 2015-09-09 MED ORDER — FENTANYL CITRATE (PF) 100 MCG/2ML IJ SOLN
INTRAMUSCULAR | Status: DC | PRN
  Administered 2015-09-09: 100 ug via INTRAVENOUS

## 2015-09-09 MED ORDER — ACETAMINOPHEN 500 MG PO TABS
1000.0000 mg | ORAL_TABLET | Freq: Four times a day (QID) | ORAL | Status: DC | PRN
  Administered 2015-09-14: 1000 mg via ORAL
  Filled 2015-09-09 (×2): qty 2

## 2015-09-09 MED ORDER — PROPOFOL 10 MG/ML IV BOLUS
INTRAVENOUS | Status: AC
  Filled 2015-09-09: qty 20

## 2015-09-09 MED ORDER — HYDROCHLOROTHIAZIDE 25 MG PO TABS
25.0000 mg | ORAL_TABLET | Freq: Every day | ORAL | Status: DC
  Administered 2015-09-09: 25 mg via ORAL
  Filled 2015-09-09: qty 1

## 2015-09-09 MED ORDER — DEXTROSE-NACL 5-0.9 % IV SOLN
INTRAVENOUS | Status: DC
  Administered 2015-09-09 – 2015-09-11 (×4): via INTRAVENOUS
  Administered 2015-09-11: 100 mL via INTRAVENOUS
  Administered 2015-09-11 – 2015-09-13 (×4): via INTRAVENOUS

## 2015-09-09 MED ORDER — HYDRALAZINE HCL 20 MG/ML IJ SOLN: 10.0000 mg | INTRAMUSCULAR | Status: DC | PRN

## 2015-09-09 MED ORDER — ROCURONIUM BROMIDE 100 MG/10ML IV SOLN
INTRAVENOUS | Status: DC | PRN
  Administered 2015-09-09: 20 mg via INTRAVENOUS
  Administered 2015-09-09: 30 mg via INTRAVENOUS

## 2015-09-09 MED ORDER — DEXAMETHASONE SODIUM PHOSPHATE 10 MG/ML IJ SOLN
INTRAMUSCULAR | Status: DC | PRN
  Administered 2015-09-09: 10 mg via INTRAVENOUS

## 2015-09-09 MED ORDER — LIDOCAINE HCL (CARDIAC) 20 MG/ML IV SOLN
INTRAVENOUS | Status: AC
  Filled 2015-09-09: qty 10

## 2015-09-09 MED ORDER — LOSARTAN POTASSIUM-HCTZ 100-25 MG PO TABS: 1.0000 | ORAL_TABLET | Freq: Every day | ORAL | Status: DC

## 2015-09-09 MED ORDER — HYDRALAZINE HCL 20 MG/ML IJ SOLN
5.0000 mg | INTRAMUSCULAR | Status: DC | PRN
  Administered 2015-09-09: 5 mg via INTRAVENOUS
  Filled 2015-09-09: qty 1

## 2015-09-09 MED ORDER — CARVEDILOL 3.125 MG PO TABS: 3.1250 mg | ORAL_TABLET | Freq: Every evening | ORAL | Status: DC

## 2015-09-09 MED ORDER — FENTANYL CITRATE (PF) 100 MCG/2ML IJ SOLN
25.0000 ug | INTRAMUSCULAR | Status: DC | PRN
  Administered 2015-09-09 (×2): 25 ug via INTRAVENOUS

## 2015-09-09 MED ORDER — SUGAMMADEX SODIUM 200 MG/2ML IV SOLN
INTRAVENOUS | Status: AC
  Filled 2015-09-09: qty 2

## 2015-09-09 MED ORDER — HYDROCODONE-ACETAMINOPHEN 7.5-325 MG/15ML PO SOLN
10.0000 mL | ORAL | Status: DC | PRN
  Administered 2015-09-09 – 2015-09-13 (×5): 15 mL via ORAL
  Filled 2015-09-09 (×7): qty 15

## 2015-09-09 MED ORDER — LIDOCAINE HCL (CARDIAC) 20 MG/ML IV SOLN
INTRAVENOUS | Status: DC | PRN
  Administered 2015-09-09: 100 mg via INTRAVENOUS

## 2015-09-09 MED ORDER — CHOLECALCIFEROL 10 MCG (400 UNIT) PO TABS
400.0000 [IU] | ORAL_TABLET | Freq: Every day | ORAL | Status: DC
  Filled 2015-09-09: qty 1

## 2015-09-09 MED ORDER — SODIUM CHLORIDE 0.9 % IR SOLN
Status: DC | PRN
  Administered 2015-09-09: 1000 mL

## 2015-09-09 MED ORDER — ADULT MULTIVITAMIN W/MINERALS CH: 1.0000 | ORAL_TABLET | Freq: Every morning | ORAL | Status: DC

## 2015-09-09 MED ORDER — CLINDAMYCIN PHOSPHATE 900 MG/50ML IV SOLN
900.0000 mg | INTRAVENOUS | Status: AC
  Administered 2015-09-09: 900 mg via INTRAVENOUS
  Filled 2015-09-09: qty 50

## 2015-09-09 MED ORDER — MORPHINE SULFATE (PF) 4 MG/ML IV SOLN
4.0000 mg | INTRAVENOUS | Status: DC | PRN
  Administered 2015-09-09 – 2015-09-13 (×14): 4 mg via INTRAVENOUS
  Filled 2015-09-09 (×14): qty 1

## 2015-09-09 MED ORDER — BACITRACIN-NEOMYCIN-POLYMYXIN 400-5-5000 EX OINT
TOPICAL_OINTMENT | CUTANEOUS | Status: AC
  Filled 2015-09-09: qty 1

## 2015-09-09 MED ORDER — LACTATED RINGERS IV SOLN
INTRAVENOUS | Status: DC
  Administered 2015-09-09 (×2): via INTRAVENOUS

## 2015-09-09 MED ORDER — LIDOCAINE-EPINEPHRINE 1 %-1:100000 IJ SOLN
INTRAMUSCULAR | Status: AC
  Filled 2015-09-09: qty 1

## 2015-09-09 MED ORDER — OSTEO BI-FLEX ADV JOINT SHIELD PO TABS: 1.0000 | ORAL_TABLET | Freq: Two times a day (BID) | ORAL | Status: DC

## 2015-09-09 SURGICAL SUPPLY — 46 items
BLADE SURG 15 STRL LF DISP TIS (BLADE) IMPLANT
BLADE SURG 15 STRL SS (BLADE)
CATH ROBINSON RED A/P 18FR (CATHETERS) IMPLANT
CLEANER TIP ELECTROSURG 2X2 (MISCELLANEOUS) IMPLANT
COVER SURGICAL LIGHT HANDLE (MISCELLANEOUS) IMPLANT
COVER TABLE BACK 60X90 (DRAPES) ×2 IMPLANT
CRADLE DONUT ADULT HEAD (MISCELLANEOUS) IMPLANT
CUTTER LINEAR ENDO 35 ART FLEX (STAPLE) ×2 IMPLANT
CUTTER LINEAR ENDO 35 ETS (STAPLE) IMPLANT
DRAIN JACKSON RD 7FR 3/32 (WOUND CARE) IMPLANT
DRAPE PROXIMA HALF (DRAPES) ×2 IMPLANT
ELECT COATED BLADE 2.86 ST (ELECTRODE) IMPLANT
ELECT REM PT RETURN 9FT ADLT (ELECTROSURGICAL)
ELECTRODE REM PT RTRN 9FT ADLT (ELECTROSURGICAL) IMPLANT
EVACUATOR SILICONE 100CC (DRAIN) IMPLANT
GAUZE SPONGE 4X4 12PLY STRL (GAUZE/BANDAGES/DRESSINGS) IMPLANT
GLOVE ECLIPSE 7.5 STRL STRAW (GLOVE) ×2 IMPLANT
GOWN STRL REUS W/ TWL LRG LVL3 (GOWN DISPOSABLE) ×1 IMPLANT
GOWN STRL REUS W/TWL LRG LVL3 (GOWN DISPOSABLE) ×1
GUARD TEETH (MISCELLANEOUS) ×2 IMPLANT
KIT ROOM TURNOVER OR (KITS) ×2 IMPLANT
NEEDLE 27GAX1X1/2 (NEEDLE) IMPLANT
NS IRRIG 1000ML POUR BTL (IV SOLUTION) ×2 IMPLANT
PAD ARMBOARD 7.5X6 YLW CONV (MISCELLANEOUS) ×4 IMPLANT
PATTIES SURGICAL .5 X3 (DISPOSABLE) IMPLANT
PENCIL FOOT CONTROL (ELECTRODE) IMPLANT
RELOAD CUTTER ETS 35MM STAND (ENDOMECHANICALS) ×2 IMPLANT
RELOAD STAPLER LINE PROX 30 GR (STAPLE) IMPLANT
SOLUTION ANTI FOG 6CC (MISCELLANEOUS) ×2 IMPLANT
SPECIMEN JAR SMALL (MISCELLANEOUS) IMPLANT
SPONGE INTESTINAL PEANUT (DISPOSABLE) IMPLANT
STAPLER RELOAD LINE PROX 30 GR (STAPLE)
STAPLER VISISTAT 35W (STAPLE) IMPLANT
SURGILUBE 2OZ TUBE FLIPTOP (MISCELLANEOUS) ×2 IMPLANT
SUT ETHILON 5 0 P 3 18 (SUTURE)
SUT NYLON ETHILON 5-0 P-3 1X18 (SUTURE) IMPLANT
SUT SILK 3 0 (SUTURE)
SUT SILK 3-0 18XBRD TIE 12 (SUTURE) IMPLANT
SUT VIC AB 4-0 RB1 27 (SUTURE)
SUT VIC AB 4-0 RB1 27X BRD (SUTURE) IMPLANT
SYR BULB IRRIGATION 50ML (SYRINGE) IMPLANT
TOWEL OR 17X24 6PK STRL BLUE (TOWEL DISPOSABLE) ×2 IMPLANT
TRAY ENT MC OR (CUSTOM PROCEDURE TRAY) ×2 IMPLANT
TUBE CONNECTING 12X1/4 (SUCTIONS) IMPLANT
TUBE FEEDING 10FR FLEXIFLO (MISCELLANEOUS) IMPLANT
WATER STERILE IRR 1000ML POUR (IV SOLUTION) IMPLANT

## 2015-09-09 NOTE — Progress Notes (Signed)
Patient ID: Manuel Le, male   DOB: [birth year], 82 y.o.   MRN: [id number] Complaining of difficulty swallowing and pain in upper teeth and head.  He has been unable to swallow liquids or medications.  There was some bleeding that he spit out for a while after surgery that has improved.  He is spitting out all of his secretion. AF VSS Alert NAD, appears uncomfortable. Right upper lip and inferior to lower lip bruising.  Normal voice. Neck with some laryngeal tenderness but no crepitance. A/P: s/p endoscopic Zenker's diverticulotomy with dysphagia postop  Hospitalist consulted to assist with medications.  Agree with dose of Toradol.  Hydration through IV.

## 2015-09-09 NOTE — Anesthesia Procedure Notes (Signed)
Procedure Name: Intubation Date/Time: 09/09/2015 9:46 AM Performed by: Tamala Fothergill S Patient Re-evaluated:Patient Re-evaluated prior to inductionOxygen Delivery Method: Circle system utilized Preoxygenation: Pre-oxygenation with 100% oxygen Intubation Type: IV induction Ventilation: Mask ventilation without difficulty Laryngoscope Size: Miller and 3 Grade View: Grade II Tube type: Oral Tube size: 7.0 mm Number of attempts: 1 Placement Confirmation: ETT inserted through vocal cords under direct vision,  breath sounds checked- equal and bilateral and positive ETCO2 Tube secured with: Tape Dental Injury: Teeth and Oropharynx as per pre-operative assessment

## 2015-09-09 NOTE — Progress Notes (Addendum)
Patient using oral yaunkauer for copious blood tinged secretions building up in mouth with 228ml in the past 45 minutes. Patient having difficulty swallowing pills saying they are getting "hung up in throat". Concern that patient used yaunkauer and suctioned PO pain and BP medication given. Dr.Rosen contacted and made aware. Telephone order for 2mg  morphine IV Q3 PRN while patient unable to take PO and to monitor bleeding. MD order okay to hold ASA 325mg  until patient's swallowing improves. Patient made aware.

## 2015-09-09 NOTE — Op Note (Signed)
OPERATIVE REPORT  DATE OF SURGERY: 09/09/2015  PATIENT:  Manuel Le,  82 y.o. male  PRE-OPERATIVE DIAGNOSIS:  ZEENKER'S DIVERTICULUM  POST-OPERATIVE DIAGNOSIS:  ZEENKER'S DIVERTICULUM  PROCEDURE:  Procedure(s): ENDOSCOPIC ZENKER'S DIVERTICULECTOMY  SURGEON:  Beckie Salts, MD  ASSISTANTS: None  ANESTHESIA:   General   EBL:  10 ml  DRAINS: None  LOCAL MEDICATIONS USED:  None  SPECIMEN:  none  COUNTS:  Correct  PROCEDURE DETAILS: The patient was taken to the operating room and placed on the operating table in the supine position. Following induction of general endotracheal anesthesia, the table was turned 90. The head was draped in a standard fashion. Maxillary and mandibular tooth protectors were used throughout the case. The Weerda laryngoscope was entered into the oral cavity and used to visualize the larynx and hypopharynx. The diverticulum was identified with the upper blade in the esophagus and the lower blade in the diverticulum.  Cured to the Mayo stand with the suspension apparatus. Confirmation of the esophageal lumen was performed using a nasogastric tube. The GI stapler was then used with 2 passes to divide the parting wall between the diverticulu the esophagus. Photographs were taken prior to and after the 2 cuts. The scope was then removed. Patient was then awakened extubaed and transferred to recovery condition.There was some bruising of the right tonsillar area from the endoscope but no bleeding or laceration.    PATIENT DISPOSITION:  To PACU, stable

## 2015-09-09 NOTE — Progress Notes (Addendum)
Dr. Constance Holster contacted to make aware that patient is coughing and struggling with taking PO. Patient reporting mouth/tooth/throat pain with bruising to his mouth. Pt has remained hypertensive since surgery and last BP 176/89. RN concern for swallow safety with PO meds and patient has BP meds to give. Telephone order for viscous lidocaine PRN and to give prior to PO meds to see if it will help with pain, and to call MD back if patient unable to take medications.   4:40 PM RN performed a swallow assessment and patient coughing with water and had gurgling voice after administration. Lidocaine prn medication was given prior to administration of water per MD order. Patient was suctioned and lung sounds remain clear. Dr.Rosen made aware and okay to hold PO medications. MD stated he will address BP medications.

## 2015-09-09 NOTE — Consult Note (Signed)
[facility] Medical Consultation  Manuel Le [id number] DOB: 06-26-1927 DOA: 09/09/2015 PCP: Wende Neighbors, MD   Requesting physician: Dr Constance Holster Date of consultation: 09/09/2015 Reason for consultation: uncontrolled hypertension and unable to take po meds.   Impression/Recommendations Active Problems:   Zenker's diverticulum hypertensive urgency   1. zenkers diverticulum:  -s/p surgery endoscopic zenkers diverticulectomy. Pain control and on clindamycin.    2. Hypertensive urgency: Pt unable to take anything by mouth. Stopped all oral meds including anti hypertensives.  Started him on IV hydralazine and IV labetalol.   3. Headache probably from the above.   I will followup again tomorrow. Please contact me if I can be of assistance in the meanwhile. Thank you for this consultation.  Chief Complaint: uncontrolled bp  HPI:  82 year old gentleman, with h/o hypertension, dysphagia, presents with difficulty swallowing and regurgitation of the undigested food, barium swallow showed a large zenker's diverticulum.   Review of Systems:  Negative except for pain in the throat, neck and mouth.  And headache.   Past Medical History  Diagnosis Date  . Hypertension   . Coronary artery disease   . Iron deficiency anemia   . Bladder cancer ([place])   . Hematuria   . Atrial fibrillation and flutter ([place])   . Mild obstructive sleep apnea     no cpap  . Exertional angina ([place]) 07/08/2014  . Arthritis     OSTEO  . S/P coronary artery stent placement 07/24/2014  . Dysrhythmia   . Kidney stones     hx of   . H/O urinary frequency   . Weakness    Past Surgical History  Procedure Laterality Date  . Colonoscopy  07/04/2012    Procedure: COLONOSCOPY;  Surgeon: Rogene Houston, MD;  [facility]: [facility];  Service: Endoscopy;  Laterality: N/A;  320  . Esophagogastroduodenoscopy  07/04/2012    Procedure: ESOPHAGOGASTRODUODENOSCOPY (EGD);  Surgeon: Rogene Houston, MD;  Location:  [facility];  Service: Endoscopy;  Laterality: N/A;  . Cardiovascular stress test  12-15-2013  DR Kate Sable ([place])    MILD - MODERATE PERI-INFARCT ISCHEMIA  OF INFERIOR WALL, MID-INFEROSEPTAL WALL, MID-INFEROLATERAL WALL, & BASAL INFERIOR WALL/   NORMAL LVF /  EF 59%/  INTERMEDIATE RISK STUDY  . Transthoracic echocardiogram  09-11-2013    MODERATE LVH/  EF 55-60%/  MILD MR & TR/  MILD TO MODERATE CALCIFIED AV WITHOUT STENOSIS/  MILD LAE/   MODERATE PR  . Total knee arthroplasty Bilateral 2000  &  2003?  Marland Kitchen Hernia repair Right   . Transurethral resection of bladder tumor with gyrus (turbt-gyrus) N/A 12/31/2013    Procedure: TRANSURETHRAL RESECTION OF BLADDER TUMOR WITH GYRUS (TURBT-GYRUS);  Surgeon: Alexis Frock, MD;  Location: [facility];  Service: Urology;  Laterality: N/A;  . Cystoscopy w/ retrogrades Bilateral 12/31/2013    Procedure: CYSTOSCOPY WITH RETROGRADE PYELOGRAM;  Surgeon: Alexis Frock, MD;  Location: [facility];  Service: Urology;  Laterality: Bilateral;  . Coronary stent placement  07/08/2014    PTCA of RCA   DR COOPER  . Left heart catheterization with coronary angiogram N/A 07/08/2014    Procedure: LEFT HEART CATHETERIZATION WITH CORONARY ANGIOGRAM;  Surgeon: Blane Ohara, MD;  Location: [facility] CATH LAB;  Service: Cardiovascular;  Laterality: N/A;  . Percutaneous coronary stent intervention (pci-s)  07/08/2014    Procedure: PERCUTANEOUS CORONARY STENT INTERVENTION (PCI-S);  Surgeon: Blane Ohara, MD;  Location: [facility] CATH LAB;  Service: Cardiovascular;;  .  Eye surgery      past cataract surgery bilat  . Transurethral resection of bladder tumor N/A 01/20/2015    Procedure: TRANSURETHRAL RESECTION OF BLADDER TUMOR (TURBT);  Surgeon: Alexis Frock, MD;  Location: [facility];  Service: Urology;  Laterality: N/A;  . Cystoscopy w/ retrogrades Bilateral 01/20/2015    Procedure: CYSTOSCOPY WITH RETROGRADE PYELOGRAM;  Surgeon: Alexis Frock, MD;  Location: [facility];  Service:  Urology;  Laterality: Bilateral;  . Cardiac catheterization    . Zenker's diverticulectomy endoscopic  09/09/2015   Social History:  reports that he quit smoking about 47 years ago. His smoking use included Cigarettes, Cigars, and Pipe. He has a 40 pack-year smoking history. He has never used smokeless tobacco. He reports that he does not drink alcohol or use illicit drugs.  No Known Allergies History reviewed. No pertinent family history.  Prior to Admission medications   Medication Sig Start Date End Date Taking? Authorizing Provider  acetaminophen (TYLENOL) 500 MG tablet Take 1,000 mg by mouth every 6 (six) hours as needed for mild pain. Pain   Yes Historical Provider, MD  amLODipine (NORVASC) 5 MG tablet Take 5 mg by mouth every morning.  08/16/13  Yes Historical Provider, MD  aspirin EC 325 MG tablet Take 1 tablet (325 mg total) by mouth daily. 01/25/15  Yes Herminio Commons, MD  atorvastatin (LIPITOR) 20 MG tablet TAKE ONE TABLET BY MOUTH DAILY AT 6 P.M. 06/18/15  Yes Herminio Commons, MD  carvedilol (COREG) 3.125 MG tablet Take 1 tablet (3.125 mg total) by mouth every evening. 08/24/15  Yes Herminio Commons, MD  cetirizine (ZYRTEC) 10 MG tablet Take 10 mg by mouth every morning.    Yes Historical Provider, MD  Cholecalciferol (VITAMIN D-3) 1000 UNITS CAPS Take 1 capsule by mouth daily.   Yes Historical Provider, MD  ferrous sulfate 325 (65 FE) MG EC tablet Take 325 mg by mouth daily with breakfast.   Yes Historical Provider, MD  losartan-hydrochlorothiazide (HYZAAR) 100-25 MG per tablet Take 1 tablet by mouth daily.   Yes Historical Provider, MD  Misc Natural Products (OSTEO BI-FLEX ADV JOINT SHIELD) TABS Take 1 tablet by mouth 2 (two) times daily.    Yes Historical Provider, MD  Multiple Vitamins-Minerals (CENTRUM SILVER ADULT 50+) TABS Take 1 tablet by mouth every morning.   Yes Historical Provider, MD  nitroGLYCERIN (NITROSTAT) 0.4 MG SL tablet Place 1 tablet (0.4 mg total) under  the tongue every 5 (five) minutes as needed for chest pain. 07/03/14  Yes Herminio Commons, MD  vitamin B-12 (CYANOCOBALAMIN) 1000 MCG tablet Take 1,000 mcg by mouth daily.   Yes Historical Provider, MD  HYDROcodone-acetaminophen (HYCET) 7.5-325 mg/15 ml solution Take 15 mLs by mouth 4 (four) times daily as needed for moderate pain. 09/09/15   Izora Gala, MD  promethazine (PHENERGAN) 25 MG suppository Place 1 suppository (25 mg total) rectally every 6 (six) hours as needed for nausea or vomiting. 09/09/15   Izora Gala, MD   Physical Exam: Blood pressure 164/76, pulse 95, temperature 98.4 F (36.9 C), temperature source Oral, resp. rate 20, height 6' (1.829 m), weight 86.183 kg (190 lb), SpO2 98 %. Filed Vitals:   09/09/15 1805 09/09/15 1835  BP: 171/75 164/76  Pulse: 92 95  Temp:    Resp:       General:  Alert, in mild distress from headache and pain in the neck.   Eyes: PERLA, normal conjunctiva.  Cardiovascular: s1s2  Respiratory: clear , no wheezing  or rhonchi  Abdomen: soft non tender non distended bowel sounds  Skin: no rash.  Musculoskeletal: no pedal edema.   Neurologic: alert and oriented. No new deficits.   Labs on Admission:  Basic Metabolic Panel:  Recent Labs Lab 09/09/15 0740  NA 141  K 4.2  CL 104  CO2 24  GLUCOSE 106*  BUN 23*  CREATININE 0.79  CALCIUM 9.4   Liver Function Tests: No results for input(s): AST, ALT, ALKPHOS, BILITOT, PROT, ALBUMIN in the last 168 hours. No results for input(s): LIPASE, AMYLASE in the last 168 hours. No results for input(s): AMMONIA in the last 168 hours. CBC:  Recent Labs Lab 09/09/15 0740  WBC 7.0  HGB 13.6  HCT 40.1  MCV 94.6  PLT 174   Cardiac Enzymes: No results for input(s): CKTOTAL, CKMB, CKMBINDEX, TROPONINI in the last 168 hours. BNP: Invalid input(s): POCBNP CBG: No results for input(s): GLUCAP in the last 168 hours.  Radiological Exams on Admission: No results found.  EKG: NOT DONE  this admission.  Time spent: 45 minutes.   [facility] Pager [phone]  If 7PM-7AM, please contact night-coverage [email or web address] Password [facility] 09/09/2015, 7:38 PM

## 2015-09-09 NOTE — Anesthesia Postprocedure Evaluation (Signed)
Anesthesia Post Note  Patient: Manuel Le  Procedure(s) Performed: Procedure(s) (LRB): ENDOSCOPIC ZENKER'S DIVERTICULECTOMY (N/A)  Patient location during evaluation: PACU Anesthesia Type: General Level of consciousness: awake and alert, oriented and patient cooperative Pain management: pain level controlled Vital Signs Assessment: post-procedure vital signs reviewed and stable Respiratory status: spontaneous breathing, nonlabored ventilation and respiratory function stable Cardiovascular status: blood pressure returned to baseline and stable Postop Assessment: no signs of nausea or vomiting Anesthetic complications: no    Last Vitals:  Filed Vitals:   09/09/15 1130 09/09/15 1145  BP: 174/82 176/75  Pulse: 71 71  Temp:  36.2 C  Resp: 20 21    Last Pain:  Filed Vitals:   09/09/15 1151  PainSc: 4                  Leven Hoel,E. Aqsa Sensabaugh

## 2015-09-09 NOTE — Interval H&P Note (Signed)
History and Physical Interval Note:  09/09/2015 9:22 AM  Manuel Le  has presented today for surgery, with the diagnosis of ZEENKER'S DIVERTICULUM  The various methods of treatment have been discussed with the patient and family. After consideration of risks, benefits and other options for treatment, the patient has consented to  Procedure(s): ENDOSCOPIC ZENKER'S DIVERTICULECTOMY (N/A) as a surgical intervention .  The patient's history has been reviewed, patient examined, no change in status, stable for surgery.  I have reviewed the patient's chart and labs.  Questions were answered to the patient's satisfaction.     Ruel Dimmick

## 2015-09-09 NOTE — Anesthesia Preprocedure Evaluation (Addendum)
Anesthesia Evaluation  Patient identified by MRN, date of birth, ID band Patient awake    Reviewed: Allergy & Precautions, NPO status , Patient's Chart, lab work & pertinent test results, reviewed documented beta blocker date and time   History of Anesthesia Complications Negative for: history of anesthetic complications  Airway Mallampati: II  TM Distance: >3 FB Neck ROM: Full    Dental  (+) Dental Advisory Given   Pulmonary sleep apnea (mild, no CPAP) , former smoker (quit 1970),    breath sounds clear to auscultation       Cardiovascular hypertension, Pt. on medications and Pt. on home beta blockers (-) angina+ CAD and + Cardiac Stents (BMS RCA)  + dysrhythmias Atrial Fibrillation  Rhythm:Regular Rate:Normal  '15 ECHO: EF 55-60%, mild MR '15 Stress: Mild to moderate degree of peri-infarct ischemia seen in the mid nferior wall, mid inferoseptal wall, mid inferolateral wall, and basal inferior wall. Normal LVF, EF 59%   Neuro/Psych negative neurological ROS     GI/Hepatic negative GI ROS, Neg liver ROS,   Endo/Other  negative endocrine ROS  Renal/GU negative Renal ROS   Bladder cancer    Musculoskeletal  (+) Arthritis ,   Abdominal   Peds  Hematology negative hematology ROS (+)   Anesthesia Other Findings   Reproductive/Obstetrics                         Anesthesia Physical Anesthesia Plan  ASA: III  Anesthesia Plan: General   Post-op Pain Management:    Induction: Intravenous  Airway Management Planned: Oral ETT  Additional Equipment:   Intra-op Plan:   Post-operative Plan: Extubation in OR  Informed Consent: I have reviewed the patients History and Physical, chart, labs and discussed the procedure including the risks, benefits and alternatives for the proposed anesthesia with the patient or authorized representative who has indicated his/her understanding and acceptance.    Dental advisory given  Plan Discussed with: CRNA and Surgeon  Anesthesia Plan Comments: (Plan routine monitors, GETA)        Anesthesia Quick Evaluation

## 2015-09-09 NOTE — H&P (View-Only) (Signed)
  Assessment  Zenker's diverticulum (530.6) (K22.5). Pharyngoesophageal dysphagia (787.24) (R13.14). Discussed  Large Zenker's diverticulum with symptomatic dysphasia but no pneumonia, or weight loss. We discussed a surgical option which is endoscopic diverticulectomy. Risks and benefits were discussed. All questions were answered. She will call to schedule if and when he is ready. Reason For Visit  Throat. HPI  5 or 6 month history of difficulty swallowing and regurgitation of undigested food after eating. No history of heartburn or reflux. He hasn't smoked in 30+ years. He doesn't drink or consume caffeine. Barium swallow reveals a large Zenker's diverticulum. Allergies  No Known Drug Allergies. Current Meds  Aspirin EC 81 MG Oral Tablet Delayed Release;; RPT Vitamin B-12 TABS;; RPT Zyrtec TABS;; RPT Ferrous Sulfate TABS;; RPT Centrum Silver TABS;; RPT Tylenol TABS;; RPT AmLODIPine Besylate 5 MG Oral Tablet;; RPT Carvedilol 3.125 MG Oral Tablet;; RPT Osteo Advance TABS;; RPT Losartan Potassium-HCTZ 100-12.5 MG Oral Tablet;; RPT Atorvastatin Calcium 20 MG Oral Tablet;; RPT Glycerine LIQD;; RPT Vitamin D-3 CAPS;; RPT. Active Problems  Hearing loss   (389.9) (H91.90). PMH  History of carcinoma of bladder (V10.51) (Z85.51) History of heart attack 2015 (412) (I25.2). [place]  Cataract Surgery Knee Surgery Lithotripsy Oral Surgery Tooth Extraction. Family Hx  No pertinent family history: Mother. Personal Hx  Never smoker No alcohol use No caffeine use Non-smoker (V49.89) (Z78.9). ROS  12 system ROS was obtained and reviewed on the Health Maintenance form dated today.  Positive responses are shown above.  If the symptom is not checked, the patient has denied it. Vital Signs   Recorded by Iran Sizer on 06 Aug 2015 03:08 PM BP:132/52,  BSA Calculated: 2.10 ,  BMI Calculated: 26.09 ,  Weight: 192 lb 6.4 oz, BMI: 26.1 kg/m2,  Height: 6 ft. Physical Exam  APPEARANCE:  Well developed, well nourished, in no acute distress.  Normal affect, in a pleasant mood.  Oriented to time, place and person. COMMUNICATION: Normal voice   HEAD & FACE:  No scars, lesions or masses of head and face.  Sinuses nontender to palpation.  Salivary glands without mass or tenderness.  Facial strength symmetric.  No facial lesion, scars, or mass. EYES: EOMI with normal primary gaze alignment. Visual acuity grossly intact.  PERRLA EXTERNAL EAR & NOSE: No scars, lesions or masses  EAC & TYMPANIC MEMBRANE:  EAC shows no obstructing lesions or debris and tympanic membranes are normal bilaterally with good movement to insufflation. GROSS HEARING: Normal  TMJ:  Nontender  INTRANASAL EXAM: No polyps or purulence.  NASOPHARYNX: Normal, without lesions. LIPS, TEETH & GUMS: No lip lesions, normal dentition and normal gums. ORAL CAVITY/OROPHARYNX:  Oral mucosa moist without lesion or asymmetry of the palate, tongue, tonsil or posterior pharynx. LARYNX (mirror exam):  No lesions of the epiglottis, false cord or TVC's and cords move well to phonation. HYPOPHARYNX (mirror exam): No lesions, or asymmetry, but there is some apparent pooling of secretions. NECK:  Supple without adenopathy or mass. THYROID:  Normal with no masses palpable.  NEUROLOGIC:  No gross CN deficits. No nystagmus noted.   LYMPHATIC:  No enlarged nodes palpable. Signature  Electronically signed by : Izora Gala  M.D.;

## 2015-09-09 NOTE — Transfer of Care (Signed)
Immediate Anesthesia Transfer of Care Note  Patient: Manuel Le  Procedure(s) Performed: Procedure(s): ENDOSCOPIC ZENKER'S DIVERTICULECTOMY (N/A)  Patient Location: PACU  Anesthesia Type:General  Level of Consciousness: awake, alert  and oriented  Airway & Oxygen Therapy: Patient Spontanous Breathing and Patient connected to nasal cannula oxygen  Post-op Assessment: Report given to RN and Post -op Vital signs reviewed and stable  Post vital signs: Reviewed and stable  Last Vitals:  Filed Vitals:   09/09/15 0744 09/09/15 1047  BP: 184/72 161/87  Pulse: 68 74  Temp: 36.1 C 36.4 C  Resp: 18 16    Complications: No apparent anesthesia complications

## 2015-09-10 ENCOUNTER — Encounter (HOSPITAL_COMMUNITY): Payer: Self-pay | Admitting: Otolaryngology

## 2015-09-10 DIAGNOSIS — Z7982 Long term (current) use of aspirin: Secondary | ICD-10-CM | POA: Diagnosis not present

## 2015-09-10 DIAGNOSIS — Z8551 Personal history of malignant neoplasm of bladder: Secondary | ICD-10-CM | POA: Diagnosis not present

## 2015-09-10 DIAGNOSIS — R070 Pain in throat: Secondary | ICD-10-CM | POA: Diagnosis not present

## 2015-09-10 DIAGNOSIS — Z931 Gastrostomy status: Secondary | ICD-10-CM | POA: Diagnosis not present

## 2015-09-10 DIAGNOSIS — R278 Other lack of coordination: Secondary | ICD-10-CM | POA: Diagnosis not present

## 2015-09-10 DIAGNOSIS — Z87891 Personal history of nicotine dependence: Secondary | ICD-10-CM | POA: Diagnosis not present

## 2015-09-10 DIAGNOSIS — I4891 Unspecified atrial fibrillation: Secondary | ICD-10-CM | POA: Diagnosis not present

## 2015-09-10 DIAGNOSIS — Z79899 Other long term (current) drug therapy: Secondary | ICD-10-CM | POA: Diagnosis not present

## 2015-09-10 DIAGNOSIS — I25118 Atherosclerotic heart disease of native coronary artery with other forms of angina pectoris: Secondary | ICD-10-CM | POA: Diagnosis not present

## 2015-09-10 DIAGNOSIS — M6281 Muscle weakness (generalized): Secondary | ICD-10-CM | POA: Diagnosis not present

## 2015-09-10 DIAGNOSIS — R262 Difficulty in walking, not elsewhere classified: Secondary | ICD-10-CM | POA: Diagnosis not present

## 2015-09-10 DIAGNOSIS — Z4682 Encounter for fitting and adjustment of non-vascular catheter: Secondary | ICD-10-CM | POA: Diagnosis not present

## 2015-09-10 DIAGNOSIS — Z5181 Encounter for therapeutic drug level monitoring: Secondary | ICD-10-CM | POA: Diagnosis not present

## 2015-09-10 DIAGNOSIS — E870 Hyperosmolality and hypernatremia: Secondary | ICD-10-CM | POA: Diagnosis not present

## 2015-09-10 DIAGNOSIS — K225 Diverticulum of esophagus, acquired: Secondary | ICD-10-CM | POA: Diagnosis not present

## 2015-09-10 DIAGNOSIS — D649 Anemia, unspecified: Secondary | ICD-10-CM | POA: Diagnosis not present

## 2015-09-10 DIAGNOSIS — I160 Hypertensive urgency: Secondary | ICD-10-CM | POA: Diagnosis not present

## 2015-09-10 DIAGNOSIS — I2510 Atherosclerotic heart disease of native coronary artery without angina pectoris: Secondary | ICD-10-CM | POA: Diagnosis present

## 2015-09-10 DIAGNOSIS — L8990 Pressure ulcer of unspecified site, unspecified stage: Secondary | ICD-10-CM | POA: Diagnosis not present

## 2015-09-10 DIAGNOSIS — H9190 Unspecified hearing loss, unspecified ear: Secondary | ICD-10-CM | POA: Diagnosis present

## 2015-09-10 DIAGNOSIS — Z955 Presence of coronary angioplasty implant and graft: Secondary | ICD-10-CM | POA: Diagnosis not present

## 2015-09-10 DIAGNOSIS — Z9849 Cataract extraction status, unspecified eye: Secondary | ICD-10-CM | POA: Diagnosis not present

## 2015-09-10 DIAGNOSIS — R1314 Dysphagia, pharyngoesophageal phase: Secondary | ICD-10-CM | POA: Diagnosis present

## 2015-09-10 DIAGNOSIS — I209 Angina pectoris, unspecified: Secondary | ICD-10-CM | POA: Diagnosis not present

## 2015-09-10 DIAGNOSIS — I10 Essential (primary) hypertension: Secondary | ICD-10-CM | POA: Diagnosis not present

## 2015-09-10 DIAGNOSIS — R51 Headache: Secondary | ICD-10-CM | POA: Diagnosis present

## 2015-09-10 DIAGNOSIS — G4733 Obstructive sleep apnea (adult) (pediatric): Secondary | ICD-10-CM | POA: Diagnosis present

## 2015-09-10 DIAGNOSIS — I252 Old myocardial infarction: Secondary | ICD-10-CM | POA: Diagnosis not present

## 2015-09-10 DIAGNOSIS — E876 Hypokalemia: Secondary | ICD-10-CM | POA: Diagnosis not present

## 2015-09-10 LAB — BASIC METABOLIC PANEL
Anion gap: 10 (ref 5–15)
BUN: 18 mg/dL (ref 6–20)
CHLORIDE: 105 mmol/L (ref 101–111)
CO2: 26 mmol/L (ref 22–32)
CREATININE: 0.88 mg/dL (ref 0.61–1.24)
Calcium: 9 mg/dL (ref 8.9–10.3)
GFR calc Af Amer: >60 mL/min (ref >60)
GFR calc non Af Amer: >60 mL/min (ref >60)
Glucose, Bld: 164 mg/dL — ABNORMAL HIGH (ref 65–99)
Potassium: 3.8 mmol/L (ref 3.5–5.1)
SODIUM: 141 mmol/L (ref 135–145)

## 2015-09-10 NOTE — Progress Notes (Signed)
[facility] PROGRESS NOTE  Manuel Le [id number] DOB: 1927/01/27 DOA: 09/09/2015 PCP: Wende Neighbors, MD  Assessment/Plan: 1. Hypertensive urgency: -bp parameters improved. Currently on IV hydralazine and IV labetalol.  - plan to resume oral meds once able to take po.  - will plan to get SLP involved in am if still having trouble taking po.    2. Zenkers diverticulum: S/p diverticulectomy.  Further management as per Dr Constance Holster.    Code Status: full code.  Family Communication: none at bedside.  Disposition Plan: home when able to take po.     Procedures:  zenkers diverticulectomy.  Antibiotics:  Clindamycin.   HPI/Subjective: Reports pain is better than yesterday  Objective: Filed Vitals:   09/09/15 2120 09/10/15 0543  BP: 146/65 151/56  Pulse: 88 88  Temp: 98.7 F (37.1 C) 98.9 F (37.2 C)  Resp: 18 19    Intake/Output Summary (Last 24 hours) at 09/10/15 1818 Last data filed at 09/10/15 1811  Gross per 24 hour  Intake 403.33 ml  Output    500 ml  Net -96.67 ml   Filed Weights   09/09/15 0744  Weight: 86.183 kg (190 lb)    Exam:   General:  Sleeping comfortably  Cardiovascular: s1s2  Respiratory: ctab  Abdomen: soft NT NDBS+  Musculoskeletal: TRACE pedal edema.   Data Reviewed: Basic Metabolic Panel:  Recent Labs Lab 09/09/15 0740 09/10/15 1310  NA 141 141  K 4.2 3.8  CL 104 105  CO2 24 26  GLUCOSE 106* 164*  BUN 23* 18  CREATININE 0.79 0.88  CALCIUM 9.4 9.0   Liver Function Tests: No results for input(s): AST, ALT, ALKPHOS, BILITOT, PROT, ALBUMIN in the last 168 hours. No results for input(s): LIPASE, AMYLASE in the last 168 hours. No results for input(s): AMMONIA in the last 168 hours. CBC:  Recent Labs Lab 09/09/15 0740  WBC 7.0  HGB 13.6  HCT 40.1  MCV 94.6  PLT 174   Cardiac Enzymes: No results for input(s): CKTOTAL, CKMB, CKMBINDEX, TROPONINI in the last 168 hours. BNP (last 3 results) No results for  input(s): BNP in the last 8760 hours.  ProBNP (last 3 results) No results for input(s): PROBNP in the last 8760 hours.  CBG: No results for input(s): GLUCAP in the last 168 hours.  No results found for this or any previous visit (from the past 240 hour(s)).   Studies: No results found.  Scheduled Meds:  Continuous Infusions: . dextrose 5 % and 0.9% NaCl 125 mL/hr at 09/10/15 0234    Active Problems:   Zenker's diverticulum    Time spent: 20 minutes.     [facility] Pager [phone] If 7PM-7AM, please contact night-coverage at [email or web address], password [facility] 09/10/2015, 6:18 PM  LOS: 0 days

## 2015-09-10 NOTE — Progress Notes (Signed)
Patient ID: Manuel Le, male   DOB: [birth year], 82 y.o.   MRN: [id number] Subjective: Still having pain and unable to swallow. No breathing difficulty.  Objective: Vital signs in last 24 hours: Temp:  [97.2 F (36.2 C)-98.9 F (37.2 C)] 98.9 F (37.2 C) (01/20 0543) Pulse Rate:  [71-95] 88 (01/20 0543) Resp:  [18-21] 19 (01/20 0543) BP: (146-185)/(56-90) 151/56 mmHg (01/20 0543) SpO2:  [94 %-98 %] 94 % (01/20 0543) Weight change:     Intake/Output from previous day: 01/19 0701 - 01/20 0700 In: 1572.1 [P.O.:220; I.V.:1352.1] Out: 201 [Urine:201] Intake/Output this shift:    PHYSICAL EXAM: Voice clear, no neck swelling or crepitance.  Lab Results:  Recent Labs  09/09/15 0740  WBC 7.0  HGB 13.6  HCT 40.1  PLT 174   BMET  Recent Labs  09/09/15 0740  NA 141  K 4.2  CL 104  CO2 24  GLUCOSE 106*  BUN 23*  CREATININE 0.79  CALCIUM 9.4    Studies/Results: No results found.  Medications: I have reviewed the patient's current medications.  Assessment/Plan: Significant post op pain, likely from trauma to the pharynx. Continue to support with IVF and meds until he is able to swallow.     Manuel Le 09/10/2015, 11:31 AM

## 2015-09-11 MED ORDER — KETOROLAC TROMETHAMINE 30 MG/ML IJ SOLN
15.0000 mg | Freq: Four times a day (QID) | INTRAMUSCULAR | Status: AC | PRN
  Administered 2015-09-11 – 2015-09-12 (×4): 15 mg via INTRAVENOUS
  Filled 2015-09-11 (×4): qty 1

## 2015-09-11 MED ORDER — LABETALOL HCL 5 MG/ML IV SOLN: 5.0000 mg | Freq: Four times a day (QID) | INTRAVENOUS | Status: DC | PRN

## 2015-09-11 NOTE — Progress Notes (Signed)
Patient ID: Manuel Le, male   DOB: [birth year], 82 y.o.   MRN: [id number] Subjective: Still having significant throat pain and inability to swallow.  Objective: Vital signs in last 24 hours: Temp:  [99.2 F (37.3 C)] 99.2 F (37.3 C) (01/21 0435) Pulse Rate:  [79-88] 79 (01/21 0435) Resp:  [19-20] 19 (01/21 0435) BP: (157-164)/(57-66) 164/66 mmHg (01/21 0435) SpO2:  [95 %-96 %] 96 % (01/21 0435) Weight change:     Intake/Output from previous day: 01/20 0701 - 01/21 0700 In: 3429.2 [P.O.:300; I.V.:3129.2] Out: 600 [Urine:600] Intake/Output this shift:    PHYSICAL EXAM: Breathing and voice are normal. No swelling of the neck. I had him try to take a small spoonful of soft oatmeal but he was unable to swallow because of the severe pain. He denies chest pain however.  Lab Results:  Recent Labs  09/09/15 0740  WBC 7.0  HGB 13.6  HCT 40.1  PLT 174   BMET  Recent Labs  09/09/15 0740 09/10/15 1310  NA 141 141  K 4.2 3.8  CL 104 105  CO2 24 26  GLUCOSE 106* 164*  BUN 23* 18  CREATININE 0.79 0.88  CALCIUM 9.4 9.0    Studies/Results: No results found.  Medications: I have reviewed the patient's current medications.  Assessment/Plan: Stable, continues to have severe pain and difficulty swallowing. Recommend we get him ambulating more. Continue analgesia. Eventually, his pain will diminish and he will be able to swallow.  LOS: 1 day   Natalyia Innes 09/11/2015, 10:12 AM

## 2015-09-11 NOTE — Progress Notes (Signed)
[facility] PROGRESS NOTE  OMI TAVELLA [id number] DOB: Sep 29, 1926 DOA: 09/09/2015 PCP: Wende Neighbors, MD  Assessment/Plan: 1. Hypertensive urgency: bp parameters sbp 150 to 160's. . Currently on IV hydralazine and IV labetalol.  - plan to resume oral meds once able to take po.  - will plan to get SLP involved in am if still having trouble taking po.    2. Zenkers diverticulum: S/p diverticulectomy.  Further management as per Dr Constance Holster.    Code Status: full code.  Family Communication: none at bedside.  Disposition Plan: home when able to take po.     Procedures:  zenkers diverticulectomy.  Antibiotics:  Clindamycin.   HPI/Subjective: Reports pain is better.  Objective: Filed Vitals:   09/11/15 1130 09/11/15 1440  BP: 157/58 163/56  Pulse: 74 84  Temp: 99.8 F (37.7 C) 98.6 F (37 C)  Resp: 18 18    Intake/Output Summary (Last 24 hours) at 09/11/15 1926 Last data filed at 09/11/15 1900  Gross per 24 hour  Intake 532.08 ml  Output    750 ml  Net -217.92 ml   Filed Weights   09/09/15 0744  Weight: 86.183 kg (190 lb)    Exam:   General:  Sleeping comfortably  Cardiovascular: s1s2  Respiratory: ctab  Abdomen: soft NT NDBS+  Musculoskeletal: TRACE pedal edema.   Data Reviewed: Basic Metabolic Panel:  Recent Labs Lab 09/09/15 0740 09/10/15 1310  NA 141 141  K 4.2 3.8  CL 104 105  CO2 24 26  GLUCOSE 106* 164*  BUN 23* 18  CREATININE 0.79 0.88  CALCIUM 9.4 9.0   Liver Function Tests: No results for input(s): AST, ALT, ALKPHOS, BILITOT, PROT, ALBUMIN in the last 168 hours. No results for input(s): LIPASE, AMYLASE in the last 168 hours. No results for input(s): AMMONIA in the last 168 hours. CBC:  Recent Labs Lab 09/09/15 0740  WBC 7.0  HGB 13.6  HCT 40.1  MCV 94.6  PLT 174   Cardiac Enzymes: No results for input(s): CKTOTAL, CKMB, CKMBINDEX, TROPONINI in the last 168 hours. BNP (last 3 results) No results for input(s):  BNP in the last 8760 hours.  ProBNP (last 3 results) No results for input(s): PROBNP in the last 8760 hours.  CBG: No results for input(s): GLUCAP in the last 168 hours.  No results found for this or any previous visit (from the past 240 hour(s)).   Studies: No results found.  Scheduled Meds:  Continuous Infusions: . dextrose 5 % and 0.9% NaCl 100 mL (09/11/15 1705)    Active Problems:   Zenker's diverticulum    Time spent: 15 minutes.     [place] [facility] Pager [phone] If 7PM-7AM, please contact night-coverage at [email or web address], password [facility] 09/11/2015, 7:26 PM  LOS: 1 day

## 2015-09-11 NOTE — Progress Notes (Signed)
Pt was found on floor when I answered an IV pump beeping at [facility] in pt room, his glasses were on floor next to him, urinal on floor & urine on floor, VS taken, pt questioned for pain, examined limbs, helped pt to sitting position then into bed.n Laceration to Left eye brow was bleeding, cleansed, measured, gauze dsg applied with ice.  MD notified, no new orders obtained.

## 2015-09-12 ENCOUNTER — Inpatient Hospital Stay (HOSPITAL_COMMUNITY): Payer: Medicare Other

## 2015-09-12 MED ORDER — FREE WATER
30.0000 mL | Freq: Three times a day (TID) | Status: DC
  Administered 2015-09-12: 30 mL

## 2015-09-12 MED ORDER — ENSURE ENLIVE PO LIQD: 1.0000 | Freq: Every day | ORAL | Status: DC

## 2015-09-12 MED ORDER — LIDOCAINE HCL 2 % EX GEL
1.0000 "application " | Freq: Once | CUTANEOUS | Status: DC | PRN
  Filled 2015-09-12: qty 5

## 2015-09-12 MED ORDER — CARVEDILOL 3.125 MG PO TABS
3.1250 mg | ORAL_TABLET | Freq: Two times a day (BID) | ORAL | Status: DC
  Administered 2015-09-13 – 2015-09-15 (×6): 3.125 mg via NASOGASTRIC
  Filled 2015-09-12 (×6): qty 1

## 2015-09-12 MED ORDER — LIDOCAINE HCL 4 % EX SOLN
0.0000 mL | Freq: Once | CUTANEOUS | Status: DC | PRN
  Filled 2015-09-12: qty 50

## 2015-09-12 MED ORDER — SILVER NITRATE-POT NITRATE 75-25 % EX MISC
10.0000 | Freq: Once | CUTANEOUS | Status: DC | PRN
  Filled 2015-09-12: qty 10

## 2015-09-12 MED ORDER — AMLODIPINE BESYLATE 5 MG PO TABS
5.0000 mg | ORAL_TABLET | Freq: Every day | ORAL | Status: DC
  Administered 2015-09-12 – 2015-09-14 (×3): 5 mg via NASOGASTRIC
  Filled 2015-09-12 (×3): qty 1

## 2015-09-12 MED ORDER — TRIPLE ANTIBIOTIC 3.5-400-5000 EX OINT
1.0000 "application " | TOPICAL_OINTMENT | Freq: Once | CUTANEOUS | Status: DC | PRN
  Filled 2015-09-12: qty 1

## 2015-09-12 MED ORDER — ENSURE ENLIVE PO LIQD
1.0000 | ORAL | Status: DC
  Administered 2015-09-12: 237 mL

## 2015-09-12 MED ORDER — OXYMETAZOLINE HCL 0.05 % NA SOLN
1.0000 | Freq: Once | NASAL | Status: DC | PRN
  Filled 2015-09-12: qty 15

## 2015-09-12 NOTE — Progress Notes (Signed)
[facility] PROGRESS NOTE  Manuel Le [id number] DOB: [birth year] DOA: 09/09/2015 PCP: Wende Neighbors, MD Brief History: 82 year old gentleman, with h/o hypertension, dysphagia, presents with difficulty swallowing and regurgitation of the undigested food, barium swallow showed a large zenker's diverticulum.  Underwent diverticulectomy and since then he is not able to take po.  Overnight he had a fall and had a small laceration over his forehead.  Earlier today, a small NG tube placed for temporary feeding till his swallowing improves.  SLP eval earlier today recommended taking sips of water, which he was able to do without any issues.   Assessment/Plan: 1. Hypertensive urgency: bp parameters sbp 150 to 160's. . Currently on IV hydralazine and IV labetalol.  - plan to start him on oral meds tonight, starting with norvasc and coreg through NG tube.    2. Zenkers diverticulum: S/p diverticulectomy.  Further management as per Dr Constance Holster.  Pain control as needed.    3. Fall overnight, he denies any dizziness, some facial headache.  - he reports feeling weak and his legs gave away when he tried to walk.  - PT eval ordered for further evaluation.   Code Status: full code.  Family Communication: none at bedside.  Disposition Plan: home when able to take po.     Procedures:  zenkers diverticulectomy.  Antibiotics:  Clindamycin.   HPI/Subjective: Reports pain is better.  Objective: Filed Vitals:   09/12/15 0557 09/12/15 1333  BP: 117/59 145/58  Pulse: 85 66  Temp: 98.1 F (36.7 C) 97.5 F (36.4 C)  Resp: 18 18    Intake/Output Summary (Last 24 hours) at 09/12/15 1946 Last data filed at 09/12/15 1848  Gross per 24 hour  Intake 651.67 ml  Output    500 ml  Net 151.67 ml   Filed Weights   09/09/15 0744  Weight: 86.183 kg (190 lb)    Exam:   General:  Sleeping comfortably  Cardiovascular: s1s2  Respiratory: ctab  Abdomen: soft NT  NDBS+  Musculoskeletal: TRACE pedal edema.   Data Reviewed: Basic Metabolic Panel:  Recent Labs Lab 09/09/15 0740 09/10/15 1310  NA 141 141  K 4.2 3.8  CL 104 105  CO2 24 26  GLUCOSE 106* 164*  BUN 23* 18  CREATININE 0.79 0.88  CALCIUM 9.4 9.0   Liver Function Tests: No results for input(s): AST, ALT, ALKPHOS, BILITOT, PROT, ALBUMIN in the last 168 hours. No results for input(s): LIPASE, AMYLASE in the last 168 hours. No results for input(s): AMMONIA in the last 168 hours. CBC:  Recent Labs Lab 09/09/15 0740  WBC 7.0  HGB 13.6  HCT 40.1  MCV 94.6  PLT 174   Cardiac Enzymes: No results for input(s): CKTOTAL, CKMB, CKMBINDEX, TROPONINI in the last 168 hours. BNP (last 3 results) No results for input(s): BNP in the last 8760 hours.  ProBNP (last 3 results) No results for input(s): PROBNP in the last 8760 hours.  CBG: No results for input(s): GLUCAP in the last 168 hours.  No results found for this or any previous visit (from the past 240 hour(s)).   Studies: Dg Abd Portable 1v  09/12/2015  CLINICAL DATA:  Verify NG tube placement. EXAM: PORTABLE ABDOMEN - 1 VIEW COMPARISON:  CT of the abdomen and pelvis 01/13/2015 FINDINGS: The bowel gas pattern is normal. No radio-opaque calculi or other significant radiographic abnormality are seen. No evidence of organomegaly. Feeding catheter is seen, tip overlies the expected location of gastric cardia. IMPRESSION: Feeding  catheter likely within the proximal stomach. Nonobstructive bowel gas pattern. Electronically Signed   By: Fidela Salisbury M.D.   On: 09/12/2015 15:57    Scheduled Meds: . free water  30 mL Per Tube 3 times per day   Continuous Infusions: . dextrose 5 % and 0.9% NaCl 100 mL/hr at 09/12/15 1102  . feeding supplement (ENSURE ENLIVE) 237 mL (09/12/15 1912)    Active Problems:   Zenker's diverticulum    Time spent: 15 minutes.     [place] [facility] Pager [phone] If 7PM-7AM,  please contact night-coverage at [email or web address], password [facility] 09/12/2015, 7:46 PM  LOS: 2 days

## 2015-09-12 NOTE — Progress Notes (Addendum)
Patient ID: Manuel Le, male   DOB: 04-01-1927, 82 y.o.   MRN: [id number]  Patient fell last night and suffered a small superficial laceration of the left eyebrow area. There was no loss of consciousness. Ice was applied.  This morning he is feeling about the same as he was yesterday. Still unable to swallow.  Flexible fiberoptic laryngoscopy was performed. The larynx appears normal, there is no pooling of secretions. The cords move well and approximate normally. There is no identifiable abnormality at the esophageal introitus.  A Panda feeding tube was not available, so I attempted a different type of tube but was not successful. It was too flimsy. We will have to try perhaps with radiology tomorrow.  We will initiate tube feeds. Hopefully with better nutrition we can have some improvement of his swallowing status. We may also want to consider temporary placement in a skilled nursing facility.   Addendum:  A proper nasogastric feeding tube was identified and was passed without difficulty. We will check a chest x-ray and then start feeds if it's in the right location.

## 2015-09-12 NOTE — Evaluation (Addendum)
Clinical/Bedside Swallow Evaluation Patient Details  Name: Manuel Le MRN: [id number] Date of Birth: 05/20/1927  Today's Date: 09/12/2015 Time: SLP Start Time (ACUTE ONLY): 0944 SLP Stop Time (ACUTE ONLY): 1000 SLP Time Calculation (min) (ACUTE ONLY): 16 min  Past Medical History:  Past Medical History  Diagnosis Date  . Hypertension   . Coronary artery disease   . Iron deficiency anemia   . Bladder cancer ([place])   . Hematuria   . Atrial fibrillation and flutter ([place])   . Mild obstructive sleep apnea     no cpap  . Exertional angina ([place]) 07/08/2014  . Arthritis     OSTEO  . S/P coronary artery stent placement 07/24/2014  . Dysrhythmia   . Kidney stones     hx of   . H/O urinary frequency   . Weakness    Past Surgical History:  Past Surgical History  Procedure Laterality Date  . Colonoscopy  07/04/2012    Procedure: COLONOSCOPY;  Surgeon: Rogene Houston, MD;  [facility];  Service: Endoscopy;  Laterality: N/A;  320  . Esophagogastroduodenoscopy  07/04/2012    Procedure: ESOPHAGOGASTRODUODENOSCOPY (EGD);  Surgeon: Rogene Houston, MD;  Location: [facility];  Service: Endoscopy;  Laterality: N/A;  . Cardiovascular stress test  12-15-2013  DR Kate Sable ([place])    MILD - MODERATE PERI-INFARCT ISCHEMIA  OF INFERIOR WALL, MID-INFEROSEPTAL WALL, MID-INFEROLATERAL WALL, & BASAL INFERIOR WALL/   NORMAL LVF /  EF 59%/  INTERMEDIATE RISK STUDY  . Transthoracic echocardiogram  09-11-2013    MODERATE LVH/  EF 55-60%/  MILD MR & TR/  MILD TO MODERATE CALCIFIED AV WITHOUT STENOSIS/  MILD LAE/   MODERATE PR  . Total knee arthroplasty Bilateral 2000  &  2003?  Marland Kitchen Hernia repair Right   . Transurethral resection of bladder tumor with gyrus (turbt-gyrus) N/A 12/31/2013    Procedure: TRANSURETHRAL RESECTION OF BLADDER TUMOR WITH GYRUS (TURBT-GYRUS);  Surgeon: Alexis Frock, MD;  Location: [facility];  Service: Urology;  Laterality: N/A;  . Cystoscopy w/  retrogrades Bilateral 12/31/2013    Procedure: CYSTOSCOPY WITH RETROGRADE PYELOGRAM;  Surgeon: Alexis Frock, MD;  Location: [facility];  Service: Urology;  Laterality: Bilateral;  . Coronary stent placement  07/08/2014    PTCA of RCA   DR COOPER  . Left heart catheterization with coronary angiogram N/A 07/08/2014    Procedure: LEFT HEART CATHETERIZATION WITH CORONARY ANGIOGRAM;  Surgeon: Blane Ohara, MD;  Location: [facility] CATH LAB;  Service: Cardiovascular;  Laterality: N/A;  . Percutaneous coronary stent intervention (pci-s)  07/08/2014    Procedure: PERCUTANEOUS CORONARY STENT INTERVENTION (PCI-S);  Surgeon: Blane Ohara, MD;  Location: [facility] CATH LAB;  Service: Cardiovascular;;  . Eye surgery      past cataract surgery bilat  . Transurethral resection of bladder tumor N/A 01/20/2015    Procedure: TRANSURETHRAL RESECTION OF BLADDER TUMOR (TURBT);  Surgeon: Alexis Frock, MD;  Location: [facility];  Service: Urology;  Laterality: N/A;  . Cystoscopy w/ retrogrades Bilateral 01/20/2015    Procedure: CYSTOSCOPY WITH RETROGRADE PYELOGRAM;  Surgeon: Alexis Frock, MD;  Location: [facility];  Service: Urology;  Laterality: Bilateral;  . Cardiac catheterization    . Zenker's diverticulectomy endoscopic  09/09/2015  . Zenker's diverticulectomy N/A 09/09/2015    Procedure: ENDOSCOPIC ZENKER'S DIVERTICULECTOMY;  Surgeon: Izora Gala, MD;  Location: [facility] OR;  Service: ENT;  Laterality: N/A;   HPI:  82 yr old pt with 5-6 month history of difficulty  swallowing and regurgitation of undigested food after eating. No history of heartburn or reflux. Other PMH: HTN, CAD, mild OSA. Per MD note barium esophagram revealeda large Zenker's diverticulum. Pt admitted for surgical repair of Zenker's 1/19 resulting in ability to swallow secretions, odnophagia and ST consult.    Assessment / Plan / Recommendation Clinical Impression  Decreased secretion management with frequent throat clear/cough and expectoration of secretions noted  at baseline. Palpation of swallow with saliva revealed multiple attempts to initiate and appeared effortful with decreased ROM. Immediate throat clear and coughing across consistencies of thin, nectar and puree due to suspected edema leading to probable pharyngeal po retention and larygneal penetration/aspiration. Frequent use of Yankeurs to remove saliva/po mixture. Prognosis for swallow function improves with decreased edema. Recommend ice chips PRN after oral care only and follow up ST.       Aspiration Risk  Severe aspiration risk    Diet Recommendation Ice chips PRN after oral care   Liquid Administration via: Spoon    Other  Recommendations Oral Care Recommendations: Oral care QID   Follow up Recommendations   (TBD)    Frequency and Duration min 2x/week  2 weeks       Prognosis Prognosis for Safe Diet Advancement:  (fair-good)      Swallow Study   General HPI: 82 yr old pt with 5-6 month history of difficulty swallowing and regurgitation of undigested food after eating. No history of heartburn or reflux. Other PMH: HTN, CAD, mild OSA. Per MD note barium esophagram revealeda large Zenker's diverticulum. Pt admitted for surgical repair of Zenker's 1/19 resulting in ability to swallow secretions, odnophagia and ST consult.  Type of Study: Bedside Swallow Evaluation Previous Swallow Assessment:  (none) Diet Prior to this Study: Thin liquids (full liquids) Temperature Spikes Noted: Yes Respiratory Status: Room air History of Recent Intubation: Yes Length of Intubations (days):  (for surgery only) Date extubated: 09/09/15 Behavior/Cognition: Alert;Cooperative;Pleasant mood Oral Cavity Assessment: Within Functional Limits Oral Care Completed by SLP: Yes Oral Cavity - Dentition: Adequate natural dentition Vision: Functional for self-feeding Self-Feeding Abilities: Able to feed self Patient Positioning: Upright in bed Baseline Vocal Quality: Normal Volitional Cough:  Strong Volitional Swallow:  (incomplete, weak )    Oral/Motor/Sensory Function Overall Oral Motor/Sensory Function: Within functional limits   Ice Chips Ice chips: Impaired Presentation: Spoon Oral Phase Impairments:  (none) Oral Phase Functional Implications:  (none) Pharyngeal Phase Impairments: Decreased hyoid-laryngeal movement;Suspected delayed Swallow;Multiple swallows;Throat Clearing - Immediate;Cough - Immediate;Throat Clearing - Delayed   Thin Liquid Thin Liquid: Impaired Presentation: Spoon Oral Phase Impairments:  (none) Oral Phase Functional Implications:  (none) Pharyngeal  Phase Impairments: Suspected delayed Swallow;Decreased hyoid-laryngeal movement;Multiple swallows;Throat Clearing - Immediate;Cough - Immediate;Throat Clearing - Delayed    Nectar Thick Nectar Thick Liquid: Impaired Presentation: Spoon Oral Phase Impairments:  (none) Oral phase functional implications:  (none) Pharyngeal Phase Impairments: Multiple swallows;Decreased hyoid-laryngeal movement;Suspected delayed Swallow;Throat Clearing - Immediate;Throat Clearing - Delayed;Cough - Delayed   Honey Thick Honey Thick Liquid: Not tested   Puree Puree: Impaired Presentation: Spoon Pharyngeal Phase Impairments: Suspected delayed Swallow;Decreased hyoid-laryngeal movement;Multiple swallows;Throat Clearing - Immediate;Cough - Delayed   Solid   GO   Solid: Not tested        Houston Siren 09/12/2015,10:35 AM  Orbie Pyo Colvin Caroli.Ed [employer] [phone]

## 2015-09-13 LAB — GLUCOSE, CAPILLARY: GLUCOSE-CAPILLARY: 145 mg/dL — AB (ref 65–99)

## 2015-09-13 MED ORDER — JEVITY 1.2 CAL PO LIQD
1000.0000 mL | ORAL | Status: DC
  Administered 2015-09-13: 1000 mL
  Filled 2015-09-13 (×7): qty 1000

## 2015-09-13 MED ORDER — CETYLPYRIDINIUM CHLORIDE 0.05 % MT LIQD
7.0000 mL | Freq: Two times a day (BID) | OROMUCOSAL | Status: DC
  Administered 2015-09-14 – 2015-09-15 (×4): 7 mL via OROMUCOSAL

## 2015-09-13 MED ORDER — ATORVASTATIN CALCIUM 20 MG PO TABS
20.0000 mg | ORAL_TABLET | Freq: Every day | ORAL | Status: DC
  Administered 2015-09-13 – 2015-09-15 (×3): 20 mg via ORAL
  Filled 2015-09-13 (×3): qty 1

## 2015-09-13 MED ORDER — FREE WATER
30.0000 mL | Status: DC
  Administered 2015-09-13 – 2015-09-14 (×8): 30 mL

## 2015-09-13 MED ORDER — LOSARTAN POTASSIUM 50 MG PO TABS
100.0000 mg | ORAL_TABLET | Freq: Every day | ORAL | Status: DC
  Administered 2015-09-13 – 2015-09-15 (×3): 100 mg via ORAL
  Filled 2015-09-13 (×3): qty 2

## 2015-09-13 MED ORDER — ASPIRIN 325 MG PO TABS
325.0000 mg | ORAL_TABLET | Freq: Every day | ORAL | Status: DC
  Administered 2015-09-13 – 2015-09-14 (×2): 325 mg via ORAL
  Filled 2015-09-13 (×2): qty 1

## 2015-09-13 MED ORDER — JEVITY 1.2 CAL PO LIQD: 1000.0000 mL | ORAL | Status: DC

## 2015-09-13 MED ORDER — WHITE PETROLATUM GEL
Status: AC
Start: 2015-09-13 — End: 2015-09-13
  Administered 2015-09-13: 06:00:00
  Filled 2015-09-13: qty 1

## 2015-09-13 NOTE — Progress Notes (Signed)
Patient ID: Manuel Le, male   DOB: 23-Apr-1927, 82 y.o.   MRN: [id number]  Tolerating tube feeds well. He is feeling a little bit better today and has been able to swallow a couple of sips of water this morning. No change on exam. Continue tube feeds. Consider discharge when able to swallow sufficient amounts. Consider temporary placement in a skilled facility for him and his wife.

## 2015-09-13 NOTE — Care Management Important Message (Signed)
Important Message  Patient Details  Name: Manuel Le MRN: [id number] Date of Birth: July 14, 1927   Medicare Important Message Given:  Yes    Nathen May 09/13/2015, 4:24 PM

## 2015-09-13 NOTE — Progress Notes (Signed)
[facility] PROGRESS NOTE  Manuel Le [id number] DOB: Jun 02, 1927 DOA: 09/09/2015 PCP: Wende Neighbors, MD Brief History: 82 year old gentleman, with h/o hypertension, dysphagia, presents with difficulty swallowing and regurgitation of the undigested food, barium swallow showed a large zenker's diverticulum.  Underwent diverticulectomy and since then he is not able to take po.  Overnight he had a fall and had a small laceration over his forehead.  On 1/22, a small NG tube placed for temporary feeding till his swallowing improves.  SLP eval earlier today recommended taking sips of water, which he was able to do without any issues.   Assessment/Plan: 1. Hypertensive urgency: bp parameters sbp 150 to 160's. . Currently on IV hydralazine and IV labetalol.  - restart the patient on all his meds.    2. Zenkers diverticulum: S/p diverticulectomy.  Further management as per Dr Constance Holster.  Pain control as needed.    3. Fall  he denies any dizziness,  - he reports feeling weak and his legs gave away when he tried to walk.  - PT eval ordered for further evaluation. Recommending SNF.   Code Status: full code.  Family Communication: none at bedside.  Disposition Plan: home when able to take po.     Procedures:  zenkers diverticulectomy.  Antibiotics:  Clindamycin.   HPI/Subjective: Reports pain is better.   NO NEW COMPLAINTS.  Objective: Filed Vitals:   09/13/15 1117 09/13/15 1326  BP: 150/67 154/72  Pulse: 69 72  Temp: 97.8 F (36.6 C) 97.8 F (36.6 C)  Resp: 18 18    Intake/Output Summary (Last 24 hours) at 09/13/15 1551 Last data filed at 09/13/15 1300  Gross per 24 hour  Intake   1690 ml  Output    400 ml  Net   1290 ml   Filed Weights   09/09/15 0744  Weight: 86.183 kg (190 lb)    Exam:   General:  Alert in bed.   Cardiovascular: s1s2  Respiratory: ctab  Abdomen: soft NT NDBS+  Musculoskeletal: TRACE pedal edema.   Data Reviewed: Basic  Metabolic Panel:  Recent Labs Lab 09/09/15 0740 09/10/15 1310  NA 141 141  K 4.2 3.8  CL 104 105  CO2 24 26  GLUCOSE 106* 164*  BUN 23* 18  CREATININE 0.79 0.88  CALCIUM 9.4 9.0   Liver Function Tests: No results for input(s): AST, ALT, ALKPHOS, BILITOT, PROT, ALBUMIN in the last 168 hours. No results for input(s): LIPASE, AMYLASE in the last 168 hours. No results for input(s): AMMONIA in the last 168 hours. CBC:  Recent Labs Lab 09/09/15 0740  WBC 7.0  HGB 13.6  HCT 40.1  MCV 94.6  PLT 174   Cardiac Enzymes: No results for input(s): CKTOTAL, CKMB, CKMBINDEX, TROPONINI in the last 168 hours. BNP (last 3 results) No results for input(s): BNP in the last 8760 hours.  ProBNP (last 3 results) No results for input(s): PROBNP in the last 8760 hours.  CBG: No results for input(s): GLUCAP in the last 168 hours.  No results found for this or any previous visit (from the past 240 hour(s)).   Studies: Dg Abd Portable 1v  09/12/2015  CLINICAL DATA:  Verify NG tube placement. EXAM: PORTABLE ABDOMEN - 1 VIEW COMPARISON:  CT of the abdomen and pelvis 01/13/2015 FINDINGS: The bowel gas pattern is normal. No radio-opaque calculi or other significant radiographic abnormality are seen. No evidence of organomegaly. Feeding catheter is seen, tip overlies the expected location of gastric cardia. IMPRESSION:  Feeding catheter likely within the proximal stomach. Nonobstructive bowel gas pattern. Electronically Signed   By: Fidela Salisbury M.D.   On: 09/12/2015 15:57    Scheduled Meds: . amLODipine  5 mg Per NG tube Daily  . aspirin  325 mg Oral Daily  . atorvastatin  20 mg Oral q1800  . carvedilol  3.125 mg Per NG tube BID WC  . free water  30 mL Per Tube Q4H  . losartan  100 mg Oral Daily   Continuous Infusions: . dextrose 5 % and 0.9% NaCl 10 mL/hr at 09/13/15 1000  . feeding supplement (JEVITY 1.2 CAL)      Active Problems:   Zenker's diverticulum    Time spent: 15  minutes.     [place] [facility] Pager [phone] If 7PM-7AM, please contact night-coverage at [email or web address], password [facility] 09/13/2015, 3:51 PM  LOS: 3 days

## 2015-09-13 NOTE — Progress Notes (Signed)
Initial Nutrition Assessment  DOCUMENTATION CODES:   Not applicable  INTERVENTION:   Initiate Jevity 1.2 @ 20 ml/hr via NGT and increase by 10 ml every 4 hours to goal rate of 70 ml/hr.   Tube feeding regimen provides 2016 kcal (100% of needs), 93 grams of protein, and 1356 ml of H2O.   If pt transitions to bolus feedings, recommend:  Initiate bolus feedings of 355 ml Jevity 1.5 4 times daily  Recommend 100 ml free water flush before and after each administration.   Tube feeding regimen provides 2130 kcal (100% of needs), 91 grams of protein, and 1079 ml of H2O. (1879 ml total free water including free water flush regimen.  NUTRITION DIAGNOSIS:   Inadequate oral intake related to inability to eat as evidenced by NPO status.  GOAL:   Patient will meet greater than or equal to 90% of their needs  MONITOR:   Diet advancement, Labs, Weight trends, TF tolerance, Skin, I & O's  REASON FOR ASSESSMENT:   Consult Enteral/tube feeding initiation and management  ASSESSMENT:   82 year old gentleman, with h/o hypertension, dysphagia, presents with difficulty swallowing and regurgitation of the undigested food, barium swallow showed a large zenker's diverticulum.   Pt admitted for Zenker's diverticulum.   S/p Procedure( on) 09/08/14: ENDOSCOPIC ZENKER'S DIVERTICULECTOMY (N/A)  Hx obtained from pt at bedside. He reports decreased intake over the past 2-3 months, due to difficulty swallowing. PTA he was consuming a regular diet, however, complained about food "coming right back up" whenever he tried to swallow. Pt underwent BSE on 09/12/15 and 09/13/15; pt remains NPO due to severe aspiration risk.  Pt endorses weight but unable to confirm quantity or time frame for weight loss. Reports UBW of 190#, which reveals weight stability over the past year per documented wt hx.   Nutrition-Focused physical exam completed. Findings are no fat depletion, no muscle depletion, and no edema.   Pt  was started on TF on 09/12/15. NGT was placed and placement was verified by abdominal x-ray; tube placement was confirmed in the proximal stomach. Feedings were initiated by MD; Ensure Enlive infusing via NGT @ 60 ml/hr (which provides approximately 2100 kcal and 120 grams protein, which meets 100% of estimated kcal needs and >100% of estimated protein needs).   Case dicussed with RN.   CSW following for discharge disposition.   Labs reviewed.   Diet Order:   NPO  Skin:  Reviewed, no issues  Last BM:  PTA  Height:   Ht Readings from Last 1 Encounters:  09/09/15 6' (1.829 m)    Weight:   Wt Readings from Last 1 Encounters:  09/09/15 190 lb (86.183 kg)    Ideal Body Weight:  80.9 kg  BMI:  Body mass index is 25.76 kg/(m^2).  Estimated Nutritional Needs:   Kcal:  1900-2100  Protein:  85-100 grams  Fluid:  1.9-2.1 L  EDUCATION NEEDS:   Education needs addressed  Anaja Monts A. Jimmye Norman, RD, LDN, CDE Pager: [phone] After hours Pager: [phone]

## 2015-09-13 NOTE — Evaluation (Signed)
Physical Therapy Evaluation Patient Details Name: DAMARII BEATO MRN: [id number] DOB: May 03, 1927 Today's Date: 09/13/2015   History of Present Illness  82 year old gentleman, with h/o hypertension, dysphagia, presents with difficulty swallowing and regurgitation of the undigested food, barium swallow showed a large zenker's diverticulum. Underwent diverticulectomy and since then he is not able to take po. Has a past medical history of Hypertension; Coronary artery disease; Iron deficiency anemia; Bladder cancer ([place]); Hematuria; Atrial fibrillation and flutter ([place]); Mild obstructive sleep apnea; Exertional angina (HCC) (07/08/2014); Arthritis; S/P coronary artery stent placement (07/24/2014); Dysrhythmia; Kidney stones; H/O urinary frequency; and Weakness.  has pertinent past surgical history that includes Total knee arthroplasty (Bilateral, 2000  &  2003?); Hernia repair (Right); Transurethral resection of bladder tumor with gyrus x2 ; ; Coronary stent placement (07/08/2014); left heart catheterization with coronary angiogram (N/A, 07/08/2014);;and Zenker's diverticulectomy (N/A, 09/09/2015).  Clinical Impression  Pt reported he had only been OOB once since sx. Pt required mod assist to transfer sitting EOB to standing with RW and +2 to manage lines and equipment. Pt ambulated approx 15 feet to recliner. Pt required verbal cues while ambulating to stay within the RW BOS and required mod assist with tactile and verbal cues to go from standing to sit at the recliner. Pt reports his current state is much weaker than his baseline prior to sx.  PT to follow acutely for deficits listed below and to facilitate return to his independent prior level of function.      Follow Up Recommendations SNF    Equipment Recommendations  Rolling walker with 5" wheels    Recommendations for Other Services       Precautions / Restrictions Precautions Precautions: Fall Restrictions Weight Bearing Restrictions: No       Mobility  Bed Mobility Overal bed mobility: Needs Assistance Bed Mobility: Supine to Sit     Supine to sit: Min assist;HOB elevated;+2 for safety/equipment     General bed mobility comments: Pt required verbal cues for hand placement to move supine to sitting EOB in addition to min assit and close monitoring of lines and leads as Pt moved.  Transfers Overall transfer level: Needs assistance Equipment used: Rolling walker (2 wheeled) Transfers: Sit to/from Stand Sit to Stand: Mod assist;+2 physical assistance         General transfer comment: Pt required +2 mod assist for sit to stand. Pt required multiple verbal cues for hand placement on EOB to push up prior to holding onto RW. Pt requires tactile and verbal cueing to transfer stand to sit. Pt prematurely began to sit without reaching for armrest, and required +2 to redirect and manage lowering him into the recliner.  Ambulation/Gait Ambulation/Gait assistance: Min assist;+2 safety/equipment Ambulation Distance (Feet): 15 Feet Assistive device: Rolling walker (2 wheeled) Gait Pattern/deviations: Step-through pattern;Decreased stride length Gait velocity: decreased  Gait velocity interpretation: Below normal speed for age/gender General Gait Details: Pt required verbal cues to walk within the walker in addition to min assist for safety. Pt requires a +2 for safety to move and monitor lines and leads.  Stairs            Wheelchair Mobility    Modified Rankin (Stroke Patients Only)       Balance Overall balance assessment: Needs assistance Sitting-balance support: Feet supported;Single extremity supported Sitting balance-Leahy Scale: Fair     Standing balance support: Bilateral upper extremity supported Standing balance-Leahy Scale: Poor Standing balance comment: Pt required min assist and RW to maintain  standing balance. Pt reported he had not been out of bed since sx and compared to his baseline he feels  weak on his feet.                             Pertinent Vitals/Pain Pain Assessment: No/denies pain    Home Living Family/patient expects to be discharged to:: Skilled nursing facility Living Arrangements: Spouse/significant other                    Prior Function Level of Independence: Independent         Comments: Pt reports driving and being active without using any AD for mobility prior to surgery.     Hand Dominance        Extremity/Trunk Assessment               Lower Extremity Assessment: Generalized weakness         Communication   Communication: No difficulties  Cognition Arousal/Alertness: Awake/alert Behavior During Therapy: WFL for tasks assessed/performed Overall Cognitive Status: Within Functional Limits for tasks assessed                      General Comments      Exercises        Assessment/Plan    PT Assessment Patient needs continued PT services  PT Diagnosis Generalized weakness;Difficulty walking   PT Problem List Decreased strength;Decreased activity tolerance;Decreased balance;Decreased mobility;Decreased knowledge of use of DME;Decreased safety awareness  PT Treatment Interventions DME instruction;Gait training;Functional mobility training;Therapeutic activities;Therapeutic exercise;Balance training;Patient/family education   PT Goals (Current goals can be found in the Care Plan section) Acute Rehab PT Goals Patient Stated Goal: go home  PT Goal Formulation: With patient Time For Goal Achievement: 09/27/15 Potential to Achieve Goals: Good    Frequency Min 2X/week   Barriers to discharge        Co-evaluation               End of Session Equipment Utilized During Treatment: Gait belt Activity Tolerance: Patient limited by fatigue Patient left: in chair;with call bell/phone within reach;with nursing/sitter in room Nurse Communication: Mobility status         Time: [id number] PT  Time Calculation (min) (ACUTE ONLY): 24 min   Charges:  1 mod evaluation, 1 gait        PT G CodesArelia Sneddon, [place] office Arelia Sneddon 09/13/2015, 4:41 PM

## 2015-09-13 NOTE — Progress Notes (Addendum)
Speech Language Pathology Treatment: Dysphagia  Patient Details Name: Manuel Le MRN: [id number] DOB: 1927-01-06 Today's Date: 09/13/2015 Time: [id number] SLP Time Calculation (min) (ACUTE ONLY): 24 min  Assessment / Plan / Recommendation Clinical Impression  Pt appears to have improved secretion management today as compared to initial evaluation. He and his family report improved vocal quality as well, although still not returned to baseline. Administration of single ice chips continue to elicit immediate, wet coughing. Reflexive coughing is strong and productive, with pt orally suctioning what is likely a combination of secretions and melted ice. Attempted postural maneuvers without any decrease in signs of aspiration. Pt had cups of water and Ensure at his bedside table - discussed with pt the risks of aspiration. Recommend that he continue with ice chips only after oral care to utilize swallowing mechanism as he continues to heal.   HPI HPI: 82 yr old pt with 5-6 month history of difficulty swallowing and regurgitation of undigested food after eating. No history of heartburn or reflux. Other PMH: HTN, CAD, mild OSA. Per MD note barium esophagram revealeda large Zenker's diverticulum. Pt admitted for surgical repair of Zenker's 1/19 resulting in ability to swallow secretions, odnophagia and ST consult.       SLP Plan  Continue with current plan of care     Recommendations  Diet recommendations: NPO Medication Administration: Via alternative means             Oral Care Recommendations: Oral care QID;Oral care prior to ice chip/H20 Follow up Recommendations: Skilled Nursing facility Plan: Continue with current plan of care     GO               Germain Osgood, M.A. CCC-SLP [phone]  Germain Osgood 09/13/2015, 10:17 AM

## 2015-09-14 DIAGNOSIS — L8990 Pressure ulcer of unspecified site, unspecified stage: Secondary | ICD-10-CM | POA: Insufficient documentation

## 2015-09-14 LAB — GLUCOSE, CAPILLARY
GLUCOSE-CAPILLARY: 146 mg/dL — AB (ref 65–99)
GLUCOSE-CAPILLARY: 133 mg/dL — AB (ref 65–99)
GLUCOSE-CAPILLARY: 128 mg/dL — AB (ref 65–99)
Glucose-Capillary: 145 mg/dL — ABNORMAL HIGH (ref 65–99)

## 2015-09-14 LAB — BASIC METABOLIC PANEL
ANION GAP: 9 (ref 5–15)
BUN: 23 mg/dL — ABNORMAL HIGH (ref 6–20)
CALCIUM: 8.9 mg/dL (ref 8.9–10.3)
CO2: 26 mmol/L (ref 22–32)
CREATININE: 0.75 mg/dL (ref 0.61–1.24)
Chloride: 111 mmol/L (ref 101–111)
Glucose, Bld: 150 mg/dL — ABNORMAL HIGH (ref 65–99)
Potassium: 3.5 mmol/L (ref 3.5–5.1)
SODIUM: 146 mmol/L — AB (ref 135–145)

## 2015-09-14 MED ORDER — AMLODIPINE BESYLATE 10 MG PO TABS
10.0000 mg | ORAL_TABLET | Freq: Every day | ORAL | Status: DC
  Administered 2015-09-15: 10 mg via NASOGASTRIC
  Filled 2015-09-14: qty 1

## 2015-09-14 MED ORDER — WHITE PETROLATUM GEL
Status: AC
  Administered 2015-09-14: 0.2
  Filled 2015-09-14: qty 1

## 2015-09-14 MED ORDER — FREE WATER
200.0000 mL | Status: DC
  Administered 2015-09-14 – 2015-09-15 (×7): 200 mL

## 2015-09-14 MED ORDER — FREE WATER: 200.0000 mL | Status: DC

## 2015-09-14 MED ORDER — HYDROCODONE-ACETAMINOPHEN 7.5-325 MG/15ML PO SOLN
10.0000 mL | ORAL | Status: DC | PRN
  Administered 2015-09-14 – 2015-09-15 (×6): 15 mL
  Filled 2015-09-14 (×6): qty 15

## 2015-09-14 MED ORDER — ASPIRIN 325 MG PO TABS
325.0000 mg | ORAL_TABLET | Freq: Every day | ORAL | Status: DC
  Administered 2015-09-15: 325 mg via NASOGASTRIC
  Filled 2015-09-14: qty 1

## 2015-09-14 MED ORDER — AMLODIPINE BESYLATE 5 MG PO TABS
5.0000 mg | ORAL_TABLET | Freq: Once | ORAL | Status: AC
  Administered 2015-09-14: 5 mg via NASOGASTRIC
  Filled 2015-09-14: qty 1

## 2015-09-14 NOTE — Progress Notes (Signed)
[facility] PROGRESS NOTE  Manuel Le [id number] DOB: 1927/08/03 DOA: 09/09/2015 PCP: Wende Neighbors, MD Brief History: 82 year old gentleman, with h/o hypertension, dysphagia, presents with difficulty swallowing and regurgitation of the undigested food, barium swallow showed a large zenker's diverticulum.  Underwent diverticulectomy and since then he is not able to take po.  Overnight he had a fall and had a small laceration over his forehead.  On 1/22, a small NG tube placed for temporary feeding till his swallowing improves.  SLP eval earlier  recommended taking sips of water, which he was able to do without any issues.   Assessment/Plan: 1. Hypertensive urgency: bp parameters sbp 150 to 160's. . Currently on IV hydralazine and IV labetalol.  - restarted the patient on all his meds.  - incrased norvasc to 10 mg daily.   2. Zenkers diverticulum: S/p diverticulectomy.  Further management as per Dr Constance Holster.  Pain control as needed.  SLP  Re- eval in am.  Advance diet to clears in am.  NG TUBE placed and feeds started.    3. Fall:   he denies any dizziness,  - he reports feeling weak and his legs gave away when he tried to walk.  - PT eval ordered for further evaluation. Recommending SNF.   4. Hypernatremia: Free water deficit.  Added free water via 242ml every 6 hours via NG TUBE.  rpeat bmp in am.  Code Status: full code.  Family Communication: none at bedside.  Disposition Plan: home when able to take po.     Procedures:  zenkers diverticulectomy.  Antibiotics:  Clindamycin.   HPI/Subjective: Leg cramps. Recommended to get out of bed and walk in the hallway..  Objective: Filed Vitals:   09/14/15 0513 09/14/15 1337  BP: 174/67 153/64  Pulse: 74 66  Temp: 98 F (36.7 C) 98.1 F (36.7 C)  Resp: 18 18    Intake/Output Summary (Last 24 hours) at 09/14/15 1830 Last data filed at 09/14/15 1500  Gross per 24 hour  Intake 2375.67 ml  Output   1450 ml   Net 925.67 ml   Filed Weights   09/09/15 0744  Weight: 86.183 kg (190 lb)    Exam:   General:  Alert in bed.   Cardiovascular: s1s2  Respiratory: ctab  Abdomen: soft NT NDBS+  Musculoskeletal: TRACE pedal edema.   Data Reviewed: Basic Metabolic Panel:  Recent Labs Lab 09/09/15 0740 09/10/15 1310 09/14/15 0708  NA 141 141 146*  K 4.2 3.8 3.5  CL 104 105 111  CO2 24 26 26   GLUCOSE 106* 164* 150*  BUN 23* 18 23*  CREATININE 0.79 0.88 0.75  CALCIUM 9.4 9.0 8.9   Liver Function Tests: No results for input(s): AST, ALT, ALKPHOS, BILITOT, PROT, ALBUMIN in the last 168 hours. No results for input(s): LIPASE, AMYLASE in the last 168 hours. No results for input(s): AMMONIA in the last 168 hours. CBC:  Recent Labs Lab 09/09/15 0740  WBC 7.0  HGB 13.6  HCT 40.1  MCV 94.6  PLT 174   Cardiac Enzymes: No results for input(s): CKTOTAL, CKMB, CKMBINDEX, TROPONINI in the last 168 hours. BNP (last 3 results) No results for input(s): BNP in the last 8760 hours.  ProBNP (last 3 results) No results for input(s): PROBNP in the last 8760 hours.  CBG:  Recent Labs Lab 09/13/15 1826 09/14/15 0727 09/14/15 1254 09/14/15 1656  GLUCAP 145* 146* 133* 128*    No results found for this or any previous visit (  from the past 240 hour(s)).   Studies: No results found.  Scheduled Meds: . amLODipine  10 mg Per NG tube Daily  . antiseptic oral rinse  7 mL Mouth Rinse BID  . [START ON 09/15/2015] aspirin  325 mg Per NG tube Daily  . atorvastatin  20 mg Oral q1800  . carvedilol  3.125 mg Per NG tube BID WC  . free water  200 mL Per Tube Q4H  . losartan  100 mg Oral Daily   Continuous Infusions: . dextrose 5 % and 0.9% NaCl 10 mL/hr at 09/13/15 1000  . feeding supplement (JEVITY 1.2 CAL) 1,000 mL (09/13/15 1738)    Active Problems:   Zenker's diverticulum   Pressure ulcer    Time spent: 15 minutes.     [facility] Pager [phone] If  7PM-7AM, please contact night-coverage at [email or web address], password [facility] 09/14/2015, 6:30 PM  LOS: 4 days

## 2015-09-14 NOTE — Progress Notes (Signed)
Patient ID: Manuel Le, male   DOB: 01-16-1927, 82 y.o.   MRN: [id number]  Subjective: He feels better than yesterday, less pain and feels that he can swallow a little better.  Objective: Vital signs in last 24 hours: Temp:  [97.5 F (36.4 C)-98 F (36.7 C)] 98 F (36.7 C) (01/24 0513) Pulse Rate:  [69-80] 74 (01/24 0513) Resp:  [17-18] 18 (01/24 0513) BP: (150-177)/(67-80) 174/67 mmHg (01/24 0513) SpO2:  [95 %-96 %] 96 % (01/24 0513) Weight change:     Intake/Output from previous day: 01/23 0701 - 01/24 0700 In: 1465.7 [I.V.:600; NG/GT:865.7] Out: 700 [Urine:700] Intake/Output this shift: Total I/O In: 0  Out: 500 [Urine:500]  PHYSICAL EXAM: Awake and alert. Breathing well. I had him take a sip of soda and he clearly aspirated a little but also felt that he was able to get some down better than he has for quite some time.  Lab Results: No results for input(s): WBC, HGB, HCT, PLT in the last 72 hours. BMET  Recent Labs  09/14/15 0708  NA 146*  K 3.5  CL 111  CO2 26  GLUCOSE 150*  BUN 23*  CREATININE 0.75  CALCIUM 8.9    Studies/Results: Dg Abd Portable 1v  09/12/2015  CLINICAL DATA:  Verify NG tube placement. EXAM: PORTABLE ABDOMEN - 1 VIEW COMPARISON:  CT of the abdomen and pelvis 01/13/2015 FINDINGS: The bowel gas pattern is normal. No radio-opaque calculi or other significant radiographic abnormality are seen. No evidence of organomegaly. Feeding catheter is seen, tip overlies the expected location of gastric cardia. IMPRESSION: Feeding catheter likely within the proximal stomach. Nonobstructive bowel gas pattern. Electronically Signed   By: Fidela Salisbury M.D.   On: 09/12/2015 15:57    Medications: I have reviewed the patient's current medications.  Assessment/Plan: Slowly improving. Continue tube feeds. Appreciate input from SLP and [place]. Will look into SNF placement.   LOS: 4 days   Carleton Vanvalkenburgh 09/14/2015, 11:02 AM

## 2015-09-14 NOTE — Progress Notes (Signed)
Speech Language Pathology Treatment: Dysphagia  Patient Details Name: Manuel Le MRN: [id number] DOB: 01/07/1927 Today's Date: 09/14/2015 Time: [id number] SLP Time Calculation (min) (ACUTE ONLY): 30 min  Assessment / Plan / Recommendation Clinical Impression  Pt's swallow remains grossly unchanged, with multiple swallows, wet vocal quality, and strong reflexive coughing concerning for poor pharyngeal clearance and penetration/aspiration. Small amount of puree offered today to assess thicker consistencies, but this only delayed cough response with ultimate oral expectoration. He has a difficult time adequately turning his head or completing a chin tuck to successfully attempt postural maneuvers. Given his poor performance across all consistencies, he does not seem appropriate for an objective swallowing test; however, given time post-procedure, he will need MBS or FEES in the near future if swallowing does not improve as initially hoped for with decreased edema. Will plan for objective testing in the next day or so if no progress is seen at bedside.   HPI HPI: 82 yr old pt with 5-6 month history of difficulty swallowing and regurgitation of undigested food after eating. No history of heartburn or reflux. Other PMH: HTN, CAD, mild OSA. Per MD note barium esophagram revealeda large Zenker's diverticulum. Pt admitted for surgical repair of Zenker's 1/19 resulting in ability to swallow secretions, odnophagia and ST consult.       SLP Plan  Continue with current plan of care     Recommendations  Diet recommendations: NPO Medication Administration: Via alternative means             Oral Care Recommendations: Oral care QID;Oral care prior to ice chip/H20 Follow up Recommendations: Skilled Nursing facility Plan: Continue with current plan of care     GO               Germain Osgood, M.A. CCC-SLP [phone]  Germain Osgood 09/14/2015, 9:47 AM

## 2015-09-15 ENCOUNTER — Inpatient Hospital Stay (HOSPITAL_COMMUNITY): Payer: Medicare Other

## 2015-09-15 DIAGNOSIS — K225 Diverticulum of esophagus, acquired: Principal | ICD-10-CM

## 2015-09-15 DIAGNOSIS — I10 Essential (primary) hypertension: Secondary | ICD-10-CM

## 2015-09-15 LAB — BASIC METABOLIC PANEL
Anion gap: 15 (ref 5–15)
BUN: 16 mg/dL (ref 6–20)
CALCIUM: 8.2 mg/dL — AB (ref 8.9–10.3)
CO2: 26 mmol/L (ref 22–32)
CREATININE: 0.63 mg/dL (ref 0.61–1.24)
Chloride: 100 mmol/L — ABNORMAL LOW (ref 101–111)
GFR calc Af Amer: >60 mL/min (ref >60)
GFR calc non Af Amer: >60 mL/min (ref >60)
GLUCOSE: 135 mg/dL — AB (ref 65–99)
Potassium: 3.4 mmol/L — ABNORMAL LOW (ref 3.5–5.1)
Sodium: 141 mmol/L (ref 135–145)

## 2015-09-15 LAB — GLUCOSE, CAPILLARY
GLUCOSE-CAPILLARY: 110 mg/dL — AB (ref 65–99)
Glucose-Capillary: 137 mg/dL — ABNORMAL HIGH (ref 65–99)
Glucose-Capillary: 91 mg/dL (ref 65–99)

## 2015-09-15 MED ORDER — CHLORHEXIDINE GLUCONATE 0.12 % MT SOLN
15.0000 mL | Freq: Two times a day (BID) | OROMUCOSAL | Status: DC
  Administered 2015-09-15: 15 mL via OROMUCOSAL
  Filled 2015-09-15: qty 15

## 2015-09-15 MED ORDER — POTASSIUM CHLORIDE CRYS ER 20 MEQ PO TBCR
20.0000 meq | EXTENDED_RELEASE_TABLET | Freq: Once | ORAL | Status: AC
  Administered 2015-09-15: 20 meq via ORAL
  Filled 2015-09-15: qty 1

## 2015-09-15 MED ORDER — ATORVASTATIN CALCIUM 20 MG PO TABS: 20.0000 mg | ORAL_TABLET | Freq: Every day | ORAL | Status: DC

## 2015-09-15 MED ORDER — CETYLPYRIDINIUM CHLORIDE 0.05 % MT LIQD: 7.0000 mL | Freq: Two times a day (BID) | OROMUCOSAL | Status: DC

## 2015-09-15 MED ORDER — MORPHINE SULFATE (PF) 2 MG/ML IV SOLN: 1.0000 mg | Freq: Four times a day (QID) | INTRAVENOUS | Status: DC | PRN

## 2015-09-15 MED ORDER — AMLODIPINE BESYLATE 10 MG PO TABS: 10.0000 mg | ORAL_TABLET | Freq: Every day | ORAL | Status: DC

## 2015-09-15 MED ORDER — PROMETHAZINE HCL 25 MG RE SUPP: 25.0000 mg | Freq: Four times a day (QID) | RECTAL | Status: DC | PRN

## 2015-09-15 MED ORDER — PANCRELIPASE (LIP-PROT-AMYL) 12000-38000 UNITS PO CPEP
2.0000 | ORAL_CAPSULE | Freq: Once | ORAL | Status: AC
  Administered 2015-09-15: 24000 [IU] via ORAL
  Filled 2015-09-15: qty 2

## 2015-09-15 MED ORDER — SODIUM BICARBONATE 650 MG PO TABS
650.0000 mg | ORAL_TABLET | Freq: Once | ORAL | Status: AC
  Administered 2015-09-15: 650 mg via ORAL
  Filled 2015-09-15: qty 1

## 2015-09-15 MED ORDER — VITAMIN B-12 1000 MCG PO TABS: 1000.0000 ug | ORAL_TABLET | Freq: Every day | ORAL | Status: DC

## 2015-09-15 MED ORDER — VITAMIN D-3 25 MCG (1000 UT) PO CAPS: 1.0000 | ORAL_CAPSULE | Freq: Every day | ORAL | Status: DC

## 2015-09-15 MED ORDER — CARVEDILOL 3.125 MG PO TABS: 3.1250 mg | ORAL_TABLET | Freq: Two times a day (BID) | ORAL | Status: DC

## 2015-09-15 MED ORDER — HYDROCODONE-ACETAMINOPHEN 7.5-325 MG/15ML PO SOLN: 10.0000 mL | ORAL | Status: DC | PRN

## 2015-09-15 MED ORDER — JEVITY 1.2 CAL PO LIQD: 1000.0000 mL | ORAL | Status: DC

## 2015-09-15 MED ORDER — ASPIRIN 325 MG PO TABS: 325.0000 mg | ORAL_TABLET | Freq: Every day | ORAL | Status: DC

## 2015-09-15 MED ORDER — LOSARTAN POTASSIUM 100 MG PO TABS: 100.0000 mg | ORAL_TABLET | Freq: Every day | ORAL | Status: DC

## 2015-09-15 NOTE — Progress Notes (Signed)
MBSS complete. Full report located under chart review in imaging section.  Tilton Marsalis Paiewonsky, M.A. CCC-SLP [phone]  

## 2015-09-15 NOTE — Clinical Social Work Note (Signed)
Patient has a bed at [facility] and [facility]. Facility did not have male bed available for today. Patient understanding of limited options for placement due to [facility] tube. Patient will have another swallow evaluation after swelling decreases.   CSW asked permission to contact nephew listed on board and patient stated he would have conversation with family member. Patient pleasant, polite and agreeable to SNF placement at [facility] and [facility]. Patient understanding of medical interventions. No further concerns reported at this time.   RNCM and Medical Director informed.   CSW will continue to follow pt and pt's family for continued support and to facilitate discharge needs once medically stable.   Glendon Axe, MSW, LCSWA [phone] 09/15/2015 4:57 PM

## 2015-09-15 NOTE — Progress Notes (Signed)
Speech Language Pathology Treatment: Dysphagia  Patient Details Name: Manuel Le MRN: [id number] DOB: [birth year] Today's Date: 09/15/2015 Time: [id number] SLP Time Calculation (min) (ACUTE ONLY): 28 min  Assessment / Plan / Recommendation Clinical Impression  Pt believes his swallowing is still unchanged. He is observed coughing and orally expectorating his secretions upon SLP entry. Ice chip trials elicit the same signs of poor pharyngeal clearance and likely penetration and/or aspiration. He appears to cough most of the bolus up and out of his mouth, although persistent wet vocal quality is concerning for penetrates remaining in his upper airway and/or poor secretion management. Given time post-procedure, recommend to proceed with objective testing to better assess oropharyngeal function. ENT has already performed a flexible endoscopy, therefore will proceed with less invasive testing with MBS. Pt and family are in agreement with plan.    HPI HPI: 82 yr old pt with 5-6 month history of difficulty swallowing and regurgitation of undigested food after eating. No history of heartburn or reflux. Other PMH: HTN, CAD, mild OSA. Per MD note barium esophagram revealeda large Zenker's diverticulum. Pt admitted for surgical repair of Zenker's 1/19 resulting in ability to swallow secretions, odnophagia and ST consult.       SLP Plan  MBS     Recommendations  Diet recommendations: NPO Medication Administration: Via alternative means             Oral Care Recommendations: Oral care QID;Oral care prior to ice chip/H20 Follow up Recommendations: Skilled Nursing facility Plan: MBS     GO               Germain Osgood, M.A. CCC-SLP [phone]  Germain Osgood 09/15/2015, 9:39 AM

## 2015-09-15 NOTE — Clinical Social Work Placement (Signed)
   CLINICAL SOCIAL WORK PLACEMENT  NOTE  Date:  09/15/2015  Patient Details  Name: Manuel Le MRN: [id number] Date of Birth: [birth year]  Clinical Social Work is seeking post-discharge placement for this patient at the [place] level of care (*CSW will initial, date and re-position this form in  chart as items are completed):  Yes   Patient/family provided with [place] Work Department's list of facilities offering this level of care within the geographic area requested by the patient (or if unable, by the patient's family).  Yes   Patient/family informed of their freedom to choose among providers that offer the needed level of care, that participate in Medicare, Medicaid or managed care program needed by the patient, have an available bed and are willing to accept the patient.  Yes   Patient/family informed of [facility]'s ownership interest in [facility] and [facility], as well as of the fact that they are under no obligation to receive care at these facilities.  PASRR submitted to EDS on 09/15/15     PASRR number received on 09/15/15     Existing PASRR number confirmed on       FL2 transmitted to all facilities in geographic area requested by pt/family on 09/15/15     FL2 transmitted to all facilities within larger geographic area on 09/15/15     Patient informed that his/her managed care company has contracts with or will negotiate with certain facilities, including the following:        Yes   Patient/family informed of bed offers received.  Patient chooses bed at  ([facility] )     Physician recommends and patient chooses bed at      Patient to be transferred to  ([place] ) on  .  Patient to be transferred to facility by       Patient family notified on   of transfer.  Name of family member notified:        PHYSICIAN Please sign FL2     Additional Comment:     _______________________________________________ Rozell Searing, LCSW 09/15/2015, 6:15 PM

## 2015-09-15 NOTE — Progress Notes (Signed)
Protocol for unclogging tube feeding started, unsuccessful, called Dr. Wilburn Cornelia to inform with orders to increase IVF D5NS at 52ml/hr and give morphine 1mg  every 8hrs prn for pain.

## 2015-09-15 NOTE — Clinical Social Work Note (Signed)
[facility] unable to accommodate panda tube due to cost. Patient aware. Clinical Social Worker has attempted to contact patient's wife however unable to leave voice message.   CSW awaiting returned phone call from [facility] in regards to panda tube.    CSW remains available as needed.   Glendon Axe, MSW, LCSWA [phone] 09/15/2015 3:39 PM

## 2015-09-15 NOTE — Clinical Social Work Note (Signed)
[place] MUST PASARR obtained: [id number] Rozell Searing, MSW, LCSWA [phone] 09/15/2015 6:14 PM

## 2015-09-15 NOTE — Progress Notes (Signed)
[facility] PROGRESS NOTE  SHERROD HATCHELL [id number] DOB: November 14, 1926 DOA: 09/09/2015 PCP: Wende Neighbors, MD  Assessment/Plan: 1. Hypertensive urgency-resolved, blood pressure is controlled, continue Coreg 3.125 mg twice a day, amlodipine 10 mg daily, losartan 100 mg daily. Agree with stopping HCTZ as it can contribute to dizziness. 2. Zenker's diverticulum-status post diverticulectomy. ENT following. 3. Dizziness- patient to go to skilled nursing facility, HCTZ is discontinued. 4. Hypernatremia- sodium improved 141. 5. Hypokalemia- potassium is 3.4, will give 1 dose of K Dur 20 meq by mouth 1      HPI/Subjective: 82 year old gentleman, with h/o hypertension, dysphagia, presents with difficulty swallowing and regurgitation of the undigested food, barium swallow showed a large zenker's diverticulum. Underwent diverticulectomy and since then he is not able to take po.  Overnight he had a fall and had a small laceration over his forehead.  On 1/22, a small NG tube placed for temporary feeding till his swallowing improves.  SLP eval earlier recommended taking sips of water, which he was able to do without any issues.   Patient blood pressure was better after starting home medications.  Objective: Filed Vitals:   09/15/15 1354 09/15/15 1414  BP: 138/50 149/59  Pulse: 65 64  Temp: 98.1 F (36.7 C) 98 F (36.7 C)  Resp: 18 16    Intake/Output Summary (Last 24 hours) at 09/15/15 1619 Last data filed at 09/15/15 1434  Gross per 24 hour  Intake   1570 ml  Output   1250 ml  Net    320 ml   Filed Weights   09/09/15 0744 09/14/15 1922 09/15/15 0541  Weight: 86.183 kg (190 lb) 94.257 kg (207 lb 12.8 oz) 93.668 kg (206 lb 8 oz)    Exam:   General:  Appears in no acute distress  Cardiovascular: S1-S2 regular  Respiratory: Clear to auscultation bilaterally  Abdomen: Soft, nontender, no organomegaly  Musculoskeletal: No cyanosis/clubbing/edema. Extremities   Data  Reviewed: Basic Metabolic Panel:  Recent Labs Lab 09/09/15 0740 09/10/15 1310 09/14/15 0708 09/15/15 0400  NA 141 141 146* 141  K 4.2 3.8 3.5 3.4*  CL 104 105 111 100*  CO2 24 26 26 26   GLUCOSE 106* 164* 150* 135*  BUN 23* 18 23* 16  CREATININE 0.79 0.88 0.75 0.63  CALCIUM 9.4 9.0 8.9 8.2*  CBC:  Recent Labs Lab 09/09/15 0740  WBC 7.0  HGB 13.6  HCT 40.1  MCV 94.6  PLT 174   CBG:  Recent Labs Lab 09/14/15 1254 09/14/15 1656 09/14/15 2144 09/15/15 0746 09/15/15 1223  GLUCAP 133* 128* 145* 110* 137*    No results found for this or any previous visit (from the past 240 hour(s)).   Studies: Dg Swallowing Func-speech Pathology  09/15/2015  Objective Swallowing Evaluation: Type of Study: MBS-Modified Barium Swallow Study Patient Details Name: AJEET AVALON MRN: [id number] Date of Birth: Mar 18, 1927 Today's Date: 09/15/2015 Time: SLP Start Time (ACUTE ONLY): 1056-SLP Stop Time (ACUTE ONLY): 1112 SLP Time Calculation (min) (ACUTE ONLY): 16 min Past Medical History: Past Medical History Diagnosis Date . Hypertension  . Coronary artery disease  . Iron deficiency anemia  . Bladder cancer ([place])  . Hematuria  . Atrial fibrillation and flutter ([place])  . Mild obstructive sleep apnea    no cpap . Exertional angina ([place]) 07/08/2014 . Arthritis    OSTEO . S/P coronary artery stent placement 07/24/2014 . Dysrhythmia  . Kidney stones    hx of  . H/O urinary frequency  . Weakness  Past Surgical History: Past Surgical History Procedure Laterality Date . Colonoscopy  07/04/2012   Procedure: COLONOSCOPY;  Surgeon: Rogene Houston, MD;  [facility];  Service: Endoscopy;  Laterality: N/A;  320 . Esophagogastroduodenoscopy  07/04/2012   Procedure: ESOPHAGOGASTRODUODENOSCOPY (EGD);  Surgeon: Rogene Houston, MD;  [facility];  Service: Endoscopy;  Laterality: N/A; . Cardiovascular stress test  12-15-2013  DR Kate Sable ([place])   MILD - MODERATE PERI-INFARCT  ISCHEMIA  OF INFERIOR WALL, MID-INFEROSEPTAL WALL, MID-INFEROLATERAL WALL, & BASAL INFERIOR WALL/   NORMAL LVF /  EF 59%/  INTERMEDIATE RISK STUDY . Transthoracic echocardiogram  09-11-2013   MODERATE LVH/  EF 55-60%/  MILD MR & TR/  MILD TO MODERATE CALCIFIED AV WITHOUT STENOSIS/  MILD LAE/   MODERATE PR . Total knee arthroplasty Bilateral 2000  &  2003? Marland Kitchen Hernia repair Right  . Transurethral resection of bladder tumor with gyrus (turbt-gyrus) N/A 12/31/2013   Procedure: TRANSURETHRAL RESECTION OF BLADDER TUMOR WITH GYRUS (TURBT-GYRUS);  Surgeon: Alexis Frock, MD;  Location: [facility];  Service: Urology;  Laterality: N/A; . Cystoscopy w/ retrogrades Bilateral 12/31/2013   Procedure: CYSTOSCOPY WITH RETROGRADE PYELOGRAM;  Surgeon: Alexis Frock, MD;  Location: [facility];  Service: Urology;  Laterality: Bilateral; . Coronary stent placement  07/08/2014   PTCA of RCA   DR COOPER . Left heart catheterization with coronary angiogram N/A 07/08/2014   Procedure: LEFT HEART CATHETERIZATION WITH CORONARY ANGIOGRAM;  Surgeon: Blane Ohara, MD;  Location: [facility] CATH LAB;  Service: Cardiovascular;  Laterality: N/A; . Percutaneous coronary stent intervention (pci-s)  07/08/2014   Procedure: PERCUTANEOUS CORONARY STENT INTERVENTION (PCI-S);  Surgeon: Blane Ohara, MD;  Location: [facility] CATH LAB;  Service: Cardiovascular;; . Eye surgery     past cataract surgery bilat . Transurethral resection of bladder tumor N/A 01/20/2015   Procedure: TRANSURETHRAL RESECTION OF BLADDER TUMOR (TURBT);  Surgeon: Alexis Frock, MD;  Location[facility];  Service: Urology;  Laterality: N/A; . Cystoscopy w/ retrogrades Bilateral 01/20/2015   Procedure: CYSTOSCOPY WITH RETROGRADE PYELOGRAM;  Surgeon: Alexis Frock, MD;  Location: [facility];  Service: Urology;  Laterality: Bilateral; . Cardiac catheterization   . Zenker's diverticulectomy endoscopic  09/09/2015 . Zenker's diverticulectomy N/A 09/09/2015   Procedure: ENDOSCOPIC ZENKER'S DIVERTICULECTOMY;  Surgeon:  Izora Gala, MD;  Location: [facility] OR;  Service: ENT;  Laterality: N/A; HPI: 82 yr old pt with 5-6 month history of difficulty swallowing and regurgitation of undigested food after eating. No history of heartburn or reflux. Other PMH: HTN, CAD, mild OSA. Per MD note barium esophagram revealeda large Zenker's diverticulum. Pt admitted for surgical repair of Zenker's 1/19 resulting in ability to swallow secretions, odnophagia and ST consult.  Subjective: pt wants a steak Assessment / Plan / Recommendation CHL IP CLINICAL IMPRESSIONS 09/15/2015 Therapy Diagnosis Suspected primary esophageal dysphagia Clinical Impression Pt has a severe dysphagia due to backflow of all consistencies from the esophagus to the UES, pharynx, and even larynx. Penetration and aspiration occur after the swallow both from backflow of material as well as residue that remains in the pharynx, as boluses cannot enter further into the esophagus. Chin tuck attempted to facilitate clearance into the UES without improvements. Recommend to remain NPO except for a few ice chips after oral care. MD may wish to consider further assessment of the esophagus. Impact on safety and function Severe aspiration risk   CHL IP TREATMENT RECOMMENDATION 09/15/2015 Treatment Recommendations Therapy as outlined in treatment plan below  Prognosis 09/15/2015 Prognosis for Safe Diet Advancement Guarded Barriers to Reach Goals Severity of deficits;Other (Comment) Barriers/Prognosis Comment -- CHL IP DIET RECOMMENDATION 09/15/2015 SLP Diet Recommendations NPO;Ice chips PRN after oral care Liquid Administration via -- Medication Administration Via alternative means Compensations -- Postural Changes --   CHL IP OTHER RECOMMENDATIONS 09/15/2015 Recommended Consults Consider GI evaluation Oral Care Recommendations Oral care QID;Other (Comment) Other Recommendations --   CHL IP FOLLOW UP RECOMMENDATIONS 09/15/2015 Follow up Recommendations Skilled Nursing facility   [facility] IP FREQUENCY AND  DURATION 09/15/2015 Speech Therapy Frequency (ACUTE ONLY) min 2x/week Treatment Duration 2 weeks      CHL IP ORAL PHASE 09/15/2015 Oral Phase WFL Oral - Pudding Teaspoon -- Oral - Pudding Cup -- Oral - Honey Teaspoon -- Oral - Honey Cup -- Oral - Nectar Teaspoon -- Oral - Nectar Cup -- Oral - Nectar Straw -- Oral - Thin Teaspoon -- Oral - Thin Cup -- Oral - Thin Straw -- Oral - Puree -- Oral - Mech Soft -- Oral - Regular -- Oral - Multi-Consistency -- Oral - Pill -- Oral Phase - Comment --  CHL IP PHARYNGEAL PHASE 09/15/2015 Pharyngeal Phase Impaired Pharyngeal- Pudding Teaspoon -- Pharyngeal -- Pharyngeal- Pudding Cup -- Pharyngeal -- Pharyngeal- Honey Teaspoon Delayed swallow initiation-vallecula;Pharyngeal residue - cp segment;Pharyngeal residue - posterior pharnyx;Penetration/Apiration after swallow;Significant aspiration (Amount);Compensatory strategies attempted (with notebox) Pharyngeal Material enters airway, passes BELOW cords and not ejected out despite cough attempt by patient Pharyngeal- Honey Cup -- Pharyngeal -- Pharyngeal- Nectar Teaspoon Delayed swallow initiation-vallecula;Pharyngeal residue - cp segment;Pharyngeal residue - posterior pharnyx;Penetration/Apiration after swallow Pharyngeal Material enters airway, CONTACTS cords and then ejected out Pharyngeal- Nectar Cup -- Pharyngeal -- Pharyngeal- Nectar Straw -- Pharyngeal -- Pharyngeal- Thin Teaspoon -- Pharyngeal -- Pharyngeal- Thin Cup -- Pharyngeal -- Pharyngeal- Thin Straw -- Pharyngeal -- Pharyngeal- Puree Delayed swallow initiation-vallecula;Pharyngeal residue - cp segment;Pharyngeal residue - posterior pharnyx;Penetration/Apiration after swallow;Significant aspiration (Amount) Pharyngeal Material enters airway, remains ABOVE vocal cords then ejected out Pharyngeal- Mechanical Soft -- Pharyngeal -- Pharyngeal- Regular -- Pharyngeal -- Pharyngeal- Multi-consistency -- Pharyngeal -- Pharyngeal- Pill -- Pharyngeal -- Pharyngeal Comment --  CHL IP  CERVICAL ESOPHAGEAL PHASE 09/15/2015 Cervical Esophageal Phase Impaired Pudding Teaspoon -- Pudding Cup -- Honey Teaspoon Esophageal backflow into the larynx;Esophageal backflow into the pharynx;Esophageal backflow into cervical esophagus Honey Cup -- Nectar Teaspoon Esophageal backflow into the larynx;Esophageal backflow into the pharynx;Esophageal backflow into cervical esophagus Nectar Cup -- Nectar Straw -- Thin Teaspoon -- Thin Cup -- Thin Straw -- Puree Esophageal backflow into the pharynx;Esophageal backflow into cervical esophagus;Esophageal backflow into the larynx Mechanical Soft -- Regular -- Multi-consistency -- Pill -- Cervical Esophageal Comment -- No flowsheet data found. Germain Osgood, M.A. CCC-SLP [phone] Germain Osgood 09/15/2015, 12:22 PM               Scheduled Meds: . amLODipine  10 mg Per NG tube Daily  . antiseptic oral rinse  7 mL Mouth Rinse BID  . aspirin  325 mg Per NG tube Daily  . atorvastatin  20 mg Oral q1800  . carvedilol  3.125 mg Per NG tube BID WC  . chlorhexidine  15 mL Mouth Rinse BID  . free water  200 mL Per Tube Q4H  . losartan  100 mg Oral Daily   Continuous Infusions: . dextrose 5 % and 0.9% NaCl 10 mL/hr at 09/13/15 1000  . feeding supplement (JEVITY 1.2 CAL) 1,000 mL (09/15/15 0600)    Active Problems:   HTN (hypertension)  Zenker's diverticulum   Pressure ulcer    Time spent: 25 min    [facility] Pager [phone]. If 7PM-7AM, please contact night-coverage at [email or web address], password [facility] 09/15/2015, 4:19 PM  LOS: 5 days

## 2015-09-15 NOTE — Discharge Instructions (Signed)
Remain NPO - use only feeding tube.

## 2015-09-15 NOTE — Discharge Summary (Signed)
Physician Discharge Summary  Patient ID: Manuel Le MRN: [id number] DOB/AGE: 1927-01-27 82 y.o.  Admit date: 09/09/2015 Discharge date: 09/15/2015  Admission Diagnoses:Zenker's diverticulum  Discharge Diagnoses:  Active Problems:   HTN (hypertension)   Zenker's diverticulum   Pressure ulcer   Discharged Condition: fair  Hospital Course: Complication of severe throat pain and dysphagia, treated with NG feeding tube.  Consults: [facility], Speech and Language Pathology  Significant Diagnostic Studies: none  Treatments: surgery: Endoscopic Zenker's Diverticulectomy  Discharge Exam: Blood pressure 147/68, pulse 65, temperature 97.5 F (36.4 C), temperature source Oral, resp. rate 18, height 6' (1.829 m), weight 93.668 kg (206 lb 8 oz), SpO2 94 %. PHYSICAL EXAM: Awake and alert, breathing well. Voice clear. Able to swallow small amounts but with obvious aspiration and coughing. No neck swelling.  Disposition: [place]   Discharge Instructions    Increase activity slowly    Complete by:  As directed             Medication List    STOP taking these medications        acetaminophen 500 MG tablet  Commonly known as:  TYLENOL     aspirin EC 325 MG tablet  Replaced by:  aspirin 325 MG tablet     cetirizine 10 MG tablet  Commonly known as:  ZYRTEC     losartan-hydrochlorothiazide 100-25 MG tablet  Commonly known as:  HYZAAR      TAKE these medications        amLODipine 10 MG tablet  Commonly known as:  NORVASC  1 tablet (10 mg total) by Per NG tube route daily.     aspirin 325 MG tablet  1 tablet (325 mg total) by Per NG tube route daily.     atorvastatin 20 MG tablet  Commonly known as:  LIPITOR  1 tablet (20 mg total) by Per NG tube route daily at 6 PM.     carvedilol 3.125 MG tablet  Commonly known as:  COREG  1 tablet (3.125 mg total) by Per NG tube route 2 (two) times daily with a meal.     CENTRUM SILVER ADULT 50+ Tabs   Take 1 tablet by mouth every morning.     feeding supplement (JEVITY 1.2 CAL) Liqd  Place 1,000 mLs into feeding tube continuous.     ferrous sulfate 325 (65 FE) MG EC tablet  Take 325 mg by mouth daily with breakfast.     HYDROcodone-acetaminophen 7.5-325 mg/15 ml solution  Commonly known as:  HYCET  Take 15 mLs by mouth 4 (four) times daily as needed for moderate pain.     HYDROcodone-acetaminophen 7.5-325 mg/15 ml solution  Commonly known as:  HYCET  Place 10-15 mLs into feeding tube every 4 (four) hours as needed for moderate pain.     losartan 100 MG tablet  Commonly known as:  COZAAR  1 tablet (100 mg total) by Per NG tube route daily.     nitroGLYCERIN 0.4 MG SL tablet  Commonly known as:  NITROSTAT  Place 1 tablet (0.4 mg total) under the tongue every 5 (five) minutes as needed for chest pain.     OSTEO BI-FLEX ADV JOINT SHIELD Tabs  Take 1 tablet by mouth 2 (two) times daily.     promethazine 25 MG suppository  Commonly known as:  PHENERGAN  Place 1 suppository (25 mg total) rectally every 6 (six) hours as needed for nausea.     vitamin B-12 1000 MCG tablet  Commonly known as:  CYANOCOBALAMIN  1 tablet (1,000 mcg total) by Per NG tube route daily.     Vitamin D-3 1000 units Caps  1 capsule (1,000 Units total) by Per NG tube route daily.           Follow-up Information    Follow up with Izora Gala, MD. Schedule an appointment as soon as possible for a visit in 1 week.   Specialty:  Otolaryngology   Contact information:   [address] [place] [address] [phone]       Follow up with Izora Gala, MD. Schedule an appointment as soon as possible for a visit in 1 week.   Specialty:  Otolaryngology   Contact information:   [address] [place] [address] [phone]       Signed: Izora Gala 09/15/2015, 12:13 PM

## 2015-09-15 NOTE — NC FL2 (Signed)
[place] MEDICAID FL2 LEVEL OF CARE SCREENING TOOL     IDENTIFICATION  Patient Name: Manuel Le Birthdate: 08/16/1927 Sex: male Admission Date (Current Location): 09/09/2015  [facility] [place] and [place] Number:  Herbalist and Address:  [facility] [place] [address] [place], [place] [address]      Provider Number: [id number]  Attending Physician Name and Address:  Izora Gala, MD  Relative Name and Phone Number:       Current Level of Care: Hospital Recommended Level of Care: [place] Prior Approval Number:    Date Approved/Denied:   PASRR Number:    [id number]   Discharge Plan: SNF    Current Diagnoses: Patient Active Problem List   Diagnosis Date Noted  . Pressure ulcer 09/14/2015  . Zenker's diverticulum 09/09/2015  . Bladder cancer ([place]) 07/14/2015  . CAD (coronary artery disease) 07/24/2014  . S/P coronary artery stent placement (RCA bare metal stent on 07/08/14) 07/24/2014  . Exertional angina ([place]) 07/08/2014  . Encounter for therapeutic drug monitoring 09/12/2013  . Atrial fibrillation ([place]) 09/08/2013  . HTN (hypertension) 09/08/2013  . Fatigue 09/08/2013  . Anemia 06/26/2012    Orientation RESPIRATION BLADDER Height & Weight    Self, Time, Situation, Place  Normal Continent 6' (182.9 cm) 208 lbs.  BEHAVIORAL SYMPTOMS/MOOD NEUROLOGICAL BOWEL NUTRITION STATUS   (NONE)  (NONE) Continent Diet, Feeding tube, NG/panda  AMBULATORY STATUS COMMUNICATION OF NEEDS Skin   Extensive Assist Verbally PU Stage and Appropriate Care                       Personal Care Assistance Level of Assistance  Bathing, Feeding, Dressing Bathing Assistance: Maximum assistance Feeding assistance: Maximum assistance Dressing Assistance: Maximum assistance     Functional Limitations Info   (NONE)          SPECIAL CARE FACTORS FREQUENCY  PT (By licensed PT)     PT Frequency: 2              Contractures       Additional Factors Info  Code Status, Allergies Code Status Info: FULL CODE  Allergies Info: N/A           Current Medications (09/15/2015):  This is the current hospital active medication list Current Facility-Administered Medications  Medication Dose Route Frequency Provider Last Rate Last Dose  . acetaminophen (TYLENOL) tablet 1,000 mg  1,000 mg Oral Q6H PRN Izora Gala, MD   1,000 mg at 09/14/15 1245  . amLODipine (NORVASC) tablet 10 mg  10 mg Per NG tube Daily Hosie Poisson, MD   10 mg at 09/15/15 1011  . antiseptic oral rinse (CPC / CETYLPYRIDINIUM CHLORIDE 0.05%) solution 7 mL  7 mL Mouth Rinse BID Izora Gala, MD   7 mL at 09/15/15 1013  . aspirin tablet 325 mg  325 mg Per NG tube Daily Hosie Poisson, MD   325 mg at 09/15/15 1011  . atorvastatin (LIPITOR) tablet 20 mg  20 mg Oral q1800 Hosie Poisson, MD   20 mg at 09/14/15 1839  . carvedilol (COREG) tablet 3.125 mg  3.125 mg Per NG tube BID WC Hosie Poisson, MD   3.125 mg at 09/15/15 [id number]  . chlorhexidine (PERIDEX) 0.12 % solution 15 mL  15 mL Mouth Rinse BID Izora Gala, MD      . dextrose 5 %-0.9 % sodium chloride infusion   Intravenous Continuous Izora Gala, MD 10 mL/hr at 09/13/15 1000    .  feeding supplement (JEVITY 1.2 CAL) liquid 1,000 mL  1,000 mL Per Tube Continuous Jenifer A Williams, RD 70 mL/hr at 09/15/15 0600 1,000 mL at 09/15/15 0600  . free water 200 mL  200 mL Per Tube Q4H Hosie Poisson, MD   200 mL at 09/15/15 1013  . hydrALAZINE (APRESOLINE) injection 10 mg  10 mg Intravenous Q4H PRN Hosie Poisson, MD      . HYDROcodone-acetaminophen (HYCET) 7.5-325 mg/15 ml solution 10-15 mL  10-15 mL Per Tube Q4H PRN Izora Gala, MD   15 mL at 09/15/15 0826  . labetalol (NORMODYNE,TRANDATE) injection 5 mg  5 mg Intravenous Q6H PRN Hosie Poisson, MD      . losartan (COZAAR) tablet 100 mg  100 mg Oral Daily Hosie Poisson, MD   100 mg at 09/15/15 1011  . morphine 4 MG/ML injection 4 mg  4 mg Intravenous Q2H PRN Melida Quitter, MD   4 mg  at 09/13/15 0559  . nitroGLYCERIN (NITROSTAT) SL tablet 0.4 mg  0.4 mg Sublingual Q5 min PRN Izora Gala, MD      . promethazine (PHENERGAN) injection 12.5 mg  12.5 mg Intravenous Q6H PRN Hosie Poisson, MD      . promethazine (PHENERGAN) suppository 25 mg  25 mg Rectal Q6H PRN Izora Gala, MD      . TRIPLE ANTIBIOTIC [id number] OINT 1 application  1 application Apply externally Once PRN Izora Gala, MD         Discharge Medications: Please see discharge summary for a list of discharge medications.  Relevant Imaging Results:  Relevant Lab Results:   Additional Information SSN [id number]  Glendon Axe, MSW, LCSWA [phone] 09/15/2015 1:58 PM

## 2015-09-15 NOTE — Progress Notes (Signed)
Patient ID: Manuel Le, male   DOB: 12-05-1926, 82 y.o.   MRN: [id number]  No changes, he is able to swallow secretions a little better and is spitting and suctioning less.   Awaiting placement to SNF - Social Worker working on this.

## 2015-09-15 NOTE — Progress Notes (Signed)
Pt's feeding tube is clogged. Dr. Wilburn Cornelia informed that several attempts to clear the tube had failed. Received order to d/c the tube and order another tube to be reinserted in the am. Will initiate the new protocol to unclog it with enzymes and bicarb.

## 2015-09-16 ENCOUNTER — Inpatient Hospital Stay (HOSPITAL_COMMUNITY): Payer: Medicare Other

## 2015-09-16 DIAGNOSIS — R262 Difficulty in walking, not elsewhere classified: Secondary | ICD-10-CM | POA: Diagnosis not present

## 2015-09-16 DIAGNOSIS — C679 Malignant neoplasm of bladder, unspecified: Secondary | ICD-10-CM | POA: Diagnosis not present

## 2015-09-16 DIAGNOSIS — R070 Pain in throat: Secondary | ICD-10-CM | POA: Diagnosis not present

## 2015-09-16 DIAGNOSIS — R1314 Dysphagia, pharyngoesophageal phase: Secondary | ICD-10-CM | POA: Diagnosis not present

## 2015-09-16 DIAGNOSIS — I482 Chronic atrial fibrillation: Secondary | ICD-10-CM | POA: Diagnosis not present

## 2015-09-16 DIAGNOSIS — Z931 Gastrostomy status: Secondary | ICD-10-CM | POA: Diagnosis not present

## 2015-09-16 DIAGNOSIS — I4891 Unspecified atrial fibrillation: Secondary | ICD-10-CM | POA: Diagnosis not present

## 2015-09-16 DIAGNOSIS — I209 Angina pectoris, unspecified: Secondary | ICD-10-CM | POA: Diagnosis not present

## 2015-09-16 DIAGNOSIS — K225 Diverticulum of esophagus, acquired: Secondary | ICD-10-CM | POA: Diagnosis not present

## 2015-09-16 DIAGNOSIS — Z79899 Other long term (current) drug therapy: Secondary | ICD-10-CM | POA: Diagnosis not present

## 2015-09-16 DIAGNOSIS — I10 Essential (primary) hypertension: Secondary | ICD-10-CM | POA: Diagnosis not present

## 2015-09-16 DIAGNOSIS — Z8551 Personal history of malignant neoplasm of bladder: Secondary | ICD-10-CM | POA: Diagnosis not present

## 2015-09-16 DIAGNOSIS — L8990 Pressure ulcer of unspecified site, unspecified stage: Secondary | ICD-10-CM

## 2015-09-16 DIAGNOSIS — E876 Hypokalemia: Secondary | ICD-10-CM | POA: Diagnosis not present

## 2015-09-16 DIAGNOSIS — Z5181 Encounter for therapeutic drug level monitoring: Secondary | ICD-10-CM | POA: Diagnosis not present

## 2015-09-16 DIAGNOSIS — R278 Other lack of coordination: Secondary | ICD-10-CM | POA: Diagnosis not present

## 2015-09-16 DIAGNOSIS — D509 Iron deficiency anemia, unspecified: Secondary | ICD-10-CM | POA: Diagnosis not present

## 2015-09-16 DIAGNOSIS — E870 Hyperosmolality and hypernatremia: Secondary | ICD-10-CM | POA: Diagnosis not present

## 2015-09-16 DIAGNOSIS — E785 Hyperlipidemia, unspecified: Secondary | ICD-10-CM | POA: Diagnosis not present

## 2015-09-16 DIAGNOSIS — M6281 Muscle weakness (generalized): Secondary | ICD-10-CM | POA: Diagnosis not present

## 2015-09-16 DIAGNOSIS — Z4682 Encounter for fitting and adjustment of non-vascular catheter: Secondary | ICD-10-CM | POA: Diagnosis not present

## 2015-09-16 DIAGNOSIS — D649 Anemia, unspecified: Secondary | ICD-10-CM | POA: Diagnosis not present

## 2015-09-16 DIAGNOSIS — I25118 Atherosclerotic heart disease of native coronary artery with other forms of angina pectoris: Secondary | ICD-10-CM | POA: Diagnosis not present

## 2015-09-16 DIAGNOSIS — E784 Other hyperlipidemia: Secondary | ICD-10-CM | POA: Diagnosis not present

## 2015-09-16 DIAGNOSIS — K5900 Constipation, unspecified: Secondary | ICD-10-CM | POA: Diagnosis not present

## 2015-09-16 DIAGNOSIS — Z7982 Long term (current) use of aspirin: Secondary | ICD-10-CM | POA: Diagnosis not present

## 2015-09-16 DIAGNOSIS — R4702 Dysphasia: Secondary | ICD-10-CM | POA: Diagnosis not present

## 2015-09-16 LAB — GLUCOSE, CAPILLARY
GLUCOSE-CAPILLARY: 108 mg/dL — AB (ref 65–99)
GLUCOSE-CAPILLARY: 114 mg/dL — AB (ref 65–99)
Glucose-Capillary: 95 mg/dL (ref 65–99)
Glucose-Capillary: 99 mg/dL (ref 65–99)

## 2015-09-16 MED ORDER — LIDOCAINE VISCOUS 2 % MT SOLN
OROMUCOSAL | Status: AC
  Administered 2015-09-16: 6 mL via NASAL
  Filled 2015-09-16: qty 15

## 2015-09-16 MED ORDER — IOHEXOL 300 MG/ML  SOLN
50.0000 mL | Freq: Once | INTRAMUSCULAR | Status: AC | PRN
  Administered 2015-09-16: 30 mL

## 2015-09-16 NOTE — Progress Notes (Signed)
DC to [facility] by ambulance. Family ay bedside.

## 2015-09-16 NOTE — Progress Notes (Signed)
Many attempts to try to call report to [facility]. I spoke with Cherrie Distance( neither would take report),, and transferred to [facility] with no answer. I spent a total of 20 mins on hold. Instructed the facility to call me at [phone] for report.

## 2015-09-16 NOTE — Progress Notes (Signed)
Report called to [place] at [facility]

## 2015-09-16 NOTE — Care Management Important Message (Signed)
Important Message  Patient Details  Name: FABIAN ROS MRN: [id number] Date of Birth: May 15, 1927   Medicare Important Message Given:  Yes    Randie Tallarico P Deandria Klute 09/16/2015, 2:39 PM

## 2015-09-16 NOTE — Progress Notes (Signed)
Nutrition Follow-up  DOCUMENTATION CODES:   Not applicable  INTERVENTION:   -Resume Jevity 1.2 @ 70 ml/hr via NGT   Tube feeding regimen provides 2016 kcal (100% of needs), 93 grams of protein, and 1356 ml of H2O.   NUTRITION DIAGNOSIS:   Inadequate oral intake related to inability to eat as evidenced by NPO status.  Ongoing  GOAL:   Patient will meet greater than or equal to 90% of their needs  Met with TF  MONITOR:   Diet advancement, Labs, Weight trends, TF tolerance, Skin, I & O's  REASON FOR ASSESSMENT:   Consult Enteral/tube feeding initiation and management  ASSESSMENT:   82 year old gentleman, with h/o hypertension, dysphagia, presents with difficulty swallowing and regurgitation of the undigested food, barium swallow showed a large zenker's diverticulum.   Per nursing, pt's NGT tube became clogged last night and unable to unclog tube despite efforts. Pt currently down in radiology for tube replacement.   Prior to tube being clogged, pt was receiving Jevity 1.2 @ 70 ml/hr (goal rate) and was tolerating feedings well. Feeding regimen provides 2016 kcal (100% of needs), 93 grams of protein, and 1356 ml of H2O.   Reviewed SLP note from 09/16/15; pt remains with wet vocal quality and SLP recommends continued NPO status with ice chips to utilize swallowing musculature. Recommending f/u with SLP at SNF for monitoring of progress.   Therapy recommending SNF placement. CSW following for placement; plan to d/c to [facility] once bed is available.   Labs reviewed.   Diet Order:  NPO  Skin:  Reviewed, no issues  Last BM:  09/09/15  Height:   Ht Readings from Last 1 Encounters:  09/09/15 6' (1.829 m)    Weight:   Wt Readings from Last 1 Encounters:  09/16/15 207 lb 7.3 oz (94.1 kg)    Ideal Body Weight:  80.9 kg  BMI:  Body mass index is 28.13 kg/(m^2).  Estimated Nutritional Needs:   Kcal:  1900-2100  Protein:  85-100 grams  Fluid:  1.9-2.1  L  EDUCATION NEEDS:   Education needs addressed  Shane Badeaux A. Jimmye [facility], RD, LDN, CDE Pager: [phone] After hours Pager: [phone]

## 2015-09-16 NOTE — Clinical Social Work Note (Signed)
Clinical Social Work Assessment  Patient Details  Name: Manuel Le MRN: [id number] Date of Birth: 06-01-1927  Date of referral:  09/15/15               Reason for consult:  Facility Placement                Permission sought to share information with:  Family Supports Permission granted to share information::  Yes, Verbal Permission Granted  Name::     [name]  Agency::     Relationship::  [employer] Information:  [phone]  Housing/Transportation Living arrangements for the past 2 months:  [place] of Information:  Patient, Spouse Patient Interpreter Needed:  None Criminal Activity/Legal Involvement Pertinent to Current Situation/Hospitalization:  No - Comment as needed Significant Relationships:  Spouse, Other Family Members, Friend Lives with:  Spouse Do you feel safe going back to the place where you live?  No (Patient and wife understand that rehab needed before returning home.) Need for family participation in patient care:  Yes (Comment)  Care giving concerns:  None expressed by patient or spouse.   Social Worker assessment / plan:  On 1/26 CSW talked with patient and wife at the bedside regarding discharge to [facility] today. Placement had already been determined and CSW talked with wife regarding patient secondary insurance, which skilled facility needed. Patient was initially out of the room at a procedure, but once he returned, patient was able to engage in conversation with CSW.  Other family members were at the bedside and assisted wife with answering questions as needed.  Employment status:  Retired Advertising copywriter, [employer] PT Recommendations:  [place] / Referral to community resources:  Other (Comment Required) (SNF list not needed, facility placement had been decided as of 1/26.)  Patient/Family's Response to care:  No concerns regarding patient's care during  hospitalization.  Patient/Family's Understanding of and Emotional Response to Diagnosis, Current Treatment, and Prognosis:  Not discussed.  Emotional Assessment Appearance:  Appears stated age Attitude/Demeanor/Rapport:  Other (Appropriate) Affect (typically observed):  Appropriate Orientation:  Oriented to Self, Oriented to Place, Oriented to  Time, Oriented to Situation Alcohol / Substance use:   (Tobacco, alcohol, and drug use not referenced in H&P) Psych involvement (Current and /or in the community):  No (Comment)  Discharge Needs  Concerns to be addressed:  Discharge Planning Concerns Readmission within the last 30 days:  No Current discharge risk:  None Barriers to Discharge:  No Barriers Identified   Sable Feil, LCSW 09/16/2015, 5:52 PM

## 2015-09-16 NOTE — Clinical Social Work Placement (Signed)
   CLINICAL SOCIAL WORK PLACEMENT  NOTE 09/16/15 - DISCHARGED TO [name]  Date:  09/16/2015  Patient Details  Name: Manuel Le MRN: [id number] Date of Birth: 16-Dec-1926  Clinical Social Work is seeking post-discharge placement for this patient at the [place] level of care (*CSW will initial, date and re-position this form in  chart as items are completed):  Yes   Patient/family provided with [facility] Work Department's list of facilities offering this level of care within the geographic area requested by the patient (or if unable, by the patient's family).  Yes   Patient/family informed of their freedom to choose among providers that offer the needed level of care, that participate in Medicare, Medicaid or managed care program needed by the patient, have an available bed and are willing to accept the patient.  Yes   Patient/family informed of [facility]'s ownership interest in [facility] and [facility], as well as of the fact that they are under no obligation to receive care at these facilities.  PASRR submitted to EDS on 09/15/15     PASRR number received on 09/15/15     Existing PASRR number confirmed on       FL2 transmitted to all facilities in geographic area requested by pt/family on 09/15/15     FL2 transmitted to all facilities within larger geographic area on 09/15/15     Patient informed that his/her managed care company has contracts with or will negotiate with certain facilities, including the following:        Yes   Patient/family informed of bed offers received.  Patient chooses bed at  ([facility] )     Physician recommends and patient chooses bed at      Patient to be transferred to  ([facility] ) on  .  Patient to be transferred to facility by  ambulance     Patient family notified on  09/16/15 of transfer.  Name of family member notified:   Wife Manuel Le, at  the bedside.     PHYSICIAN Please sign FL2     Additional Comment:    _______________________________________________ Sable Feil, LCSW 09/16/2015, 5:57 PM

## 2015-09-16 NOTE — Progress Notes (Signed)
Patient ID: Manuel Le, male   DOB: 1926/11/05, 82 y.o.   MRN: [id number] His feeding tube clogged last night. Efforts to clear it were not successful.  He is still awaiting placement at a skilled nursing facility.  No change on exam.  We can have radiology replaced the feeding tube now that I know the anatomy is intact.

## 2015-09-16 NOTE — Progress Notes (Signed)
[facility] PROGRESS NOTE  Manuel Le [id number] DOB: May 18, 1927 DOA: 09/09/2015 PCP: Wende Neighbors, MD  Assessment/Plan: 1. Hypertensive urgency-resolved, blood pressure is controlled, continue Coreg 3.125 mg twice a day, amlodipine 10 mg daily, losartan 100 mg daily. Agree with stopping HCTZ as it can contribute to dizziness. 2. Zenker's diverticulum-status post diverticulectomy. ENT following. 3. Dizziness- patient to go to skilled nursing facility, HCTZ is discontinued. 4. Hypernatremia- sodium improved 141. 5. Hypokalemia- potassium is 3.4, replaced potassium yesterday      HPI/Subjective: 82 year old gentleman, with h/o hypertension, dysphagia, presents with difficulty swallowing and regurgitation of the undigested food, barium swallow showed a large zenker's diverticulum. Underwent diverticulectomy and since then he is not able to take po.  Overnight he had a fall and had a small laceration over his forehead.  On 1/22, a small NG tube placed for temporary feeding till his swallowing improves.  SLP eval earlier recommended taking sips of water, which he was able to do without any issues.   Patient blood pressure was better after starting home medications. Denies any headache or chest pain.  Objective: Filed Vitals:   09/16/15 0447 09/16/15 1301  BP: 159/67 168/69  Pulse: 75 67  Temp: 98.7 F (37.1 C) 98.2 F (36.8 C)  Resp: 17 16    Intake/Output Summary (Last 24 hours) at 09/16/15 1516 Last data filed at 09/16/15 1130  Gross per 24 hour  Intake    600 ml  Output   1950 ml  Net  -1350 ml   Filed Weights   09/14/15 1922 09/15/15 0541 09/16/15 0447  Weight: 94.257 kg (207 lb 12.8 oz) 93.668 kg (206 lb 8 oz) 94.1 kg (207 lb 7.3 oz)    Exam:   General:  Appears in no acute distress  Cardiovascular: S1-S2 regular  Respiratory: Clear to auscultation bilaterally  Abdomen: Soft, nontender, no organomegaly  Musculoskeletal: No  cyanosis/clubbing/edema. Extremities   Data Reviewed: Basic Metabolic Panel:  Recent Labs Lab 09/10/15 1310 09/14/15 0708 09/15/15 0400  NA 141 146* 141  K 3.8 3.5 3.4*  CL 105 111 100*  CO2 26 26 26   GLUCOSE 164* 150* 135*  BUN 18 23* 16  CREATININE 0.88 0.75 0.63  CALCIUM 9.0 8.9 8.2*  CBC: No results for input(s): WBC, NEUTROABS, HGB, HCT, MCV, PLT in the last 168 hours. CBG:  Recent Labs Lab 09/15/15 1223 09/15/15 2006 09/16/15 0002 09/16/15 0422 09/16/15 0754  GLUCAP 137* 91 99 108* 95    No results found for this or any previous visit (from the past 240 hour(s)).   Studies: Dg Abd 1 View  09/16/2015  CLINICAL DATA:  Bedside feeding tube replacement with fluoroscopic guidance, performed by the radiologic technologist. EXAM: ABDOMEN - 1 VIEW COMPARISON:  09/12/2015. FLUOROSCOPY TIME:  Fluoroscopy time:  1 min 30 sec No permanent x-ray images were obtained with a fixed unit. A spot image was obtained and is submitted for interpretation. FINDINGS: Feeding tube courses through the stomach and duodenum and has its tip in the proximal jejunum. This was confirmed with an injection of approximately 30 ml Omnipaque 300 via the feeding tube. IMPRESSION: Feeding tube tip in the proximal jejunum. The feeding tube is ready for immediate use. Electronically Signed   By: Evangeline Dakin M.D.   On: 09/16/2015 12:45   Dg Addison Bailey G Tube Plc W/fl-no Rad  09/16/2015  CLINICAL DATA:  NASO G TUBE PLACEMENT WITH FLUORO Fluoroscopy was utilized by the requesting physician.  No radiographic interpretation.  Dg Swallowing Func-speech Pathology  09/15/2015  Objective Swallowing Evaluation: Type of Study: MBS-Modified Barium Swallow Study Patient Details Name: SAYON PRIDE MRN: [id number] Date of Birth: 08-14-1927 Today's Date: 09/15/2015 Time: SLP Start Time (ACUTE ONLY): 1056-SLP Stop Time (ACUTE ONLY): 1112 SLP Time Calculation (min) (ACUTE ONLY): 16 min Past Medical History: Past Medical History  Diagnosis Date . Hypertension  . Coronary artery disease  . Iron deficiency anemia  . Bladder cancer ([place])  . Hematuria  . Atrial fibrillation and flutter ([place])  . Mild obstructive sleep apnea    no cpap . Exertional angina ([place]) 07/08/2014 . Arthritis    OSTEO . S/P coronary artery stent placement 07/24/2014 . Dysrhythmia  . Kidney stones    hx of  . H/O urinary frequency  . Weakness  Past Surgical History: Past Surgical History Procedure Laterality Date . Colonoscopy  07/04/2012   Procedure: COLONOSCOPY;  Surgeon: Rogene Houston, MD;  [facility];  Service: Endoscopy;  Laterality: N/A;  320 . Esophagogastroduodenoscopy  07/04/2012   Procedure: ESOPHAGOGASTRODUODENOSCOPY (EGD);  Surgeon: Rogene Houston, MD;  Location: [facility];  Service: Endoscopy;  Laterality: N/A; . Cardiovascular stress test  12-15-2013  DR Kate Sable ([place])   MILD - MODERATE PERI-INFARCT ISCHEMIA  OF INFERIOR WALL, MID-INFEROSEPTAL WALL, MID-INFEROLATERAL WALL, & BASAL INFERIOR WALL/   NORMAL LVF /  EF 59%/  INTERMEDIATE RISK STUDY . Transthoracic echocardiogram  09-11-2013   MODERATE LVH/  EF 55-60%/  MILD MR & TR/  MILD TO MODERATE CALCIFIED AV WITHOUT STENOSIS/  MILD LAE/   MODERATE PR . Total knee arthroplasty Bilateral 2000  &  2003? Marland Kitchen Hernia repair Right  . Transurethral resection of bladder tumor with gyrus (turbt-gyrus) N/A 12/31/2013   Procedure: TRANSURETHRAL RESECTION OF BLADDER TUMOR WITH GYRUS (TURBT-GYRUS);  Surgeon: Alexis Frock, MD;  Location: [facility] ORS;  Service: Urology;  Laterality: N/A; . Cystoscopy w/ retrogrades Bilateral 12/31/2013   Procedure: CYSTOSCOPY WITH RETROGRADE PYELOGRAM;  Surgeon: Alexis Frock, MD;  Location: [facility];  Service: Urology;  Laterality: Bilateral; . Coronary stent placement  07/08/2014   PTCA of RCA   DR COOPER . Left heart catheterization with coronary angiogram N/A 07/08/2014   Procedure: LEFT HEART CATHETERIZATION WITH CORONARY ANGIOGRAM;  Surgeon: Blane Ohara, MD;  Location: [facility] CATH LAB;  Service: Cardiovascular;  Laterality: N/A; . Percutaneous coronary stent intervention (pci-s)  07/08/2014   Procedure: PERCUTANEOUS CORONARY STENT INTERVENTION (PCI-S);  Surgeon: Blane Ohara, MD;  Location: [facility] C[facility] LAB;  Service: Cardiovascular;; . Eye surgery     past cataract surgery bilat . Transurethral resection of bladder tumor N/A 01/20/2015   Procedure: TRANSURETHRAL RESECTION OF BLADDER TUMOR (TURBT);  Surgeon: Alexis Frock, MD;  Location: [facility];  Service: Urology;  Laterality: N/A; . Cystoscopy w/ retrogrades Bilateral 01/20/2015   Procedure: CYSTOSCOPY WITH RETROGRADE PYELOGRAM;  Surgeon: Alexis Frock, MD;  Location: [facility];  Service: Urology;  Laterality: Bilateral; . Cardiac catheterization   . Zenker's diverticulectomy endoscopic  09/09/2015 . Zenker's diverticulectomy N/A 09/09/2015   Procedure: ENDOSCOPIC ZENKER'S DIVERTICULECTOMY;  Surgeon: Izora Gala, MD;  Location: [facility] OR;  Service: ENT;  Laterality: N/A; HPI: 82 yr old pt with 5-6 month history of difficulty swallowing and regurgitation of undigested food after eating. No history of heartburn or reflux. Other PMH: HTN, CAD, mild OSA. Per MD note barium esophagram revealeda large Zenker's diverticulum. Pt admitted for surgical repair of Zenker's 1/19 resulting in ability to swallow secretions, odnophagia and  ST consult.  Subjective: pt wants a steak Assessment / Plan / Recommendation CHL IP CLINICAL IMPRESSIONS 09/15/2015 Therapy Diagnosis Suspected primary esophageal dysphagia Clinical Impression Pt has a severe dysphagia due to backflow of all consistencies from the esophagus to the UES, pharynx, and even larynx. Penetration and aspiration occur after the swallow both from backflow of material as well as residue that remains in the pharynx, as boluses cannot enter further into the esophagus. Chin tuck attempted to facilitate clearance into the UES without improvements. Recommend to remain NPO except for a  few ice chips after oral care. MD may wish to consider further assessment of the esophagus. Impact on safety and function Severe aspiration risk   CHL IP TREATMENT RECOMMENDATION 09/15/2015 Treatment Recommendations Therapy as outlined in treatment plan below   Prognosis 09/15/2015 Prognosis for Safe Diet Advancement Guarded Barriers to Reach Goals Severity of deficits;Other (Comment) Barriers/Prognosis Comment -- CHL IP DIET RECOMMENDATION 09/15/2015 SLP Diet Recommendations NPO;Ice chips PRN after oral care Liquid Administration via -- Medication Administration Via alternative means Compensations -- Postural Changes --   CHL IP OTHER RECOMMENDATIONS 09/15/2015 Recommended Consults Consider GI evaluation Oral Care Recommendations Oral care QID;Other (Comment) Other Recommendations --   CH[facility] IP FOLLOW UP RECOMMENDATIONS 09/15/2015 Follow up Recommendations Skilled Nursing facility   [facility] IP FREQUENCY AND DURATION 09/15/2015 Speech Therapy Frequency (ACUTE ONLY) min 2x/week Treatment Duration 2 weeks      CHL IP ORAL PHASE 09/15/2015 Oral Phase WFL Oral - Pudding Teaspoon -- Oral - Pudding Cup -- Oral - Honey Teaspoon -- Oral - Honey Cup -- Oral - Nectar Teaspoon -- Oral - Nectar Cup -- Oral - Nectar Straw -- Oral - Thin Teaspoon -- Oral - Thin Cup -- Oral - Thin Straw -- Oral - Puree -- Oral - Mech Soft -- Oral - Regular -- Oral - Multi-Consistency -- Oral - Pill -- Oral Phase - Comment --  CHL IP PHARYNGEAL PHASE 09/15/2015 Pharyngeal Phase Impaired Pharyngeal- Pudding Teaspoon -- Pharyngeal -- Pharyngeal- Pudding Cup -- Pharyngeal -- Pharyngeal- Honey Teaspoon Delayed swallow initiation-vallecula;Pharyngeal residue - cp segment;Pharyngeal residue - posterior pharnyx;Penetration/Apiration after swallow;Significant aspiration (Amount);Compensatory strategies attempted (with notebox) Pharyngeal Material enters airway, passes BELOW cords and not ejected out despite cough attempt by patient Pharyngeal- Honey Cup -- Pharyngeal --  Pharyngeal- Nectar Teaspoon Delayed swallow initiation-vallecula;Pharyngeal residue - cp segment;Pharyngeal residue - posterior pharnyx;Penetration/Apiration after swallow Pharyngeal Material enters airway, CONTACTS cords and then ejected out Pharyngeal- Nectar Cup -- Pharyngeal -- Pharyngeal- Nectar Straw -- Pharyngeal -- Pharyngeal- Thin Teaspoon -- Pharyngeal -- Pharyngeal- Thin Cup -- Pharyngeal -- Pharyngeal- Thin Straw -- Pharyngeal -- Pharyngeal- Puree Delayed swallow initiation-vallecula;Pharyngeal residue - cp segment;Pharyngeal residue - posterior pharnyx;Penetration/Apiration after swallow;Significant aspiration (Amount) Pharyngeal Material enters airway, remains ABOVE vocal cords then ejected out Pharyngeal- Mechanical Soft -- Pharyngeal -- Pharyngeal- Regular -- Pharyngeal -- Pharyngeal- Multi-consistency -- Pharyngeal -- Pharyngeal- Pill -- Pharyngeal -- Pharyngeal Comment --  CHL IP CERVICAL ESOPHAGEAL PHASE 09/15/2015 Cervical Esophageal Phase Impaired Pudding Teaspoon -- Pudding Cup -- Honey Teaspoon Esophageal backflow into the larynx;Esophageal backflow into the pharynx;Esophageal backflow into cervical esophagus Honey Cup -- Nectar Teaspoon Esophageal backflow into the larynx;Esophageal backflow into the pharynx;Esophageal backflow into cervical esophagus Nectar Cup -- Nectar Straw -- Thin Teaspoon -- Thin Cup -- Thin Straw -- Puree Esophageal backflow into the pharynx;Esophageal backflow into cervical esophagus;Esophageal backflow into the larynx Mechanical Soft -- Regular -- Multi-consistency -- Pill -- Cervical Esophageal Comment -- No flowsheet data found. Germain Osgood,  M.A. CCC-SLP [phone] Germain Osgood 09/15/2015, 12:22 PM               Scheduled Meds: . amLODipine  10 mg Per NG tube Daily  . antiseptic oral rinse  7 mL Mouth Rinse BID  . aspirin  325 mg Per NG tube Daily  . atorvastatin  20 mg Oral q1800  . carvedilol  3.125 mg Per NG tube BID WC  . chlorhexidine  15  mL Mouth Rinse BID  . free water  200 mL Per Tube Q4H  . losartan  100 mg Oral Daily   Continuous Infusions: . dextrose 5 % and 0.9% NaCl 75 mL/hr at 09/15/15 2206  . feeding supplement (JEVITY 1.2 CAL) 1,000 mL (09/15/15 0600)    Active Problems:   HTN (hypertension)   Zenker's diverticulum   Pressure ulcer    Time spent: 25 min    [facility] Pager [phone]. If 7PM-7AM, please contact night-coverage at [email or web address], password [email or web address] 09/16/2015, 3:16 PM  LOS: 6 days

## 2015-09-16 NOTE — Progress Notes (Signed)
Speech Language Pathology Treatment: Dysphagia  Patient Details Name: Manuel Le MRN: [id number] DOB: 1927-08-06 Today's Date: 09/16/2015 Time: 1000-1012 SLP Time Calculation (min) (ACUTE ONLY): 12 min  Assessment / Plan / Recommendation Clinical Impression  Pt reports that he is "not feeling well" today, although does not specify further. He believes his swallowing is unchanged, and I do as well. Baseline wet vocal quality and coughing indicative of poor secretion management. Overt signs of aspiration are noted with ice chip trials as well. Recommend to continue NPO with ice chips PRN after oral care to utilize swallowing musculature as he remains NPO. He will need additional f/u at SNF to monitor for progress.    HPI HPI: 82 yr old pt with 5-6 month history of difficulty swallowing and regurgitation of undigested food after eating. No history of heartburn or reflux. Other PMH: HTN, CAD, mild OSA. Per MD note barium esophagram revealeda large Zenker's diverticulum. Pt admitted for surgical repair of Zenker's 1/19 resulting in ability to swallow secretions, odnophagia and ST consult.       SLP Plan  Continue with current plan of care     Recommendations  Diet recommendations: NPO Medication Administration: Via alternative means             Oral Care Recommendations: Oral care QID;Oral care prior to ice chip/H20 Follow up Recommendations: Skilled Nursing facility Plan: Continue with current plan of care     GO               Germain Osgood, M.A. CCC-SLP [phone]  Germain Osgood 09/16/2015, 10:13 AM

## 2015-09-17 DIAGNOSIS — D509 Iron deficiency anemia, unspecified: Secondary | ICD-10-CM | POA: Diagnosis not present

## 2015-09-17 DIAGNOSIS — E784 Other hyperlipidemia: Secondary | ICD-10-CM | POA: Diagnosis not present

## 2015-09-17 DIAGNOSIS — I25118 Atherosclerotic heart disease of native coronary artery with other forms of angina pectoris: Secondary | ICD-10-CM | POA: Diagnosis not present

## 2015-09-17 DIAGNOSIS — C679 Malignant neoplasm of bladder, unspecified: Secondary | ICD-10-CM | POA: Diagnosis not present

## 2015-09-17 DIAGNOSIS — I482 Chronic atrial fibrillation: Secondary | ICD-10-CM | POA: Diagnosis not present

## 2015-09-17 DIAGNOSIS — I10 Essential (primary) hypertension: Secondary | ICD-10-CM | POA: Diagnosis not present

## 2015-09-17 DIAGNOSIS — K225 Diverticulum of esophagus, acquired: Secondary | ICD-10-CM | POA: Diagnosis not present

## 2015-09-17 DIAGNOSIS — E876 Hypokalemia: Secondary | ICD-10-CM | POA: Diagnosis not present

## 2015-09-18 DIAGNOSIS — R4702 Dysphasia: Secondary | ICD-10-CM | POA: Diagnosis not present

## 2015-09-18 DIAGNOSIS — I10 Essential (primary) hypertension: Secondary | ICD-10-CM | POA: Diagnosis not present

## 2015-09-18 DIAGNOSIS — K225 Diverticulum of esophagus, acquired: Secondary | ICD-10-CM | POA: Diagnosis not present

## 2015-09-18 DIAGNOSIS — K5900 Constipation, unspecified: Secondary | ICD-10-CM | POA: Diagnosis not present

## 2015-09-23 DIAGNOSIS — R4702 Dysphasia: Secondary | ICD-10-CM | POA: Diagnosis not present

## 2015-09-23 DIAGNOSIS — I10 Essential (primary) hypertension: Secondary | ICD-10-CM | POA: Diagnosis not present

## 2015-09-23 DIAGNOSIS — E785 Hyperlipidemia, unspecified: Secondary | ICD-10-CM | POA: Diagnosis not present

## 2015-09-23 DIAGNOSIS — K225 Diverticulum of esophagus, acquired: Secondary | ICD-10-CM | POA: Diagnosis not present

## 2015-09-28 DIAGNOSIS — R4702 Dysphasia: Secondary | ICD-10-CM | POA: Diagnosis not present

## 2015-09-28 DIAGNOSIS — I10 Essential (primary) hypertension: Secondary | ICD-10-CM | POA: Diagnosis not present

## 2015-09-28 DIAGNOSIS — K225 Diverticulum of esophagus, acquired: Secondary | ICD-10-CM | POA: Diagnosis not present

## 2015-09-28 DIAGNOSIS — E785 Hyperlipidemia, unspecified: Secondary | ICD-10-CM | POA: Diagnosis not present

## 2015-10-06 DIAGNOSIS — Z48813 Encounter for surgical aftercare following surgery on the respiratory system: Secondary | ICD-10-CM | POA: Diagnosis not present

## 2015-10-06 DIAGNOSIS — R1312 Dysphagia, oropharyngeal phase: Secondary | ICD-10-CM | POA: Diagnosis not present

## 2015-10-07 DIAGNOSIS — R1312 Dysphagia, oropharyngeal phase: Secondary | ICD-10-CM | POA: Diagnosis not present

## 2015-10-07 DIAGNOSIS — Z48813 Encounter for surgical aftercare following surgery on the respiratory system: Secondary | ICD-10-CM | POA: Diagnosis not present

## 2015-10-12 DIAGNOSIS — R1312 Dysphagia, oropharyngeal phase: Secondary | ICD-10-CM | POA: Diagnosis not present

## 2015-10-12 DIAGNOSIS — Z48813 Encounter for surgical aftercare following surgery on the respiratory system: Secondary | ICD-10-CM | POA: Diagnosis not present

## 2015-10-18 DIAGNOSIS — R5382 Chronic fatigue, unspecified: Secondary | ICD-10-CM | POA: Diagnosis not present

## 2015-10-18 DIAGNOSIS — M353 Polymyalgia rheumatica: Secondary | ICD-10-CM | POA: Diagnosis not present

## 2015-10-18 DIAGNOSIS — R1310 Dysphagia, unspecified: Secondary | ICD-10-CM | POA: Diagnosis not present

## 2015-10-18 DIAGNOSIS — L989 Disorder of the skin and subcutaneous tissue, unspecified: Secondary | ICD-10-CM | POA: Diagnosis not present

## 2015-10-21 DIAGNOSIS — X32XXXD Exposure to sunlight, subsequent encounter: Secondary | ICD-10-CM | POA: Diagnosis not present

## 2015-10-21 DIAGNOSIS — L570 Actinic keratosis: Secondary | ICD-10-CM | POA: Diagnosis not present

## 2015-10-21 DIAGNOSIS — L820 Inflamed seborrheic keratosis: Secondary | ICD-10-CM | POA: Diagnosis not present

## 2015-10-21 DIAGNOSIS — Z08 Encounter for follow-up examination after completed treatment for malignant neoplasm: Secondary | ICD-10-CM | POA: Diagnosis not present

## 2015-10-21 DIAGNOSIS — Z85828 Personal history of other malignant neoplasm of skin: Secondary | ICD-10-CM | POA: Diagnosis not present

## 2015-11-10 DIAGNOSIS — J00 Acute nasopharyngitis [common cold]: Secondary | ICD-10-CM | POA: Diagnosis not present

## 2015-11-10 DIAGNOSIS — R05 Cough: Secondary | ICD-10-CM | POA: Diagnosis not present

## 2015-11-25 DIAGNOSIS — Z0000 Encounter for general adult medical examination without abnormal findings: Secondary | ICD-10-CM | POA: Diagnosis not present

## 2015-11-25 DIAGNOSIS — C670 Malignant neoplasm of trigone of bladder: Secondary | ICD-10-CM | POA: Diagnosis not present

## 2015-11-25 DIAGNOSIS — R310 Gross hematuria: Secondary | ICD-10-CM | POA: Diagnosis not present

## 2015-12-02 DIAGNOSIS — L820 Inflamed seborrheic keratosis: Secondary | ICD-10-CM | POA: Diagnosis not present

## 2015-12-14 DIAGNOSIS — H538 Other visual disturbances: Secondary | ICD-10-CM | POA: Diagnosis not present

## 2015-12-14 DIAGNOSIS — Z961 Presence of intraocular lens: Secondary | ICD-10-CM | POA: Diagnosis not present

## 2015-12-16 ENCOUNTER — Other Ambulatory Visit (HOSPITAL_COMMUNITY): Payer: Self-pay | Admitting: Internal Medicine

## 2015-12-16 ENCOUNTER — Ambulatory Visit (HOSPITAL_COMMUNITY)
Admission: RE | Admit: 2015-12-16 | Discharge: 2015-12-16 | Disposition: A | Discharge status: Home / Self Care | Payer: Medicare Other | Source: Ambulatory Visit | Attending: Internal Medicine | Admitting: Internal Medicine

## 2015-12-16 DIAGNOSIS — M25562 Pain in left knee: Secondary | ICD-10-CM

## 2015-12-16 DIAGNOSIS — Z96652 Presence of left artificial knee joint: Secondary | ICD-10-CM | POA: Diagnosis not present

## 2015-12-16 DIAGNOSIS — M353 Polymyalgia rheumatica: Secondary | ICD-10-CM | POA: Diagnosis not present

## 2015-12-16 DIAGNOSIS — Z471 Aftercare following joint replacement surgery: Secondary | ICD-10-CM | POA: Diagnosis not present

## 2015-12-16 DIAGNOSIS — C679 Malignant neoplasm of bladder, unspecified: Secondary | ICD-10-CM | POA: Diagnosis not present

## 2016-02-28 DIAGNOSIS — D509 Iron deficiency anemia, unspecified: Secondary | ICD-10-CM | POA: Diagnosis not present

## 2016-02-28 DIAGNOSIS — E782 Mixed hyperlipidemia: Secondary | ICD-10-CM | POA: Diagnosis not present

## 2016-02-29 DIAGNOSIS — M353 Polymyalgia rheumatica: Secondary | ICD-10-CM | POA: Diagnosis not present

## 2016-02-29 DIAGNOSIS — I10 Essential (primary) hypertension: Secondary | ICD-10-CM | POA: Diagnosis not present

## 2016-02-29 DIAGNOSIS — L570 Actinic keratosis: Secondary | ICD-10-CM | POA: Diagnosis not present

## 2016-02-29 DIAGNOSIS — C679 Malignant neoplasm of bladder, unspecified: Secondary | ICD-10-CM | POA: Diagnosis not present

## 2016-02-29 DIAGNOSIS — R5382 Chronic fatigue, unspecified: Secondary | ICD-10-CM | POA: Diagnosis not present

## 2016-02-29 DIAGNOSIS — R7301 Impaired fasting glucose: Secondary | ICD-10-CM | POA: Diagnosis not present

## 2016-04-18 ENCOUNTER — Other Ambulatory Visit: Payer: Self-pay

## 2016-06-01 DIAGNOSIS — R350 Frequency of micturition: Secondary | ICD-10-CM | POA: Diagnosis not present

## 2016-06-01 DIAGNOSIS — C670 Malignant neoplasm of trigone of bladder: Secondary | ICD-10-CM | POA: Diagnosis not present

## 2016-06-01 DIAGNOSIS — N200 Calculus of kidney: Secondary | ICD-10-CM | POA: Diagnosis not present

## 2016-06-01 DIAGNOSIS — R310 Gross hematuria: Secondary | ICD-10-CM | POA: Diagnosis not present

## 2016-06-23 DIAGNOSIS — E119 Type 2 diabetes mellitus without complications: Secondary | ICD-10-CM | POA: Diagnosis not present

## 2016-06-23 DIAGNOSIS — E785 Hyperlipidemia, unspecified: Secondary | ICD-10-CM | POA: Diagnosis not present

## 2016-06-23 DIAGNOSIS — E039 Hypothyroidism, unspecified: Secondary | ICD-10-CM | POA: Diagnosis not present

## 2016-06-23 DIAGNOSIS — E782 Mixed hyperlipidemia: Secondary | ICD-10-CM | POA: Diagnosis not present

## 2016-06-23 DIAGNOSIS — I482 Chronic atrial fibrillation: Secondary | ICD-10-CM | POA: Diagnosis not present

## 2016-06-23 DIAGNOSIS — R7301 Impaired fasting glucose: Secondary | ICD-10-CM | POA: Diagnosis not present

## 2016-06-23 DIAGNOSIS — N529 Male erectile dysfunction, unspecified: Secondary | ICD-10-CM | POA: Diagnosis not present

## 2016-06-23 DIAGNOSIS — Z23 Encounter for immunization: Secondary | ICD-10-CM | POA: Diagnosis not present

## 2016-06-23 DIAGNOSIS — I10 Essential (primary) hypertension: Secondary | ICD-10-CM | POA: Diagnosis not present

## 2016-07-04 DIAGNOSIS — R7301 Impaired fasting glucose: Secondary | ICD-10-CM | POA: Diagnosis not present

## 2016-07-04 DIAGNOSIS — Z6827 Body mass index (BMI) 27.0-27.9, adult: Secondary | ICD-10-CM | POA: Diagnosis not present

## 2016-07-04 DIAGNOSIS — L570 Actinic keratosis: Secondary | ICD-10-CM | POA: Diagnosis not present

## 2016-07-04 DIAGNOSIS — I10 Essential (primary) hypertension: Secondary | ICD-10-CM | POA: Diagnosis not present

## 2016-07-04 DIAGNOSIS — R5382 Chronic fatigue, unspecified: Secondary | ICD-10-CM | POA: Diagnosis not present

## 2016-07-04 DIAGNOSIS — Z23 Encounter for immunization: Secondary | ICD-10-CM | POA: Diagnosis not present

## 2016-07-04 DIAGNOSIS — C679 Malignant neoplasm of bladder, unspecified: Secondary | ICD-10-CM | POA: Diagnosis not present

## 2016-07-04 DIAGNOSIS — Z0000 Encounter for general adult medical examination without abnormal findings: Secondary | ICD-10-CM | POA: Diagnosis not present

## 2016-07-04 DIAGNOSIS — M353 Polymyalgia rheumatica: Secondary | ICD-10-CM | POA: Diagnosis not present

## 2016-07-04 DIAGNOSIS — D509 Iron deficiency anemia, unspecified: Secondary | ICD-10-CM | POA: Diagnosis not present

## 2016-07-27 ENCOUNTER — Ambulatory Visit (INDEPENDENT_AMBULATORY_CARE_PROVIDER_SITE_OTHER): Payer: Medicare Other | Admitting: Cardiovascular Disease

## 2016-07-27 ENCOUNTER — Encounter: Payer: Self-pay | Admitting: Cardiovascular Disease

## 2016-07-27 VITALS — BP 138/68 | HR 85 | Ht 72.0 in | Wt 212.0 lb

## 2016-07-27 DIAGNOSIS — I2510 Atherosclerotic heart disease of native coronary artery without angina pectoris: Secondary | ICD-10-CM

## 2016-07-27 DIAGNOSIS — Z955 Presence of coronary angioplasty implant and graft: Secondary | ICD-10-CM

## 2016-07-27 DIAGNOSIS — I10 Essential (primary) hypertension: Secondary | ICD-10-CM

## 2016-07-27 DIAGNOSIS — I4891 Unspecified atrial fibrillation: Secondary | ICD-10-CM

## 2016-07-27 DIAGNOSIS — R5383 Other fatigue: Secondary | ICD-10-CM | POA: Diagnosis not present

## 2016-07-27 DIAGNOSIS — C671 Malignant neoplasm of dome of bladder: Secondary | ICD-10-CM

## 2016-07-27 NOTE — Progress Notes (Signed)
SUBJECTIVE: Manuel Le presents for follow up of CAD. He underwent bare metal stent placement to the ostium of the RCA on 07/08/14.  He underwent transurethral resection for bladder tumor recurrence on 01/20/15.   He denies exertional chest pain, bleeding problems, and dizziness.  He continues to experience fatigue. He was getting hormone replacement therapy which made him feel better. He has occasional mild ankle swelling. He said he ate too much over Thanksgiving which has made him slightly more short of breath.  ECG performed today shows normal sinus rhythm.   Review of Systems: As per "subjective", otherwise negative.  No Known Allergies  Current Outpatient Prescriptions  Medication Sig Dispense Refill  . amLODipine (NORVASC) 10 MG tablet 1 tablet (10 mg total) by Per NG tube route daily. 30 tablet 0  . aspirin 325 MG tablet 1 tablet (325 mg total) by Per NG tube route daily. 30 tablet 0  . atorvastatin (LIPITOR) 20 MG tablet 1 tablet (20 mg total) by Per NG tube route daily at 6 PM. 30 tablet 0  . carvedilol (COREG) 3.125 MG tablet 1 tablet (3.125 mg total) by Per NG tube route 2 (two) times daily with a meal. 60 tablet 0  . Cholecalciferol (VITAMIN D-3) 1000 units CAPS 1 capsule (1,000 Units total) by Per NG tube route daily. 60 capsule 0  . ferrous sulfate 325 (65 FE) MG EC tablet Take 325 mg by mouth daily with breakfast.    . HYDROcodone-acetaminophen (HYCET) 7.5-325 mg/15 ml solution Take 15 mLs by mouth 4 (four) times daily as needed for moderate pain. 473 mL 0  . HYDROcodone-acetaminophen (HYCET) 7.5-325 mg/15 ml solution Place 10-15 mLs into feeding tube every 4 (four) hours as needed for moderate pain. 120 mL 0  . losartan (COZAAR) 100 MG tablet 1 tablet (100 mg total) by Per NG tube route daily. 30 tablet 0  . Misc Natural Products (OSTEO BI-FLEX ADV JOINT SHIELD) TABS Take 1 tablet by mouth 2 (two) times daily.     . Multiple Vitamins-Minerals (CENTRUM SILVER ADULT  50+) TABS Take 1 tablet by mouth every morning.    . nitroGLYCERIN (NITROSTAT) 0.4 MG SL tablet Place 1 tablet (0.4 mg total) under the tongue every 5 (five) minutes as needed for chest pain. 25 tablet 3  . Nutritional Supplements (FEEDING SUPPLEMENT, JEVITY 1.2 CAL,) LIQD Place 1,000 mLs into feeding tube continuous. 1000 mL 90  . promethazine (PHENERGAN) 25 MG suppository Place 1 suppository (25 mg total) rectally every 6 (six) hours as needed for nausea. 12 each 0  . vitamin B-12 (CYANOCOBALAMIN) 1000 MCG tablet 1 tablet (1,000 mcg total) by Per NG tube route daily. 30 tablet 0   No current facility-administered medications for this visit.     Past Medical History:  Diagnosis Date  . Arthritis    OSTEO  . Atrial fibrillation and flutter ([place])   . Bladder cancer ([place])   . Coronary artery disease   . Dysrhythmia   . Exertional angina ([place]) 07/08/2014  . H/O urinary frequency   . Hematuria   . Hypertension   . Iron deficiency anemia   . Kidney stones    hx of   . Mild obstructive sleep apnea    no cpap  . S/P coronary artery stent placement 07/24/2014  . Weakness     Past Surgical History:  Procedure Laterality Date  . CARDIAC CATHETERIZATION    . CARDIOVASCULAR STRESS TEST  12-15-2013  DR Kate Sable (  [facility])   MILD - MODERATE PERI-INFARCT ISCHEMIA  OF INFERIOR WALL, MID-INFEROSEPTAL WALL, MID-INFEROLATERAL WALL, & BASAL INFERIOR WALL/   NORMAL LVF /  EF 59%/  INTERMEDIATE RISK STUDY  . COLONOSCOPY  07/04/2012   Procedure: COLONOSCOPY;  Surgeon: Rogene Houston, MD;  Location: [facility];  Service: Endoscopy;  Laterality: N/A;  320  . CORONARY STENT PLACEMENT  07/08/2014   PTCA of RCA   DR COOPER  . CYSTOSCOPY W/ RETROGRADES Bilateral 12/31/2013   Procedure: CYSTOSCOPY WITH RETROGRADE PYELOGRAM;  Surgeon: Alexis Frock, MD;  Location: [facility];  Service: Urology;  Laterality: Bilateral;  . CYSTOSCOPY W/ RETROGRADES Bilateral 01/20/2015   Procedure:  CYSTOSCOPY WITH RETROGRADE PYELOGRAM;  Surgeon: Alexis Frock, MD;  Location: [facility];  Service: Urology;  Laterality: Bilateral;  . ESOPHAGOGASTRODUODENOSCOPY  07/04/2012   Procedure: ESOPHAGOGASTRODUODENOSCOPY (EGD);  Surgeon: Rogene Houston, MD;  Location: [facility];  Service: Endoscopy;  Laterality: N/A;  . EYE SURGERY     past cataract surgery bilat  . HERNIA REPAIR Right   . LEFT HEART CATHETERIZATION WITH CORONARY ANGIOGRAM N/A 07/08/2014   Procedure: LEFT HEART CATHETERIZATION WITH CORONARY ANGIOGRAM;  Surgeon: Blane Ohara, MD;  Location: [facility] CATH LAB;  Service: Cardiovascular;  Laterality: N/A;  . PERCUTANEOUS CORONARY STENT INTERVENTION (PCI-S)  07/08/2014   Procedure: PERCUTANEOUS CORONARY STENT INTERVENTION (PCI-S);  Surgeon: Blane Ohara, MD;  Location: [facility] LAB;  Service: Cardiovascular;;  . TOTAL KNEE ARTHROPLASTY Bilateral 2000  &  2003?  . TRANSTHORACIC ECHOCARDIOGRAM  09-11-2013   MODERATE LVH/  EF 55-60%/  MILD MR & TR/  MILD TO MODERATE CALCIFIED AV WITHOUT STENOSIS/  MILD LAE/   MODERATE PR  . TRANSURETHRAL RESECTION OF BLADDER TUMOR N/A 01/20/2015   Procedure: TRANSURETHRAL RESECTION OF BLADDER TUMOR (TURBT);  Surgeon: Alexis Frock, MD;  Location: [facility];  Service: Urology;  Laterality: N/A;  . TRANSURETHRAL RESECTION OF BLADDER TUMOR WITH GYRUS (TURBT-GYRUS) N/A 12/31/2013   Procedure: TRANSURETHRAL RESECTION OF BLADDER TUMOR WITH GYRUS (TURBT-GYRUS);  Surgeon: Alexis Frock, MD;  Location: [facility];  Service: Urology;  Laterality: N/A;  . ZENKER'S DIVERTICULECTOMY N/A 09/09/2015   Procedure: ENDOSCOPIC ZENKER'S DIVERTICULECTOMY;  Surgeon: Izora Gala, MD;  Location: [place];  Service: ENT;  Laterality: N/A;  . ZENKER'S DIVERTICULECTOMY ENDOSCOPIC  09/09/2015    Social History   Social History  . Marital status: Married    Spouse name: N/A  . Number of children: N/A  . Years of education: N/A   Occupational History  . Not on file.   Social History  Main Topics  . Smoking status: Former Smoker    Packs/day: 2.00    Years: 20.00    Types: Cigarettes, Cigars, Pipe    Quit date: 08/21/1968  . Smokeless tobacco: Never Used     Comment: quit smoking in 1980  . Alcohol use No  . Drug use: No  . Sexual activity: Not on file   Other Topics Concern  . Not on file   Social History Narrative  . No narrative on file     Vitals:   07/27/16 0901  BP: 138/68  Pulse: 85  SpO2: 98%  Weight: 212 lb (96.2 kg)  Height: 6' (1.829 m)    PHYSICAL EXAM General: NAD  Neck: No JVD, no thyromegaly or thyroid nodule.  Lungs: Clear to auscultation bilaterally with normal respiratory effort.  CV: Nondisplaced PMI. Regular rate and mostly regular rhythm, normal S1/S2, no S3/S4, II/VI ejection systolic  murmur at RUSB and II/VI apical holosystolic murmur. No peripheral edema.  Abdomen: Soft, nontender, no distention.  Skin: Intact without lesions or rashes.  Neurologic: Alert and oriented x 3.  Psych: Normal affect.  Extremities: No clubbing or cyanosis.  HEENT: Normal.    ECG: Most recent ECG reviewed.      ASSESSMENT AND PLAN: 1. CAD s/p BMS to RCA: Symptomatically stable. Continue ASA, Coreg 3.125 mg q pm and Lipitor 20 mg.   2. Atrial fibrillation/flutter: In sinus rhythm today with PAC's. Symptomatically stable from this standpoint. Warfarin was discontinued given his bladder cancer. His CHADS-Vasc score is 4, thus warranting anticoagulation as he is at high risk for a CVA. In addition to this, malignancy is associated with a hypercoagulable state. However, given the aforementioned diagnosis of bladder cancer with prior hematuria, I will hold off on reinstituting warfarin and will continue ASA, albeit of unclear benefit and primarily for CAD secondary prevention.   3. Essential hypertension: Controlled. No changes.  4. Bladder cancer: s/p resection on 01/20/15.  Dispo: fu 1 yr  Kate Sable, M.D., F.A.C.C.

## 2016-07-27 NOTE — Patient Instructions (Signed)

## 2016-11-02 DIAGNOSIS — D509 Iron deficiency anemia, unspecified: Secondary | ICD-10-CM | POA: Diagnosis not present

## 2016-11-02 DIAGNOSIS — I10 Essential (primary) hypertension: Secondary | ICD-10-CM | POA: Diagnosis not present

## 2016-11-02 DIAGNOSIS — R7301 Impaired fasting glucose: Secondary | ICD-10-CM | POA: Diagnosis not present

## 2016-11-03 DIAGNOSIS — Z6826 Body mass index (BMI) 26.0-26.9, adult: Secondary | ICD-10-CM | POA: Diagnosis not present

## 2016-11-03 DIAGNOSIS — I10 Essential (primary) hypertension: Secondary | ICD-10-CM | POA: Diagnosis not present

## 2016-11-03 DIAGNOSIS — L570 Actinic keratosis: Secondary | ICD-10-CM | POA: Diagnosis not present

## 2016-11-03 DIAGNOSIS — M353 Polymyalgia rheumatica: Secondary | ICD-10-CM | POA: Diagnosis not present

## 2016-11-03 DIAGNOSIS — Z0000 Encounter for general adult medical examination without abnormal findings: Secondary | ICD-10-CM | POA: Diagnosis not present

## 2016-11-03 DIAGNOSIS — R7301 Impaired fasting glucose: Secondary | ICD-10-CM | POA: Diagnosis not present

## 2016-11-03 DIAGNOSIS — I482 Chronic atrial fibrillation: Secondary | ICD-10-CM | POA: Diagnosis not present

## 2016-11-03 DIAGNOSIS — C679 Malignant neoplasm of bladder, unspecified: Secondary | ICD-10-CM | POA: Diagnosis not present

## 2016-12-07 DIAGNOSIS — C670 Malignant neoplasm of trigone of bladder: Secondary | ICD-10-CM | POA: Diagnosis not present

## 2016-12-07 DIAGNOSIS — N200 Calculus of kidney: Secondary | ICD-10-CM | POA: Diagnosis not present

## 2016-12-07 DIAGNOSIS — R350 Frequency of micturition: Secondary | ICD-10-CM | POA: Diagnosis not present

## 2016-12-07 DIAGNOSIS — R310 Gross hematuria: Secondary | ICD-10-CM | POA: Diagnosis not present

## 2017-03-06 ENCOUNTER — Encounter (HOSPITAL_COMMUNITY): Payer: Self-pay | Admitting: Emergency Medicine

## 2017-03-06 ENCOUNTER — Emergency Department (HOSPITAL_COMMUNITY)
Admission: EM | Admit: 2017-03-06 | Discharge: 2017-03-06 | Disposition: A | Discharge status: Home / Self Care | Payer: Medicare Other | Attending: Emergency Medicine | Admitting: Emergency Medicine

## 2017-03-06 ENCOUNTER — Emergency Department (HOSPITAL_COMMUNITY): Payer: Medicare Other

## 2017-03-06 DIAGNOSIS — S199XXA Unspecified injury of neck, initial encounter: Secondary | ICD-10-CM | POA: Diagnosis not present

## 2017-03-06 DIAGNOSIS — Z7982 Long term (current) use of aspirin: Secondary | ICD-10-CM | POA: Insufficient documentation

## 2017-03-06 DIAGNOSIS — R531 Weakness: Secondary | ICD-10-CM | POA: Diagnosis not present

## 2017-03-06 DIAGNOSIS — Y9389 Activity, other specified: Secondary | ICD-10-CM | POA: Diagnosis not present

## 2017-03-06 DIAGNOSIS — S0990XA Unspecified injury of head, initial encounter: Secondary | ICD-10-CM | POA: Diagnosis not present

## 2017-03-06 DIAGNOSIS — Z955 Presence of coronary angioplasty implant and graft: Secondary | ICD-10-CM | POA: Diagnosis not present

## 2017-03-06 DIAGNOSIS — Z96653 Presence of artificial knee joint, bilateral: Secondary | ICD-10-CM | POA: Diagnosis not present

## 2017-03-06 DIAGNOSIS — Z87891 Personal history of nicotine dependence: Secondary | ICD-10-CM | POA: Insufficient documentation

## 2017-03-06 DIAGNOSIS — W0110XA Fall on same level from slipping, tripping and stumbling with subsequent striking against unspecified object, initial encounter: Secondary | ICD-10-CM | POA: Insufficient documentation

## 2017-03-06 DIAGNOSIS — W19XXXA Unspecified fall, initial encounter: Secondary | ICD-10-CM

## 2017-03-06 DIAGNOSIS — Y990 Civilian activity done for income or pay: Secondary | ICD-10-CM | POA: Diagnosis not present

## 2017-03-06 DIAGNOSIS — Y9289 Other specified places as the place of occurrence of the external cause: Secondary | ICD-10-CM | POA: Diagnosis not present

## 2017-03-06 DIAGNOSIS — Z79899 Other long term (current) drug therapy: Secondary | ICD-10-CM | POA: Diagnosis not present

## 2017-03-06 DIAGNOSIS — I2510 Atherosclerotic heart disease of native coronary artery without angina pectoris: Secondary | ICD-10-CM | POA: Diagnosis not present

## 2017-03-06 DIAGNOSIS — R739 Hyperglycemia, unspecified: Secondary | ICD-10-CM | POA: Diagnosis not present

## 2017-03-06 DIAGNOSIS — Z8551 Personal history of malignant neoplasm of bladder: Secondary | ICD-10-CM | POA: Insufficient documentation

## 2017-03-06 LAB — BASIC METABOLIC PANEL
ANION GAP: 8 (ref 5–15)
BUN: 16 mg/dL (ref 6–20)
CALCIUM: 8.9 mg/dL (ref 8.9–10.3)
CO2: 26 mmol/L (ref 22–32)
Chloride: 104 mmol/L (ref 101–111)
Creatinine, Ser: 0.84 mg/dL (ref 0.61–1.24)
GLUCOSE: 170 mg/dL — AB (ref 65–99)
Potassium: 4 mmol/L (ref 3.5–5.1)
Sodium: 138 mmol/L (ref 135–145)

## 2017-03-06 LAB — CBC WITH DIFFERENTIAL/PLATELET
BASOS ABS: 0 10*3/uL (ref 0.0–0.1)
BASOS PCT: 0 %
EOS ABS: 0.3 10*3/uL (ref 0.0–0.7)
EOS PCT: 3 %
HCT: 39.8 % (ref 39.0–52.0)
Hemoglobin: 13.3 g/dL (ref 13.0–17.0)
Lymphocytes Relative: 13 %
Lymphs Abs: 1 10*3/uL (ref 0.7–4.0)
MCH: 30.2 pg (ref 26.0–34.0)
MCHC: 33.4 g/dL (ref 30.0–36.0)
MCV: 90.5 fL (ref 78.0–100.0)
MONO ABS: 0.6 10*3/uL (ref 0.1–1.0)
Monocytes Relative: 8 %
Neutro Abs: 5.6 10*3/uL (ref 1.7–7.7)
Neutrophils Relative %: 76 %
PLATELETS: 161 10*3/uL (ref 150–400)
RBC: 4.4 MIL/uL (ref 4.22–5.81)
RDW: 13.6 % (ref 11.5–15.5)
WBC: 7.5 10*3/uL (ref 4.0–10.5)

## 2017-03-06 LAB — URINALYSIS, ROUTINE W REFLEX MICROSCOPIC
Bacteria, UA: NONE SEEN
Bilirubin Urine: NEGATIVE
Hgb urine dipstick: NEGATIVE
KETONES UR: NEGATIVE mg/dL
LEUKOCYTES UA: NEGATIVE
NITRITE: NEGATIVE
PH: 5 (ref 5.0–8.0)
Protein, ur: 30 mg/dL — AB
SQUAMOUS EPITHELIAL / LPF: NONE SEEN
Specific Gravity, Urine: 1.02 (ref 1.005–1.030)

## 2017-03-06 NOTE — ED Triage Notes (Signed)
Patient states he tripped and fell today, hitting the left side of his face on the gravel. Patient has small laceration and abrasion noted to left side of face. No bleeding noted at this time. Patient states he is not currently on blood thinners. Denies LOC. States he wears glasses and thinks the glasses cut his face when he fell.

## 2017-03-06 NOTE — Discharge Instructions (Signed)
Your CT scan today is ok with no sign of internal injury.  Your blood glucose is elevated as discussed and you need to have this rechecked by Dr. [name].  Call for an appointment to have this rechecked early in the morning before you have had breakfast.

## 2017-03-07 NOTE — ED Provider Notes (Signed)
[facility] DEPT Provider Note   CSN: [id number] Arrival date & time: 03/06/17  1300     History   Chief Complaint Chief Complaint  Patient presents with  . Fall    HPI Manuel Le is a 83 y.o. male with a past medical history as outlined below presenting with a fall which occurred around 10 am today.  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  He denies loc, focal weakness, n/v dizziness but does endorse drowsiness and mild headache. He has had no treatment prior to arrival.  He is not on blood thinners and does not know his tetanus status.  He does have complaint of increasing weakness in his legs, and has discussed with his pcp with no diagnosis determined at this time.  Reports fell 3 weeks ago in his basement and had to get his nephew to help him up.  He was able to get himself up today by pulling up on the side of the trailer, then was able to walk to the house.  He lives alone with his "forgetful" wife. He presents with his brother in law.  The history is provided by the patient and a relative.    Past Medical History:  Diagnosis Date  . Arthritis    OSTEO  . Atrial fibrillation and flutter ([place])   . Bladder cancer ([place])   . Coronary artery disease   . Dysrhythmia   . Exertional angina ([place]) 07/08/2014  . H/O urinary frequency   . Hematuria   . Hypertension   . Iron deficiency anemia   . Kidney stones    hx of   . Mild obstructive sleep apnea    no cpap  . S/P coronary artery stent placement 07/24/2014  . Weakness     Patient Active Problem List   Diagnosis Date Noted  . Pressure ulcer 09/14/2015  . Zenker's diverticulum 09/09/2015  . Bladder cancer ([place]) 07/14/2015  . CAD (coronary artery disease) 07/24/2014  . S/P coronary artery stent placement (RCA bare metal stent on 07/08/14) 07/24/2014  . Exertional angina  ([place]) 07/08/2014  . Encounter for therapeutic drug monitoring 09/12/2013  . Atrial fibrillation ([place]) 09/08/2013  . HTN (hypertension) 09/08/2013  . Fatigue 09/08/2013  . Anemia 06/26/2012    Past Surgical History:  Procedure Laterality Date  . CARDIAC CATHETERIZATION    . CARDIOVASCULAR STRESS TEST  12-15-2013  DR Kate Sable ([place])   MILD - MODERATE PERI-INFARCT ISCHEMIA  OF INFERIOR WALL, MID-INFEROSEPTAL WALL, MID-INFEROLATERAL WALL, & BASAL INFERIOR WALL/   NORMAL LVF /  EF 59%/  INTERMEDIATE RISK STUDY  . COLONOSCOPY  07/04/2012   Procedure: COLONOSCOPY;  Surgeon: Rogene Houston, MD;  Location: [facility];  Service: Endoscopy;  Laterality: N/A;  320  . CORONARY STENT PLACEMENT  07/08/2014   PTCA of RCA   DR COOPER  . CYSTOSCOPY W/ RETROGRADES Bilateral 12/31/2013   Procedure: CYSTOSCOPY WITH RETROGRADE PYELOGRAM;  Surgeon: Alexis Frock, MD;  Location: [facility] OR[facility];  Service: Urology;  Laterality: Bilateral;  . CYSTOSCOPY W/ RETROGRADES Bilateral 01/20/2015   Procedure: CYSTOSCOPY WITH RETROGRADE PYELOGRAM;  Surgeon: Alexis Frock, MD;  Location: [facility];  Service: Urology;  Laterality: Bilateral;  . ESOPHAGOGASTRODUODENOSCOPY  07/04/2012   Procedure: ESOPHAGOGASTRODUODENOSCOPY (EGD);  Surgeon: Rogene Houston, MD;  Location: [facility];  Service: Endoscopy;  Laterality: N/A;  . EYE SURGERY     past cataract surgery bilat  . HERNIA REPAIR Right   . LEFT HEART CATHETERIZATION WITH CORONARY ANGIOGRAM N/A 07/08/2014   Procedure: LEFT HEART CATHETERIZATION WITH CORONARY ANGIOGRAM;  Surgeon: Blane Ohara, MD;  Location: [facility] CATH LAB;  Service: Cardiovascular;  Laterality: N/A;  . PERCUTANEOUS CORONARY STENT INTERVENTION (PCI-S)  07/08/2014   Procedure: PERCUTANEOUS CORONARY STENT INTERVENTION (PCI-S);  Surgeon: Blane Ohara, MD;  Location: [facility] CATH LAB;  Service: Cardiovascular;;  . TOTAL KNEE ARTHROPLASTY Bilateral 2000  &  2003?  . TRANSTHORACIC  ECHOCARDIOGRAM  09-11-2013   MODERATE LVH/  EF 55-60%/  MILD MR & TR/  MILD TO MODERATE CALCIFIED AV WITHOUT STENOSIS/  MILD LAE/   MODERATE PR  . TRANSURETHRAL RESECTION OF BLADDER TUMOR N/A 01/20/2015   Procedure: TRANSURETHRAL RESECTION OF BLADDER TUMOR (TURBT);  Surgeon: Alexis Frock, MD;  Location: [facility];  Service: Urology;  Laterality: N/A;  . TRANSURETHRAL RESECTION OF BLADDER TUMOR WITH GYRUS (TURBT-GYRUS) N/A 12/31/2013   Procedure: TRANSURETHRAL RESECTION OF BLADDER TUMOR WITH GYRUS (TURBT-GYRUS);  Surgeon: Alexis Frock, MD;  Location: [facility];  Service: Urology;  Laterality: N/A;  . ZENKER'S DIVERTICULECTOMY N/A 09/09/2015   Procedure: ENDOSCOPIC ZENKER'S DIVERTICULECTOMY;  Surgeon: Izora Gala, MD;  Location: [facility];  Service: ENT;  Laterality: N/A;  . ZENKER'S DIVERTICULECTOMY ENDOSCOPIC  09/09/2015       Home Medications    Prior to Admission medications   Medication Sig Start Date End Date Taking? Authorizing Provider  amLODipine (NORVASC) 10 MG tablet 1 tablet (10 mg total) by Per NG tube route daily. 09/15/15   Izora Gala, MD  aspirin 325 MG tablet 1 tablet (325 mg total) by Per NG tube route daily. 09/15/15   Izora Gala, MD  atorvastatin (LIPITOR) 20 MG tablet 1 tablet (20 mg total) by Per NG tube route daily at 6 PM. 09/15/15   Izora Gala, MD  carvedilol (COREG) 3.125 MG tablet 1 tablet (3.125 mg total) by Per NG tube route 2 (two) times daily with a meal. 09/15/15   Izora Gala, MD  Cholecalciferol (VITAMIN D-3) 1000 units CAPS 1 capsule (1,000 Units total) by Per NG tube route daily. 09/15/15   Izora Gala, MD  ferrous sulfate 325 (65 FE) MG EC tablet Take 325 mg by mouth daily with breakfast.    [provider]  HYDROcodone-acetaminophen (HYCET) 7.5-325 mg/15 ml solution Take 15 mLs by mouth 4 (four) times daily as needed for moderate pain. 09/09/15   Izora Gala, MD  HYDROcodone-acetaminophen (HYCET) 7.5-325 mg/15 ml solution Place 10-15 mLs into feeding  tube every 4 (four) hours as needed for moderate pain. 09/15/15   Izora Gala, MD  losartan (COZAAR) 100 MG tablet 1 tablet (100 mg total) by Per NG tube route daily. 09/15/15   Izora Gala, MD  Misc Natural Products (OSTEO BI-FLEX ADV JOINT SHIELD) TABS Take 1 tablet by mouth 2 (two) times daily.     [provider]  Multiple Vitamins-Minerals (CENTRUM SILVER ADULT 50+) TABS Take 1 tablet by mouth every morning.    [provider]  nitroGLYCERIN (NITROSTAT) 0.4 MG SL tablet Place 1 tablet (0.4 mg total) under the tongue every 5 (five) minutes as needed for chest pain. 07/03/14   Herminio Commons, MD  Nutritional Supplements (FEEDING SUPPLEMENT, JEVITY 1.2 CAL,) LIQD Place 1,000 mLs into feeding tube continuous. 09/15/15   Izora Gala, MD  promethazine (PHENERGAN) 25 MG  suppository Place 1 suppository (25 mg total) rectally every 6 (six) hours as needed for nausea. 09/15/15   Izora Gala, MD  vitamin B-12 (CYANOCOBALAMIN) 1000 MCG tablet 1 tablet (1,000 mcg total) by Per NG tube route daily. 09/15/15   Izora Gala, MD    Family History History reviewed. No pertinent family history.  Social History Social History  Substance Use Topics  . Smoking status: Former Smoker    Packs/day: 2.00    Years: 20.00    Types: Cigarettes, Cigars, Pipe    Quit date: 08/21/1968  . Smokeless tobacco: Never Used     Comment: quit smoking in 1980  . Alcohol use No     Allergies   Patient has no known allergies.   Review of Systems Review of Systems  Constitutional: Negative for fever.  HENT: Negative for congestion and sore throat.   Eyes: Negative.   Respiratory: Negative for chest tightness and shortness of breath.   Cardiovascular: Negative for chest pain.  Gastrointestinal: Negative for abdominal pain, nausea and vomiting.  Genitourinary: Negative.   Musculoskeletal: Positive for arthralgias. Negative for joint swelling and neck pain.  Skin: Positive for wound. Negative for  rash.  Neurological: Positive for weakness and headaches. Negative for dizziness, light-headedness and numbness.  Psychiatric/Behavioral: Negative.  Negative for confusion.     Physical Exam Updated Vital Signs BP (!) 178/90 (BP Location: Right Arm)   Pulse 70   Temp 97.7 F (36.5 C) (Oral)   Resp 18   Ht 6' (1.829 m)   Wt 88.5 kg (195 lb)   SpO2 100%   BMI 26.45 kg/m   Physical Exam  Constitutional: He is oriented to person, place, and time. He appears well-developed and well-nourished.  HENT:  Head: Normocephalic.  Abrasion left temple, hemostatic  Eyes: Pupils are equal, round, and reactive to light. Conjunctivae and EOM are normal.  Neck: Normal range of motion.  Cardiovascular: Normal rate, regular rhythm, normal heart sounds and intact distal pulses.   Pulmonary/Chest: Effort normal and breath sounds normal. He has no wheezes.  Abdominal: Soft. Bowel sounds are normal.  Musculoskeletal: Normal range of motion.  Neurological: He is alert and oriented to person, place, and time. He has normal strength. No cranial nerve deficit. Gait abnormal.  Equal grip stength.  No foot drop.  Pt ambulated in room.  Posture with lumbar in forward flexion, keeps knees slightly bent.  Skin: Skin is warm and dry.  Abrasion left temple. Hemostatic.  Psychiatric: He has a normal mood and affect.  Nursing note and vitals reviewed.    ED Treatments / Results  Labs (all labs ordered are listed, but only abnormal results are displayed) Labs Reviewed  BASIC METABOLIC PANEL - Abnormal; Notable for the following:       Result Value   Glucose, Bld 170 (*)    All other components within normal limits  URINALYSIS, ROUTINE W REFLEX MICROSCOPIC - Abnormal; Notable for the following:    Glucose, UA >=500 (*)    Protein, ur 30 (*)    All other components within normal limits  CBC WITH DIFFERENTIAL/PLATELET    EKG  EKG Interpretation None       Radiology Ct Head Wo Contrast  Result  Date: 03/06/2017 CLINICAL DATA:  Patient tripped and fell today, hitting the left side of face. No loss of consciousness. EXAM: CT HEAD WITHOUT CONTRAST CT CERVICAL SPINE WITHOUT CONTRAST TECHNIQUE: Multidetector CT imaging of the head and cervical spine was performed following the  standard protocol without intravenous contrast. Multiplanar CT image reconstructions of the cervical spine were also generated. COMPARISON:  Prior examinations 06/09/2014. FINDINGS: CT HEAD FINDINGS Brain: There is no evidence of acute intracranial hemorrhage, mass lesion, brain edema or extra-axial fluid collection. There is stable mild prominence of the ventricles and subarachnoid spaces consistent with mild atrophy for age. Mild chronic small vessel ischemic changes in the periventricular white matter and basal ganglia are stable. There is no CT evidence of acute cortical infarction. Vascular: Intracranial vascular calcifications are present. Skull: Negative for fracture or focal lesion. Sinuses/Orbits: Ethmoid sinus mucosal thickening and a right maxillary sinus mucous retention cysts are similar to the previous study. The visualized mastoid air cells and middle ears are clear. There is mild periorbital soft tissue swelling on the left. Other: None. CT CERVICAL SPINE FINDINGS Alignment: Near anatomic. There is a mild convex right scoliosis. There is a slight degenerative anterolisthesis at C4-5 and C5-6. Skull base and vertebrae: No evidence of acute fracture or traumatic subluxation. There is multilevel spondylosis with disc space loss and uncinate spurring greatest at the C3-4 and C6-7 levels. Facet degenerative changes are present, greatest at C4-5. The left C2-3 facet joint appears ankylosed. Soft tissues and spinal canal: No prevertebral fluid or swelling. No visible canal hematoma. Disc levels: No acute findings are evident. There is multilevel spondylosis contributing to mild foraminal narrowing, greatest on the left at C5-6.  Upper chest: Unremarkable. Other: Carotid atherosclerosis noted bilaterally. IMPRESSION: 1. Mild left periorbital soft tissue swelling. No acute intracranial or calvarial findings. 2. Stable chronic paranasal sinus disease, atrophy and periventricular white matter disease. 3. No evidence of acute cervical spine fracture, traumatic subluxation or static signs of instability. 4. Spondylosis as described. Electronically Signed   By: Richardean Sale M.D.   On: 03/06/2017 14:45   Ct Cervical Spine Wo Contrast  Result Date: 03/06/2017 CLINICAL DATA:  Patient tripped and fell today, hitting the left side of face. No loss of consciousness. EXAM: CT HEAD WITHOUT CONTRAST CT CERVICAL SPINE WITHOUT CONTRAST TECHNIQUE: Multidetector CT imaging of the head and cervical spine was performed following the standard protocol without intravenous contrast. Multiplanar CT image reconstructions of the cervical spine were also generated. COMPARISON:  Prior examinations 06/09/2014. FINDINGS: CT HEAD FINDINGS Brain: There is no evidence of acute intracranial hemorrhage, mass lesion, brain edema or extra-axial fluid collection. There is stable mild prominence of the ventricles and subarachnoid spaces consistent with mild atrophy for age. Mild chronic small vessel ischemic changes in the periventricular white matter and basal ganglia are stable. There is no CT evidence of acute cortical infarction. Vascular: Intracranial vascular calcifications are present. Skull: Negative for fracture or focal lesion. Sinuses/Orbits: Ethmoid sinus mucosal thickening and a right maxillary sinus mucous retention cysts are similar to the previous study. The visualized mastoid air cells and middle ears are clear. There is mild periorbital soft tissue swelling on the left. Other: None. CT CERVICAL SPINE FINDINGS Alignment: Near anatomic. There is a mild convex right scoliosis. There is a slight degenerative anterolisthesis at C4-5 and C5-6. Skull base and  vertebrae: No evidence of acute fracture or traumatic subluxation. There is multilevel spondylosis with disc space loss and uncinate spurring greatest at the C3-4 and C6-7 levels. Facet degenerative changes are present, greatest at C4-5. The left C2-3 facet joint appears ankylosed. Soft tissues and spinal canal: No prevertebral fluid or swelling. No visible canal hematoma. Disc levels: No acute findings are evident. There is multilevel spondylosis contributing to mild  foraminal narrowing, greatest on the left at C5-6. Upper chest: Unremarkable. Other: Carotid atherosclerosis noted bilaterally. IMPRESSION: 1. Mild left periorbital soft tissue swelling. No acute intracranial or calvarial findings. 2. Stable chronic paranasal sinus disease, atrophy and periventricular white matter disease. 3. No evidence of acute cervical spine fracture, traumatic subluxation or static signs of instability. 4. Spondylosis as described. Electronically Signed   By: Richardean Sale M.D.   On: 03/06/2017 14:45    Procedures Procedures (including critical care time)  Medications Ordered in ED Medications - No data to display   Initial Impression / Assessment and Plan / ED Course  I have reviewed the triage vital signs and the nursing notes.  Pertinent labs & imaging results that were available during my care of the patient were reviewed by me and considered in my medical decision making (see chart for details).    Pt with fall and increased perceived weakness in legs.  CT imaging negative for acute injury today.  Discussed options including home health/ social work/ RN home eval , including considering PT to improve strength.  PT deferred at this time.  Offered walker prescription, pt also deferred.  [place] encouraged close f/u with pcp for further eval of weakness.  Also discussed elevated cbg and glucose in his urine.  He needs a fasting blood glucose.  He will call his pcp to arrange this.  Brother in law at bedside  understands and will assist with this f/u. Tetanus is current per chart. Abrasion left temple cleaned.  Pt was seen by Dr Laverta Baltimore during todays visit.  Final Clinical Impressions(s) / ED Diagnoses   Final diagnoses:  Injury of head, initial encounter  Fall, initial encounter  Generalized weakness  Hyperglycemia    New Prescriptions Discharge Medication List as of 03/06/2017  4:39 PM       Evalee Jefferson, PA-C 03/07/17 7209    Long, W[facility], MD 03/07/17 1036

## 2017-03-19 DIAGNOSIS — Z6825 Body mass index (BMI) 25.0-25.9, adult: Secondary | ICD-10-CM | POA: Diagnosis not present

## 2017-03-19 DIAGNOSIS — Z9119 Patient's noncompliance with other medical treatment and regimen: Secondary | ICD-10-CM | POA: Diagnosis not present

## 2017-03-19 DIAGNOSIS — I10 Essential (primary) hypertension: Secondary | ICD-10-CM | POA: Diagnosis not present

## 2017-03-19 DIAGNOSIS — S0081XA Abrasion of other part of head, initial encounter: Secondary | ICD-10-CM | POA: Diagnosis not present

## 2017-05-08 DIAGNOSIS — E119 Type 2 diabetes mellitus without complications: Secondary | ICD-10-CM | POA: Diagnosis not present

## 2017-05-08 DIAGNOSIS — I10 Essential (primary) hypertension: Secondary | ICD-10-CM | POA: Diagnosis not present

## 2017-05-08 DIAGNOSIS — E782 Mixed hyperlipidemia: Secondary | ICD-10-CM | POA: Diagnosis not present

## 2017-05-16 DIAGNOSIS — C679 Malignant neoplasm of bladder, unspecified: Secondary | ICD-10-CM | POA: Diagnosis not present

## 2017-05-16 DIAGNOSIS — I2510 Atherosclerotic heart disease of native coronary artery without angina pectoris: Secondary | ICD-10-CM | POA: Diagnosis not present

## 2017-05-16 DIAGNOSIS — L570 Actinic keratosis: Secondary | ICD-10-CM | POA: Diagnosis not present

## 2017-05-16 DIAGNOSIS — Z6825 Body mass index (BMI) 25.0-25.9, adult: Secondary | ICD-10-CM | POA: Diagnosis not present

## 2017-05-16 DIAGNOSIS — I10 Essential (primary) hypertension: Secondary | ICD-10-CM | POA: Diagnosis not present

## 2017-05-16 DIAGNOSIS — M353 Polymyalgia rheumatica: Secondary | ICD-10-CM | POA: Diagnosis not present

## 2017-05-16 DIAGNOSIS — Z23 Encounter for immunization: Secondary | ICD-10-CM | POA: Diagnosis not present

## 2017-05-16 DIAGNOSIS — I4891 Unspecified atrial fibrillation: Secondary | ICD-10-CM | POA: Diagnosis not present

## 2017-06-18 DIAGNOSIS — I482 Chronic atrial fibrillation: Secondary | ICD-10-CM | POA: Diagnosis not present

## 2017-06-18 DIAGNOSIS — Z8551 Personal history of malignant neoplasm of bladder: Secondary | ICD-10-CM | POA: Diagnosis not present

## 2017-06-18 DIAGNOSIS — I10 Essential (primary) hypertension: Secondary | ICD-10-CM | POA: Diagnosis not present

## 2017-06-26 DIAGNOSIS — R350 Frequency of micturition: Secondary | ICD-10-CM | POA: Diagnosis not present

## 2017-06-26 DIAGNOSIS — C670 Malignant neoplasm of trigone of bladder: Secondary | ICD-10-CM | POA: Diagnosis not present

## 2017-08-07 ENCOUNTER — Ambulatory Visit: Payer: Medicare Other | Admitting: Cardiovascular Disease

## 2017-08-07 ENCOUNTER — Ambulatory Visit (INDEPENDENT_AMBULATORY_CARE_PROVIDER_SITE_OTHER): Payer: Medicare Other | Admitting: Cardiovascular Disease

## 2017-08-07 ENCOUNTER — Encounter: Payer: Self-pay | Admitting: Cardiovascular Disease

## 2017-08-07 VITALS — BP 130/64 | HR 65 | Ht 72.0 in | Wt 202.0 lb

## 2017-08-07 DIAGNOSIS — I25118 Atherosclerotic heart disease of native coronary artery with other forms of angina pectoris: Secondary | ICD-10-CM | POA: Diagnosis not present

## 2017-08-07 DIAGNOSIS — I10 Essential (primary) hypertension: Secondary | ICD-10-CM

## 2017-08-07 DIAGNOSIS — C671 Malignant neoplasm of dome of bladder: Secondary | ICD-10-CM

## 2017-08-07 DIAGNOSIS — I209 Angina pectoris, unspecified: Secondary | ICD-10-CM

## 2017-08-07 DIAGNOSIS — I480 Paroxysmal atrial fibrillation: Secondary | ICD-10-CM | POA: Diagnosis not present

## 2017-08-07 DIAGNOSIS — Z955 Presence of coronary angioplasty implant and graft: Secondary | ICD-10-CM | POA: Diagnosis not present

## 2017-08-07 NOTE — Progress Notes (Signed)
SUBJECTIVE: The patient presents for annual follow-up.  He underwent bare-metal stent placement to the ostium of the RCA on 07/08/14.  He also has a history of atrial fibrillation and flutter. He underwent transurethral resection for bladder tumor recurrence on 01/20/15.   He is feeling well.  He denies chest pain, palpitations, shortness of breath, and syncope.  He also denies hematuria.  He has had falls and walks with a cane.  ECG performed today which I personally interpreted demonstrated sinus rhythm with right bundle branch block with PACs and PVCs.   Review of Systems: As per "subjective", otherwise negative.  No Known Allergies  Current Outpatient Medications  Medication Sig Dispense Refill  . amLODipine (NORVASC) 10 MG tablet 1 tablet (10 mg total) by Per NG tube route daily. 30 tablet 0  . aspirin 325 MG tablet 1 tablet (325 mg total) by Per NG tube route daily. 30 tablet 0  . atorvastatin (LIPITOR) 20 MG tablet 1 tablet (20 mg total) by Per NG tube route daily at 6 PM. 30 tablet 0  . carvedilol (COREG) 3.125 MG tablet 1 tablet (3.125 mg total) by Per NG tube route 2 (two) times daily with a meal. 60 tablet 0  . Cholecalciferol (VITAMIN D-3) 1000 units CAPS 1 capsule (1,000 Units total) by Per NG tube route daily. 60 capsule 0  . ferrous sulfate 325 (65 FE) MG EC tablet Take 325 mg by mouth daily with breakfast.    . HYDROcodone-acetaminophen (HYCET) 7.5-325 mg/15 ml solution Take 15 mLs by mouth 4 (four) times daily as needed for moderate pain. 473 mL 0  . HYDROcodone-acetaminophen (HYCET) 7.5-325 mg/15 ml solution Place 10-15 mLs into feeding tube every 4 (four) hours as needed for moderate pain. 120 mL 0  . losartan (COZAAR) 100 MG tablet 1 tablet (100 mg total) by Per NG tube route daily. 30 tablet 0  . Misc Natural Products (OSTEO BI-FLEX ADV JOINT SHIELD) TABS Take 1 tablet by mouth 2 (two) times daily.     . Multiple Vitamins-Minerals (CENTRUM SILVER ADULT 50+) TABS  Take 1 tablet by mouth every morning.    . nitroGLYCERIN (NITROSTAT) 0.4 MG SL tablet Place 1 tablet (0.4 mg total) under the tongue every 5 (five) minutes as needed for chest pain. 25 tablet 3  . Nutritional Supplements (FEEDING SUPPLEMENT, JEVITY 1.2 CAL,) LIQD Place 1,000 mLs into feeding tube continuous. 1000 mL 90  . promethazine (PHENERGAN) 25 MG suppository Place 1 suppository (25 mg total) rectally every 6 (six) hours as needed for nausea. 12 each 0  . vitamin B-12 (CYANOCOBALAMIN) 1000 MCG tablet 1 tablet (1,000 mcg total) by Per NG tube route daily. 30 tablet 0   No current facility-administered medications for this visit.     Past Medical History:  Diagnosis Date  . Arthritis    OSTEO  . Atrial fibrillation and flutter ([place])   . Bladder cancer ([place])   . Coronary artery disease   . Dysrhythmia   . Exertional angina ([place]) 07/08/2014  . H/O urinary frequency   . Hematuria   . Hypertension   . Iron deficiency anemia   . Kidney stones    hx of   . Mild obstructive sleep apnea    no cpap  . S/P coronary artery stent placement 07/24/2014  . Weakness     Past Surgical History:  Procedure Laterality Date  . CARDIAC CATHETERIZATION    . CARDIOVASCULAR STRESS TEST  12-15-2013  DR Jamesetta So  [facility])   MILD - MODERATE PERI-INFARCT ISCHEMIA  OF INFERIOR WALL, MID-INFEROSEPTAL WALL, MID-INFEROLATERAL WALL, & BASAL INFERIOR WALL/   NORMAL LVF /  EF 59%/  INTERMEDIATE RISK STUDY  . COLONOSCOPY  07/04/2012   Procedure: COLONOSCOPY;  Surgeon: Rogene Houston, MD;  [facility];  Service: Endoscopy;  Laterality: N/A;  320  . CORONARY STENT PLACEMENT  07/08/2014   PTCA of RCA   DR COOPER  . CYSTOSCOPY W/ RETROGRADES Bilateral 12/31/2013   Procedure: CYSTOSCOPY WITH RETROGRADE PYELOGRAM;  Surgeon: Alexis Frock, MD;  [facility];  Service: Urology;  Laterality: Bilateral;  . CYSTOSCOPY W/ RETROGRADES Bilateral 01/20/2015   Procedure: CYSTOSCOPY WITH  RETROGRADE PYELOGRAM;  Surgeon: Alexis Frock, MD;  [facility];  Service: Urology;  Laterality: Bilateral;  . ESOPHAGOGASTRODUODENOSCOPY  07/04/2012   Procedure: ESOPHAGOGASTRODUODENOSCOPY (EGD);  Surgeon: Rogene Houston, MD;  [facility];  Service: Endoscopy;  Laterality: N/A;  . EYE SURGERY     past cataract surgery bilat  . HERNIA REPAIR Right   . LEFT HEART CATHETERIZATION WITH CORONARY ANGIOGRAM N/A 07/08/2014   Procedure: LEFT HEART CATHETERIZATION WITH CORONARY ANGIOGRAM;  Surgeon: Blane Ohara, MD;  Location: [facility] CATH LAB;  Service: Cardiovascular;  Laterality: N/A;  . PERCUTANEOUS CORONARY STENT INTERVENTION (PCI-S)  07/08/2014   Procedure: PERCUTANEOUS CORONARY STENT INTERVENTION (PCI-S);  Surgeon: Blane Ohara, MD;  Location: [facility] CATH LAB;  Service: Cardiovascular;;  . TOTAL KNEE ARTHROPLASTY Bilateral 2000  &  2003?  . TRANSTHORACIC ECHOCARDIOGRAM  09-11-2013   MODERATE LVH/  EF 55-60%/  MILD MR & TR/  MILD TO MODERATE CALCIFIED AV WITHOUT STENOSIS/  MILD LAE/   MODERATE PR  . TRANSURETHRAL RESECTION OF BLADDER TUMOR N/A 01/20/2015   Procedure: TRANSURETHRAL RESECTION OF BLADDER TUMOR (TURBT);  Surgeon: Alexis Frock, MD;  [facility];  Service: Urology;  Laterality: N/A;  . TRANSURETHRAL RESECTION OF BLADDER TUMOR WITH GYRUS (TURBT-GYRUS) N/A 12/31/2013   Procedure: TRANSURETHRAL RESECTION OF BLADDER TUMOR WITH GYRUS (TURBT-GYRUS);  Surgeon: Alexis Frock, MD;  [facility];  Service: Urology;  Laterality: N/A;  . ZENKER'S DIVERTICULECTOMY N/A 09/09/2015   Procedure: ENDOSCOPIC ZENKER'S DIVERTICULECTOMY;  Surgeon: Izora Gala, MD;  Location: [place];  Service: ENT;  Laterality: N/A;  . ZENKER'S DIVERTICULECTOMY ENDOSCOPIC  09/09/2015    Social History   Socioeconomic History  . Marital status: Married    Spouse name: Not on file  . Number of children: Not on file  . Years of education: Not on file  . Highest education level: Not on file    Social Needs  . Financial resource strain: Not on file  . Food insecurity - worry: Not on file  . Food insecurity - inability: Not on file  . Transportation needs - medical: Not on file  . Transportation needs - non-medical: Not on file  Occupational History  . Not on file  Tobacco Use  . Smoking status: Former Smoker    Packs/day: 2.00    Years: 20.00    Pack years: 40.00    Types: Cigarettes, Cigars, Pipe    Last attempt to quit: 08/21/1968    Years since quitting: 48.9  . Smokeless tobacco: Never Used  . Tobacco comment: quit smoking in 1980  Substance and Sexual Activity  . Alcohol use: No    Alcohol/week: 0.0 oz  . Drug use: No  . Sexual activity: Not on file  Other Topics Concern  . Not on file  Social History Narrative  . Not on file     Vitals:   08/07/17 1427  BP: 130/64  Pulse: 65  SpO2: 98%  Weight: 202 lb (91.6 kg)  Height: 6' (1.829 m)    Wt Readings from Last 3 Encounters:  08/07/17 202 lb (91.6 kg)  03/06/17 195 lb (88.5 kg)  07/27/16 212 lb (96.2 kg)     PHYSICAL EXAM General: NAD HEENT: Normal. Neck: No JVD, no thyromegaly. Lungs: Clear to auscultation bilaterally with normal respiratory effort. CV: Regular rate and rhythm, normal S1/S2, no S3/S4, II/VI ejection systolic murmur at RUSB and II/VI apical holosystolic murmur. No pretibial or periankle edema.   Abdomen: Soft, nontender, no distention.  Neurologic: Alert and oriented.  Psych: Normal affect. Skin: Normal. Musculoskeletal: No gross deformities.    ECG: Most recent ECG reviewed.   Labs: Lab Results  Component Value Date/Time   K 4.0 03/06/2017 02:52 PM   BUN 16 03/06/2017 02:52 PM   CREATININE 0.84 03/06/2017 02:52 PM   HGB 13.3 03/06/2017 02:52 PM     Lipids: No results found for: LDLCALC, LDLDIRECT, CHOL, TRIG, HDL     ASSESSMENT AND PLAN: 1. CAD s/p BMS to RCA: Symptomatically stable. Continue ASA, Coreg 3.125 mg bid, and Lipitor 20 mg.   2. Atrial  fibrillation/flutter: Symptomatically stable.  ECG shows sinus rhythm with PACs and PVCs today.  Warfarin was previously discontinued due to bladder cancer and issues with hematuria.  He does have a history of frequent falls and walks with a cane.  I will not pursue the reinitiation of anticoagulation.  He remains on aspirin primarily for secondary prevention of coronary artery disease.  3. Essential hypertension: Controlled. No changes.  4. Bladder cancer: s/p resection on 01/20/15.  No further issues with hematuria.    Disposition: Follow up 1 yr   Kate Sable, M.D., F.A.C.C.

## 2017-08-07 NOTE — Patient Instructions (Signed)

## 2017-08-27 DIAGNOSIS — I10 Essential (primary) hypertension: Secondary | ICD-10-CM | POA: Diagnosis not present

## 2017-08-27 DIAGNOSIS — R7301 Impaired fasting glucose: Secondary | ICD-10-CM | POA: Diagnosis not present

## 2017-08-27 DIAGNOSIS — D509 Iron deficiency anemia, unspecified: Secondary | ICD-10-CM | POA: Diagnosis not present

## 2017-08-29 DIAGNOSIS — M353 Polymyalgia rheumatica: Secondary | ICD-10-CM | POA: Diagnosis not present

## 2017-08-29 DIAGNOSIS — I2510 Atherosclerotic heart disease of native coronary artery without angina pectoris: Secondary | ICD-10-CM | POA: Diagnosis not present

## 2017-08-29 DIAGNOSIS — R531 Weakness: Secondary | ICD-10-CM | POA: Diagnosis not present

## 2017-08-29 DIAGNOSIS — C679 Malignant neoplasm of bladder, unspecified: Secondary | ICD-10-CM | POA: Diagnosis not present

## 2017-08-29 DIAGNOSIS — L570 Actinic keratosis: Secondary | ICD-10-CM | POA: Diagnosis not present

## 2017-08-29 DIAGNOSIS — Z6828 Body mass index (BMI) 28.0-28.9, adult: Secondary | ICD-10-CM | POA: Diagnosis not present

## 2017-08-29 DIAGNOSIS — I10 Essential (primary) hypertension: Secondary | ICD-10-CM | POA: Diagnosis not present

## 2017-08-29 DIAGNOSIS — R7301 Impaired fasting glucose: Secondary | ICD-10-CM | POA: Diagnosis not present

## 2017-08-29 DIAGNOSIS — I4891 Unspecified atrial fibrillation: Secondary | ICD-10-CM | POA: Diagnosis not present

## 2017-09-18 ENCOUNTER — Other Ambulatory Visit: Payer: Self-pay

## 2017-09-18 ENCOUNTER — Encounter (HOSPITAL_COMMUNITY): Payer: Self-pay | Admitting: Physical Therapy

## 2017-09-18 ENCOUNTER — Ambulatory Visit (HOSPITAL_COMMUNITY): Payer: Medicare Other | Attending: Internal Medicine | Admitting: Physical Therapy

## 2017-09-18 DIAGNOSIS — M6281 Muscle weakness (generalized): Secondary | ICD-10-CM | POA: Insufficient documentation

## 2017-09-18 DIAGNOSIS — M2560 Stiffness of unspecified joint, not elsewhere classified: Secondary | ICD-10-CM

## 2017-09-18 DIAGNOSIS — R29898 Other symptoms and signs involving the musculoskeletal system: Secondary | ICD-10-CM | POA: Diagnosis not present

## 2017-09-18 DIAGNOSIS — R2689 Other abnormalities of gait and mobility: Secondary | ICD-10-CM | POA: Insufficient documentation

## 2017-09-18 NOTE — Therapy (Signed)
[place], [place], [address] Phone: [phone]   Fax:  [phone]  Physical Therapy Evaluation  Patient Details  Name: Manuel Le MRN: [id number] Date of Birth: 1926/10/03 Referring Provider: Dr. Jenny Reichmann "[name]" [name]   Encounter Date: 09/18/2017  PT End of Session - 09/18/17 1845    Visit Number  1    Number of Visits  17    Date for PT Re-Evaluation  10/18/17    Authorization Type  Medicare     Authorization Time Period  09/18/17 - 11/16/17    Authorization - Visit Number  1    Authorization - Number of Visits  10    PT Start Time  6962    PT Stop Time  1517    PT Time Calculation (min)  44 min    Equipment Utilized During Treatment  Gait belt    Activity Tolerance  Patient tolerated treatment well;No increased pain    Behavior During Therapy  WFL for tasks assessed/performed       Past Medical History:  Diagnosis Date  . Arthritis    OSTEO  . Atrial fibrillation and flutter ([place])   . Bladder cancer ([place])   . Coronary artery disease   . Dysrhythmia   . Exertional angina ([place]) 07/08/2014  . H/O urinary frequency   . Hematuria   . Hypertension   . Iron deficiency anemia   . Kidney stones    hx of   . Mild obstructive sleep apnea    no cpap  . S/P coronary artery stent placement 07/24/2014  . Weakness     Past Surgical History:  Procedure Laterality Date  . CARDIAC CATHETERIZATION    . CARDIOVASCULAR STRESS TEST  12-15-2013  DR Kate Sable ([place])   MILD - MODERATE PERI-INFARCT ISCHEMIA  OF INFERIOR WALL, MID-INFEROSEPTAL WALL, MID-INFEROLATERAL WALL, & BASAL INFERIOR WALL/   NORMAL LVF /  EF 59%/  INTERMEDIATE RISK STUDY  . COLONOSCOPY  07/04/2012   Procedure: COLONOSCOPY;  Surgeon: Rogene Houston, MD;  [facility];  Service: Endoscopy;  Laterality: N/A;  320  . CORONARY STENT PLACEMENT  07/08/2014   PTCA of RCA   DR COOPER  . CYSTOSCOPY W/ RETROGRADES Bilateral  12/31/2013   Procedure: CYSTOSCOPY WITH RETROGRADE PYELOGRAM;  Surgeon: Alexis Frock, MD;  [facility];  Service: Urology;  Laterality: Bilateral;  . CYSTOSCOPY W/ RETROGRADES Bilateral 01/20/2015   Procedure: CYSTOSCOPY WITH RETROGRADE PYELOGRAM;  Surgeon: Alexis Frock, MD;  [facility];  Service: Urology;  Laterality: Bilateral;  . ESOPHAGOGASTRODUODENOSCOPY  07/04/2012   Procedure: ESOPHAGOGASTRODUODENOSCOPY (EGD);  Surgeon: Rogene Houston, MD;  [facility];  Service: Endoscopy;  Laterality: N/A;  . EYE SURGERY     past cataract surgery bilat  . HERNIA REPAIR Right   . LEFT HEART CATHETERIZATION WITH CORONARY ANGIOGRAM N/A 07/08/2014   Procedure: LEFT HEART CATHETERIZATION WITH CORONARY ANGIOGRAM;  Surgeon: Blane Ohara, MD;  Location: [facility] CATH LAB;  Service: Cardiovascular;  Laterality: N/A;  . PERCUTANEOUS CORONARY STENT INTERVENTION (PCI-S)  07/08/2014   Procedure: PERCUTANEOUS CORONARY STENT INTERVENTION (PCI-S);  Surgeon: Blane Ohara, MD;  Location: [facility] CATH LAB;  Service: Cardiovascular;;  . TOTAL KNEE ARTHROPLASTY Bilateral 2000  &  2003?  . TRANSTHORACIC ECHOCARDIOGRAM  09-11-2013   MODERATE LVH/  EF 55-60%/  MILD MR & TR/  MILD TO MODERATE CALCIFIED AV WITHOUT STENOSIS/  MILD LAE/   MODERATE PR  .  TRANSURETHRAL RESECTION OF BLADDER TUMOR N/A 01/20/2015   Procedure: TRANSURETHRAL RESECTION OF BLADDER TUMOR (TURBT);  Surgeon: Alexis Frock, MD;  [facility];  Service: Urology;  Laterality: N/A;  . TRANSURETHRAL RESECTION OF BLADDER TUMOR WITH GYRUS (TURBT-GYRUS) N/A 12/31/2013   Procedure: TRANSURETHRAL RESECTION OF BLADDER TUMOR WITH GYRUS (TURBT-GYRUS);  Surgeon: Alexis Frock, MD;  [facility];  Service: Urology;  Laterality: N/A;  . ZENKER'S DIVERTICULECTOMY N/A 09/09/2015   Procedure: ENDOSCOPIC ZENKER'S DIVERTICULECTOMY;  Surgeon: Izora Gala, MD;  Location: [place];  Service: ENT;  Laterality: N/A;  . ZENKER'S DIVERTICULECTOMY ENDOSCOPIC   09/09/2015    There were no vitals filed for this visit.   Subjective Assessment - 09/18/17 1442    Subjective  Patient reported that about 3 years he was diagnosed with bladder cancer. Since then he has noticed that he doesn't really have much strength in his legs and he stated that he cannot stand up for longer than 5 minutes. Patient reported that his bladder cancer is ongoing and that he is being treated for it. Patient reports he has been falling a lot and stated that he has fallen 7-8 times in the last 6 months. Patient reported that about 6 months ago he had a fall in which he fell and "messed up his glasses", he went to the emergency room and imaging was negative. Patient reported that he lives home with a single story house with a basement with railing and 12 steps. Patient reports that he has had 2 knee replacements; one in each lower extremity approximately 15 years ago. Patient denied any pain in his legs currently, but stated that he got pain in his legs last night which was a 6/10. Patient denied any current night sweats, patient stated that he has urinary urgency since his cancer diagnosis, but patient denied any tingling or numbness currently or any saddle paresthesia.     Pertinent History  Bladder cancer; Dysphagia; cardiac arrhythmia; hypertension; Atrial fibrillation; bilateral knee replacements    Limitations  Standing;Walking;House hold activities    How long can you sit comfortably?  Not limited    How long can you stand comfortably?  5 minutes; legs give out legs hurt     How long can you walk comfortably?  20 feet; feels like legs might give out    Diagnostic tests  CT of head on 03/06/17: Spondylosis greatest on left C5-C6; negative for acute intracranial findings    Patient Stated Goals  To improve strength in legs and balance    Currently in Pain?  No/denies    Multiple Pain Sites  No         [facility] PT Assessment - 09/18/17 0001      Assessment   Medical Diagnosis   Weakness in Left Leg, strength and balance training    Referring Provider  Dr. Jenny Reichmann "[name]" Langley      Balance Screen   Has the patient fallen in the past 6 months  Yes    How many times?  7 or 8    Has the patient had a decrease in activity level because of a fear of falling?   Yes    Is the patient reluctant to leave their home because of a fear of falling?   No      Prior Function   Level of Independence  Independent      Cognition   Overall Cognitive Status  Within Functional Limits for tasks assessed  Observation/Other Assessments   Focus on Therapeutic Outcomes (FOTO)   37% (63% limitation)      Sensation   Additional Comments  Patient denied any tingling or numbness in lower extremities      Posture/Postural Control   Posture/Postural Control  Postural limitations    Postural Limitations  Anterior pelvic tilt;Flexed trunk    Posture Comments  -- Anterior weight shift with ambulation and in standing       Tone   Assessment Location  Right Lower Extremity;Left Lower Extremity      AROM   Right/Left Knee  Right;Left    Right Knee Extension  5    Right Knee Flexion  115    Left Knee Extension  9    Left Knee Flexion  113      Strength   Right/Left Hip  Right;Left    Right Hip Flexion  3/5    Right Hip Extension  --    Right Hip ABduction  4/5    Left Hip Flexion  3-/5    Left Hip Extension  --    Left Hip ABduction  4/5    Right/Left Knee  Right;Left    Right Knee Flexion  4/5    Right Knee Extension  4+/5    Left Knee Flexion  3+/5    Left Knee Extension  3/5    Right/Left Ankle  Right;Left    Right Ankle Dorsiflexion  4+/5    Right Ankle Plantar Flexion  4+/5    Left Ankle Dorsiflexion  4+/5    Left Ankle Plantar Flexion  4+/5      Flexibility   Hamstrings  Severely limited bilaterally with 90/90 hamstring assessment      Transfers   Comments  Patient required increased time for all bed mobility; with sit to stand transfer patient required use of  bilateral upper extremities and several attempts      Ambulation/Gait   Ambulation/Gait  Yes    Ambulation Distance (Feet)  20 Feet    Assistive device  Small based quad cane    Gait Pattern  Decreased step length - right;Decreased step length - left;Decreased stance time - right;Decreased stance time - left;Decreased stride length;Decreased hip/knee flexion - right;Decreased hip/knee flexion - left    Ambulation Surface  Level    Gait velocity  Below age-related norm    Gait Comments  Patient demonstrated forward trunk lean; patient ambulated from examination room to the bathroom and requested to sit down after standing for 30 seconds       Berg Balance Test   Sit to Stand  --      Timed Up and Go Test   TUG  Normal TUG    Normal TUG (seconds)  31    TUG Comments  Patient performed with quad cane and with use of upper extremities to stand      RLE Tone   RLE Tone  -- Quadricep reflexes 2+; difficult to assess PT guarding      LLE Tone   LLE Tone  -- Quadricep reflexes 1+; difficult to assess PT guarding             Objective measurements completed on examination: See above findings.              PT Education - 09/18/17 1845    Education provided  Yes    Education Details  Patient was educated on examination findings and POC.     Person(s) Educated  Patient    Methods  Explanation    Comprehension  Verbalized understanding       PT Short Term Goals - 09/18/17 1850      PT SHORT TERM GOAL #1   Title  Patient will demonstrate understanding a report regular compliance with home exercise program.     Time  4    Period  Weeks    Status  New    Target Date  10/16/17      PT SHORT TERM GOAL #2   Title  Patient will demonstrate improved MMT score by 1/2 grade in all deficient planes of lower extremities in order to assist with functional mobility.     Baseline  Patient has weakness in bilateral lower extremities.     Time  4    Period  Weeks    Status  New     Target Date  10/16/17      PT SHORT TERM GOAL #3   Title  Patient will report ability to stand for 10 minutes in order to assist with improved performance of functional actitivities at home.     Baseline  Patient reports that he feels he cannot stand for more than 5 minutes currently.     Time  4    Period  Weeks    Status  New    Target Date  10/16/17      PT SHORT TERM GOAL #4   Title  Patient will report pain in legs no greater than 4/10 over the course of a 1 week period indicating improved activity tolerance and improved quality of life.     Baseline  Patient reported no pain currently, but a 6/10 pain in lower extremities yesterday.     Time  4    Period  Weeks    Status  New    Target Date  10/16/17        PT Long Term Goals - 09/18/17 1857      PT LONG TERM GOAL #1   Title  Patient will demonstrate improved MMT score by 1 grade in all deficient planes of lower extremities in order to assist with functional mobility.     Baseline  Patient has strength deficits in bilateral lower extremities.     Time  8    Period  Weeks    Status  New    Target Date  11/13/17      PT LONG TERM GOAL #2   Title  Patient's FOTO score will improve by 15% indicating patient's improved perceived functional ability and better tolerance of functional activities.     Baseline  Patient's current score is 37% function and is 63% limited.     Time  8    Period  Weeks    Status  New    Target Date  11/13/17      PT LONG TERM GOAL #3   Title  Patient will demonstrate ability to perform TUG in 14 seconds or less indicating a decreased risk of falls.     Baseline  Patient performed TUG in 31 seconds this session while using a quad cane and upper extremity assistance to stand up from chair.     Time  8    Period  Weeks    Status  New    Target Date  11/13/17             Plan - 09/18/17 1902    Clinical Impression Statement  Patient is  a pleasant 84 year old male who presented to  physical therapy today with complaints of lower extremity weakness, the left side more than the right side and balance difficulties. Patient demonstrated decreased lower extremity strength in bilateral lower extremities with left lower extremity being weaker than the right lower extremity. Patient demonstrated decreased knee active range of motion in bilateral knees with left lower extremity more limited than the right lower extremity. Patient stated that he was unsure what medications he was taking at this time, the note from Wende Neighbors MD indicated patient was taking Finasteride 5 MG Tabs "one po qd" and losartan 100 mg oral tablet "one tablet by mouth daily". Patient demonstrated decreased balance as he performed a TUG in 31 seconds this session with use of upper extremities to stand and using a quad cane, indicating that he is at an increased risk of falling. Patient's FOTO score was found to be 37% or 63% limited. Patient reported that he has difficulty with standing for more than 5 minutes and for walking distances greater than 20 feet because he feels like his legs might give out. Patient was limited severely in hamstring flexibility bilaterally. Patient demonstrated decreased quadriceps reflex of +1 on the left compared to 2+ on the right lower extremity. Patient was assessed for clonus in bilateral ankles and none was present. Patient demonstrated need for increased time with all transitions and unsteadiness during gait and with sit to stands. Patient would benefit from skilled physical therapy in order to address the abovementioned deficits and improve the patient's overall functional mobility.     History and Personal Factors relevant to plan of care:  Bladder cancer; bilateral knee replacements; spondylosis at C5-C6; frequent falls; hypertension; atrial fibrillation    Clinical Presentation  Stable    Clinical Presentation due to:  FOTO score; TUG >15 seconds; MMT; clinical judgement     Clinical  Decision Making  Low    Rehab Potential  Fair    Clinical Impairments Affecting Rehab Potential  Positve: Patient's motivation, patient's positive attitude. Negative: Chronicity of issue; frequent falls; medical complexity     PT Frequency  2x / week    PT Duration  8 weeks    PT Treatment/Interventions  ADLs/Self Care Home Management;Aquatic Therapy;DME Instruction;Gait training;Stair training;Functional mobility training;Therapeutic activities;Therapeutic exercise;Balance training;Neuromuscular re-education;Patient/family education;Manual techniques;Passive range of motion;Energy conservation    PT Next Visit Plan  Review evaluation; create HEP; test hip extension strength; follow up about medications; perform functional strengthening for lower extremities such as sit to stand and test stair ambulation; trial ambulation with rolling walker; continue to assess patient's lower extremity tone    Consulted and Agree with Plan of Care  Patient       Patient will benefit from skilled therapeutic intervention in order to improve the following deficits and impairments:  Abnormal gait, Decreased balance, Decreased endurance, Decreased mobility, Difficulty walking, Hypomobility, Decreased range of motion, Improper body mechanics, Impaired tone, Decreased activity tolerance, Decreased coordination, Decreased strength, Impaired flexibility, Postural dysfunction, Pain  Visit Diagnosis: Weakness of left leg  Muscle weakness (generalized)  Decreased range of motion  Balance problem  Other abnormalities of gait and mobility     Problem List Patient Active Problem List   Diagnosis Date Noted  . Pressure ulcer 09/14/2015  . Zenker's diverticulum 09/09/2015  . Bladder cancer ([place]) 07/14/2015  . CAD (coronary artery disease) 07/24/2014  . S/P coronary artery stent placement (RCA bare metal stent on 07/08/14) 07/24/2014  . Exertional angina ([facility])  07/08/2014  . Encounter for therapeutic drug  monitoring 09/12/2013  . Atrial fibrillation ([place]) 09/08/2013  . HTN (hypertension) 09/08/2013  . Fatigue 09/08/2013  . Anemia 06/26/2012   Clarene Critchley PT, DPT 7:43 PM, 09/18/17 [place], [place], [address] Phone: [phone]   Fax:  [phone]  Name: Manuel Le MRN: [id number] Date of Birth: [birth year]

## 2017-09-21 ENCOUNTER — Ambulatory Visit (HOSPITAL_COMMUNITY): Payer: Medicare Other

## 2017-09-21 ENCOUNTER — Telehealth (HOSPITAL_COMMUNITY): Payer: Self-pay | Admitting: Internal Medicine

## 2017-09-21 NOTE — Telephone Encounter (Signed)
09/21/17  pt called to say he needed to cx thinks he has the flu

## 2017-09-22 DIAGNOSIS — J069 Acute upper respiratory infection, unspecified: Secondary | ICD-10-CM | POA: Diagnosis not present

## 2017-09-22 DIAGNOSIS — R05 Cough: Secondary | ICD-10-CM | POA: Diagnosis not present

## 2017-09-25 ENCOUNTER — Telehealth (HOSPITAL_COMMUNITY): Payer: Self-pay | Admitting: Internal Medicine

## 2017-09-25 ENCOUNTER — Ambulatory Visit (HOSPITAL_COMMUNITY): Payer: Medicare Other | Admitting: Physical Therapy

## 2017-09-25 ENCOUNTER — Inpatient Hospital Stay (HOSPITAL_COMMUNITY): Payer: Medicare Other

## 2017-09-25 ENCOUNTER — Inpatient Hospital Stay (HOSPITAL_COMMUNITY)
Admission: EM | Admit: 2017-09-25 | Discharge: 2017-09-28 | DRG: 871 | Disposition: A | Discharge status: Home / Self Care | Payer: Medicare Other | Attending: Internal Medicine | Admitting: Internal Medicine

## 2017-09-25 ENCOUNTER — Emergency Department (HOSPITAL_COMMUNITY): Payer: Medicare Other

## 2017-09-25 ENCOUNTER — Encounter (HOSPITAL_COMMUNITY): Payer: Self-pay

## 2017-09-25 DIAGNOSIS — Z515 Encounter for palliative care: Secondary | ICD-10-CM | POA: Diagnosis not present

## 2017-09-25 DIAGNOSIS — G4733 Obstructive sleep apnea (adult) (pediatric): Secondary | ICD-10-CM | POA: Diagnosis present

## 2017-09-25 DIAGNOSIS — R531 Weakness: Secondary | ICD-10-CM | POA: Diagnosis not present

## 2017-09-25 DIAGNOSIS — M1990 Unspecified osteoarthritis, unspecified site: Secondary | ICD-10-CM | POA: Diagnosis present

## 2017-09-25 DIAGNOSIS — R945 Abnormal results of liver function studies: Secondary | ICD-10-CM | POA: Diagnosis present

## 2017-09-25 DIAGNOSIS — K7689 Other specified diseases of liver: Secondary | ICD-10-CM | POA: Diagnosis not present

## 2017-09-25 DIAGNOSIS — R251 Tremor, unspecified: Secondary | ICD-10-CM | POA: Diagnosis not present

## 2017-09-25 DIAGNOSIS — Z87891 Personal history of nicotine dependence: Secondary | ICD-10-CM

## 2017-09-25 DIAGNOSIS — J40 Bronchitis, not specified as acute or chronic: Secondary | ICD-10-CM | POA: Diagnosis present

## 2017-09-25 DIAGNOSIS — J690 Pneumonitis due to inhalation of food and vomit: Secondary | ICD-10-CM | POA: Diagnosis present

## 2017-09-25 DIAGNOSIS — J9601 Acute respiratory failure with hypoxia: Secondary | ICD-10-CM

## 2017-09-25 DIAGNOSIS — N390 Urinary tract infection, site not specified: Secondary | ICD-10-CM | POA: Diagnosis present

## 2017-09-25 DIAGNOSIS — Z66 Do not resuscitate: Secondary | ICD-10-CM | POA: Diagnosis present

## 2017-09-25 DIAGNOSIS — J1100 Influenza due to unidentified influenza virus with unspecified type of pneumonia: Secondary | ICD-10-CM

## 2017-09-25 DIAGNOSIS — A4189 Other specified sepsis: Principal | ICD-10-CM | POA: Diagnosis present

## 2017-09-25 DIAGNOSIS — R05 Cough: Secondary | ICD-10-CM | POA: Diagnosis not present

## 2017-09-25 DIAGNOSIS — I10 Essential (primary) hypertension: Secondary | ICD-10-CM | POA: Diagnosis present

## 2017-09-25 DIAGNOSIS — I4891 Unspecified atrial fibrillation: Secondary | ICD-10-CM | POA: Diagnosis present

## 2017-09-25 DIAGNOSIS — I2510 Atherosclerotic heart disease of native coronary artery without angina pectoris: Secondary | ICD-10-CM | POA: Diagnosis present

## 2017-09-25 DIAGNOSIS — A419 Sepsis, unspecified organism: Secondary | ICD-10-CM | POA: Diagnosis present

## 2017-09-25 DIAGNOSIS — I6789 Other cerebrovascular disease: Secondary | ICD-10-CM | POA: Diagnosis not present

## 2017-09-25 DIAGNOSIS — I25118 Atherosclerotic heart disease of native coronary artery with other forms of angina pectoris: Secondary | ICD-10-CM | POA: Diagnosis not present

## 2017-09-25 DIAGNOSIS — Z8551 Personal history of malignant neoplasm of bladder: Secondary | ICD-10-CM

## 2017-09-25 DIAGNOSIS — R0602 Shortness of breath: Secondary | ICD-10-CM | POA: Diagnosis not present

## 2017-09-25 DIAGNOSIS — N3000 Acute cystitis without hematuria: Secondary | ICD-10-CM

## 2017-09-25 DIAGNOSIS — R509 Fever, unspecified: Secondary | ICD-10-CM

## 2017-09-25 DIAGNOSIS — J101 Influenza due to other identified influenza virus with other respiratory manifestations: Secondary | ICD-10-CM | POA: Diagnosis not present

## 2017-09-25 DIAGNOSIS — Z955 Presence of coronary angioplasty implant and graft: Secondary | ICD-10-CM | POA: Diagnosis not present

## 2017-09-25 DIAGNOSIS — I4510 Unspecified right bundle-branch block: Secondary | ICD-10-CM | POA: Diagnosis present

## 2017-09-25 DIAGNOSIS — I480 Paroxysmal atrial fibrillation: Secondary | ICD-10-CM | POA: Diagnosis not present

## 2017-09-25 DIAGNOSIS — J1000 Influenza due to other identified influenza virus with unspecified type of pneumonia: Secondary | ICD-10-CM | POA: Diagnosis present

## 2017-09-25 DIAGNOSIS — R000 Tachycardia, unspecified: Secondary | ICD-10-CM

## 2017-09-25 DIAGNOSIS — R7989 Other specified abnormal findings of blood chemistry: Secondary | ICD-10-CM | POA: Diagnosis not present

## 2017-09-25 DIAGNOSIS — Z79899 Other long term (current) drug therapy: Secondary | ICD-10-CM | POA: Diagnosis not present

## 2017-09-25 LAB — COMPREHENSIVE METABOLIC PANEL
ALBUMIN: 3.7 g/dL (ref 3.5–5.0)
ALT: 33 U/L (ref 17–63)
AST: 45 U/L — AB (ref 15–41)
Alkaline Phosphatase: 50 U/L (ref 38–126)
Anion gap: 16 — ABNORMAL HIGH (ref 5–15)
BUN: 23 mg/dL — AB (ref 6–20)
CO2: 22 mmol/L (ref 22–32)
Calcium: 9.3 mg/dL (ref 8.9–10.3)
Chloride: 101 mmol/L (ref 101–111)
Creatinine, Ser: 0.89 mg/dL (ref 0.61–1.24)
GFR calc Af Amer: >60 mL/min (ref >60)
GLUCOSE: 133 mg/dL — AB (ref 65–99)
POTASSIUM: 4.1 mmol/L (ref 3.5–5.1)
Sodium: 139 mmol/L (ref 135–145)
Total Bilirubin: 1.1 mg/dL (ref 0.3–1.2)
Total Protein: 7.8 g/dL (ref 6.5–8.1)

## 2017-09-25 LAB — URINALYSIS, ROUTINE W REFLEX MICROSCOPIC
Bilirubin Urine: NEGATIVE
GLUCOSE, UA: NEGATIVE mg/dL
Ketones, ur: 5 mg/dL — AB
Nitrite: NEGATIVE
PH: 5 (ref 5.0–8.0)
Protein, ur: 100 mg/dL — AB
Specific Gravity, Urine: 1.02 (ref 1.005–1.030)

## 2017-09-25 LAB — CBC WITH DIFFERENTIAL/PLATELET
Basophils Absolute: 0 10*3/uL (ref 0.0–0.1)
Basophils Relative: 0 %
EOS PCT: 0 %
Eosinophils Absolute: 0 10*3/uL (ref 0.0–0.7)
HEMATOCRIT: 42.1 % (ref 39.0–52.0)
Hemoglobin: 13.8 g/dL (ref 13.0–17.0)
LYMPHS ABS: 0.4 10*3/uL — AB (ref 0.7–4.0)
LYMPHS PCT: 3 %
MCH: 30.5 pg (ref 26.0–34.0)
MCHC: 32.8 g/dL (ref 30.0–36.0)
MCV: 93.1 fL (ref 78.0–100.0)
Monocytes Absolute: 0.8 10*3/uL (ref 0.1–1.0)
Monocytes Relative: 8 %
Neutro Abs: 9.7 10*3/uL — ABNORMAL HIGH (ref 1.7–7.7)
Neutrophils Relative %: 89 %
Platelets: 159 10*3/uL (ref 150–400)
RBC: 4.52 MIL/uL (ref 4.22–5.81)
RDW: 13.4 % (ref 11.5–15.5)
WBC: 11 10*3/uL — AB (ref 4.0–10.5)

## 2017-09-25 LAB — TROPONIN I: Troponin I: 0.03 ng/mL (ref <0.03)

## 2017-09-25 LAB — INFLUENZA PANEL BY PCR (TYPE A & B)
INFLBPCR: NEGATIVE
Influenza A By PCR: POSITIVE — AB

## 2017-09-25 LAB — I-STAT CG4 LACTIC ACID, ED: LACTIC ACID, VENOUS: 1.29 mmol/L (ref 0.5–1.9)

## 2017-09-25 LAB — D-DIMER, QUANTITATIVE (NOT AT ARMC): D DIMER QUANT: 1.92 ug{FEU}/mL — AB (ref 0.00–0.50)

## 2017-09-25 MED ORDER — OSELTAMIVIR PHOSPHATE 75 MG PO CAPS
75.0000 mg | ORAL_CAPSULE | Freq: Once | ORAL | Status: AC
Start: 2017-09-25 — End: 2017-09-25
  Administered 2017-09-25: 75 mg via ORAL
  Filled 2017-09-25: qty 1

## 2017-09-25 MED ORDER — DEXTROSE 5 % IV SOLN
500.0000 mg | INTRAVENOUS | Status: DC
  Filled 2017-09-25: qty 500

## 2017-09-25 MED ORDER — AZITHROMYCIN 500 MG IV SOLR
500.0000 mg | Freq: Once | INTRAVENOUS | Status: AC
  Administered 2017-09-25: 500 mg via INTRAVENOUS
  Filled 2017-09-25: qty 500

## 2017-09-25 MED ORDER — SODIUM CHLORIDE 0.9 % IV BOLUS (SEPSIS)
1000.0000 mL | Freq: Once | INTRAVENOUS | Status: AC
  Administered 2017-09-25: 1000 mL via INTRAVENOUS

## 2017-09-25 MED ORDER — IBUPROFEN 800 MG PO TABS
800.0000 mg | ORAL_TABLET | Freq: Once | ORAL | Status: AC
  Administered 2017-09-25: 800 mg via ORAL
  Filled 2017-09-25: qty 1

## 2017-09-25 MED ORDER — ACETAMINOPHEN 500 MG PO TABS
1000.0000 mg | ORAL_TABLET | Freq: Once | ORAL | Status: AC
  Administered 2017-09-25: 1000 mg via ORAL
  Filled 2017-09-25: qty 2

## 2017-09-25 MED ORDER — IPRATROPIUM-ALBUTEROL 0.5-2.5 (3) MG/3ML IN SOLN
3.0000 mL | Freq: Once | RESPIRATORY_TRACT | Status: AC
  Administered 2017-09-25: 3 mL via RESPIRATORY_TRACT
  Filled 2017-09-25: qty 3

## 2017-09-25 MED ORDER — IOPAMIDOL (ISOVUE-370) INJECTION 76%
100.0000 mL | Freq: Once | INTRAVENOUS | Status: AC | PRN
  Administered 2017-09-25: 100 mL via INTRAVENOUS

## 2017-09-25 MED ORDER — DEXTROSE 5 % IV SOLN
1.0000 g | Freq: Once | INTRAVENOUS | Status: AC
  Administered 2017-09-25: 1 g via INTRAVENOUS
  Filled 2017-09-25: qty 10

## 2017-09-25 MED ORDER — SODIUM CHLORIDE 0.9 % IV SOLN
1000.0000 mL | INTRAVENOUS | Status: DC
  Administered 2017-09-25 – 2017-09-28 (×2): 1000 mL via INTRAVENOUS

## 2017-09-25 NOTE — ED Provider Notes (Signed)
[facility] EMERGENCY DEPARTMENT Provider Note   CSN: [id number] Arrival date & time: 09/25/17  1406     History   Chief Complaint Chief Complaint  Patient presents with  . Tremors    HPI Manuel Le is a 84 y.o. male.  Pt presents to the ED today with tremors and sob.  The pt has been sick since 2/1.  He called his doctor who put him on antibiotics (?name).  He developed chills and tremors about an hour pta.  The pt has had fevers also.  The pt has been taking otc cold meds.  He is not improving, but is getting worse.      Past Medical History:  Diagnosis Date  . Arthritis    OSTEO  . Atrial fibrillation and flutter ([place])   . Bladder cancer ([place])   . Coronary artery disease   . Dysrhythmia   . Exertional angina ([place]) 07/08/2014  . H/O urinary frequency   . Hematuria   . Hypertension   . Iron deficiency anemia   . Kidney stones    hx of   . Mild obstructive sleep apnea    no cpap  . S/P coronary artery stent placement 07/24/2014  . Weakness     Patient Active Problem List   Diagnosis Date Noted  . Pressure ulcer 09/14/2015  . Zenker's diverticulum 09/09/2015  . Bladder cancer ([place]) 07/14/2015  . CAD (coronary artery disease) 07/24/2014  . S/P coronary artery stent placement (RCA bare metal stent on 07/08/14) 07/24/2014  . Exertional angina ([place]) 07/08/2014  . Encounter for therapeutic drug monitoring 09/12/2013  . Atrial fibrillation ([place]) 09/08/2013  . HTN (hypertension) 09/08/2013  . Fatigue 09/08/2013  . Anemia 06/26/2012    Past Surgical History:  Procedure Laterality Date  . CARDIAC CATHETERIZATION    . CARDIOVASCULAR STRESS TEST  12-15-2013  DR Kate Sable ([place])   MILD - MODERATE PERI-INFARCT ISCHEMIA  OF INFERIOR WALL, MID-INFEROSEPTAL WALL, MID-INFEROLATERAL WALL, & BASAL INFERIOR WALL/   NORMAL LVF /  EF 59%/  INTERMEDIATE RISK STUDY  . COLONOSCOPY  07/04/2012   Procedure: COLONOSCOPY;  Surgeon: Rogene Houston, MD;   Location: [facility];  Service: Endoscopy;  Laterality: N/A;  320  . CORONARY STENT PLACEMENT  07/08/2014   PTCA of RCA   DR COOPER  . CYSTOSCOPY W/ RETROGRADES Bilateral 12/31/2013   Procedure: CYSTOSCOPY WITH RETROGRADE PYELOGRAM;  Surgeon: Alexis Frock, MD;  Location: [facility] ORS;  Service: Urology;  Laterality: Bilateral;  . CYSTOSCOPY W/ RETROGRADES Bilateral 01/20/2015   Procedure: CYSTOSCOPY WITH RETROGRADE PYELOGRAM;  Surgeon: Alexis Frock, MD;  Location: [facility] OR[facility];  Service: Urology;  Laterality: Bilateral;  . ESOPHAGOGASTRODUODENOSCOPY  07/04/2012   Procedure: ESOPHAGOGASTRODUODENOSCOPY (EGD);  Surgeon: Rogene Houston, MD;  Location: [facility];  Service: Endoscopy;  Laterality: N/A;  . EYE SURGERY     past cataract surgery bilat  . HERNIA REPAIR Right   . LEFT HEART CATHETERIZATION WITH CORONARY ANGIOGRAM N/A 07/08/2014   Procedure: LEFT HEART CATHETERIZATION WITH CORONARY ANGIOGRAM;  Surgeon: Blane Ohara, MD;  Location: [facility] CATH LAB;  Service: Cardiovascular;  Laterality: N/A;  . PERCUTANEOUS CORONARY STENT INTERVENTION (PCI-S)  07/08/2014   Procedure: PERCUTANEOUS CORONARY STENT INTERVENTION (PCI-S);  Surgeon: Blane Ohara, MD;  Location: [facility] CATH LAB;  Service: Cardiovascular;;  . TOTAL KNEE ARTHROPLASTY Bilateral 2000  &  2003?  . TRANSTHORACIC ECHOCARDIOGRAM  09-11-2013   MODERATE LVH/  EF 55-60%/  MILD MR & TR/  MILD TO MODERATE CALCIFIED AV WITHOUT STENOSIS/  MILD LAE/   MODERATE PR  . TRANSURETHRAL RESECTION OF BLADDER TUMOR N/A 01/20/2015   Procedure: TRANSURETHRAL RESECTION OF BLADDER TUMOR (TURBT);  Surgeon: Alexis Frock, MD;  Location: [facility];  Service: Urology;  Laterality: N/A;  . TRANSURETHRAL RESECTION OF BLADDER TUMOR WITH GYRUS (TURBT-GYRUS) N/A 12/31/2013   Procedure: TRANSURETHRAL RESECTION OF BLADDER TUMOR WITH GYRUS (TURBT-GYRUS);  Surgeon: Alexis Frock, MD;  Location: [facility];  Service: Urology;  Laterality: N/A;  . ZENKER'S DIVERTICULECTOMY N/A  09/09/2015   Procedure: ENDOSCOPIC ZENKER'S DIVERTICULECTOMY;  Surgeon: Izora Gala, MD;  Location: [facility];  Service: ENT;  Laterality: N/A;  . ZENKER'S DIVERTICULECTOMY ENDOSCOPIC  09/09/2015       Home Medications    Prior to Admission medications   Medication Sig Start Date End Date Taking? Authorizing Provider  amLODipine (NORVASC) 10 MG tablet 1 tablet (10 mg total) by Per NG tube route daily. 09/15/15   Izora Gala, MD  aspirin 325 MG tablet 1 tablet (325 mg total) by Per NG tube route daily. 09/15/15   Izora Gala, MD  atorvastatin (LIPITOR) 20 MG tablet 1 tablet (20 mg total) by Per NG tube route daily at 6 PM. 09/15/15   Izora Gala, MD  carvedilol (COREG) 3.125 MG tablet 1 tablet (3.125 mg total) by Per NG tube route 2 (two) times daily with a meal. 09/15/15   Izora Gala, MD  Cholecalciferol (VITAMIN D-3) 1000 units CAPS 1 capsule (1,000 Units total) by Per NG tube route daily. 09/15/15   Izora Gala, MD  ferrous sulfate 325 (65 FE) MG EC tablet Take 325 mg by mouth daily with breakfast.    [provider]  HYDROcodone-acetaminophen (HYCET) 7.5-325 mg/15 ml solution Take 15 mLs by mouth 4 (four) times daily as needed for moderate pain. 09/09/15   Izora Gala, MD  HYDROcodone-acetaminophen (HYCET) 7.5-325 mg/15 ml solution Place 10-15 mLs into feeding tube every 4 (four) hours as needed for moderate pain. 09/15/15   Izora Gala, MD  losartan (COZAAR) 100 MG tablet 1 tablet (100 mg total) by Per NG tube route daily. 09/15/15   Izora Gala, MD  Misc Natural Products (OSTEO BI-FLEX ADV JOINT SHIELD) TABS Take 1 tablet by mouth 2 (two) times daily.     [provider]  Multiple Vitamins-Minerals (CENTRUM SILVER ADULT 50+) TABS Take 1 tablet by mouth every morning.    [provider]  nitroGLYCERIN (NITROSTAT) 0.4 MG SL tablet Place 1 tablet (0.4 mg total) under the tongue every 5 (five) minutes as needed for chest pain. 07/03/14   Herminio Commons, MD    Nutritional Supplements (FEEDING SUPPLEMENT, JEVITY 1.2 CAL,) LIQD Place 1,000 mLs into feeding tube continuous. 09/15/15   Izora Gala, MD  promethazine (PHENERGAN) 25 MG suppository Place 1 suppository (25 mg total) rectally every 6 (six) hours as needed for nausea. 09/15/15   Izora Gala, MD  vitamin B-12 (CYANOCOBALAMIN) 1000 MCG tablet 1 tablet (1,000 mcg total) by Per NG tube route daily. 09/15/15   Izora Gala, MD    Family History No family history on file.  Social History Social History   Tobacco Use  . Smoking status: Former Smoker    Packs/day: 2.00    Years: 20.00    Pack years: 40.00    Types: Cigarettes, Cigars, Pipe    Last attempt to quit: 08/21/1968    Years since quitting: 49.1  . Smokeless tobacco: Never Used  . Tobacco comment: quit smoking  in 1980  Substance Use Topics  . Alcohol use: No    Alcohol/week: 0.0 oz  . Drug use: No     Allergies   Patient has no known allergies.   Review of Systems Review of Systems  Constitutional: Positive for chills, fatigue and fever.  Respiratory: Positive for cough, shortness of breath and wheezing.   Neurological: Positive for tremors.     Physical Exam Updated Vital Signs BP (!) 201/90   Pulse (!) 104   Temp (!) 101.3 F (38.5 C) (Oral)   Resp (!) 25   SpO2 90%   Physical Exam  Constitutional: He is oriented to person, place, and time. He appears well-developed. He appears distressed.  HENT:  Head: Normocephalic and atraumatic.  Right Ear: External ear normal.  Left Ear: External ear normal.  Nose: Nose normal.  Mouth/Throat: Mucous membranes are dry.  Eyes: Conjunctivae and EOM are normal. Pupils are equal, round, and reactive to light.  Neck: Normal range of motion. Neck supple.  Cardiovascular: Regular rhythm, normal heart sounds and intact distal pulses. Tachycardia present.  Pulmonary/Chest: Tachypnea noted. He has wheezes. He has rhonchi. He has rales.  Abdominal: Soft. Bowel sounds are  normal.  Musculoskeletal: Normal range of motion.  Neurological: He is alert and oriented to person, place, and time.  Skin: Skin is warm and dry. Capillary refill takes less than 2 seconds.  Psychiatric: He has a normal mood and affect. His behavior is normal. Judgment and thought content normal.  Nursing note and vitals reviewed.    ED Treatments / Results  Labs (all labs ordered are listed, but only abnormal results are displayed) Labs Reviewed  COMPREHENSIVE METABOLIC PANEL - Abnormal; Notable for the following components:      Result Value   Glucose, Bld 133 (*)    BUN 23 (*)    AST 45 (*)    Anion gap 16 (*)    All other components within normal limits  CBC WITH DIFFERENTIAL/PLATELET - Abnormal; Notable for the following components:   WBC 11.0 (*)    Neutro Abs 9.7 (*)    Lymphs Abs 0.4 (*)    All other components within normal limits  URINALYSIS, ROUTINE W REFLEX MICROSCOPIC - Abnormal; Notable for the following components:   APPearance CLOUDY (*)    Hgb urine dipstick SMALL (*)    Ketones, ur 5 (*)    Protein, ur 100 (*)    Leukocytes, UA LARGE (*)    Bacteria, UA RARE (*)    Squamous Epithelial / LPF 0-5 (*)    All other components within normal limits  TROPONIN I - Abnormal; Notable for the following components:   Troponin I 0.03 (*)    All other components within normal limits  INFLUENZA PANEL BY PCR (TYPE A & B) - Abnormal; Notable for the following components:   Influenza A By PCR POSITIVE (*)    All other components within normal limits  CULTURE, BLOOD (ROUTINE X 2)  CULTURE, BLOOD (ROUTINE X 2)  URINE CULTURE  I-STAT CG4 LACTIC ACID, ED  I-STAT CG4 LACTIC ACID, ED    EKG  EKG Interpretation  Date/Time:  Tuesday September 25 2017 14:35:58 EST Ventricular Rate:  102 PR Interval:    QRS Duration: 133 QT Interval:  349 QTC Calculation: 455 R Axis:   -13 Text Interpretation:  Sinus tachycardia Right bundle branch block Minimal ST elevation, lateral  leads RBBB is new compared to last EKG in 2015 Confirmed by [place],  Almyra Free ([id number]) on 09/25/2017 2:48:32 PM       Radiology Dg Chest Port 1 View  Result Date: 09/25/2017 CLINICAL DATA:  Shortness of breath EXAM: PORTABLE CHEST 1 VIEW COMPARISON:  Chest x-ray of Dec 31, 2013 FINDINGS: The lungs are borderline hypoinflated. The interstitial markings are coarse though stable. The cardiac silhouette is mildly enlarged. The pulmonary vascularity is not engorged. There calcification in the wall of the aortic arch. The mediastinum is normal in width. There is no pleural effusion. IMPRESSION: Mild chronic bronchitic changes accentuated by borderline hypoinflation. No acute pneumonia nor CHF. Thoracic aortic atherosclerosis. Electronically Signed   By: David  Martinique M.D.   On: 09/25/2017 15:34    Procedures Procedures (including critical care time)  Medications Ordered in ED Medications  0.9 %  sodium chloride infusion (1,000 mLs Intravenous New Bag/Given 09/25/17 1525)  azithromycin (ZITHROMAX) 500 mg in dextrose 5 % 250 mL IVPB (500 mg Intravenous New Bag/Given 09/25/17 1610)  oseltamivir (TAMIFLU) capsule 75 mg (not administered)  cefTRIAXone (ROCEPHIN) 1 g in dextrose 5 % 50 mL IVPB (0 g Intravenous Stopped 09/25/17 1613)  ipratropium-albuterol (DUONEB) 0.5-2.5 (3) MG/3ML nebulizer solution 3 mL (3 mLs Nebulization Given 09/25/17 1527)  acetaminophen (TYLENOL) tablet 1,000 mg (1,000 mg Oral Given 09/25/17 1526)  ibuprofen (ADVIL,MOTRIN) tablet 800 mg (800 mg Oral Given 09/25/17 1526)  sodium chloride 0.9 % bolus 1,000 mL (1,000 mLs Intravenous New Bag/Given 09/25/17 1613)    And  sodium chloride 0.9 % bolus 1,000 mL (1,000 mLs Intravenous New Bag/Given 09/25/17 1612)    And  sodium chloride 0.9 % bolus 1,000 mL (1,000 mLs Intravenous New Bag/Given 09/25/17 1609)     Initial Impression / Assessment and Plan / ED Course  I have reviewed the triage vital signs and the nursing notes.  Pertinent labs & imaging  results that were available during my care of the patient were reviewed by me and considered in my medical decision making (see chart for details).   Pt was hypoxic, so he was placed on oxygen.  CRITICAL CARE Performed by: Isla Pence   Total critical care time: 30 minutes  Critical care time was exclusive of separately billable procedures and treating other patients.  Critical care was necessary to treat or prevent imminent or life-threatening deterioration.  Critical care was time spent personally by me on the following activities: development of treatment plan with patient and/or surrogate as well as nursing, discussions with consultants, evaluation of patient's response to treatment, examination of patient, obtaining history from patient or surrogate, ordering and performing treatments and interventions, ordering and review of laboratory studies, ordering and review of radiographic studies, pulse oximetry and re-evaluation of patient's condition.  This patient meets SIRS Criteria and may be septic. SIRS = Systemic Inflammatory Response Syndrome  Best Practice Recommends:   Notify the nurse immediately to increase monitoring of patient.   The recent clinical data is shown below. Vitals:   09/25/17 1436 09/25/17 1447 09/25/17 1500 09/25/17 1530  BP: (!) 203/92  (!) 201/90   Pulse:   (!) 104   Resp: (!) 32  (!) 25   Temp:  (!) 101.3 F (38.5 C)    TempSrc:  Oral    SpO2:   (!) 88% 90%    Pt d/w Dr. Maudie Mercury ([facility]) for admission.   Final Clinical Impressions(s) / ED Diagnoses   Final diagnoses:  Influenza A  Fever, unspecified fever cause  Acute respiratory failure with hypoxia (HCC)  Acute cystitis without hematuria  ED Discharge Orders    None       Isla Pence, MD 09/25/17 [phone]

## 2017-09-25 NOTE — ED Triage Notes (Addendum)
Pt reports was put on antibiotics for pneumonia and has been taking otc cold medication.  PT reports sudden onset of tremors in hands and arms x 1 hour.  Denies pain.  CBG 125 per ems.  Pt has very congested cough and generalized weakness.

## 2017-09-25 NOTE — H&P (Signed)
TRH H&P   Patient Demographics:    Manuel Le, is a 84 y.o. male  MRN: [id number]   DOB - [birth year]  Admit Date - 09/25/2017  Outpatient Primary MD for the patient is Celene Squibb, MD  Referring MD/NP/PA: Isla Pence  Outpatient Specialists:   Patient coming from: home  Chief Complaint  Patient presents with  . Tremors      HPI:    Manuel Le  is a 84 y.o. male, w Hypertension, Pafib, CAD s/p stent, nephrolithiasis, bladder cancer apparently has had cough w yellow sputum for the past 1 week and today c/o fever as well as shaking chills.  Pt presented to ED for evaluation   In ED,  Na 139, K 4.1, Bun 23, Creatinine 0.89 Ast 45, Alt 33, Alk phos 50, T. Bili 1.1  Wbc 11.0, Hgb 13.8, Plt 159 Trop 0.03  EKG ST at 102, nl axis, RBBB  Urinalysis WBC tntc, rbc 6-30 Influenza A +  Pt will be admitted for sepsis (leukocytosis, fever, tachycardia , trop elevation), secondary to UTI, ? Influenza A, bronchitis, troponin elevation.     Review of systems:    In addition to the HPI above,  + Fever/ chills.  No Headache, No changes with Vision or hearing, No problems swallowing food or Liquids, No Chest pain, Shortness of Breath, No Abdominal pain, No Nausea or Vommitting, Bowel movements are regular, No Blood in stool or Urine, No dysuria per pt No new skin rashes or bruises, No new joints pains-aches,  No new weakness, tingling, numbness in any extremity, No recent weight gain or loss, No polyuria, polydypsia or polyphagia, No significant Mental Stressors.  A full 10 point Review of Systems was done, except as stated above, all other Review of Systems were negative.   With Past History of the following :    Past Medical History:  Diagnosis Date  . Arthritis    OSTEO  . Atrial fibrillation and flutter ([place])   . Bladder cancer ([place])   . Coronary artery  disease   . Dysrhythmia   . Exertional angina ([place]) 07/08/2014  . H/O urinary frequency   . Hematuria   . Hypertension   . Iron deficiency anemia   . Kidney stones    hx of   . Mild obstructive sleep apnea    no cpap  . S/P coronary artery stent placement 07/24/2014  . Weakness       Past Surgical History:  Procedure Laterality Date  . CARDIAC CATHETERIZATION    . CARDIOVASCULAR STRESS TEST  12-15-2013  DR Kate Sable ([place])   MILD - MODERATE PERI-INFARCT ISCHEMIA  OF INFERIOR WALL, MID-INFEROSEPTAL WALL, MID-INFEROLATERAL WALL, & BASAL INFERIOR WALL/   NORMAL LVF /  EF 59%/  INTERMEDIATE RISK STUDY  . COLONOSCOPY  07/04/2012   Procedure: COLONOSCOPY;  Surgeon: Rogene Houston, MD;  [facility];  Service: Endoscopy;  Laterality: N/A;  320  . CORONARY STENT PLACEMENT  07/08/2014   PTCA of RCA   DR COOPER  . CYSTOSCOPY W/ RETROGRADES Bilateral 12/31/2013   Procedure: CYSTOSCOPY WITH RETROGRADE PYELOGRAM;  Surgeon: Alexis Frock, MD;  Location: [facility];  Service: Urology;  Laterality: Bilateral;  . CYSTOSCOPY W/ RETROGRADES Bilateral 01/20/2015   Procedure: CYSTOSCOPY WITH RETROGRADE PYELOGRAM;  Surgeon: Alexis Frock, MD;  Location: [facility];  Service: Urology;  Laterality: Bilateral;  . ESOPHAGOGASTRODUODENOSCOPY  07/04/2012   Procedure: ESOPHAGOGASTRODUODENOSCOPY (EGD);  Surgeon: Rogene Houston, MD;  [facility];  Service: Endoscopy;  Laterality: N/A;  . EYE SURGERY     past cataract surgery bilat  . HERNIA REPAIR Right   . LEFT HEART CATHETERIZATION WITH CORONARY ANGIOGRAM N/A 07/08/2014   Procedure: LEFT HEART CATHETERIZATION WITH CORONARY ANGIOGRAM;  Surgeon: Blane Ohara, MD;  Location: [facility] CATH LAB;  Service: Cardiovascular;  Laterality: N/A;  . PERCUTANEOUS CORONARY STENT INTERVENTION (PCI-S)  07/08/2014   Procedure: PERCUTANEOUS CORONARY STENT INTERVENTION (PCI-S);  Surgeon: Blane Ohara, MD;  Location: [facility] CATH LAB;  Service:  Cardiovascular;;  . TOTAL KNEE ARTHROPLASTY Bilateral 2000  &  2003?  . TRANSTHORACIC ECHOCARDIOGRAM  09-11-2013   MODERATE LVH/  EF 55-60%/  MILD MR & TR/  MILD TO MODERATE CALCIFIED AV WITHOUT STENOSIS/  MILD LAE/   MODERATE PR  . TRANSURETHRAL RESECTION OF BLADDER TUMOR N/A 01/20/2015   Procedure: TRANSURETHRAL RESECTION OF BLADDER TUMOR (TURBT);  Surgeon: Alexis Frock, MD;  [facility];  Service: Urology;  Laterality: N/A;  . TRANSURETHRAL RESECTION OF BLADDER TUMOR WITH GYRUS (TURBT-GYRUS) N/A 12/31/2013   Procedure: TRANSURETHRAL RESECTION OF BLADDER TUMOR WITH GYRUS (TURBT-GYRUS);  Surgeon: Alexis Frock, MD;  Location: [facility];  Service: Urology;  Laterality: N/A;  . ZENKER'S DIVERTICULECTOMY N/A 09/09/2015   Procedure: ENDOSCOPIC ZENKER'S DIVERTICULECTOMY;  Surgeon: Izora Gala, MD;  Location: [facility];  Service: ENT;  Laterality: N/A;  . ZENKER'S DIVERTICULECTOMY ENDOSCOPIC  09/09/2015      Social History:     Social History   Tobacco Use  . Smoking status: Former Smoker    Packs/day: 2.00    Years: 20.00    Pack years: 40.00    Types: Cigarettes, Cigars, Pipe    Last attempt to quit: 08/21/1968    Years since quitting: 49.1  . Smokeless tobacco: Never Used  . Tobacco comment: quit smoking in 1980  Substance Use Topics  . Alcohol use: No    Alcohol/week: 0.0 oz     Lives - at home  Mobility - walks by self   Family History :    History reviewed. No pertinent family history. Negative for bladder cancer/colon cancer    Home Medications:   Prior to Admission medications   Medication Sig Start Date End Date Taking? Authorizing Provider  amLODipine (NORVASC) 10 MG tablet 1 tablet (10 mg total) by Per NG tube route daily. 09/15/15   Izora Gala, MD  aspirin 325 MG tablet 1 tablet (325 mg total) by Per NG tube route daily. 09/15/15   Izora Gala, MD  atorvastatin (LIPITOR) 20 MG tablet 1 tablet (20 mg total) by Per NG tube route daily at 6 PM. 09/15/15   Izora Gala, MD  carvedilol (COREG) 3.125 MG tablet 1 tablet (3.125 mg total) by Per NG tube route 2 (two) times daily with a meal. 09/15/15   Izora Gala, MD  Cholecalciferol (VITAMIN D-3) 1000 units CAPS  1 capsule (1,000 Units total) by Per NG tube route daily. 09/15/15   Izora Gala, MD  ferrous sulfate 325 (65 FE) MG EC tablet Take 325 mg by mouth daily with breakfast.    [provider]  HYDROcodone-acetaminophen (HYCET) 7.5-325 mg/15 ml solution Take 15 mLs by mouth 4 (four) times daily as needed for moderate pain. 09/09/15   Izora Gala, MD  HYDROcodone-acetaminophen (HYCET) 7.5-325 mg/15 ml solution Place 10-15 mLs into feeding tube every 4 (four) hours as needed for moderate pain. 09/15/15   Izora Gala, MD  losartan (COZAAR) 100 MG tablet 1 tablet (100 mg total) by Per NG tube route daily. 09/15/15   Izora Gala, MD  Misc Natural Products (OSTEO BI-FLEX ADV JOINT SHIELD) TABS Take 1 tablet by mouth 2 (two) times daily.     [provider]  Multiple Vitamins-Minerals (CENTRUM SILVER ADULT 50+) TABS Take 1 tablet by mouth every morning.    [provider]  nitroGLYCERIN (NITROSTAT) 0.4 MG SL tablet Place 1 tablet (0.4 mg total) under the tongue every 5 (five) minutes as needed for chest pain. 07/03/14   Herminio Commons, MD  Nutritional Supplements (FEEDING SUPPLEMENT, JEVITY 1.2 CAL,) LIQD Place 1,000 mLs into feeding tube continuous. 09/15/15   Izora Gala, MD  promethazine (PHENERGAN) 25 MG suppository Place 1 suppository (25 mg total) rectally every 6 (six) hours as needed for nausea. 09/15/15   Izora Gala, MD  vitamin B-12 (CYANOCOBALAMIN) 1000 MCG tablet 1 tablet (1,000 mcg total) by Per NG tube route daily. 09/15/15   Izora Gala, MD     Allergies:    No Known Allergies   Physical Exam:   Vitals  Blood pressure (!) 136/55, pulse 85, temperature (!) 101.3 F (38.5 C), temperature source Oral, resp. rate 18, SpO2 95 %.   1. General ying in bed in NAD,     2. Normal affect and insight, Not Suicidal or Homicidal, Awake Alert, Oriented X 3.  3. No F.N deficits, ALL C.Nerves Intact, Strength 5/5 all 4 extremities, Sensation intact all 4 extremities, Plantars down going.  4. Ears and Eyes appear Normal, Conjunctivae clear, PERRLA. Moist Oral Mucosa.  5. Supple Neck, No JVD, No cervical lymphadenopathy appriciated, No Carotid Bruits.  6. Symmetrical Chest wall movement, Good air movement bilaterally, slight crackles right lung base, no wheezing.   7. RRR, No Gallops, Rubs or Murmurs, No Parasternal Heave.  8. Positive Bowel Sounds, Abdomen Soft, No tenderness, No organomegaly appriciated,No rebound -guarding or rigidity.  9.  No Cyanosis, Normal Skin Turgor, No Skin Rash or Bruise.  10. Good muscle tone,  joints appear normal , no effusions, Normal ROM.  11. No Palpable Lymph Nodes in Neck or Axillae    Data Review:    CBC Recent Labs  Lab 09/25/17 1452  WBC 11.0*  HGB 13.8  HCT 42.1  PLT 159  MCV 93.1  MCH 30.5  MCHC 32.8  RDW 13.4  LYMPHSABS 0.4*  MONOABS 0.8  EOSABS 0.0  BASOSABS 0.0   ------------------------------------------------------------------------------------------------------------------  Chemistries  Recent Labs  Lab 09/25/17 1452  NA 139  K 4.1  CL 101  CO2 22  GLUCOSE 133*  BUN 23*  CREATININE 0.89  CALCIUM 9.3  AST 45*  ALT 33  ALKPHOS 50  BILITOT 1.1   ------------------------------------------------------------------------------------------------------------------ CrCl cannot be calculated (Unknown ideal weight.). ------------------------------------------------------------------------------------------------------------------ No results for input(s): TSH, T4TOTAL, T3FREE, THYROIDAB in the last 72 hours.  Invalid input(s): FREET3  Coagulation profile No results for input(s):  INR, PROTIME in the last 168  hours. ------------------------------------------------------------------------------------------------------------------- No results for input(s): DDIMER in the last 72 hours. -------------------------------------------------------------------------------------------------------------------  Cardiac Enzymes Recent Labs  Lab 09/25/17 1452  TROPONINI 0.03*   ------------------------------------------------------------------------------------------------------------------ No results found for: BNP   ---------------------------------------------------------------------------------------------------------------  Urinalysis    Component Value Date/Time   COLORURINE YELLOW 09/25/2017 1454   APPEARANCEUR CLOUDY (A) 09/25/2017 1454   LABSPEC 1.020 09/25/2017 1454   PHURINE 5.0 09/25/2017 1454   GLUCOSEU NEGATIVE 09/25/2017 1454   HGBUR SMALL (A) 09/25/2017 1454   BILIRUBINUR NEGATIVE 09/25/2017 1454   KETONESUR 5 (A) 09/25/2017 1454   PROTEINUR 100 (A) 09/25/2017 1454   UROBILINOGEN 1.0 11/27/2013 2214   NITRITE NEGATIVE 09/25/2017 1454   LEUKOCYTESUR LARGE (A) 09/25/2017 1454    ----------------------------------------------------------------------------------------------------------------   Imaging Results:    Dg Chest Port 1 View  Result Date: 09/25/2017 CLINICAL DATA:  Shortness of breath EXAM: PORTABLE CHEST 1 VIEW COMPARISON:  Chest x-ray of Dec 31, 2013 FINDINGS: The lungs are borderline hypoinflated. The interstitial markings are coarse though stable. The cardiac silhouette is mildly enlarged. The pulmonary vascularity is not engorged. There calcification in the wall of the aortic arch. The mediastinum is normal in width. There is no pleural effusion. IMPRESSION: Mild chronic bronchitic changes accentuated by borderline hypoinflation. No acute pneumonia nor CHF. Thoracic aortic atherosclerosis. Electronically Signed   By: David  Martinique M.D.   On: 09/25/2017 15:34        Assessment & Plan:    Principal Problem:   Sepsis ([place]) Active Problems:   Fever   UTI (urinary tract infection)   Abnormal liver function   Tachycardia    Sepsis (fever, tachycardia, leukocytosis, trop elevation) Blood culture x2 Awaiting urine culture Rocephin 1 gm iv qday Check cbc in am  Fever Secondary to influenza A vs UTI Monitor  UTI Awaiting urine culture Start Rocephin 1gm iv qday  Influenza A tamiflu '75mg'$  po bid x 5 days  Bronchitis zithromax '500mg'$  iv qday  Tachycardia Tele Trop I q6h x3 D dimer , if positive then CTA chest r/o PE  Trop elevation Check cardiac echo Cardiology consult for evaluation   CAD s/p stent  Cont losartan Cont carvedilol Cont lipitor Cont amlodipine Cont aspirin  Abnormal liver function Check acute hepatitis panel Check RUQ ultrasound Check cmp in am  DVT Prophylaxis Lovenox - SCDs  AM Labs Ordered, also please review Full Orders  Family Communication: Admission, patients condition and plan of care including tests being ordered have been discussed with the patient  who indicate understanding and agree with the plan and Code Status.  Code Status FULL CODE  Likely DC to  home  Condition GUARDED    Consults called: cardiology in computer for evaluation of trop elevation  Admission status: inpatient  Time spent in minutes : 45   Jani Gravel M.D on 09/25/2017 at 6:46 PM  Between 7am to 7pm - Pager - [phone] . After 7pm go to [email or web address] - password [facility]  [facility] - Office  [phone]

## 2017-09-25 NOTE — Telephone Encounter (Signed)
09/25/17  pt cx because he said that he was still sick

## 2017-09-25 NOTE — ED Notes (Signed)
CRITICAL VALUE ALERT  Critical Value:  Troponin 0.03   Date & Time Notied:  09/25/17 1625  Provider Notified: Notified MD   Orders Received/Actions taken: Notified MD

## 2017-09-26 ENCOUNTER — Inpatient Hospital Stay (HOSPITAL_COMMUNITY): Payer: Medicare Other

## 2017-09-26 ENCOUNTER — Other Ambulatory Visit: Payer: Self-pay

## 2017-09-26 ENCOUNTER — Other Ambulatory Visit (HOSPITAL_COMMUNITY): Payer: Medicare Other

## 2017-09-26 DIAGNOSIS — J690 Pneumonitis due to inhalation of food and vomit: Secondary | ICD-10-CM

## 2017-09-26 DIAGNOSIS — N3000 Acute cystitis without hematuria: Secondary | ICD-10-CM

## 2017-09-26 DIAGNOSIS — J101 Influenza due to other identified influenza virus with other respiratory manifestations: Secondary | ICD-10-CM

## 2017-09-26 DIAGNOSIS — I25118 Atherosclerotic heart disease of native coronary artery with other forms of angina pectoris: Secondary | ICD-10-CM

## 2017-09-26 DIAGNOSIS — I480 Paroxysmal atrial fibrillation: Secondary | ICD-10-CM

## 2017-09-26 DIAGNOSIS — A419 Sepsis, unspecified organism: Secondary | ICD-10-CM

## 2017-09-26 DIAGNOSIS — J9601 Acute respiratory failure with hypoxia: Secondary | ICD-10-CM

## 2017-09-26 LAB — COMPREHENSIVE METABOLIC PANEL
ALBUMIN: 2.9 g/dL — AB (ref 3.5–5.0)
ALK PHOS: 37 U/L — AB (ref 38–126)
ALT: 29 U/L (ref 17–63)
AST: 35 U/L (ref 15–41)
Anion gap: 10 (ref 5–15)
BUN: 18 mg/dL (ref 6–20)
CALCIUM: 8 mg/dL — AB (ref 8.9–10.3)
CO2: 21 mmol/L — AB (ref 22–32)
CREATININE: 0.64 mg/dL (ref 0.61–1.24)
Chloride: 109 mmol/L (ref 101–111)
GFR calc non Af Amer: >60 mL/min (ref >60)
GLUCOSE: 94 mg/dL (ref 65–99)
Potassium: 3.5 mmol/L (ref 3.5–5.1)
SODIUM: 140 mmol/L (ref 135–145)
Total Bilirubin: 0.9 mg/dL (ref 0.3–1.2)
Total Protein: 6.3 g/dL — ABNORMAL LOW (ref 6.5–8.1)

## 2017-09-26 LAB — TROPONIN I
Troponin I: 0.03 ng/mL (ref <0.03)
Troponin I: <0.03 ng/mL (ref <0.03)

## 2017-09-26 LAB — ECHOCARDIOGRAM COMPLETE
HEIGHTINCHES: 72 in
WEIGHTICAEL: 3280.44 [oz_av]

## 2017-09-26 LAB — CBC
HCT: 35.7 % — ABNORMAL LOW (ref 39.0–52.0)
HEMOGLOBIN: 11.7 g/dL — AB (ref 13.0–17.0)
MCH: 30.2 pg (ref 26.0–34.0)
MCHC: 32.8 g/dL (ref 30.0–36.0)
MCV: 92.2 fL (ref 78.0–100.0)
Platelets: 134 10*3/uL — ABNORMAL LOW (ref 150–400)
RBC: 3.87 MIL/uL — AB (ref 4.22–5.81)
RDW: 13.4 % (ref 11.5–15.5)
WBC: 8.9 10*3/uL (ref 4.0–10.5)

## 2017-09-26 LAB — MRSA PCR SCREENING: MRSA by PCR: NEGATIVE

## 2017-09-26 LAB — PROCALCITONIN: Procalcitonin: 0.95 ng/mL

## 2017-09-26 MED ORDER — AMPICILLIN-SULBACTAM SODIUM 3 (2-1) G IJ SOLR
3.0000 g | Freq: Three times a day (TID) | INTRAMUSCULAR | Status: DC
  Administered 2017-09-26 – 2017-09-28 (×7): 3 g via INTRAVENOUS
  Filled 2017-09-26 (×16): qty 3

## 2017-09-26 MED ORDER — SODIUM CHLORIDE 0.9 % IV SOLN
INTRAVENOUS | Status: AC
  Administered 2017-09-26: 02:00:00 via INTRAVENOUS

## 2017-09-26 MED ORDER — VANCOMYCIN HCL 10 G IV SOLR
1250.0000 mg | INTRAVENOUS | Status: DC
  Filled 2017-09-26: qty 1250

## 2017-09-26 MED ORDER — JEVITY 1.5 CAL/FIBER PO LIQD
1000.0000 mL | ORAL | Status: DC
  Filled 2017-09-26 (×2): qty 1000
  Filled 2017-09-26: qty 237
  Filled 2017-09-26 (×8): qty 1000

## 2017-09-26 MED ORDER — OSELTAMIVIR PHOSPHATE 75 MG PO CAPS
75.0000 mg | ORAL_CAPSULE | Freq: Two times a day (BID) | ORAL | Status: DC
  Administered 2017-09-26 – 2017-09-28 (×6): 75 mg via ORAL
  Filled 2017-09-26 (×7): qty 1

## 2017-09-26 MED ORDER — PROMETHAZINE HCL 25 MG RE SUPP: 25.0000 mg | Freq: Four times a day (QID) | RECTAL | Status: DC | PRN

## 2017-09-26 MED ORDER — LOSARTAN POTASSIUM 50 MG PO TABS
100.0000 mg | ORAL_TABLET | Freq: Every day | ORAL | Status: DC
Start: 2017-09-26 — End: 2017-09-28
  Administered 2017-09-26 – 2017-09-28 (×3): 100 mg via NASOGASTRIC
  Filled 2017-09-26 (×3): qty 2

## 2017-09-26 MED ORDER — CEFTRIAXONE SODIUM 1 G IJ SOLR
1.0000 g | INTRAMUSCULAR | Status: DC
  Filled 2017-09-26: qty 10

## 2017-09-26 MED ORDER — ASPIRIN 325 MG PO TABS
325.0000 mg | ORAL_TABLET | Freq: Every day | ORAL | Status: DC
  Administered 2017-09-26 – 2017-09-28 (×3): 325 mg via NASOGASTRIC
  Filled 2017-09-26 (×3): qty 1

## 2017-09-26 MED ORDER — VANCOMYCIN HCL 10 G IV SOLR
1500.0000 mg | Freq: Once | INTRAVENOUS | Status: AC
  Administered 2017-09-26: 1500 mg via INTRAVENOUS
  Filled 2017-09-26: qty 1500

## 2017-09-26 MED ORDER — ATORVASTATIN CALCIUM 20 MG PO TABS
20.0000 mg | ORAL_TABLET | Freq: Every day | ORAL | Status: DC
  Administered 2017-09-26 – 2017-09-27 (×2): 20 mg via NASOGASTRIC
  Filled 2017-09-26 (×2): qty 1

## 2017-09-26 MED ORDER — VITAMIN B-12 1000 MCG PO TABS
1000.0000 ug | ORAL_TABLET | Freq: Every day | ORAL | Status: DC
  Administered 2017-09-26 – 2017-09-28 (×3): 1000 ug via NASOGASTRIC
  Filled 2017-09-26 (×3): qty 1

## 2017-09-26 MED ORDER — CARVEDILOL 3.125 MG PO TABS
3.1250 mg | ORAL_TABLET | Freq: Two times a day (BID) | ORAL | Status: DC
  Administered 2017-09-26 – 2017-09-28 (×5): 3.125 mg via NASOGASTRIC
  Filled 2017-09-26 (×6): qty 1

## 2017-09-26 MED ORDER — ACETAMINOPHEN 325 MG PO TABS: 650.0000 mg | ORAL_TABLET | Freq: Four times a day (QID) | ORAL | Status: DC | PRN

## 2017-09-26 MED ORDER — VITAMIN D 1000 UNITS PO TABS
3000.0000 [IU] | ORAL_TABLET | Freq: Every day | ORAL | Status: DC
  Administered 2017-09-26 – 2017-09-28 (×3): 3000 [IU] via ORAL
  Filled 2017-09-26 (×3): qty 3

## 2017-09-26 MED ORDER — AMLODIPINE BESYLATE 5 MG PO TABS
10.0000 mg | ORAL_TABLET | Freq: Every day | ORAL | Status: DC
  Administered 2017-09-26 – 2017-09-28 (×3): 10 mg via NASOGASTRIC
  Filled 2017-09-26 (×3): qty 2

## 2017-09-26 MED ORDER — ENOXAPARIN SODIUM 40 MG/0.4ML ~~LOC~~ SOLN
40.0000 mg | SUBCUTANEOUS | Status: DC
  Administered 2017-09-26 – 2017-09-28 (×3): 40 mg via SUBCUTANEOUS
  Filled 2017-09-26 (×3): qty 0.4

## 2017-09-26 MED ORDER — CHOLECALCIFEROL 400 UNIT/ML PO LIQD
3000.0000 [IU] | Freq: Every day | ORAL | Status: DC
  Filled 2017-09-26: qty 7.5

## 2017-09-26 MED ORDER — ACETAMINOPHEN 650 MG RE SUPP: 650.0000 mg | Freq: Four times a day (QID) | RECTAL | Status: DC | PRN

## 2017-09-26 MED ORDER — HYDROCODONE-ACETAMINOPHEN 7.5-325 MG/15ML PO SOLN
15.0000 mL | Freq: Four times a day (QID) | ORAL | Status: DC | PRN
  Administered 2017-09-26 – 2017-09-28 (×3): 15 mL via ORAL
  Filled 2017-09-26 (×3): qty 15

## 2017-09-26 MED ORDER — FERROUS SULFATE 325 (65 FE) MG PO TABS
325.0000 mg | ORAL_TABLET | Freq: Every day | ORAL | Status: DC
  Administered 2017-09-26 – 2017-09-28 (×3): 325 mg via ORAL
  Filled 2017-09-26 (×3): qty 1

## 2017-09-26 NOTE — Progress Notes (Signed)
PROGRESS NOTE    Manuel Le  WUG:[id number] DOB: Jul 30, 1927 DOA: 09/25/2017 PCP: Celene Squibb, MD     Brief Narrative:  84 year old man admitted from home on 2/5 due to tremors.  He has been complaining of a cough with yellow sputum production for the past week and on the day of admission had fever and shaking chills.  He was found to be influenza A positive and admission was requested.   Assessment & Plan:   Principal Problem:   Sepsis ([place]) Active Problems:   Fever   UTI (urinary tract infection)   Abnormal liver function   Tachycardia   Mild sepsis -Due to influenza A and aspiration pneumonia. -Sepsis parameters have resolved.  Acute hypoxemic respiratory failure -Patient does not use oxygen at home, but is currently requiring 3-4 L of oxygen. -He was found to be influenza A positive, also on CT scan of the chest was found to have diffuse peri-bronchovascular density at the right lung base with partial consolidation raising concern for aspiration pneumonia. -While at bedside today, he was noticed to have significant coughing with brownish sputum production and what appeared to be food particles. -We will request speech therapy evaluation for swallowing. -Will DC Rocephin and azithromycin for community-acquired pneumonia coverage and instead placed on Unasyn as dosed by pharmacy. -Continue Tamiflu for influenza A. -Check pro-calcitonin.  Questionable UTI -UA is certainly dirty with pyuria. -Urine culture is pending. -Unasyn should cover.  Elevated troponin -Mild, no chest pain. -Likely due to acute hypoxemic respiratory failure. -EKG without acute ischemic changes. -Check 2D echo, as long as EF is intact and no wall motion abnormalities, do not anticipate further workup.   DVT prophylaxis: SCDs Code Status: DNR Family Communication: Patient only Disposition Plan: See no need for stepdown level of care at this time, okay for transfer to floor.  Patient however is  still not ready for discharge and remains acutely ill with an acute hypoxemic respiratory failure.  Consultants:   None  Procedures:   2D echo ordered, results pending  Antimicrobials:  Anti-infectives (From admission, onward)   Start     Dose/Rate Route Frequency Ordered Stop   09/26/17 1600  cefTRIAXone (ROCEPHIN) 1 g in dextrose 5 % 50 mL IVPB  Status:  Discontinued     1 g 100 mL/hr over 30 Minutes Intravenous Every 24 hours 09/26/17 0042 09/26/17 0912   09/26/17 1600  azithromycin (ZITHROMAX) 500 mg in dextrose 5 % 250 mL IVPB  Status:  Discontinued     500 mg 250 mL/hr over 60 Minutes Intravenous Every 24 hours 09/25/17 1757 09/26/17 0912   09/26/17 0042  oseltamivir (TAMIFLU) capsule 75 mg     75 mg Oral 2 times daily 09/26/17 0042 09/30/17 2159   09/25/17 1700  oseltamivir (TAMIFLU) capsule 75 mg     75 mg Oral  Once 09/25/17 1652 09/25/17 2034   09/25/17 1445  cefTRIAXone (ROCEPHIN) 1 g in dextrose 5 % 50 mL IVPB     1 g 100 mL/hr over 30 Minutes Intravenous  Once 09/25/17 1435 09/25/17 1613   09/25/17 1445  azithromycin (ZITHROMAX) 500 mg in dextrose 5 % 250 mL IVPB     500 mg 250 mL/hr over 60 Minutes Intravenous  Once 09/25/17 1435 09/25/17 1716       Subjective: Lying in bed, states he feels "like a truck ran over me".  Significant cough with brownish sputum with what appear to be food particles in his basin at bedside.  Objective: Vitals:   09/26/17 0600 09/26/17 0754 09/26/17 0812 09/26/17 1019  BP: 138/84  (!) 155/76 (!) 152/76  Pulse: 68 70 85   Resp: 20 19    Temp:  98.5 F (36.9 C)    TempSrc:  Oral    SpO2: 94% 95%    Weight:      Height:        Intake/Output Summary (Last 24 hours) at 09/26/2017 1043 Last data filed at 09/26/2017 0944 Gross per 24 hour  Intake 3518.75 ml  Output 450 ml  Net 3068.75 ml   Filed Weights   09/26/17 0012 09/26/17 0500  Weight: 93 kg (205 lb 0.4 oz) 93 kg (205 lb 0.4 oz)    Examination:  General exam: Alert,  awake, oriented x 3, pleasant and cooperative to exam. Respiratory system: Significant bilateral rhonchi and coarse breath sounds, no wheezing, decreased breath sounds to right base Cardiovascular system:RRR. No murmurs, rubs, gallops. Gastrointestinal system: Abdomen is nondistended, soft and nontender. No organomegaly or masses felt. Normal bowel sounds heard. Central nervous system: Alert and oriented. No focal neurological deficits. Extremities: No C/C/E, +pedal pulses Skin: No rashes, lesions or ulcers Psychiatry: Judgement and insight appear normal. Mood & affect appropriate.     Data Reviewed: I have personally reviewed following labs and imaging studies  CBC: Recent Labs  Lab 09/25/17 1452 09/26/17 0714  WBC 11.0* 8.9  NEUTROABS 9.7*  --   HGB 13.8 11.7*  HCT 42.1 35.7*  MCV 93.1 92.2  PLT 159 161*   Basic Metabolic Panel: Recent Labs  Lab 09/25/17 1452 09/26/17 0714  NA 139 140  K 4.1 3.5  CL 101 109  CO2 22 21*  GLUCOSE 133* 94  BUN 23* 18  CREATININE 0.89 0.64  CALCIUM 9.3 8.0*   GFR: Estimated Creatinine Clearance: 70.1 mL/min (by C-G formula based on SCr of 0.64 mg/dL). Liver Function Tests: Recent Labs  Lab 09/25/17 1452 09/26/17 0714  AST 45* 35  ALT 33 29  ALKPHOS 50 37*  BILITOT 1.1 0.9  PROT 7.8 6.3*  ALBUMIN 3.7 2.9*   No results for input(s): LIPASE, AMYLASE in the last 168 hours. No results for input(s): AMMONIA in the last 168 hours. Coagulation Profile: No results for input(s): INR, PROTIME in the last 168 hours. Cardiac Enzymes: Recent Labs  Lab 09/25/17 1452 09/26/17 0110 09/26/17 0714  TROPONINI 0.03* 0.03* <0.03   BNP (last 3 results) No results for input(s): PROBNP in the last 8760 hours. HbA1C: No results for input(s): HGBA1C in the last 72 hours. CBG: No results for input(s): GLUCAP in the last 168 hours. Lipid Profile: No results for input(s): CHOL, HDL, LDLCALC, TRIG, CHOLHDL, LDLDIRECT in the last 72  hours. Thyroid Function Tests: No results for input(s): TSH, T4TOTAL, FREET4, T3FREE, THYROIDAB in the last 72 hours. Anemia Panel: No results for input(s): VITAMINB12, FOLATE, FERRITIN, TIBC, IRON, RETICCTPCT in the last 72 hours. Urine analysis:    Component Value Date/Time   COLORURINE YELLOW 09/25/2017 1454   APPEARANCEUR CLOUDY (A) 09/25/2017 1454   LABSPEC 1.020 09/25/2017 1454   PHURINE 5.0 09/25/2017 1454   GLUCOSEU NEGATIVE 09/25/2017 1454   HGBUR SMALL (A) 09/25/2017 1454   BILIRUBINUR NEGATIVE 09/25/2017 1454   KETONESUR 5 (A) 09/25/2017 1454   PROTEINUR 100 (A) 09/25/2017 1454   UROBILINOGEN 1.0 11/27/2013 2214   NITRITE NEGATIVE 09/25/2017 1454   LEUKOCYTESUR LARGE (A) 09/25/2017 1454   Sepsis Labs: @LABRCNTIP (procalcitonin:4,lacticidven:4)  ) Recent Results (from the past 240  hour(s))  Blood Culture (routine x 2)     Status: None (Preliminary result)   Collection Time: 09/25/17  2:52 PM  Result Value Ref Range Status   Specimen Description BLOOD RIGHT ANTECUBITAL  Final   Special Requests Blood Culture adequate volume  Final   Culture   Final    NO GROWTH < 24 HOURS Performed at [facility], [address] [place], [place] [address]    Report Status PENDING  Incomplete  Blood Culture (routine x 2)     Status: None (Preliminary result)   Collection Time: 09/25/17  2:52 PM  Result Value Ref Range Status   Specimen Description BLOOD LEFT HAND  Final   Special Requests   Final    BOTTLES DRAWN AEROBIC AND ANAEROBIC Blood Culture adequate volume   Culture   Final    NO GROWTH < 24 HOURS Performed at [facility], [address], [place], [place] [address]    Report Status PENDING  Incomplete  MRSA PCR Screening     Status: None   Collection Time: 09/26/17 12:47 AM  Result Value Ref Range Status   MRSA by PCR NEGATIVE NEGATIVE Final    Comment:        The GeneXpert MRSA Assay (FDA approved for NASAL specimens only), is one component of a comprehensive  MRSA colonization surveillance program. It is not intended to diagnose MRSA infection nor to guide or monitor treatment for MRSA infections. Performed at [facility], [address] [place], [place] [address]          Radiology Studies: Ct Angio Chest Pe W Or Wo Contrast  Result Date: 09/26/2017 CLINICAL DATA:  Acute onset of productive cough and fever. Shaking chills. Elevated D-dimer. Atrial fibrillation and high blood pressure. EXAM: CT ANGIOGRAPHY CHEST WITH CONTRAST TECHNIQUE: Multidetector CT imaging of the chest was performed using the standard protocol during bolus administration of intravenous contrast. Multiplanar CT image reconstructions and MIPs were obtained to evaluate the vascular anatomy. CONTRAST:  145mL ISOVUE-370 IOPAMIDOL (ISOVUE-370) INJECTION 76% COMPARISON:  Chest radiograph performed earlier today at 3:25 p.m. FINDINGS: Cardiovascular:  There is no evidence of pulmonary embolus. The heart is normal in size. Diffuse coronary artery calcifications are seen. Scattered calcification is seen along the aortic arch and descending thoracic aorta. Scattered calcification is seen along the great vessels. Mediastinum/Nodes: A mildly enlarged 1.5 cm subcarinal node is noted. No additional mediastinal lymphadenopathy is seen. No pericardial effusion is identified. The thyroid gland is grossly unremarkable in appearance. No axillary lymphadenopathy is seen. Lungs/Pleura: There is diffuse peribronchovascular density at the right lung base, with partial consolidation of the right lung base, and obscuration of the bronchioles, raising concern for aspiration pneumonia. Scattered peripheral airspace opacities bilaterally may reflect chronic changes. No dominant mass is identified. No pleural effusion or pneumothorax is seen. Upper Abdomen: The visualized portions of the liver and spleen are unremarkable. The visualized portions of the gallbladder are within normal limits. Musculoskeletal: No  acute osseous abnormalities are identified. The visualized musculature is unremarkable in appearance. Review of the MIP images confirms the above findings. IMPRESSION: 1. No evidence of pulmonary embolus. 2. Diffuse peribronchovascular density at the right lung base, with partial consolidation of the right lung base, and obscuration of the bronchioles, raising concern for aspiration pneumonia. 3. Mildly enlarged 1.5 cm subcarinal node. 4. Diffuse coronary artery calcifications. Electronically Signed   By: Garald Balding M.D.   On: 09/26/2017 00:28   Dg Chest [facility] [address]  Result Date: 09/25/2017 CLINICAL DATA:  Shortness of breath EXAM: PORTABLE CHEST 1 VIEW COMPARISON:  Chest x-ray of Dec 31, 2013 FINDINGS: The lungs are borderline hypoinflated. The interstitial markings are coarse though stable. The cardiac silhouette is mildly enlarged. The pulmonary vascularity is not engorged. There calcification in the wall of the aortic arch. The mediastinum is normal in width. There is no pleural effusion. IMPRESSION: Mild chronic bronchitic changes accentuated by borderline hypoinflation. No acute pneumonia nor CHF. Thoracic aortic atherosclerosis. Electronically Signed   By: David  Martinique M.D.   On: 09/25/2017 15:34        Scheduled Meds: . amLODipine  10 mg Per NG tube Daily  . aspirin  325 mg Per NG tube Daily  . atorvastatin  20 mg Per NG tube q1800  . carvedilol  3.125 mg Per NG tube BID WC  . cholecalciferol  3,000 Units Oral Daily  . enoxaparin (LOVENOX) injection  40 mg Subcutaneous Q24H  . ferrous sulfate  325 mg Oral Q breakfast  . losartan  100 mg Per NG tube Daily  . oseltamivir  75 mg Oral BID  . vitamin B-12  1,000 mcg Per NG tube Daily   Continuous Infusions: . sodium chloride Stopped (09/26/17 0200)  . sodium chloride 75 mL/hr at 09/26/17 0205  . feeding supplement (JEVITY 1.5 CAL/FIBER)       LOS: 1 day    Time spent: 35 minutes.     Lelon Frohlich, MD [facility] Pager [phone]  If 7PM-7AM, please contact night-coverage [email or web address] Password [facility] 09/26/2017, 10:43 AM

## 2017-09-26 NOTE — Progress Notes (Signed)
*  PRELIMINARY RESULTS* Echocardiogram 2D Echocardiogram has been performed.  Leavy Cella 09/26/2017, 2:23 PM

## 2017-09-26 NOTE — Evaluation (Signed)
Clinical/Bedside Swallow Evaluation Patient Details  Name: Manuel Le MRN: [id number] Date of Birth: 07/11/1927  Today's Date: 09/26/2017 Time: SLP Start Time (ACUTE ONLY): 1444 SLP Stop Time (ACUTE ONLY): 1514 SLP Time Calculation (min) (ACUTE ONLY): 30 min  Past Medical History:  Past Medical History:  Diagnosis Date  . Arthritis    OSTEO  . Atrial fibrillation and flutter ([place])   . Bladder cancer ([place])   . Coronary artery disease   . Dysrhythmia   . Exertional angina ([place]) 07/08/2014  . H/O urinary frequency   . Hematuria   . Hypertension   . Iron deficiency anemia   . Kidney stones    hx of   . Mild obstructive sleep apnea    no cpap  . S/P coronary artery stent placement 07/24/2014  . Weakness    Past Surgical History:  Past Surgical History:  Procedure Laterality Date  . CARDIAC CATHETERIZATION    . CARDIOVASCULAR STRESS TEST  12-15-2013  DR Kate Sable ([place])   MILD - MODERATE PERI-INFARCT ISCHEMIA  OF INFERIOR WALL, MID-INFEROSEPTAL WALL, MID-INFEROLATERAL WALL, & BASAL INFERIOR WALL/   NORMAL LVF /  EF 59%/  INTERMEDIATE RISK STUDY  . COLONOSCOPY  07/04/2012   Procedure: COLONOSCOPY;  Surgeon: Rogene Houston, MD;  [facility];  Service: Endoscopy;  Laterality: N/A;  320  . CORONARY STENT PLACEMENT  07/08/2014   PTCA of RCA   DR COOPER  . CYSTOSCOPY W/ RETROGRADES Bilateral 12/31/2013   Procedure: CYSTOSCOPY WITH RETROGRADE PYELOGRAM;  Surgeon: Alexis Frock, MD;  Location: [facility];  Service: Urology;  Laterality: Bilateral;  . CYSTOSCOPY W/ RETROGRADES Bilateral 01/20/2015   Procedure: CYSTOSCOPY WITH RETROGRADE PYELOGRAM;  Surgeon: Alexis Frock, MD;  Location: [facility];  Service: Urology;  Laterality: Bilateral;  . ESOPHAGOGASTRODUODENOSCOPY  07/04/2012   Procedure: ESOPHAGOGASTRODUODENOSCOPY (EGD);  Surgeon: Rogene Houston, MD;  [facility];  Service: Endoscopy;  Laterality: N/A;  . EYE SURGERY     past cataract  surgery bilat  . HERNIA REPAIR Right   . LEFT HEART CATHETERIZATION WITH CORONARY ANGIOGRAM N/A 07/08/2014   Procedure: LEFT HEART CATHETERIZATION WITH CORONARY ANGIOGRAM;  Surgeon: Blane Ohara, MD;  Location: [facility] CATH LAB;  Service: Cardiovascular;  Laterality: N/A;  . PERCUTANEOUS CORONARY STENT INTERVENTION (PCI-S)  07/08/2014   Procedure: PERCUTANEOUS CORONARY STENT INTERVENTION (PCI-S);  Surgeon: Blane Ohara, MD;  Location: [facility] CATH LAB;  Service: Cardiovascular;;  . TOTAL KNEE ARTHROPLASTY Bilateral 2000  &  2003?  . TRANSTHORACIC ECHOCARDIOGRAM  09-11-2013   MODERATE LVH/  EF 55-60%/  MILD MR & TR/  MILD TO MODERATE CALCIFIED AV WITHOUT STENOSIS/  MILD LAE/   MODERATE PR  . TRANSURETHRAL RESECTION OF BLADDER TUMOR N/A 01/20/2015   Procedure: TRANSURETHRAL RESECTION OF BLADDER TUMOR (TURBT);  Surgeon: Alexis Frock, MD;  Location: [facility];  Service: Urology;  Laterality: N/A;  . TRANSURETHRAL RESECTION OF BLADDER TUMOR WITH GYRUS (TURBT-GYRUS) N/A 12/31/2013   Procedure: TRANSURETHRAL RESECTION OF BLADDER TUMOR WITH GYRUS (TURBT-GYRUS);  Surgeon: Alexis Frock, MD;  Location: [facility];  Service: Urology;  Laterality: N/A;  . ZENKER'S DIVERTICULECTOMY N/A 09/09/2015   Procedure: ENDOSCOPIC ZENKER'S DIVERTICULECTOMY;  Surgeon: Izora Gala, MD;  Location: [place];  Service: ENT;  Laterality: N/A;  . ZENKER'S DIVERTICULECTOMY ENDOSCOPIC  09/09/2015   HPI:  84 year old man admitted from home on 2/5 due to tremors.  He has been complaining of a cough with yellow sputum production for the past week  and on the day of admission had fever and shaking chills.  He was found to be influenza A positive and admission was requested. Pt also found to have PNA. Chart review reveals that Pt had repair for a large Zenker's diverticulum in January 2017. He had MBSS following the repair and was found to have severe dysphagia due to poor propulsion of bolus through esophagus resulting in retrograde bolus movement  into the pharynx and larynx. He was discharged to a SNF and slowly began to resume oral diet. He reports that he still has solid food dysphagia.   Assessment / Plan / Recommendation Clinical Impression  SLP spent extensive time reviewing dysphagia history and current recommendations with Pt. Pt had repair of large Zenker's diverticulum in 08/2017, but continued to present with dysphagia following the procedure. He had MBSS 08/2017 with NPO recommendation and it appears Pt was discharged to [facility] with a feeding tube. He eventually resumed an oral diet, but continues to have solid food dysphagia.  He reports that "food comes back up" after he eats, especially if he leans over. Pt currently admitted with the flu and found to have PNA. Pt observed with water and Pt with delayed coughing, which he reports is typical. SLP explained that Pt was likely able to tolerate some aspiration in the past, but that with this decompensation with the flu, he was not able to compensate.   Pt kindly shared that he is NOT interested in any further work up (testing, diet modifications, surgeries, x-rays, etc.) in regards to his dysphagia. He shared that he understood the ramifications of likely aspiration and that he was "okay with that". He shared some concerns about his wife at home with memory difficulties and their 50 yo dog Engineer, manufacturing). He worries that he and his wife are going to die before their dog. One of his sisters is currently at home with his wife while he is hospitalized.   SLP suggested that Pt may do better on a liquid only diet for even a few days (as he tolerates liquids better than solids in terms of his pharyngoesophageal dysphagia), but he frankly stated that he does not want his diet modified, but he would be agreeable to consuming Boost/Ensure supplements.  Manuel Le may benefit from palliative care consult to facilitate further conversation regarding his concerns about his wife and dog at home and  his wishes near end of life. Recommend regular textures and thin liquids per Pt desire and consider RD consult to help with providing appropriate nutritional supplements. No further SLP services indicated at this time. Should Pt decide to alter his diet, would recommend MBSS to assess whether Pt may tolerate certain consistencies or textures better than another.  SLP Visit Diagnosis: Dysphagia, pharyngoesophageal phase (R13.14)    Aspiration Risk  Moderate aspiration risk;Risk for inadequate nutrition/hydration    Diet Recommendation Regular;Thin liquid(Pt explicitly states his wishes for regular textures)   Liquid Administration via: Cup;Straw Medication Administration: Whole meds with liquid Supervision: Patient able to self feed;Intermittent supervision to cue for compensatory strategies Compensations: Small sips/bites(frequent, smaller meals) Postural Changes: Seated upright at 90 degrees;Remain upright for at least 30 minutes after po intake    Other  Recommendations Recommended Consults: Other (Comment)(Consider Palliative care consult) Oral Care Recommendations: Oral care BID;Patient independent with oral care Other Recommendations: Clarify dietary restrictions   Follow up Recommendations None      Frequency and Duration            Prognosis Prognosis  for Safe Diet Advancement: Guarded Barriers to Reach Goals: Severity of deficits Barriers/Prognosis Comment: Suspect esophageal dysphagia negatively impacting pharyngeal swallow      Swallow Study   General Date of Onset: 09/25/17 HPI: 84 year old man admitted from home on 2/5 due to tremors.  He has been complaining of a cough with yellow sputum production for the past week and on the day of admission had fever and shaking chills.  He was found to be influenza A positive and admission was requested. Pt also found to have PNA. Chart review reveals that Pt had repair for a large Zenker's diverticulum in January 2017. He had MBSS  following the repair and was found to have severe dysphagia due to poor propulsion of bolus through esophagus resulting in retrograde bolus movement into the pharynx and larynx. He was discharged to a SNF and slowly began to resume oral diet. He reports that he still has solid food dysphagia. Type of Study: Bedside Swallow Evaluation Previous Swallow Assessment: MBSS 08/2015 rec NPO Diet Prior to this Study: Regular;Thin liquids Temperature Spikes Noted: No Respiratory Status: Nasal cannula History of Recent Intubation: No Behavior/Cognition: Alert;Cooperative;Pleasant mood Oral Cavity Assessment: Within Functional Limits Oral Care Completed by SLP: No Oral Cavity - Dentition: Adequate natural dentition Vision: Functional for self-feeding Self-Feeding Abilities: Able to feed self Patient Positioning: Upright in bed Baseline Vocal Quality: Normal;Wet Volitional Cough: Strong;Congested Volitional Swallow: Able to elicit    Oral/Motor/Sensory Function Overall Oral Motor/Sensory Function: Within functional limits   Ice Chips Ice chips: Not tested   Thin Liquid Thin Liquid: Impaired Presentation: Self Fed;Straw;Cup Pharyngeal  Phase Impairments: Cough - Delayed    Nectar Thick Nectar Thick Liquid: Not tested   Honey Thick Honey Thick Liquid: Not tested   Puree Puree: Not tested(Pt declined)   Solid   Thank you,  Genene Churn, CCC-SLP [phone]    Solid: Not tested        PORTER,DABNEY 09/26/2017,3:55 PM

## 2017-09-26 NOTE — Progress Notes (Signed)
Just received notification from RN that blood cultures are growing gram-positive cocci.  Start vancomycin pending results of BC ID and finalization of blood cultures.  Domingo Mend, MD [facility] Pager: [phone]

## 2017-09-26 NOTE — Progress Notes (Addendum)
Pharmacy Antibiotic Note  ARNE SCHLENDER is a 84 y.o. male admitted on 09/25/2017 with aspiration pneumonia.  Pharmacy has been consulted for Unasyn dosing.  Plan: Unasyn 3 grams IV Q 8 hours  Height: 6' (182.9 cm) Weight: 205 lb 0.4 oz (93 kg) IBW/kg (Calculated) : 77.6  Temp (24hrs), Avg:99 F (37.2 C), Min:97.3 F (36.3 C), Max:101.3 F (38.5 C)  Recent Labs  Lab 09/25/17 1452 09/25/17 1512 09/26/17 0714  WBC 11.0*  --  8.9  CREATININE 0.89  --  0.64  LATICACIDVEN  --  1.29  --     Estimated Creatinine Clearance: 70.1 mL/min (by C-G formula based on SCr of 0.64 mg/dL).    No Known Allergies  Antimicrobials this admission: Unasyn 2/6 >>      Dose adjustments this admission: N/A  Microbiology results: 2/5 BCx: NG x 24 hours 2/5 UCx: pending   2/6 MRSA PCR: negative  Thank you for allowing pharmacy to be a part of this patient's care.  Ramond Craver 09/26/2017 11:00 AM   Addum:  GPC in 1/2 BC.  Will add vancomycin 1500 mg IV x 1 then 1250 mg IV q24 hours F/u renal function, cultures, and clincial course Excell Seltzer, PharmD

## 2017-09-26 NOTE — Progress Notes (Signed)
On Admission assessment patient and sister at bedside stated that patient wishes to be a DNR. Pt is alert and oriented and so is sister. Patient told this RN as well as MD that he does not want compressions, medications, defibrillation, or intubation. Pt Code Status updated to DNR  Margaret Pyle, RN

## 2017-09-27 ENCOUNTER — Ambulatory Visit (HOSPITAL_COMMUNITY): Payer: Medicare Other | Admitting: Physical Therapy

## 2017-09-27 ENCOUNTER — Telehealth (HOSPITAL_COMMUNITY): Payer: Self-pay | Admitting: Internal Medicine

## 2017-09-27 DIAGNOSIS — J9601 Acute respiratory failure with hypoxia: Secondary | ICD-10-CM

## 2017-09-27 DIAGNOSIS — Z515 Encounter for palliative care: Secondary | ICD-10-CM

## 2017-09-27 DIAGNOSIS — J1100 Influenza due to unidentified influenza virus with unspecified type of pneumonia: Secondary | ICD-10-CM

## 2017-09-27 LAB — HEPATITIS PANEL, ACUTE
HEP A IGM: NEGATIVE
HEP B S AG: NEGATIVE
Hep B C IgM: NEGATIVE

## 2017-09-27 LAB — BASIC METABOLIC PANEL
Anion gap: 11 (ref 5–15)
BUN: 16 mg/dL (ref 6–20)
CO2: 22 mmol/L (ref 22–32)
CREATININE: 0.55 mg/dL — AB (ref 0.61–1.24)
Calcium: 8.1 mg/dL — ABNORMAL LOW (ref 8.9–10.3)
Chloride: 106 mmol/L (ref 101–111)
GFR calc Af Amer: >60 mL/min (ref >60)
GLUCOSE: 113 mg/dL — AB (ref 65–99)
Potassium: 3.7 mmol/L (ref 3.5–5.1)
SODIUM: 139 mmol/L (ref 135–145)

## 2017-09-27 LAB — CBC
HEMATOCRIT: 36.9 % — AB (ref 39.0–52.0)
Hemoglobin: 12.1 g/dL — ABNORMAL LOW (ref 13.0–17.0)
MCH: 30 pg (ref 26.0–34.0)
MCHC: 32.8 g/dL (ref 30.0–36.0)
MCV: 91.6 fL (ref 78.0–100.0)
PLATELETS: 150 10*3/uL (ref 150–400)
RBC: 4.03 MIL/uL — ABNORMAL LOW (ref 4.22–5.81)
RDW: 13.1 % (ref 11.5–15.5)
WBC: 11.1 10*3/uL — AB (ref 4.0–10.5)

## 2017-09-27 LAB — URINE CULTURE: Culture: <10000 — AB

## 2017-09-27 LAB — CULTURE, BLOOD (ROUTINE X 2): SPECIAL REQUESTS: ADEQUATE

## 2017-09-27 LAB — PROCALCITONIN: Procalcitonin: 0.49 ng/mL

## 2017-09-27 NOTE — Progress Notes (Signed)
PROGRESS NOTE    Manuel Le  VCB:[id number] DOB: Jul 28, 1927 DOA: 09/25/2017 PCP: Celene Squibb, MD     Brief Narrative:  84 year old man admitted from home on 2/5 due to tremors.  He has been complaining of a cough with yellow sputum production for the past week and on the day of admission had fever and shaking chills.  He was found to be influenza A positive and admission was requested.   Assessment & Plan:   Principal Problem:   Sepsis ([place]) Active Problems:   Fever   UTI (urinary tract infection)   Abnormal liver function   Tachycardia   Acute respiratory failure with hypoxia (HCC)   Influenza with pneumonia   Palliative care encounter   Mild sepsis -Due to influenza A and aspiration pneumonia. -Sepsis parameters have resolved.  Acute hypoxemic respiratory failure -Patient does not use oxygen at home, but is currently requiring 2 L of oxygen down from 3-4 L yesterday. -He was found to be influenza A positive, also on CT scan of the chest was found to have diffuse peri-bronchovascular density at the right lung base with partial consolidation raising concern for aspiration pneumonia. -Continue Unasyn for presumed aspiration pneumonia. -Continue Tamiflu for influenza A. -Seen by speech therapy due to concerns for aspiration pneumonia, however patient has declined further intervention.  Coag negative staph bacteremia -Was placed on vancomycin yesterday. -However this is only 1 out of 2 cultures and I believe this is likely a contaminant, will DC vancomycin.  Questionable UTI -Culture with less than 10,000 colonies.  No further antibiotics necessary for this.  Elevated troponin -Mild, no chest pain. -Likely due to acute hypoxemic respiratory failure. -EKG without acute ischemic changes. -Check 2D echo, as long as EF is intact and no wall motion abnormalities, do not anticipate further workup.   DVT prophylaxis: SCDs Code Status: DNR Family Communication: Patient  only Disposition Plan: Seen by palliative care, not interested in hospice services at this time.  Suspect patient may be ready for discharge home over the next 24-48 hours.  Consultants:   None  Procedures:   2D echo ordered, results pending  Antimicrobials:  Anti-infectives (From admission, onward)   Start     Dose/Rate Route Frequency Ordered Stop   09/27/17 1600  vancomycin (VANCOCIN) 1,250 mg in sodium chloride 0.9 % 250 mL IVPB  Status:  Discontinued     1,250 mg 166.7 mL/hr over 90 Minutes Intravenous Every 24 hours 09/26/17 1702 09/27/17 1518   09/26/17 1715  vancomycin (VANCOCIN) 1,500 mg in sodium chloride 0.9 % 500 mL IVPB     1,500 mg 250 mL/hr over 120 Minutes Intravenous  Once 09/26/17 1701 09/26/17 2222   09/26/17 1600  cefTRIAXone (ROCEPHIN) 1 g in dextrose 5 % 50 mL IVPB  Status:  Discontinued     1 g 100 mL/hr over 30 Minutes Intravenous Every 24 hours 09/26/17 0042 09/26/17 0912   09/26/17 1600  azithromycin (ZITHROMAX) 500 mg in dextrose 5 % 250 mL IVPB  Status:  Discontinued     500 mg 250 mL/hr over 60 Minutes Intravenous Every 24 hours 09/25/17 1757 09/26/17 0912   09/26/17 1200  Ampicillin-Sulbactam (UNASYN) 3 g in sodium chloride 0.9 % 100 mL IVPB     3 g 200 mL/hr over 30 Minutes Intravenous Every 8 hours 09/26/17 1045     09/26/17 0042  oseltamivir (TAMIFLU) capsule 75 mg     75 mg Oral 2 times daily 09/26/17 0042 09/30/17 2159  09/25/17 1700  oseltamivir (TAMIFLU) capsule 75 mg     75 mg Oral  Once 09/25/17 1652 09/25/17 2034   09/25/17 1445  cefTRIAXone (ROCEPHIN) 1 g in dextrose 5 % 50 mL IVPB     1 g 100 mL/hr over 30 Minutes Intravenous  Once 09/25/17 1435 09/25/17 1613   09/25/17 1445  azithromycin (ZITHROMAX) 500 mg in dextrose 5 % 250 mL IVPB     500 mg 250 mL/hr over 60 Minutes Intravenous  Once 09/25/17 1435 09/25/17 1716       Subjective: In bed, feels better than yesterday although still with significant muscle aches and  coughing.  Objective: Vitals:   09/26/17 1905 09/26/17 2042 09/27/17 0400 09/27/17 1500  BP:  (!) 160/73 (!) 151/77 140/80  Pulse: 68 67 61 64  Resp:  20 18 18   Temp:  98.4 F (36.9 C) 98.7 F (37.1 C) 98.5 F (36.9 C)  TempSrc:  Oral Oral Oral  SpO2:  96% 96% 96%  Weight:   96.6 kg (212 lb 15.4 oz)   Height:        Intake/Output Summary (Last 24 hours) at 09/27/2017 1737 Last data filed at 09/27/2017 1500 Gross per 24 hour  Intake 1180 ml  Output 1950 ml  Net -770 ml   Filed Weights   09/26/17 0012 09/26/17 0500 09/27/17 0400  Weight: 93 kg (205 lb 0.4 oz) 93 kg (205 lb 0.4 oz) 96.6 kg (212 lb 15.4 oz)    Examination:  General exam: Alert, awake, oriented x 3 Respiratory system: Coarse bilateral breath sounds Cardiovascular system:RRR. No murmurs, rubs, gallops. Gastrointestinal system: Abdomen is nondistended, soft and nontender. No organomegaly or masses felt. Normal bowel sounds heard. Central nervous system: Alert and oriented. No focal neurological deficits. Extremities: No C/C/E, +pedal pulses Skin: No rashes, lesions or ulcers Psychiatry: Judgement and insight appear normal. Mood & affect appropriate.      Data Reviewed: I have personally reviewed following labs and imaging studies  CBC: Recent Labs  Lab 09/25/17 1452 09/26/17 0714 09/27/17 0457  WBC 11.0* 8.9 11.1*  NEUTROABS 9.7*  --   --   HGB 13.8 11.7* 12.1*  HCT 42.1 35.7* 36.9*  MCV 93.1 92.2 91.6  PLT 159 134* 536   Basic Metabolic Panel: Recent Labs  Lab 09/25/17 1452 09/26/17 0714 09/27/17 0457  NA 139 140 139  K 4.1 3.5 3.7  CL 101 109 106  CO2 22 21* 22  GLUCOSE 133* 94 113*  BUN 23* 18 16  CREATININE 0.89 0.64 0.55*  CALCIUM 9.3 8.0* 8.1*   GFR: Estimated Creatinine Clearance: 76.9 mL/min (A) (by C-G formula based on SCr of 0.55 mg/dL (L)). Liver Function Tests: Recent Labs  Lab 09/25/17 1452 09/26/17 0714  AST 45* 35  ALT 33 29  ALKPHOS 50 37*  BILITOT 1.1 0.9   PROT 7.8 6.3*  ALBUMIN 3.7 2.9*   No results for input(s): LIPASE, AMYLASE in the last 168 hours. No results for input(s): AMMONIA in the last 168 hours. Coagulation Profile: No results for input(s): INR, PROTIME in the last 168 hours. Cardiac Enzymes: Recent Labs  Lab 09/25/17 1452 09/26/17 0110 09/26/17 0714 09/26/17 1249  TROPONINI 0.03* 0.03* <0.03 <0.03   BNP (last 3 results) No results for input(s): PROBNP in the last 8760 hours. HbA1C: No results for input(s): HGBA1C in the last 72 hours. CBG: No results for input(s): GLUCAP in the last 168 hours. Lipid Profile: No results for input(s): CHOL, HDL, LDLCALC,  TRIG, CHOLHDL, LDLDIRECT in the last 72 hours. Thyroid Function Tests: No results for input(s): TSH, T4TOTAL, FREET4, T3FREE, THYROIDAB in the last 72 hours. Anemia Panel: No results for input(s): VITAMINB12, FOLATE, FERRITIN, TIBC, IRON, RETICCTPCT in the last 72 hours. Urine analysis:    Component Value Date/Time   COLORURINE YELLOW 09/25/2017 1454   APPEARANCEUR CLOUDY (A) 09/25/2017 1454   LABSPEC 1.020 09/25/2017 1454   PHURINE 5.0 09/25/2017 1454   GLUCOSEU NEGATIVE 09/25/2017 1454   HGBUR SMALL (A) 09/25/2017 1454   BILIRUBINUR NEGATIVE 09/25/2017 1454   KETONESUR 5 (A) 09/25/2017 1454   PROTEINUR 100 (A) 09/25/2017 1454   UROBILINOGEN 1.0 11/27/2013 2214   NITRITE NEGATIVE 09/25/2017 1454   LEUKOCYTESUR LARGE (A) 09/25/2017 1454   Sepsis Labs: @LABRCNTIP (procalcitonin:4,lacticidven:4)  ) Recent Results (from the past 240 hour(s))  Blood Culture (routine x 2)     Status: None (Preliminary result)   Collection Time: 09/25/17  2:52 PM  Result Value Ref Range Status   Specimen Description BLOOD RIGHT ANTECUBITAL  Final   Special Requests Blood Culture adequate volume  Final   Culture   Final    NO GROWTH 2 DAYS Performed at [facility], [address] [place], [place] [address]    Report Status PENDING  Incomplete  Blood Culture (routine x 2)      Status: Abnormal   Collection Time: 09/25/17  2:52 PM  Result Value Ref Range Status   Specimen Description   Final    BLOOD LEFT HAND Performed at [facility], [address], [place], [place] [address]    Special Requests   Final    BOTTLES DRAWN AEROBIC AND ANAEROBIC Blood Culture adequate volume Performed at [facility], [address], [place], [place] [address]    Culture  Setup Time   Final    GRAM POSITIVE COCCI Gram Stain Report Called to,Read Back By and Verified With: CAMPOS G. AT [place] [id number] BY THOMPSON S. Performed at [facility], [address] [place], [place] [address]    Culture (A)  Final    STAPHYLOCOCCUS SPECIES (COAGULASE NEGATIVE) THE SIGNIFICANCE OF ISOLATING THIS ORGANISM FROM A SINGLE SET OF BLOOD CULTURES WHEN MULTIPLE SETS ARE DRAWN IS UNCERTAIN. PLEASE NOTIFY THE MICROBIOLOGY DEPARTMENT WITHIN ONE WEEK IF SPECIATION AND SENSITIVITIES ARE REQUIRED. Performed at [facility], [place] [address], [place], [place] [address]    Report Status 09/27/2017 FINAL  Final  Urine culture     Status: Abnormal   Collection Time: 09/25/17  3:52 PM  Result Value Ref Range Status   Specimen Description   Final    URINE, CLEAN CATCH Performed at [facility], [address] [place], [place] [address]    Special Requests   Final    NONE Performed at [facility], [address], [place], [place] [address]    Culture (A)  Final    <10,000 COLONIES/mL INSIGNIFICANT GROWTH Performed at [facility] [address], [place], [place] [address]    Report Status 09/27/2017 FINAL  Final  MRSA PCR Screening     Status: None   Collection Time: 09/26/17 12:47 AM  Result Value Ref Range Status   MRSA by PCR NEGATIVE NEGATIVE Final    Comment:        The GeneXpert MRSA Assay (FDA approved for NASAL specimens only), is one component of a comprehensive MRSA colonization surveillance program. It is not intended to diagnose MRSA infection nor to guide  or monitor  treatment for MRSA infections. Performed at [facility], [address] [place], [place] [address]          Radiology Studies: Ct Angio Chest Pe W Or Wo Contrast  Result Date: 09/26/2017 CLINICAL DATA:  Acute onset of productive cough and fever. Shaking chills. Elevated D-dimer. Atrial fibrillation and high blood pressure. EXAM: CT ANGIOGRAPHY CHEST WITH CONTRAST TECHNIQUE: Multidetector CT imaging of the chest was performed using the standard protocol during bolus administration of intravenous contrast. Multiplanar CT image reconstructions and MIPs were obtained to evaluate the vascular anatomy. CONTRAST:  167mL ISOVUE-370 IOPAMIDOL (ISOVUE-370) INJECTION 76% COMPARISON:  Chest radiograph performed earlier today at 3:25 p.m. FINDINGS: Cardiovascular:  There is no evidence of pulmonary embolus. The heart is normal in size. Diffuse coronary artery calcifications are seen. Scattered calcification is seen along the aortic arch and descending thoracic aorta. Scattered calcification is seen along the great vessels. Mediastinum/Nodes: A mildly enlarged 1.5 cm subcarinal node is noted. No additional mediastinal lymphadenopathy is seen. No pericardial effusion is identified. The thyroid gland is grossly unremarkable in appearance. No axillary lymphadenopathy is seen. Lungs/Pleura: There is diffuse peribronchovascular density at the right lung base, with partial consolidation of the right lung base, and obscuration of the bronchioles, raising concern for aspiration pneumonia. Scattered peripheral airspace opacities bilaterally may reflect chronic changes. No dominant mass is identified. No pleural effusion or pneumothorax is seen. Upper Abdomen: The visualized portions of the liver and spleen are unremarkable. The visualized portions of the gallbladder are within normal limits. Musculoskeletal: No acute osseous abnormalities are identified. The visualized musculature is unremarkable in appearance.  Review of the MIP images confirms the above findings. IMPRESSION: 1. No evidence of pulmonary embolus. 2. Diffuse peribronchovascular density at the right lung base, with partial consolidation of the right lung base, and obscuration of the bronchioles, raising concern for aspiration pneumonia. 3. Mildly enlarged 1.5 cm subcarinal node. 4. Diffuse coronary artery calcifications. Electronically Signed   By: Garald Balding M.D.   On: 09/26/2017 00:28   US Abdomen Limited Ruq  Result Date: 09/26/2017 CLINICAL DATA:  Abnormal LFTs, sepsis EXAM: ULTRASOUND ABDOMEN LIMITED RIGHT UPPER QUADRANT COMPARISON:  CT abdomen and pelvis 01/13/2015 FINDINGS: Gallbladder: Normally distended. Wall thickness upper normal. No shadowing calculi, pericholecystic fluid or sonographic Murphy sign. Common bile duct: Diameter: 3 mm diameter, normal Liver: Normal appearance. Portal vein is patent on color Doppler imaging with normal direction of blood flow towards the liver. No RIGHT upper quadrant free fluid. Small RIGHT pleural effusion. IMPRESSION: No acute RIGHT upper quadrant sonographic abnormalities. Incidentally noted small RIGHT pleural effusion. Electronically Signed   By: Lavonia Dana M.D.   On: 09/26/2017 13:52        Scheduled Meds: . amLODipine  10 mg Per NG tube Daily  . aspirin  325 mg Per NG tube Daily  . atorvastatin  20 mg Per NG tube q1800  . carvedilol  3.125 mg Per NG tube BID WC  . cholecalciferol  3,000 Units Oral Daily  . enoxaparin (LOVENOX) injection  40 mg Subcutaneous Q24H  . ferrous sulfate  325 mg Oral Q breakfast  . losartan  100 mg Per NG tube Daily  . oseltamivir  75 mg Oral BID  . vitamin B-12  1,000 mcg Per NG tube Daily   Continuous Infusions: . sodium chloride Stopped (09/26/17 0200)  . ampicillin-sulbactam (UNASYN) IV Stopped (09/27/17 1230)  . feeding supplement (JEVITY 1.5 CAL/FIBER)       LOS: 2 days  Time spent: 25 minutes.     Lelon Frohlich, MD [facility] Pager [phone]  If 7PM-7AM, please contact night-coverage [email or web address] Password [facility] 09/27/2017, 5:37 PM

## 2017-09-27 NOTE — Consult Note (Signed)
Consultation Note Date: 09/27/2017   Patient Name: Manuel Le  DOB: 11/20/1926  MRN: [id number]  Age / Sex: 84 y.o., male  PCP: Manuel Squibb, MD Referring Physician: Isaac Le, Manuel Le*  Reason for Consultation: Establishing goals of care  HPI/Patient Profile: 84 y.o. male  with past medical history of CAD, Afib/flutter, dysphagia, bladder cancer, and diastolic dysfunction who was admitted on 09/25/2017 with sepsis secondary to influenza A and pneumonia.  Blood cultures have grown gram positive cocci.  Current albumin is 2.9.   Clinical Assessment and Goals of Care:  I have reviewed medical records including EPIC notes, labs and imaging, received report from the care team, assessed the patient and then met at the bedside along with his sister, Manuel Le,  to discuss diagnosis prognosis, [place], EOL wishes, disposition and options.  I introduced Palliative Medicine as specialized medical care for people living with serious illness. It focuses on providing relief from the symptoms and stress of a serious illness. The goal is to improve quality of life for both the patient and the family.  We discussed a brief life review of the patient. 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  He farms as well.  He is married with no kids.  His wife has dementia and can not be alone - but she is ambulatory and doing fairly well per his report.  Manuel Le is a spiritual man and derives strength from his church family and beliefs.  Manuel Le mentioned his leg pain.  It is intermittant and bilateral.  It is throbbing in nature.  He states he has been to multiple doctors about it and tried many medications.  He received Hycet 7.5 this morning for his leg pain and it helped within 10 minutes! He was very pleased.   Manuel Le tells me he was diagnosed with  bladder cancer 2-3 years ago, but decided not to have surgery or treatment for it.  His bladder was "scraped" out and he sees his urologist every 6 months to determine if anything has changed, but he does not want to treat it.  We talked about his regurgitation and aspiration.  I described aspiration pneumonia and explained that this can be a terminal condition.  His sister Manuel Le spoke up and re-enforced my statements - describing her own troubles with reflux.  I described Home Hospice and [place] services in detail.  Manuel Le listened politely but then said, "I think you're trying to sell me something and I'm not interested".    We talked some more about his health and life.  I asked gently why he stated he was not interested in [place] services.  He responded  - but again stated "I'm not interested".     I explained that if he ever wanted more support at home - he is eligible for Hospice Services - he can go to his PCP, Manuel Le for a referral.  I left  my card and encouraged Manuel Le or Manuel Le to call me at any time.     Primary Decision Maker:  PATIENT    SUMMARY OF RECOMMENDATIONS     Patient is DNR, understands his disease process, but politely refuses home hospice services at this time.  PMT will not continue to follow in the hospital.  If he declines or we can be of support in the future, please re-consult PMT  Recommend Rx of Hycet 7.5 - 325 q6 PRN leg pain on discharge with a recommendation of senna QHS while using Hycet.   Code Status/Advance Care Planning:  DNR   Prognosis:  Very possibly less than 6 months secondary to untreated bladder cancer, recurrent aspiration, current pneumonia/bacteremia    Discharge Planning: Home with Home Health      Primary Diagnoses: Present on Admission: . Sepsis ([place]) . Fever . UTI (urinary tract infection) . Abnormal liver function   I have reviewed the medical record, interviewed the patient and  family, and examined the patient. The following aspects are pertinent.  Past Medical History:  Diagnosis Date  . Arthritis    OSTEO  . Atrial fibrillation and flutter ([place])   . Bladder cancer ([place])   . Coronary artery disease   . Dysrhythmia   . Exertional angina ([place]) 07/08/2014  . H/O urinary frequency   . Hematuria   . Hypertension   . Iron deficiency anemia   . Kidney stones    hx of   . Mild obstructive sleep apnea    no cpap  . S/P coronary artery stent placement 07/24/2014  . Weakness    Social History   Socioeconomic History  . Marital status: Married    Spouse name: None  . Number of children: None  . Years of education: None  . Highest education level: None  Social Needs  . Financial resource strain: None  . Food insecurity - worry: None  . Food insecurity - inability: None  . Transportation needs - medical: None  . Transportation needs - non-medical: None  Occupational History  . None  Tobacco Use  . Smoking status: Former Smoker    Packs/day: 2.00    Years: 20.00    Pack years: 40.00    Types: Cigarettes, Cigars, Pipe    Last attempt to quit: 08/21/1968    Years since quitting: 49.1  . Smokeless tobacco: Never Used  . Tobacco comment: quit smoking in 1980  Substance and Sexual Activity  . Alcohol use: No    Alcohol/week: 0.0 oz  . Drug use: No  . Sexual activity: None  Other Topics Concern  . None  Social History Narrative  . None   History reviewed. No pertinent family history. Scheduled Meds: . amLODipine  10 mg Per NG tube Daily  . aspirin  325 mg Per NG tube Daily  . atorvastatin  20 mg Per NG tube q1800  . carvedilol  3.125 mg Per NG tube BID WC  . cholecalciferol  3,000 Units Oral Daily  . enoxaparin (LOVENOX) injection  40 mg Subcutaneous Q24H  . ferrous sulfate  325 mg Oral Q breakfast  . losartan  100 mg Per NG tube Daily  . oseltamivir  75 mg Oral BID  . vitamin B-12  1,000 mcg Per NG tube Daily   Continuous Infusions: .  sodium chloride Stopped (09/26/17 0200)  . ampicillin-sulbactam (UNASYN) IV 3 g (09/27/17 0450)  . feeding supplement (JEVITY 1.5 CAL/FIBER)    . vancomycin  PRN Meds:.acetaminophen **OR** acetaminophen, HYDROcodone-acetaminophen, promethazine No Known Allergies Review of Systems  Complains of weakness, intermittent leg pain, fatigue and cough, hematuria.  Denies changes in bowel habits, CP, SOB  Physical Exam  Tall elderly man well developed, no apparent distress CV irreg, rate controlled Resp productive cough, no acute distress Abdomen:  Soft, nd, nt  Vital Signs: BP (!) 151/77 (BP Location: Right Arm)   Pulse 61   Temp 98.7 F (37.1 C) (Oral)   Resp 18   Ht 6' (1.829 m)   Wt 96.6 kg (212 lb 15.4 oz)   SpO2 96%   BMI 28.88 kg/m  Pain Assessment: No/denies pain       SpO2: SpO2: 96 % O2 Device:SpO2: 96 % O2 Flow Rate: .O2 Flow Rate (L/min): 2 L/min  IO: Intake/output summary:   Intake/Output Summary (Last 24 hours) at 09/27/2017 1142 Last data filed at 09/27/2017 0900 Gross per 24 hour  Intake 820 ml  Output 1300 ml  Net -480 ml    LBM: Last BM Date: 09/25/17 Baseline Weight: Weight: 93 kg (205 lb 0.4 oz) Most recent weight: Weight: 96.6 kg (212 lb 15.4 oz)     Palliative Assessment/Data: 40%     Time In: 12:00 Time Out: 12:50 Time Total: 50 min Greater than 50%  of this time was spent counseling and coordinating care related to the above assessment and plan.  Signed by: Florentina Jenny, PA-C Palliative Medicine Pager: [phone]  Please contact Palliative Medicine Team phone at [phone] for questions and concerns.  For individual provider: See Shea Evans

## 2017-09-27 NOTE — Telephone Encounter (Signed)
09/27/17  [address] RN called to cx appt... patient is in the hospital

## 2017-09-28 LAB — CBC
HEMATOCRIT: 39.6 % (ref 39.0–52.0)
HEMOGLOBIN: 13.1 g/dL (ref 13.0–17.0)
MCH: 29.6 pg (ref 26.0–34.0)
MCHC: 33.1 g/dL (ref 30.0–36.0)
MCV: 89.6 fL (ref 78.0–100.0)
Platelets: 201 10*3/uL (ref 150–400)
RBC: 4.42 MIL/uL (ref 4.22–5.81)
RDW: 12.7 % (ref 11.5–15.5)
WBC: 11.7 10*3/uL — ABNORMAL HIGH (ref 4.0–10.5)

## 2017-09-28 LAB — PROCALCITONIN: PROCALCITONIN: 0.23 ng/mL

## 2017-09-28 MED ORDER — AMOXICILLIN-POT CLAVULANATE 875-125 MG PO TABS: 1.0000 | ORAL_TABLET | Freq: Two times a day (BID) | ORAL | 0 refills | Status: AC

## 2017-09-28 MED ORDER — OSELTAMIVIR PHOSPHATE 75 MG PO CAPS: 75.0000 mg | ORAL_CAPSULE | Freq: Two times a day (BID) | ORAL | 0 refills | Status: DC

## 2017-09-28 NOTE — Discharge Summary (Signed)
Physician Discharge Summary  Manuel Le TDS:[id number] DOB: 08/25/1926 DOA: 09/25/2017  PCP: Celene Squibb, MD  Admit date: 09/25/2017 Discharge date: 09/28/2017  Time spent: 45 minutes  Recommendations for Outpatient Follow-up:  -To be discharged home today.  Discharge Diagnoses:  Principal Problem:   Sepsis ([place]) Active Problems:   Fever   UTI (urinary tract infection)   Abnormal liver function   Tachycardia   Acute respiratory failure with hypoxia (HCC)   Influenza with pneumonia   Palliative care encounter   Discharge Condition: Stable and improved  Filed Weights   09/26/17 0500 09/27/17 0400 09/28/17 0500  Weight: 93 kg (205 lb 0.4 oz) 96.6 kg (212 lb 15.4 oz) 96.6 kg (212 lb 15.4 oz)    History of present illness:  As per Dr. Maudie Mercury on 2/5: Manuel Le  is a 84 y.o. male, w Hypertension, Pafib, CAD s/p stent, nephrolithiasis, bladder cancer apparently has had cough w yellow sputum for the past 1 week and today c/o fever as well as shaking chills.  Pt presented to ED for evaluation   In ED,  Na 139, K 4.1, Bun 23, Creatinine 0.89 Ast 45, Alt 33, Alk phos 50, T. Bili 1.1  Wbc 11.0, Hgb 13.8, Plt 159 Trop 0.03  EKG ST at 102, nl axis, RBBB  Urinalysis WBC tntc, rbc 6-30 Influenza A +  Pt will be admitted for sepsis (leukocytosis, fever, tachycardia , trop elevation), secondary to UTI, ? Influenza A, bronchitis, troponin elevation.      Hospital Course:   Mild sepsis -Due to influenza A and aspiration pneumonia. -Sepsis parameters have resolved.  Acute hypoxemic respiratory failure -We have unable to wean patient completely off oxygen. -He was found to be influenza A positive, also on CT scan of the chest was found to have diffuse peri-bronchovascular density at the right lung base with partial consolidation raising concern for aspiration pneumonia. -Transition Unasyn over to Augmentin to complete 6 more days of treatment for presumed aspiration  pneumonia. -Continue Tamiflu for influenza A.  4 days remaining on discharge. -Seen by speech therapy due to concerns for aspiration pneumonia, however patient has declined further intervention.  Coag negative staph bacteremia -Was placed on vancomycin yesterday. -However this is only 1 out of 2 cultures and I believe this is likely a contaminant, will DC vancomycin.  Questionable UTI -Culture with less than 10,000 colonies.  No further antibiotics necessary for this.  Elevated troponin -Mild, no chest pain. -Likely due to acute hypoxemic respiratory failure. -EKG without acute ischemic changes. -2D echo report has been reviewed: Ejection fraction of 55-60% with normal wall motion and indeterminant grade diastolic dysfunction.     Procedures:  None   Consultations:  None  Discharge Instructions  Discharge Instructions    Diet - low sodium heart healthy   Complete by:  As directed    Increase activity slowly   Complete by:  As directed      Allergies as of 09/28/2017   No Known Allergies     Medication List    STOP taking these medications   azithromycin 250 MG tablet Commonly known as:  ZITHROMAX     TAKE these medications   amLODipine 5 MG tablet Commonly known as:  NORVASC Take 5 mg by mouth daily.   amoxicillin-clavulanate 875-125 MG tablet Commonly known as:  AUGMENTIN Take 1 tablet by mouth 2 (two) times daily for 6 days.   aspirin 325 MG tablet 1 tablet (325 mg total)  by Per NG tube route daily. What changed:  how to take this   carvedilol 3.125 MG tablet Commonly known as:  COREG 1 tablet (3.125 mg total) by Per NG tube route 2 (two) times daily with a meal. What changed:  how to take this   CENTRUM SILVER ADULT 50+ Tabs Take 1 tablet by mouth every morning.   ferrous sulfate 325 (65 FE) MG EC tablet Take 325 mg by mouth daily with breakfast.   finasteride 5 MG tablet Commonly known as:  PROSCAR Take 5 mg by mouth daily.   losartan  100 MG tablet Commonly known as:  COZAAR 1 tablet (100 mg total) by Per NG tube route daily. What changed:  how to take this   nitroGLYCERIN 0.4 MG SL tablet Commonly known as:  NITROSTAT Place 1 tablet (0.4 mg total) under the tongue every 5 (five) minutes as needed for chest pain.   oseltamivir 75 MG capsule Commonly known as:  TAMIFLU Take 1 capsule (75 mg total) by mouth 2 (two) times daily.   OSTEO BI-FLEX ADV JOINT SHIELD Tabs Take 1 tablet by mouth 2 (two) times daily.   vitamin B-12 1000 MCG tablet Commonly known as:  CYANOCOBALAMIN 1 tablet (1,000 mcg total) by Per NG tube route daily. What changed:  how to take this   Vitamin D-3 1000 units Caps 1 capsule (1,000 Units total) by Per NG tube route daily. What changed:  how to take this      No Known Allergies Follow-up Information    Celene Squibb, MD. Schedule an appointment as soon as possible for a visit in 2 week(s).   Specialty:  Internal Medicine Contact information: [place] [address] [phone]            The results of significant diagnostics from this hospitalization (including imaging, microbiology, ancillary and laboratory) are listed below for reference.    Significant Diagnostic Studies: Ct Angio Chest Pe W Or Wo Contrast  Result Date: 09/26/2017 CLINICAL DATA:  Acute onset of productive cough and fever. Shaking chills. Elevated D-dimer. Atrial fibrillation and high blood pressure. EXAM: CT ANGIOGRAPHY CHEST WITH CONTRAST TECHNIQUE: Multidetector CT imaging of the chest was performed using the standard protocol during bolus administration of intravenous contrast. Multiplanar CT image reconstructions and MIPs were obtained to evaluate the vascular anatomy. CONTRAST:  19m ISOVUE-370 IOPAMIDOL (ISOVUE-370) INJECTION 76% COMPARISON:  Chest radiograph performed earlier today at 3:25 p.m. FINDINGS: Cardiovascular:  There is no evidence of pulmonary embolus. The heart is normal in  size. Diffuse coronary artery calcifications are seen. Scattered calcification is seen along the aortic arch and descending thoracic aorta. Scattered calcification is seen along the great vessels. Mediastinum/Nodes: A mildly enlarged 1.5 cm subcarinal node is noted. No additional mediastinal lymphadenopathy is seen. No pericardial effusion is identified. The thyroid gland is grossly unremarkable in appearance. No axillary lymphadenopathy is seen. Lungs/Pleura: There is diffuse peribronchovascular density at the right lung base, with partial consolidation of the right lung base, and obscuration of the bronchioles, raising concern for aspiration pneumonia. Scattered peripheral airspace opacities bilaterally may reflect chronic changes. No dominant mass is identified. No pleural effusion or pneumothorax is seen. Upper Abdomen: The visualized portions of the liver and spleen are unremarkable. The visualized portions of the gallbladder are within normal limits. Musculoskeletal: No acute osseous abnormalities are identified. The visualized musculature is unremarkable in appearance. Review of the MIP images confirms the above findings. IMPRESSION: 1. No evidence of  pulmonary embolus. 2. Diffuse peribronchovascular density at the right lung base, with partial consolidation of the right lung base, and obscuration of the bronchioles, raising concern for aspiration pneumonia. 3. Mildly enlarged 1.5 cm subcarinal node. 4. Diffuse coronary artery calcifications. Electronically Signed   By: Garald Balding M.D.   On: 09/26/2017 00:28   Dg Chest Port 1 View  Result Date: 09/25/2017 CLINICAL DATA:  Shortness of breath EXAM: PORTABLE CHEST 1 VIEW COMPARISON:  Chest x-ray of Dec 31, 2013 FINDINGS: The lungs are borderline hypoinflated. The interstitial markings are coarse though stable. The cardiac silhouette is mildly enlarged. The pulmonary vascularity is not engorged. There calcification in the wall of the aortic arch. The  mediastinum is normal in width. There is no pleural effusion. IMPRESSION: Mild chronic bronchitic changes accentuated by borderline hypoinflation. No acute pneumonia nor CHF. Thoracic aortic atherosclerosis. Electronically Signed   By: David  Martinique M.D.   On: 09/25/2017 15:34   US Abdomen Limited Ruq  Result Date: 09/26/2017 CLINICAL DATA:  Abnormal LFTs, sepsis EXAM: ULTRASOUND ABDOMEN LIMITED RIGHT UPPER QUADRANT COMPARISON:  CT abdomen and pelvis 01/13/2015 FINDINGS: Gallbladder: Normally distended. Wall thickness upper normal. No shadowing calculi, pericholecystic fluid or sonographic Murphy sign. Common bile duct: Diameter: 3 mm diameter, normal Liver: Normal appearance. Portal vein is patent on color Doppler imaging with normal direction of blood flow towards the liver. No RIGHT upper quadrant free fluid. Small RIGHT pleural effusion. IMPRESSION: No acute RIGHT upper quadrant sonographic abnormalities. Incidentally noted small RIGHT pleural effusion. Electronically Signed   By: Lavonia Dana M.D.   On: 09/26/2017 13:52    Microbiology: Recent Results (from the past 240 hour(s))  Blood Culture (routine x 2)     Status: None (Preliminary result)   Collection Time: 09/25/17  2:52 PM  Result Value Ref Range Status   Specimen Description BLOOD RIGHT ANTECUBITAL  Final   Special Requests Blood Culture adequate volume  Final   Culture   Final    NO GROWTH 3 DAYS Performed at [facility], [address], [place], [place] [address]    Report Status PENDING  Incomplete  Blood Culture (routine x 2)     Status: Abnormal   Collection Time: 09/25/17  2:52 PM  Result Value Ref Range Status   Specimen Description   Final    BLOOD LEFT HAND Performed at [facility], [address], [place], [place] [address]    Special Requests   Final    BOTTLES DRAWN AEROBIC AND ANAEROBIC Blood Culture adequate volume Performed at [facility], [address] [place], [place] [address]    Culture  Setup Time    Final    GRAM POSITIVE COCCI Gram Stain Report Called to,Read Back By and Verified With: CAMPOS G. AT [place] [id number] BY THOMPSON S. Performed at [facility], [address] [place], [place] [address]    Culture (A)  Final    STAPHYLOCOCCUS SPECIES (COAGULASE NEGATIVE) THE SIGNIFICANCE OF ISOLATING THIS ORGANISM FROM A SINGLE SET OF BLOOD CULTURES WHEN MULTIPLE SETS ARE DRAWN IS UNCERTAIN. PLEASE NOTIFY THE MICROBIOLOGY DEPARTMENT WITHIN ONE WEEK IF SPECIATION AND SENSITIVITIES ARE REQUIRED. Performed at [facility], [place] [address] [place], [place] [address]    Report Status 09/27/2017 FINAL  Final  Urine culture     Status: Abnormal   Collection Time: 09/25/17  3:52 PM  Result Value Ref Range Status   Specimen Description   Final    URINE, CLEAN CATCH Performed  at [facility], [address] [place], [place] [address]    Special Requests   Final    NONE Performed at [facility], [address], [place], [place] [address]    Culture (A)  Final    <10,000 COLONIES/mL INSIGNIFICANT GROWTH Performed at [facility] [address], [place], [place] [address]    Report Status 09/27/2017 FINAL  Final  MRSA PCR Screening     Status: None   Collection Time: 09/26/17 12:47 AM  Result Value Ref Range Status   MRSA by PCR NEGATIVE NEGATIVE Final    Comment:        The GeneXpert MRSA Assay (FDA approved for NASAL specimens only), is one component of a comprehensive MRSA colonization surveillance program. It is not intended to diagnose MRSA infection nor to guide or monitor treatment for MRSA infections. Performed at [facility], [address], [place], [place] [address]      Labs: Basic Metabolic Panel: Recent Labs  Lab 09/25/17 1452 09/26/17 0714 09/27/17 0457  NA 139 140 139  K 4.1 3.5 3.7  CL 101 109 106  CO2 22 21* 22  GLUCOSE 133* 94 113*  BUN 23* 18 16  CREATININE 0.89 0.64 0.55*  CALCIUM 9.3 8.0* 8.1*   Liver Function Tests: Recent Labs  Lab  09/25/17 1452 09/26/17 0714  AST 45* 35  ALT 33 29  ALKPHOS 50 37*  BILITOT 1.1 0.9  PROT 7.8 6.3*  ALBUMIN 3.7 2.9*   No results for input(s): LIPASE, AMYLASE in the last 168 hours. No results for input(s): AMMONIA in the last 168 hours. CBC: Recent Labs  Lab 09/25/17 1452 09/26/17 0714 09/27/17 0457 09/28/17 0830  WBC 11.0* 8.9 11.1* 11.7*  NEUTROABS 9.7*  --   --   --   HGB 13.8 11.7* 12.1* 13.1  HCT 42.1 35.7* 36.9* 39.6  MCV 93.1 92.2 91.6 89.6  PLT 159 134* 150 201   Cardiac Enzymes: Recent Labs  Lab 09/25/17 1452 09/26/17 0110 09/26/17 0714 09/26/17 1249  TROPONINI 0.03* 0.03* <0.03 <0.03   BNP: BNP (last 3 results) No results for input(s): BNP in the last 8760 hours.  ProBNP (last 3 results) No results for input(s): PROBNP in the last 8760 hours.  CBG: No results for input(s): GLUCAP in the last 168 hours.     Signed:  Lelon Frohlich  [facility] Pager: [phone] 09/28/2017, 3:29 PM

## 2017-09-28 NOTE — Care Management Important Message (Signed)
Important Message  Patient Details  Name: YADIER BRAMHALL MRN: [id number] Date of Birth: 08/26/1926   Medicare Important Message Given:  Yes    Sherald Barge, RN 09/28/2017, 12:44 PM

## 2017-09-28 NOTE — Care Management Note (Addendum)
Case Management Note  Patient Details  Name: Manuel Le MRN: [id number] Date of Birth: 12-16-1926  Subjective/Objective:            Admitted with sepsis, flu +. Pt is from home, lives with his wife. He is ind with ADL's. He has no children or other family to help at home. His wife is older than he is. He is active with OP PT in [place]. He has cane and walker's pta. He plans to get a "scooter".   Pt has not had HH in over 15 years. He  Tells me he has his flue shot, got pnuemonia anyway and now it has turned into pneumonia. He says he feels worse today. On oxygen acutely.     Action/Plan: Anticipate DC home with resumption of OP PT. May need HH if pt continues to weaken. CM will cont to follow.  Expected Discharge Date:      10/02/17            Expected Discharge Plan:  Home/Self Care  In-House Referral:  NA  Discharge planning Services  CM Consult  Status of Service:  In process, will continue to follow  If discussed at Long Length of Stay Meetings, dates discussed:    Additional Comments:  Addendum; Pt discharging home today with self care. Family asked for PD options and CM gave guidance on how to find PD providers within the community.   Sherald Barge, RN 09/28/2017, 12:44 PM

## 2017-09-30 LAB — CULTURE, BLOOD (ROUTINE X 2)
CULTURE: NO GROWTH
Special Requests: ADEQUATE

## 2017-10-02 ENCOUNTER — Ambulatory Visit (HOSPITAL_COMMUNITY): Payer: Medicare Other

## 2017-10-02 ENCOUNTER — Telehealth (HOSPITAL_COMMUNITY): Payer: Self-pay | Admitting: Physical Therapy

## 2017-10-02 NOTE — Telephone Encounter (Signed)
He is still weak from being sick and can not walk

## 2017-10-04 ENCOUNTER — Ambulatory Visit (HOSPITAL_COMMUNITY): Payer: Medicare Other | Admitting: Physical Therapy

## 2017-10-09 ENCOUNTER — Ambulatory Visit (HOSPITAL_COMMUNITY): Payer: Medicare Other

## 2017-10-09 ENCOUNTER — Telehealth (HOSPITAL_COMMUNITY): Payer: Self-pay

## 2017-10-09 NOTE — Telephone Encounter (Signed)
Patient had the flu and was in the hospital. He is canceling for this week and will return on next week

## 2017-10-11 ENCOUNTER — Encounter (HOSPITAL_COMMUNITY): Payer: Medicare Other

## 2017-10-15 ENCOUNTER — Telehealth (HOSPITAL_COMMUNITY): Payer: Self-pay | Admitting: Physical Therapy

## 2017-10-15 NOTE — Telephone Encounter (Signed)
Patient canceled both of his appts for this week he is not feeling well

## 2017-10-16 ENCOUNTER — Ambulatory Visit (HOSPITAL_COMMUNITY): Payer: Medicare Other | Admitting: Physical Therapy

## 2017-10-18 ENCOUNTER — Encounter (HOSPITAL_COMMUNITY): Payer: Medicare Other | Admitting: Physical Therapy

## 2017-10-23 ENCOUNTER — Ambulatory Visit (HOSPITAL_COMMUNITY): Payer: Medicare Other | Attending: Internal Medicine | Admitting: Physical Therapy

## 2017-10-23 ENCOUNTER — Encounter (HOSPITAL_COMMUNITY): Payer: Self-pay | Admitting: Physical Therapy

## 2017-10-23 DIAGNOSIS — M6281 Muscle weakness (generalized): Secondary | ICD-10-CM | POA: Diagnosis not present

## 2017-10-23 DIAGNOSIS — R2689 Other abnormalities of gait and mobility: Secondary | ICD-10-CM | POA: Diagnosis not present

## 2017-10-23 DIAGNOSIS — M2560 Stiffness of unspecified joint, not elsewhere classified: Secondary | ICD-10-CM

## 2017-10-23 DIAGNOSIS — R29898 Other symptoms and signs involving the musculoskeletal system: Secondary | ICD-10-CM

## 2017-10-23 NOTE — Patient Instructions (Signed)
  SEATED MARCHING While seated in a chair, lift up your foot and knee, set it down and then perform on the other leg. Repeat this alternating movement. 20x total alternating legs Repeat 20 Times Hold 1 Second Complete 1 Set Perform 1 Time(s) a Day   HEEL RAISES - PLANTARFLEXION - BILATERAL Start with your entire foot on the ground. Next, raise up your heels as you press your toes down. Keep your toes on the ground the entire time. 15 x  Repeat 15 Times Hold 1 Second Complete 1 Set Perform 1 Time(s) a Day   LONG ARC QUAD - LAQ - HIGH SEAT While seated with your knee in a bent position, slowly straighten your knee as you raise your foot upwards as shown. 15x each leg  Repeat 15 Times Hold 1 Second Complete 1 Set Perform 1 Time(s) a Day   HIP ABDUCTION - BILATERAL- SEATED Start by sitting close to the edge of a chair with knees bent and both feet on the floor. Next, move your knees out to the side as shown and then return to straight ahead. Maintain contact of your feet on the floor the entire time.  Repeat 15 Times Hold 1 Second Complete 1 Set Perform 1 Time(s) a Day

## 2017-10-23 NOTE — Therapy (Signed)
[place], [place], [address] Phone: [phone]   Fax:  [phone]  Physical Therapy Treatment / Re-assessment  Patient Details  Name: Manuel Le MRN: [id number] Date of Birth: Aug 14, 1927 Referring Provider: Dr. Jenny Reichmann "[name]" [name]   Encounter Date: 10/23/2017  PT End of Session - 10/23/17 1500    Visit Number  2    Number of Visits  17    Date for PT Re-Evaluation  11/16/17    Authorization Type  Medicare     Authorization Time Period  09/18/17 - 11/16/17    Authorization - Visit Number  2    Authorization - Number of Visits  10    PT Start Time  6962    PT Stop Time  1435    PT Time Calculation (min)  47 min    Equipment Utilized During Treatment  Gait belt    Activity Tolerance  No increased pain;Patient limited by fatigue;Patient tolerated treatment well    Behavior During Therapy  [facility] for tasks assessed/performed       Past Medical History:  Diagnosis Date  . Arthritis    OSTEO  . Atrial fibrillation and flutter ([place])   . Bladder cancer ([place])   . Coronary artery disease   . Dysrhythmia   . Exertional angina ([place]) 07/08/2014  . H/O urinary frequency   . Hematuria   . Hypertension   . Iron deficiency anemia   . Kidney stones    hx of   . Mild obstructive sleep apnea    no cpap  . S/P coronary artery stent placement 07/24/2014  . Weakness     Past Surgical History:  Procedure Laterality Date  . CARDIAC CATHETERIZATION    . CARDIOVASCULAR STRESS TEST  12-15-2013  DR Kate Sable ([place])   MILD - MODERATE PERI-INFARCT ISCHEMIA  OF INFERIOR WALL, MID-INFEROSEPTAL WALL, MID-INFEROLATERAL WALL, & BASAL INFERIOR WALL/   NORMAL LVF /  EF 59%/  INTERMEDIATE RISK STUDY  . COLONOSCOPY  07/04/2012   Procedure: COLONOSCOPY;  Surgeon: Rogene Houston, MD;  [facility];  Service: Endoscopy;  Laterality: N/A;  320  . CORONARY STENT PLACEMENT  07/08/2014   PTCA of RCA   DR  COOPER  . CYSTOSCOPY W/ RETROGRADES Bilateral 12/31/2013   Procedure: CYSTOSCOPY WITH RETROGRADE PYELOGRAM;  Surgeon: Alexis Frock, MD;  Location: [facility];  Service: Urology;  Laterality: Bilateral;  . CYSTOSCOPY W/ RETROGRADES Bilateral 01/20/2015   Procedure: CYSTOSCOPY WITH RETROGRADE PYELOGRAM;  Surgeon: Alexis Frock, MD;  Location: [facility];  Service: Urology;  Laterality: Bilateral;  . ESOPHAGOGASTRODUODENOSCOPY  07/04/2012   Procedure: ESOPHAGOGASTRODUODENOSCOPY (EGD);  Surgeon: Rogene Houston, MD;  [facility];  Service: Endoscopy;  Laterality: N/A;  . EYE SURGERY     past cataract surgery bilat  . HERNIA REPAIR Right   . LEFT HEART CATHETERIZATION WITH CORONARY ANGIOGRAM N/A 07/08/2014   Procedure: LEFT HEART CATHETERIZATION WITH CORONARY ANGIOGRAM;  Surgeon: Blane Ohara, MD;  Location: [facility] CATH LAB;  Service: Cardiovascular;  Laterality: N/A;  . PERCUTANEOUS CORONARY STENT INTERVENTION (PCI-S)  07/08/2014   Procedure: PERCUTANEOUS CORONARY STENT INTERVENTION (PCI-S);  Surgeon: Blane Ohara, MD;  Location: [facility] CATH LAB;  Service: Cardiovascular;;  . TOTAL KNEE ARTHROPLASTY Bilateral 2000  &  2003?  . TRANSTHORACIC ECHOCARDIOGRAM  09-11-2013   MODERATE LVH/  EF 55-60%/  MILD MR & TR/  MILD TO MODERATE CALCIFIED AV WITHOUT STENOSIS/  MILD  LAE/   MODERATE PR  . TRANSURETHRAL RESECTION OF BLADDER TUMOR N/A 01/20/2015   Procedure: TRANSURETHRAL RESECTION OF BLADDER TUMOR (TURBT);  Surgeon: Alexis Frock, MD;  Location[facility];  Service: Urology;  Laterality: N/A;  . TRANSURETHRAL RESECTION OF BLADDER TUMOR WITH GYRUS (TURBT-GYRUS) N/A 12/31/2013   Procedure: TRANSURETHRAL RESECTION OF BLADDER TUMOR WITH GYRUS (TURBT-GYRUS);  Surgeon: Alexis Frock, MD;  Location: [facility];  Service: Urology;  Laterality: N/A;  . ZENKER'S DIVERTICULECTOMY N/A 09/09/2015   Procedure: ENDOSCOPIC ZENKER'S DIVERTICULECTOMY;  Surgeon: Izora Gala, MD;  Location: [place];  Service: ENT;  Laterality:  N/A;  . ZENKER'S DIVERTICULECTOMY ENDOSCOPIC  09/09/2015    There were no vitals filed for this visit.  Subjective Assessment - 10/23/17 1458    Subjective  This session patient reported he was in the hospital for the flu and for pneumonia. He stated that he is feeling better, but that he is very weak. Patient denied any leg pain currently, but reported that over the last week his pain could reach a 6/10.     Pertinent History  Bladder cancer; Dysphagia; cardiac arrhythmia; hypertension; Atrial fibrillation; bilateral knee replacements    Limitations  Standing;Walking;House hold activities    How long can you sit comfortably?  Not limited    How long can you stand comfortably?  5 minutes; legs give out legs hurt     How long can you walk comfortably?  20 feet; feels like legs might give out    Diagnostic tests  CT of head on 03/06/17: Spondylosis greatest on left C5-C6; negative for acute intracranial findings    Patient Stated Goals  To improve strength in legs and balance    Currently in Pain?  No/denies    Multiple Pain Sites  No         [facility] PT Assessment - 10/23/17 0001      Assessment   Medical Diagnosis  Weakness in Left Leg, strength and balance training    Referring Provider  Dr. Jenny Reichmann "[name]" [place]      Balance Screen   Has the patient fallen in the past 6 months  -- Patient reported he was fallen 2 additional times      Prior Function   Level of Independence  Independent      Cognition   Overall Cognitive Status  Within Functional Limits for tasks assessed      Observation/Other Assessments   Focus on Therapeutic Outcomes (FOTO)   12% (88% limitation)      Sensation   Additional Comments  Patient denied any tingling or numbness in lower extremities      Posture/Postural Control   Posture/Postural Control  Postural limitations    Postural Limitations  Anterior pelvic tilt;Flexed trunk    Posture Comments  Anterior weight shift in standing and walking      AROM    Right Knee Extension  10    Right Knee Flexion  115    Left Knee Extension  5    Left Knee Flexion  101      Strength   Right Hip Flexion  3/5    Right Hip ABduction  4/5    Left Hip Flexion  3-/5    Left Hip ABduction  4/5    Right Knee Flexion  4/5    Right Knee Extension  4+/5    Left Knee Flexion  3+/5    Left Knee Extension  3+/5    Right Ankle Dorsiflexion  4+/5  Right Ankle Plantar Flexion  3+/5    Left Ankle Dorsiflexion  4+/5    Left Ankle Plantar Flexion  3+/5      Flexibility   Hamstrings  Severely limited bilaterally with 90/90 hamstring assessment      Transfers   Comments  Patient required increased time for all bed mobility; with sit to stand transfer patient required use of bilateral upper extremities and several attempts      Ambulation/Gait   Ambulation/Gait  Yes    Ambulation Distance (Feet)  68 Feet 3MWT. 2 sitting breaks    Assistive device  Small based quad cane    Gait Pattern  Decreased step length - right;Decreased step length - left;Decreased stance time - right;Decreased stance time - left;Decreased stride length;Decreased hip/knee flexion - right;Decreased hip/knee flexion - left    Ambulation Surface  Level    Gait velocity  0.12 m/s  3MWT with 2 sitting breaks      Static Standing Balance   Static Standing - Balance Support  No upper extremity supported    Static Standing Balance -  Activities   Single Leg Stance - Right Leg;Single Leg Stance - Left Leg    Static Standing - Comment/# of Minutes  SLS 0 seconds bilaterally; unable to attain tandem position      Timed Up and Go Test   TUG  Normal TUG    Normal TUG (seconds)  29.46    TUG Comments  Patient performed with quad cane and with use of upper extremities to stand      RLE Tone   RLE Tone  Other (comment) Difficult to assess patient guarding      LLE Tone   LLE Tone  Other (comment) Difficult to assess patient guarding                  [facility] Adult PT Treatment/Exercise  - 10/23/17 0001      Knee/Hip Exercises: Seated   Long Arc Quad  AROM;Strengthening;Right;Left;1 set;15 reps    Other Seated Knee/Hip Exercises  Heel raises x 15 seated    Marching  Strengthening;Right;Left;20 reps;Limitations    Marching Limitations  Alternating legs    Abduction/Adduction   AROM;Strengthening;Right;Left;1 set;15 reps    Abd/Adduction Limitations  15 repetitions           Balance Exercises - 10/23/17 1448      Balance Exercises: Standing   Standing Eyes Opened  Foam/compliant surface;3 reps;30 secs;Narrow base of support (BOS);Limitations Intermittent upper extremity support        PT Education - 10/23/17 1459    Education provided  Yes    Education Details  Patient was educated on examination findings, on plan of care, and on home exercise program.     Person(s) Educated  Patient    Methods  Explanation;Handout;Verbal cues;Tactile cues    Comprehension  Verbalized understanding;Returned demonstration;Need further instruction       PT Short Term Goals - 10/23/17 1449      PT SHORT TERM GOAL #1   Title  Patient will demonstrate understanding a report regular compliance with home exercise program.     Time  4    Period  Weeks    Status  On-going      PT SHORT TERM GOAL #2   Title  Patient will demonstrate improved MMT score by 1/2 grade in all deficient planes of lower extremities in order to assist with functional mobility.     Baseline  10/23/17: Patient  demonstrated weakness in bilateral lower extremities still    Time  4    Period  Weeks    Status  On-going      PT SHORT TERM GOAL #3   Title  Patient will report ability to stand for 10 minutes in order to assist with improved performance of functional actitivities at home.     Baseline  10/23/17: Patient reports that he feels he cannot stand for more than 5 minutes currently.     Time  4    Period  Weeks    Status  On-going      PT SHORT TERM GOAL #4   Title  Patient will report pain in legs no  greater than 4/10 over the course of a 1 week period indicating improved activity tolerance and improved quality of life.     Baseline  10/23/17: Patient reported no pain currently, but a 6/10 pain in his legs over the last week.     Time  4    Period  Weeks    Status  On-going        PT Long Term Goals - 10/23/17 1450      PT LONG TERM GOAL #1   Title  Patient will demonstrate improved MMT score by 1 grade in all deficient planes of lower extremities in order to assist with functional mobility.     Baseline  10/23/17: Patient has demonstrated similar strength deficits in bilateral lower extremities.     Time  8    Period  Weeks    Status  On-going      PT LONG TERM GOAL #2   Title  Patient's FOTO score will improve by 15% indicating patient's improved perceived functional ability and better tolerance of functional activities.     Baseline  10/23/17: Patient's FOTO score is now at 12% or 88% limited.     Time  8    Period  Weeks    Status  On-going      PT LONG TERM GOAL #3   Title  Patient will demonstrate ability to perform TUG in 14 seconds or less indicating a decreased risk of falls.     Baseline  10/23/17: Patient performed TUG in 29.46 seconds this session while using a quad cane and upper extremity assistance to stand up from chair.     Time  8    Period  Weeks    Status  On-going            Plan - 10/23/17 1501    Clinical Impression Statement  This session performed a re-assessment of patient as he has not been back to therapy in over a month and was admitted to the hospital for the flu and for pneumonia. Patient presented today stating that he is feeling much better but that he is weak. With re-assessment patient demonstrated that his lower extremity strength was grossly the same as at initial evaluation although some areas were slightly weaker. Patient's O2 saturation was monitored throughout session and found to be between 97-98%.  Patient continued to demonstrate  difficulty with ambulation and balance as well. Following the re-assessment, therapist instructed patient on initiating a seated home exercise program for lower extremity strengthening. In addition, patient performed some standing therapeutic exercises. Patient would benefit from continued skilled physical therapy in order to address the abovementioned deficits and reach goals.     Rehab Potential  Fair    Clinical Impairments Affecting Rehab Potential  Positve: Patient's motivation,  patient's positive attitude. Negative: Chronicity of issue; frequent falls; medical complexity     PT Frequency  2x / week    PT Duration  8 weeks    PT Treatment/Interventions  ADLs/Self Care Home Management;Aquatic Therapy;DME Instruction;Gait training;Stair training;Functional mobility training;Therapeutic activities;Therapeutic exercise;Balance training;Neuromuscular re-education;Patient/family education;Manual techniques;Passive range of motion;Energy conservation    PT Next Visit Plan  Review HEP; test hip extension strength; follow up about medications; perform functional strengthening for lower extremities such as sit to stand and test stair ambulation; trial ambulation with rolling walker; continue to assess patient's lower extremity tone    PT Home Exercise Plan  Seated heelraises 15 x 1x/day; LAQ x 15 each leg 1x/day; Hip Abduction/adduction x 15 1x/day; Marching alternating legs x 20 total 1x/day    Consulted and Agree with Plan of Care  Patient       Patient will benefit from skilled therapeutic intervention in order to improve the following deficits and impairments:  Abnormal gait, Decreased balance, Decreased endurance, Decreased mobility, Difficulty walking, Hypomobility, Decreased range of motion, Improper body mechanics, Impaired tone, Decreased activity tolerance, Decreased coordination, Decreased strength, Impaired flexibility, Postural dysfunction, Pain  Visit Diagnosis: Weakness of left leg  Muscle  weakness (generalized)  Decreased range of motion  Balance problem  Other abnormalities of gait and mobility     Problem List Patient Active Problem List   Diagnosis Date Noted  . Acute respiratory failure with hypoxia ([place])   . Influenza with pneumonia   . Palliative care encounter   . Sepsis ([place]) 09/25/2017  . Fever 09/25/2017  . UTI (urinary tract infection) 09/25/2017  . Abnormal liver function 09/25/2017  . Tachycardia 09/25/2017  . Influenza A   . Pressure ulcer 09/14/2015  . Zenker's diverticulum 09/09/2015  . Bladder cancer ([place]) 07/14/2015  . CAD (coronary artery disease) 07/24/2014  . S/P coronary artery stent placement (RCA bare metal stent on 07/08/14) 07/24/2014  . Exertional angina ([place]) 07/08/2014  . Encounter for therapeutic drug monitoring 09/12/2013  . Atrial fibrillation ([place]) 09/08/2013  . HTN (hypertension) 09/08/2013  . Fatigue 09/08/2013  . Anemia 06/26/2012    Clarene Critchley 10/23/2017, 3:09 PM  [place], [place], [address] Phone: [phone]   Fax:  [phone]  Name: Manuel Le MRN: [id number] Date of Birth: 06-Apr-1927

## 2017-10-25 ENCOUNTER — Encounter (HOSPITAL_COMMUNITY): Payer: Self-pay | Admitting: Physical Therapy

## 2017-10-25 ENCOUNTER — Ambulatory Visit (HOSPITAL_COMMUNITY): Payer: Medicare Other | Admitting: Physical Therapy

## 2017-10-25 DIAGNOSIS — M6281 Muscle weakness (generalized): Secondary | ICD-10-CM | POA: Diagnosis not present

## 2017-10-25 DIAGNOSIS — M2560 Stiffness of unspecified joint, not elsewhere classified: Secondary | ICD-10-CM

## 2017-10-25 DIAGNOSIS — R29898 Other symptoms and signs involving the musculoskeletal system: Secondary | ICD-10-CM | POA: Diagnosis not present

## 2017-10-25 DIAGNOSIS — R2689 Other abnormalities of gait and mobility: Secondary | ICD-10-CM

## 2017-10-25 NOTE — Therapy (Signed)
[place], [place], [address] Phone: [phone]   Fax:  [phone]  Physical Therapy Treatment  Patient Details  Name: Manuel Le MRN: [id number] Date of Birth: 19-Apr-1927 Referring Provider: Dr. Jenny Reichmann "Manuel Le" Manuel Le   Encounter Date: 10/25/2017  PT End of Session - 10/25/17 1445    Visit Number  3    Number of Visits  17    Date for PT Re-Evaluation  11/16/17    Authorization Type  Medicare     Authorization Time Period  09/18/17 - 11/16/17    Authorization - Visit Number  1    Authorization - Number of Visits  10    PT Start Time  0626    PT Stop Time  1430    PT Time Calculation (min)  42 min    Equipment Utilized During Treatment  Gait belt    Activity Tolerance  No increased pain;Patient limited by fatigue;Patient tolerated treatment well    Behavior During Therapy  [facility] for tasks assessed/performed       Past Medical History:  Diagnosis Date  . Arthritis    OSTEO  . Atrial fibrillation and flutter ([place])   . Bladder cancer ([place])   . Coronary artery disease   . Dysrhythmia   . Exertional angina ([place]) 07/08/2014  . H/O urinary frequency   . Hematuria   . Hypertension   . Iron deficiency anemia   . Kidney stones    hx of   . Mild obstructive sleep apnea    no cpap  . S/P coronary artery stent placement 07/24/2014  . Weakness     Past Surgical History:  Procedure Laterality Date  . CARDIAC CATHETERIZATION    . CARDIOVASCULAR STRESS TEST  12-15-2013  DR Kate Sable ([place])   MILD - MODERATE PERI-INFARCT ISCHEMIA  OF INFERIOR WALL, MID-INFEROSEPTAL WALL, MID-INFEROLATERAL WALL, & BASAL INFERIOR WALL/   NORMAL LVF /  EF 59%/  INTERMEDIATE RISK STUDY  . COLONOSCOPY  07/04/2012   Procedure: COLONOSCOPY;  Surgeon: Rogene Houston, MD;  [facility];  Service: Endoscopy;  Laterality: N/A;  320  . CORONARY STENT PLACEMENT  07/08/2014   PTCA of RCA   DR COOPER  . CYSTOSCOPY  W/ RETROGRADES Bilateral 12/31/2013   Procedure: CYSTOSCOPY WITH RETROGRADE PYELOGRAM;  Surgeon: Alexis Frock, MD;  [facility];  Service: Urology;  Laterality: Bilateral;  . CYSTOSCOPY W/ RETROGRADES Bilateral 01/20/2015   Procedure: CYSTOSCOPY WITH RETROGRADE PYELOGRAM;  Surgeon: Alexis Frock, MD;  [facility];  Service: Urology;  Laterality: Bilateral;  . ESOPHAGOGASTRODUODENOSCOPY  07/04/2012   Procedure: ESOPHAGOGASTRODUODENOSCOPY (EGD);  Surgeon: Rogene Houston, MD;  [facility];  Service: Endoscopy;  Laterality: N/A;  . EYE SURGERY     past cataract surgery bilat  . HERNIA REPAIR Right   . LEFT HEART CATHETERIZATION WITH CORONARY ANGIOGRAM N/A 07/08/2014   Procedure: LEFT HEART CATHETERIZATION WITH CORONARY ANGIOGRAM;  Surgeon: Blane Ohara, MD;  Location: [facility] CATH LAB;  Service: Cardiovascular;  Laterality: N/A;  . PERCUTANEOUS CORONARY STENT INTERVENTION (PCI-S)  07/08/2014   Procedure: PERCUTANEOUS CORONARY STENT INTERVENTION (PCI-S);  Surgeon: Blane Ohara, MD;  Location: [facility] CATH LAB;  Service: Cardiovascular;;  . TOTAL KNEE ARTHROPLASTY Bilateral 2000  &  2003?  . TRANSTHORACIC ECHOCARDIOGRAM  09-11-2013   MODERATE LVH/  EF 55-60%/  MILD MR & TR/  MILD TO MODERATE CALCIFIED AV WITHOUT STENOSIS/  MILD LAE/  MODERATE PR  . TRANSURETHRAL RESECTION OF BLADDER TUMOR N/A 01/20/2015   Procedure: TRANSURETHRAL RESECTION OF BLADDER TUMOR (TURBT);  Surgeon: Alexis Frock, MD;  [facility];  Service: Urology;  Laterality: N/A;  . TRANSURETHRAL RESECTION OF BLADDER TUMOR WITH GYRUS (TURBT-GYRUS) N/A 12/31/2013   Procedure: TRANSURETHRAL RESECTION OF BLADDER TUMOR WITH GYRUS (TURBT-GYRUS);  Surgeon: Alexis Frock, MD;  [facility];  Service: Urology;  Laterality: N/A;  . ZENKER'S DIVERTICULECTOMY N/A 09/09/2015   Procedure: ENDOSCOPIC ZENKER'S DIVERTICULECTOMY;  Surgeon: Izora Gala, MD;  Location: [place];  Service: ENT;  Laterality: N/A;  . ZENKER'S  DIVERTICULECTOMY ENDOSCOPIC  09/09/2015    There were no vitals filed for this visit.  Subjective Assessment - 10/25/17 1351    Subjective  Patient reported he is not having any pain today and that he did his home exercises yesterday.     Pertinent History  Bladder cancer; Dysphagia; cardiac arrhythmia; hypertension; Atrial fibrillation; bilateral knee replacements    Limitations  Standing;Walking;House hold activities    Diagnostic tests  CT of head on 03/06/17: Spondylosis greatest on left C5-C6; negative for acute intracranial findings    Patient Stated Goals  To improve strength in legs and balance    Currently in Pain?  No/denies    Multiple Pain Sites  No         [facility] PT Assessment - 10/25/17 0001      Strength   Right Hip Extension  2+/5 Measured in standing    Left Hip Extension  2+/5 Measured in standing                  [facility] Adult PT Treatment/Exercise - 10/25/17 0001      Knee/Hip Exercises: Stretches   Passive Hamstring Stretch  Right;Left;3 reps;30 seconds Seated with leg on 8 inch step      Knee/Hip Exercises: Standing   Hip Extension  Stengthening;Right;Left;1 set;10 reps;Knee bent Verbal cues to keep leg straight, patient continued to bend     Other Standing Knee Exercises  Side stepping in parallel bars 2 x 6 feet each direction with red theraband above thighs      Knee/Hip Exercises: Seated   Long Arc Quad  AROM;Strengthening;Right;Left;1 set;15 reps    Other Seated Knee/Hip Exercises  Heel raises x 15 seated    Marching  Strengthening;Right;Left;20 reps;Limitations    Marching Limitations  Alternating legs    Abduction/Adduction   AROM;Strengthening;Right;Left;1 set;15 reps    Abd/Adduction Limitations  15 repetitions     Sit to Sand  2 sets;10 reps;without UE support;Other (comment) From 23 inch mat table          Balance Exercises - 10/25/17 1354      Balance Exercises: Standing   Standing Eyes Opened  Foam/compliant surface;3 reps;30  secs;Narrow base of support (BOS);Limitations;Other (comment) Intermittent upper extremity support    Tandem Stance  Eyes open;Foam/compliant surface;4 reps 4 repetitions each LE forward. Intermittent UE assist.    Step Ups  4 inch;Forward Tapping foot onto 4 inch step 2 x 8 with minimal guarding    Tandem Gait  Forward;Upper extremity support;Other reps (comment) 2 x 14 feet semi-tandem with minimal guard using SBQC    Retro Gait  Other (comment) 14 feet x 2 with minimal guarding and VCs to stand tall    Step Over Hurdles / Cones  In parallel bars stepping over 3 4 inch hurdles x 4  Patient required upper extremity assist throughout  PT Education - 10/25/17 1444    Education provided  Yes    Education Details  Patient was educated on purpose and technique of exercises throughout session.     Person(s) Educated  Patient    Methods  Explanation;Demonstration;Tactile cues;Verbal cues    Comprehension  Verbalized understanding;Returned demonstration;Need further instruction;Tactile cues required;Verbal cues required       PT Short Term Goals - 10/23/17 1449      PT SHORT TERM GOAL #1   Title  Patient will demonstrate understanding a report regular compliance with home exercise program.     Time  4    Period  Weeks    Status  On-going      PT SHORT TERM GOAL #2   Title  Patient will demonstrate improved MMT score by 1/2 grade in all deficient planes of lower extremities in order to assist with functional mobility.     Baseline  10/23/17: Patient demonstrated weakness in bilateral lower extremities still    Time  4    Period  Weeks    Status  On-going      PT SHORT TERM GOAL #3   Title  Patient will report ability to stand for 10 minutes in order to assist with improved performance of functional actitivities at home.     Baseline  10/23/17: Patient reports that he feels he cannot stand for more than 5 minutes currently.     Time  4    Period  Weeks    Status  On-going      PT  SHORT TERM GOAL #4   Title  Patient will report pain in legs no greater than 4/10 over the course of a 1 week period indicating improved activity tolerance and improved quality of life.     Baseline  10/23/17: Patient reported no pain currently, but a 6/10 pain in his legs over the last week.     Time  4    Period  Weeks    Status  On-going        PT Long Term Goals - 10/23/17 1450      PT LONG TERM GOAL #1   Title  Patient will demonstrate improved MMT score by 1 grade in all deficient planes of lower extremities in order to assist with functional mobility.     Baseline  10/23/17: Patient has demonstrated similar strength deficits in bilateral lower extremities.     Time  8    Period  Weeks    Status  On-going      PT LONG TERM GOAL #2   Title  Patient's FOTO score will improve by 15% indicating patient's improved perceived functional ability and better tolerance of functional activities.     Baseline  10/23/17: Patient's FOTO score is now at 12% or 88% limited.     Time  8    Period  Weeks    Status  On-going      PT LONG TERM GOAL #3   Title  Patient will demonstrate ability to perform TUG in 14 seconds or less indicating a decreased risk of falls.     Baseline  10/23/17: Patient performed TUG in 29.46 seconds this session while using a quad cane and upper extremity assistance to stand up from chair.     Time  8    Period  Weeks    Status  On-going            Plan - 10/25/17 1447  Clinical Impression Statement  Today's session began with patient performing exercises from home exercise program. Then session progressed to patient performing standing strengthening exercises. Patient required frequent verbal cues with hip extension to maintain his knee straight, however patient continued to demonstrate a bent knee. Patient was able to perform sit to stands from a 23 inch mat this session without use of his upper extremities. The session included balance exercises both in the  parallel bars and outside of the parallel bars. Patient required minimal guarding throughout balance activities and occasional minimal assistance to regain balance. Patient had difficulty with stepping over hurdles inside the parallel bars and required upper extremity assistance thoughout and stated that this was something that is challenging for him. Patient required frequent therapeutic rest breaks throughout session. Patient's oxygen saturation was monitored throughout the session and found to be between 97 and 99% throughout. Patient stated he did not bring his medication list and therapist requested that he bring it to next visit. Patient would benefit from continued skilled physical therapy to continue addressing patient's deficits in strength, balance, flexibility, ambulation, and overall functional mobility.     History and Personal Factors relevant to plan of care:  Bladder cancer; bilateral knee replacements; spondylosis at C5-C6; frequent falls; hypertension; atrial fibrillation    Rehab Potential  Fair    Clinical Impairments Affecting Rehab Potential  Positve: Patient's motivation, patient's positive attitude. Negative: Chronicity of issue; frequent falls; medical complexity     PT Frequency  2x / week    PT Duration  8 weeks    PT Treatment/Interventions  ADLs/Self Care Home Management;Aquatic Therapy;DME Instruction;Gait training;Stair training;Functional mobility training;Therapeutic activities;Therapeutic exercise;Balance training;Neuromuscular re-education;Patient/family education;Manual techniques;Passive range of motion;Energy conservation    PT Next Visit Plan  Follow up about medications; perform functional strengthening for lower extremities such as sit to stand and try step ups; potentially trial ambulation with rolling walker; continue balance training; gait training    PT Home Exercise Plan  Seated heelraises 15 x 1x/day; LAQ x 15 each leg 1x/day; Hip Abduction/adduction x 15  1x/day; Marching alternating legs x 20 total 1x/day    Consulted and Agree with Plan of Care  Patient       Patient will benefit from skilled therapeutic intervention in order to improve the following deficits and impairments:  Abnormal gait, Decreased balance, Decreased endurance, Decreased mobility, Difficulty walking, Hypomobility, Decreased range of motion, Improper body mechanics, Impaired tone, Decreased activity tolerance, Decreased coordination, Decreased strength, Impaired flexibility, Postural dysfunction, Pain  Visit Diagnosis: Weakness of left leg  Muscle weakness (generalized)  Decreased range of motion  Balance problem  Other abnormalities of gait and mobility     Problem List Patient Active Problem List   Diagnosis Date Noted  . Acute respiratory failure with hypoxia ([place])   . Influenza with pneumonia   . Palliative care encounter   . Sepsis ([place]) 09/25/2017  . Fever 09/25/2017  . UTI (urinary tract infection) 09/25/2017  . Abnormal liver function 09/25/2017  . Tachycardia 09/25/2017  . Influenza A   . Pressure ulcer 09/14/2015  . Zenker's diverticulum 09/09/2015  . Bladder cancer ([place]) 07/14/2015  . CAD (coronary artery disease) 07/24/2014  . S/P coronary artery stent placement (RCA bare metal stent on 07/08/14) 07/24/2014  . Exertional angina ([place]) 07/08/2014  . Encounter for therapeutic drug monitoring 09/12/2013  . Atrial fibrillation ([place]) 09/08/2013  . HTN (hypertension) 09/08/2013  . Fatigue 09/08/2013  . Anemia 06/26/2012   Clarene Critchley PT, DPT 3:04 PM,  10/25/17 [place], [place], [address] Phone: [phone]   Fax:  [phone]  Name: TUDOR CHANDLEY MRN: [id number] Date of Birth: [birth year]

## 2017-10-30 ENCOUNTER — Ambulatory Visit (HOSPITAL_COMMUNITY): Payer: Medicare Other

## 2017-10-30 ENCOUNTER — Encounter (HOSPITAL_COMMUNITY): Payer: Self-pay

## 2017-10-30 VITALS — HR 71

## 2017-10-30 DIAGNOSIS — M6281 Muscle weakness (generalized): Secondary | ICD-10-CM | POA: Diagnosis not present

## 2017-10-30 DIAGNOSIS — M2560 Stiffness of unspecified joint, not elsewhere classified: Secondary | ICD-10-CM

## 2017-10-30 DIAGNOSIS — R29898 Other symptoms and signs involving the musculoskeletal system: Secondary | ICD-10-CM

## 2017-10-30 DIAGNOSIS — R2689 Other abnormalities of gait and mobility: Secondary | ICD-10-CM | POA: Diagnosis not present

## 2017-10-30 NOTE — Therapy (Signed)
[place], [place], [address] Phone: [phone]   Fax:  [phone]  Physical Therapy Treatment  Patient Details  Name: Manuel Le MRN: [id number] Date of Birth: [birth year] Referring Provider: Dr. Delaine Lame   Encounter Date: 10/30/2017  PT End of Session - 10/30/17 1355    Visit Number  4    Number of Visits  17    Date for PT Re-Evaluation  11/16/17    Authorization Type  Medicare     Authorization Time Period  09/18/17 - 11/16/17    PT Start Time  1351    PT Stop Time  1429    PT Time Calculation (min)  38 min    Equipment Utilized During Treatment  Gait belt    Activity Tolerance  No increased pain;Patient limited by fatigue;Patient tolerated treatment well    Behavior During Therapy  [facility] for tasks assessed/performed       Past Medical History:  Diagnosis Date  . Arthritis    OSTEO  . Atrial fibrillation and flutter ([place])   . Bladder cancer ([place])   . Coronary artery disease   . Dysrhythmia   . Exertional angina ([place]) 07/08/2014  . H/O urinary frequency   . Hematuria   . Hypertension   . Iron deficiency anemia   . Kidney stones    hx of   . Mild obstructive sleep apnea    no cpap  . S/P coronary artery stent placement 07/24/2014  . Weakness     Past Surgical History:  Procedure Laterality Date  . CARDIAC CATHETERIZATION    . CARDIOVASCULAR STRESS TEST  12-15-2013  DR Kate Sable ([place])   MILD - MODERATE PERI-INFARCT ISCHEMIA  OF INFERIOR WALL, MID-INFEROSEPTAL WALL, MID-INFEROLATERAL WALL, & BASAL INFERIOR WALL/   NORMAL LVF /  EF 59%/  INTERMEDIATE RISK STUDY  . COLONOSCOPY  07/04/2012   Procedure: COLONOSCOPY;  Surgeon: Rogene Houston, MD;  [facility];  Service: Endoscopy;  Laterality: N/A;  320  . CORONARY STENT PLACEMENT  07/08/2014   PTCA of RCA   DR COOPER  . CYSTOSCOPY W/ RETROGRADES Bilateral 12/31/2013   Procedure: CYSTOSCOPY WITH RETROGRADE  PYELOGRAM;  Surgeon: Alexis Frock, MD;  [facility];  Service: Urology;  Laterality: Bilateral;  . CYSTOSCOPY W/ RETROGRADES Bilateral 01/20/2015   Procedure: CYSTOSCOPY WITH RETROGRADE PYELOGRAM;  Surgeon: Alexis Frock, MD;  [facility];  Service: Urology;  Laterality: Bilateral;  . ESOPHAGOGASTRODUODENOSCOPY  07/04/2012   Procedure: ESOPHAGOGASTRODUODENOSCOPY (EGD);  Surgeon: Rogene Houston, MD;  [facility];  Service: Endoscopy;  Laterality: N/A;  . EYE SURGERY     past cataract surgery bilat  . HERNIA REPAIR Right   . LEFT HEART CATHETERIZATION WITH CORONARY ANGIOGRAM N/A 07/08/2014   Procedure: LEFT HEART CATHETERIZATION WITH CORONARY ANGIOGRAM;  Surgeon: Blane Ohara, MD;  Location: [facility] CATH LAB;  Service: Cardiovascular;  Laterality: N/A;  . PERCUTANEOUS CORONARY STENT INTERVENTION (PCI-S)  07/08/2014   Procedure: PERCUTANEOUS CORONARY STENT INTERVENTION (PCI-S);  Surgeon: Blane Ohara, MD;  Location: [facility] CATH LAB;  Service: Cardiovascular;;  . TOTAL KNEE ARTHROPLASTY Bilateral 2000  &  2003?  . TRANSTHORACIC ECHOCARDIOGRAM  09-11-2013   MODERATE LVH/  EF 55-60%/  MILD MR & TR/  MILD TO MODERATE CALCIFIED AV WITHOUT STENOSIS/  MILD LAE/   MODERATE PR  . TRANSURETHRAL RESECTION OF BLADDER TUMOR N/A 01/20/2015   Procedure: TRANSURETHRAL RESECTION OF BLADDER  TUMOR (TURBT);  Surgeon: Alexis Frock, MD;  [facility];  Service: Urology;  Laterality: N/A;  . TRANSURETHRAL RESECTION OF BLADDER TUMOR WITH GYRUS (TURBT-GYRUS) N/A 12/31/2013   Procedure: TRANSURETHRAL RESECTION OF BLADDER TUMOR WITH GYRUS (TURBT-GYRUS);  Surgeon: Alexis Frock, MD;  [facility];  Service: Urology;  Laterality: N/A;  . ZENKER'S DIVERTICULECTOMY N/A 09/09/2015   Procedure: ENDOSCOPIC ZENKER'S DIVERTICULECTOMY;  Surgeon: Izora Gala, MD;  Location: [place];  Service: ENT;  Laterality: N/A;  . ZENKER'S DIVERTICULECTOMY ENDOSCOPIC  09/09/2015    Vitals:   10/30/17 1356  Pulse: 71   SpO2: 97%    Subjective Assessment - 10/30/17 1355    Subjective  Pt stated he is feeling good today, no reports of pain or recent falls.      Pertinent History  Bladder cancer; Dysphagia; cardiac arrhythmia; hypertension; Atrial fibrillation; bilateral knee replacements    Patient Stated Goals  To improve strength in legs and balance    Currently in Pain?  No/denies         [facility] PT Assessment - 10/30/17 0001      Assessment   Medical Diagnosis  Weakness in Left Leg, strength and balance training    Referring Provider  Dr. Delaine Lame                  [facility] Adult PT Treatment/Exercise - 10/30/17 0001      Knee/Hip Exercises: Seated   Long Arc Quad  AROM;Strengthening;Right;Left;1 set;15 reps    Sit to [employer]  2 sets;10 reps;without UE support;Other (comment) eccentric control          Balance Exercises - 10/30/17 1430      Balance Exercises: Standing   Tandem Stance  Eyes open;Foam/compliant surface;3 reps;30 secs intermittent HHA    Step Ups  4 inch;Forward;Intermittent UE support increase to 6in next session    Tandem Gait  Forward;Intermittent upper extremity support;2 reps inside // bars    Retro Gait  2 reps 2RT inside // bars, intermittent HHA    Sidestepping  2 reps inside // bars    Step Over Hurdles / Cones  3RT 6in and 12in intermittent HHA, inside // bars    Marching Limitations  2 sets x 10 reps x3" holds 1 HHA cueing for posture          PT Short Term Goals - 10/23/17 1449      PT SHORT TERM GOAL #1   Title  Patient will demonstrate understanding a report regular compliance with home exercise program.     Time  4    Period  Weeks    Status  On-going      PT SHORT TERM GOAL #2   Title  Patient will demonstrate improved MMT score by 1/2 grade in all deficient planes of lower extremities in order to assist with functional mobility.     Baseline  10/23/17: Patient demonstrated weakness in bilateral lower extremities still    Time  4     Period  Weeks    Status  On-going      PT SHORT TERM GOAL #3   Title  Patient will report ability to stand for 10 minutes in order to assist with improved performance of functional actitivities at home.     Baseline  10/23/17: Patient reports that he feels he cannot stand for more than 5 minutes currently.     Time  4    Period  Weeks    Status  On-going      PT SHORT TERM GOAL #4   Title  Patient will report pain in legs no greater than 4/10 over the course of a 1 week period indicating improved activity tolerance and improved quality of life.     Baseline  10/23/17: Patient reported no pain currently, but a 6/10 pain in his legs over the last week.     Time  4    Period  Weeks    Status  On-going        PT Long Term Goals - 10/23/17 1450      PT LONG TERM GOAL #1   Title  Patient will demonstrate improved MMT score by 1 grade in all deficient planes of lower extremities in order to assist with functional mobility.     Baseline  10/23/17: Patient has demonstrated similar strength deficits in bilateral lower extremities.     Time  8    Period  Weeks    Status  On-going      PT LONG TERM GOAL #2   Title  Patient's FOTO score will improve by 15% indicating patient's improved perceived functional ability and better tolerance of functional activities.     Baseline  10/23/17: Patient's FOTO score is now at 12% or 88% limited.     Time  8    Period  Weeks    Status  On-going      PT LONG TERM GOAL #3   Title  Patient will demonstrate ability to perform TUG in 14 seconds or less indicating a decreased risk of falls.     Baseline  10/23/17: Patient performed TUG in 29.46 seconds this session while using a quad cane and upper extremity assistance to stand up from chair.     Time  8    Period  Weeks    Status  On-going            Plan - 10/30/17 1855    Clinical Impression Statement  Session focus on functional strengthening and balance training.  Vitals were assessed with O2 sat at  97% through session.  Noted decrease heart rate following first exercise, monitored on different finger with vitals WNL.  Rest breaks required frequency due to fatigue.  Pt progressed well with ability to reduce HHA and increased height with hurdles today with assistance for safety.  Cueing required to improve posture and focal points with balance activities to assist.  No reports of pain through session.    Rehab Potential  Fair    Clinical Impairments Affecting Rehab Potential  Positve: Patient's motivation, patient's positive attitude. Negative: Chronicity of issue; frequent falls; medical complexity     PT Frequency  2x / week    PT Duration  8 weeks    PT Treatment/Interventions  ADLs/Self Care Home Management;Aquatic Therapy;DME Instruction;Gait training;Stair training;Functional mobility training;Therapeutic activities;Therapeutic exercise;Balance training;Neuromuscular re-education;Patient/family education;Manual techniques;Passive range of motion;Energy conservation    PT Next Visit Plan  Continue to perform functional strengthening for lower extremities such as sit to stand and try step ups (increase to 6in next session) ; potentially trial ambulation with rolling walker; continue balance training; gait training    PT Home Exercise Plan  Seated heelraises 15 x 1x/day; LAQ x 15 each leg 1x/day; Hip Abduction/adduction x 15 1x/day; Marching alternating legs x 20 total 1x/day       Patient will benefit from skilled therapeutic intervention in order to improve the following deficits and impairments:  Abnormal gait, Decreased balance, Decreased  endurance, Decreased mobility, Difficulty walking, Hypomobility, Decreased range of motion, Improper body mechanics, Impaired tone, Decreased activity tolerance, Decreased coordination, Decreased strength, Impaired flexibility, Postural dysfunction, Pain  Visit Diagnosis: Weakness of left leg  Muscle weakness (generalized)  Decreased range of  motion  Balance problem  Other abnormalities of gait and mobility     Problem List Patient Active Problem List   Diagnosis Date Noted  . Acute respiratory failure with hypoxia ([place])   . Influenza with pneumonia   . Palliative care encounter   . Sepsis ([place]) 09/25/2017  . Fever 09/25/2017  . UTI (urinary tract infection) 09/25/2017  . Abnormal liver function 09/25/2017  . Tachycardia 09/25/2017  . Influenza A   . Pressure ulcer 09/14/2015  . Zenker's diverticulum 09/09/2015  . Bladder cancer ([place]) 07/14/2015  . CAD (coronary artery disease) 07/24/2014  . S/P coronary artery stent placement (RCA bare metal stent on 07/08/14) 07/24/2014  . Exertional angina ([place]) 07/08/2014  . Encounter for therapeutic drug monitoring 09/12/2013  . Atrial fibrillation ([place]) 09/08/2013  . HTN (hypertension) 09/08/2013  . Fatigue 09/08/2013  . Anemia 06/26/2012   Ihor Austin, LPTA; [place]  Aldona Lento 10/30/2017, 7:02 PM  [place] [address] [place], [place], [address] Phone: [phone]   Fax:  [phone]  Name: Manuel Le MRN: [id number] Date of Birth: [birth year]

## 2017-11-01 ENCOUNTER — Encounter (HOSPITAL_COMMUNITY): Payer: Self-pay | Admitting: Physical Therapy

## 2017-11-01 ENCOUNTER — Ambulatory Visit (HOSPITAL_COMMUNITY): Payer: Medicare Other | Admitting: Physical Therapy

## 2017-11-01 DIAGNOSIS — M6281 Muscle weakness (generalized): Secondary | ICD-10-CM | POA: Diagnosis not present

## 2017-11-01 DIAGNOSIS — R29898 Other symptoms and signs involving the musculoskeletal system: Secondary | ICD-10-CM

## 2017-11-01 DIAGNOSIS — R2689 Other abnormalities of gait and mobility: Secondary | ICD-10-CM

## 2017-11-01 DIAGNOSIS — M2560 Stiffness of unspecified joint, not elsewhere classified: Secondary | ICD-10-CM | POA: Diagnosis not present

## 2017-11-01 NOTE — Therapy (Addendum)
[place], [place], [address] Phone: [phone]   Fax:  [phone]  Physical Therapy Treatment  Patient Details  Name: Manuel Le MRN: [id number] Date of Birth: [birth year] Referring Provider: Dr. Delaine Lame   Encounter Date: 11/01/2017  PT End of Session - 11/01/17 1455    Visit Number  5    Number of Visits  17    Date for PT Re-Evaluation  11/16/17    Authorization Type  Medicare     Authorization Time Period  09/18/17 - 11/16/17    PT Start Time  1347    PT Stop Time  1429    PT Time Calculation (min)  42 min    Equipment Utilized During Treatment  Gait belt    Activity Tolerance  No increased pain;Patient limited by fatigue;Patient tolerated treatment well    Behavior During Therapy  [facility] for tasks assessed/performed       Past Medical History:  Diagnosis Date  . Arthritis    OSTEO  . Atrial fibrillation and flutter ([place])   . Bladder cancer ([place])   . Coronary artery disease   . Dysrhythmia   . Exertional angina ([place]) 07/08/2014  . H/O urinary frequency   . Hematuria   . Hypertension   . Iron deficiency anemia   . Kidney stones    hx of   . Mild obstructive sleep apnea    no cpap  . S/P coronary artery stent placement 07/24/2014  . Weakness     Past Surgical History:  Procedure Laterality Date  . CARDIAC CATHETERIZATION    . CARDIOVASCULAR STRESS TEST  12-15-2013  DR Kate Sable ([place])   MILD - MODERATE PERI-INFARCT ISCHEMIA  OF INFERIOR WALL, MID-INFEROSEPTAL WALL, MID-INFEROLATERAL WALL, & BASAL INFERIOR WALL/   NORMAL LVF /  EF 59%/  INTERMEDIATE RISK STUDY  . COLONOSCOPY  07/04/2012   Procedure: COLONOSCOPY;  Surgeon: Rogene [place], MD;  [facility];  Service: Endoscopy;  Laterality: N/A;  320  . CORONARY STENT PLACEMENT  07/08/2014   PTCA of RCA   DR COOPER  . CYSTOSCOPY W/ RETROGRADES Bilateral 12/31/2013   Procedure: CYSTOSCOPY WITH RETROGRADE  PYELOGRAM;  Surgeon: Alexis Frock, MD;  [facility];  Service: Urology;  Laterality: Bilateral;  . CYSTOSCOPY W/ RETROGRADES Bilateral 01/20/2015   Procedure: CYSTOSCOPY WITH RETROGRADE PYELOGRAM;  Surgeon: Alexis Frock, MD;  [facility];  Service: Urology;  Laterality: Bilateral;  . ESOPHAGOGASTRODUODENOSCOPY  07/04/2012   Procedure: ESOPHAGOGASTRODUODENOSCOPY (EGD);  Surgeon: Rogene Houston, MD;  [facility];  Service: Endoscopy;  Laterality: N/A;  . EYE SURGERY     past cataract surgery bilat  . HERNIA REPAIR Right   . LEFT HEART CATHETERIZATION WITH CORONARY ANGIOGRAM N/A 07/08/2014   Procedure: LEFT HEART CATHETERIZATION WITH CORONARY ANGIOGRAM;  Surgeon: Blane Ohara, MD;  [facility] CATH LAB;  Service: Cardiovascular;  Laterality: N/A;  . PERCUTANEOUS CORONARY STENT INTERVENTION (PCI-S)  07/08/2014   Procedure: PERCUTANEOUS CORONARY STENT INTERVENTION (PCI-S);  Surgeon: Blane Ohara, MD;  Location: [facility] CATH LAB;  Service: Cardiovascular;;  . TOTAL KNEE ARTHROPLASTY Bilateral 2000  &  2003?  . TRANSTHORACIC ECHOCARDIOGRAM  09-11-2013   MODERATE LVH/  EF 55-60%/  MILD MR & TR/  MILD TO MODERATE CALCIFIED AV WITHOUT STENOSIS/  MILD LAE/   MODERATE PR  . TRANSURETHRAL RESECTION OF BLADDER TUMOR N/A 01/20/2015   Procedure: TRANSURETHRAL RESECTION OF BLADDER  TUMOR (TURBT);  Surgeon: Alexis Frock, MD;  [facility];  Service: Urology;  Laterality: N/A;  . TRANSURETHRAL RESECTION OF BLADDER TUMOR WITH GYRUS (TURBT-GYRUS) N/A 12/31/2013   Procedure: TRANSURETHRAL RESECTION OF BLADDER TUMOR WITH GYRUS (TURBT-GYRUS);  Surgeon: Alexis Frock, MD;  [facility];  Service: Urology;  Laterality: N/A;  . ZENKER'S DIVERTICULECTOMY N/A 09/09/2015   Procedure: ENDOSCOPIC ZENKER'S DIVERTICULECTOMY;  Surgeon: Izora Gala, MD;  Location: [place];  Service: ENT;  Laterality: N/A;  . ZENKER'S DIVERTICULECTOMY ENDOSCOPIC  09/09/2015    There were no vitals filed for this  visit.  Subjective Assessment - 11/01/17 1349    Subjective  Patient reported that he is not having any pain and hasn't had any recent falls.     Pertinent History  Bladder cancer; Dysphagia; cardiac arrhythmia; hypertension; Atrial fibrillation; bilateral knee replacements    Patient Stated Goals  To improve strength in legs and balance    Currently in Pain?  No/denies    Multiple Pain Sites  No                      [facility] Adult PT Treatment/Exercise - 11/01/17 0001      Knee/Hip Exercises: Seated   Long Arc Quad  AROM;Strengthening;Right;Left;1 set;10 reps;Weights;Other (comment) 2# ankle weights; verbal and tactile cues for posture    Sit to Sand  2 sets;10 reps;without UE support;Other (comment)      Knee/Hip Exercises: Supine   Quad Sets  Other (comment) From mat elevated to 23 inches VCs for eccentric control      Knee/Hip Exercises: Sidelying   Clams  Sidelying 1 x 15 each lower extremity           Balance Exercises - 11/01/17 1351      Balance Exercises: Standing   Tandem Stance  Eyes open;Foam/compliant surface;3 reps;30 secs;Other (comment) Each lower extremity; intermittent upper extremity support    Step Ups  6 inch;UE support 1 1 x 10 each lower extremity    Tandem Gait  Forward;Intermittent upper extremity support;Other (comment) Inside parallel bars x 3 repetitions    Retro Gait  3 reps;Other (comment) Inside parallel bars intermittent HHA    Step Over Hurdles / Cones  Inside parallel bars stepping over 6 inch hurdles x 4. Stepping over 12 inch hurdles x 2. Intermittent upper extremity support.     Marching Limitations  2 sets x 10 reps x3" holds intermittent 1 HHA cueing for posture        PT Education - 11/01/17 1454    Education provided  Yes    Education Details  Patient was educated on posture throughout session and educated on purpose and technique of exercises throughout.     Person(s) Educated  Patient    Methods   Explanation;Demonstration;Tactile cues;Verbal cues    Comprehension  Returned demonstration;Need further instruction;Tactile cues required;Verbal cues required;Verbalized understanding       PT Short Term Goals - 10/23/17 1449      PT SHORT TERM GOAL #1   Title  Patient will demonstrate understanding a report regular compliance with home exercise program.     Time  4    Period  Weeks    Status  On-going      PT SHORT TERM GOAL #2   Title  Patient will demonstrate improved MMT score by 1/2 grade in all deficient planes of lower extremities in order to assist with functional mobility.     Baseline  10/23/17: Patient  demonstrated weakness in bilateral lower extremities still    Time  4    Period  Weeks    Status  On-going      PT SHORT TERM GOAL #3   Title  Patient will report ability to stand for 10 minutes in order to assist with improved performance of functional actitivities at home.     Baseline  10/23/17: Patient reports that he feels he cannot stand for more than 5 minutes currently.     Time  4    Period  Weeks    Status  On-going      PT SHORT TERM GOAL #4   Title  Patient will report pain in legs no greater than 4/10 over the course of a 1 week period indicating improved activity tolerance and improved quality of life.     Baseline  10/23/17: Patient reported no pain currently, but a 6/10 pain in his legs over the last week.     Time  4    Period  Weeks    Status  On-going        PT Long Term Goals - 10/23/17 1450      PT LONG TERM GOAL #1   Title  Patient will demonstrate improved MMT score by 1 grade in all deficient planes of lower extremities in order to assist with functional mobility.     Baseline  10/23/17: Patient has demonstrated similar strength deficits in bilateral lower extremities.     Time  8    Period  Weeks    Status  On-going      PT LONG TERM GOAL #2   Title  Patient's FOTO score will improve by 15% indicating patient's improved perceived functional  ability and better tolerance of functional activities.     Baseline  10/23/17: Patient's FOTO score is now at 12% or 88% limited.     Time  8    Period  Weeks    Status  On-going      PT LONG TERM GOAL #3   Title  Patient will demonstrate ability to perform TUG in 14 seconds or less indicating a decreased risk of falls.     Baseline  10/23/17: Patient performed TUG in 29.46 seconds this session while using a quad cane and upper extremity assistance to stand up from chair.     Time  8    Period  Weeks    Status  On-going            Plan - 11/01/17 1456    Clinical Impression Statement  This session continued to focus on lower extremity strengthening as well as balance activities. This session added clams in sidelying to improve patient's hip abduction strength. This session also increased the step height to 6 inches. With hurdles patient knocked over a couple of the 12 inch hurdles and demonstrated hip abduction rather than hip flexion with these. Next session continue with 6 inch hurdles. Patient was asked how he was feeling throughout session and he reported he was feeling good. However, patient requested frequent therapeutic rest breaks. Patient's oxygen saturation was monitored throughout the session and remained between 99% and 96%. Patient would benefit from continued skilled physical therapy in order to continue addressing patient's deficits in strength, balance, and overall functional mobility.     Rehab Potential  Fair    Clinical Impairments Affecting Rehab Potential  Positve: Patient's motivation, patient's positive attitude. Negative: Chronicity of issue; frequent falls; medical complexity  PT Frequency  2x / week    PT Duration  8 weeks    PT Treatment/Interventions  ADLs/Self Care Home Management;Aquatic Therapy;DME Instruction;Gait training;Stair training;Functional mobility training;Therapeutic activities;Therapeutic exercise;Balance training;Neuromuscular  re-education;Patient/family education;Manual techniques;Passive range of motion;Energy conservation    PT Next Visit Plan  Continue to perform functional strengthening for lower extremities such as sit to stand and continue step ups 6in; potentially trial ambulation with rolling walker; continue balance training; gait training; add stepping toward targets for balance    PT Home Exercise Plan  Seated heelraises 15 x 1x/day; LAQ x 15 each leg 1x/day; Hip Abduction/adduction x 15 1x/day; Marching alternating legs x 20 total 1x/day       Patient will benefit from skilled therapeutic intervention in order to improve the following deficits and impairments:  Abnormal gait, Decreased balance, Decreased endurance, Decreased mobility, Difficulty walking, Hypomobility, Decreased range of motion, Improper body mechanics, Impaired tone, Decreased activity tolerance, Decreased coordination, Decreased strength, Impaired flexibility, Postural dysfunction, Pain  Visit Diagnosis: Weakness of left leg  Muscle weakness (generalized)  Decreased range of motion  Balance problem  Other abnormalities of gait and mobility     Problem List Patient Active Problem List   Diagnosis Date Noted  . Acute respiratory failure with hypoxia ([place])   . Influenza with pneumonia   . Palliative care encounter   . Sepsis ([place]) 09/25/2017  . Fever 09/25/2017  . UTI (urinary tract infection) 09/25/2017  . Abnormal liver function 09/25/2017  . Tachycardia 09/25/2017  . Influenza A   . Pressure ulcer 09/14/2015  . Zenker's diverticulum 09/09/2015  . Bladder cancer ([place]) 07/14/2015  . CAD (coronary artery disease) 07/24/2014  . S/P coronary artery stent placement (RCA bare metal stent on 07/08/14) 07/24/2014  . Exertional angina ([place]) 07/08/2014  . Encounter for therapeutic drug monitoring 09/12/2013  . Atrial fibrillation ([place]) 09/08/2013  . HTN (hypertension) 09/08/2013  . Fatigue 09/08/2013  . Anemia 06/26/2012    Clarene Critchley PT, DPT 5:12 PM, 11/01/17 [place], [place], [address] Phone: [phone]   Fax:  [phone]  Name: Manuel Le MRN: [id number] Date of Birth: [birth year]

## 2017-11-06 ENCOUNTER — Telehealth (HOSPITAL_COMMUNITY): Payer: Self-pay | Admitting: Physical Therapy

## 2017-11-06 ENCOUNTER — Encounter (HOSPITAL_COMMUNITY): Payer: Self-pay | Admitting: Physical Therapy

## 2017-11-06 ENCOUNTER — Ambulatory Visit (HOSPITAL_COMMUNITY): Payer: Medicare Other | Admitting: Physical Therapy

## 2017-11-06 DIAGNOSIS — R2689 Other abnormalities of gait and mobility: Secondary | ICD-10-CM | POA: Diagnosis not present

## 2017-11-06 DIAGNOSIS — M2560 Stiffness of unspecified joint, not elsewhere classified: Secondary | ICD-10-CM | POA: Diagnosis not present

## 2017-11-06 DIAGNOSIS — R29898 Other symptoms and signs involving the musculoskeletal system: Secondary | ICD-10-CM | POA: Diagnosis not present

## 2017-11-06 DIAGNOSIS — M6281 Muscle weakness (generalized): Secondary | ICD-10-CM | POA: Diagnosis not present

## 2017-11-06 NOTE — Therapy (Signed)
[place], [place], [address] Phone: [phone]   Fax:  [phone]  Physical Therapy Treatment  Patient Details  Name: Manuel Le MRN: [id number] Date of Birth: [birth year] Referring Provider: Dr. Delaine Lame   Encounter Date: 11/06/2017  PT End of Session - 11/06/17 1536    Visit Number  6    Number of Visits  17    Date for PT Re-Evaluation  11/16/17    Authorization Type  Medicare     Authorization Time Period  09/18/17 - 11/16/17    PT Start Time  1351 Patient used restroom    PT Stop Time  1430    PT Time Calculation (min)  39 min    Equipment Utilized During Treatment  Gait belt    Activity Tolerance  No increased pain;Patient limited by fatigue;Patient tolerated treatment well    Behavior During Therapy  [facility] for tasks assessed/performed       Past Medical History:  Diagnosis Date  . Arthritis    OSTEO  . Atrial fibrillation and flutter ([place])   . Bladder cancer ([place])   . Coronary artery disease   . Dysrhythmia   . Exertional angina ([place]) 07/08/2014  . H/O urinary frequency   . Hematuria   . Hypertension   . Iron deficiency anemia   . Kidney stones    hx of   . Mild obstructive sleep apnea    no cpap  . S/P coronary artery stent placement 07/24/2014  . Weakness     Past Surgical History:  Procedure Laterality Date  . CARDIAC CATHETERIZATION    . CARDIOVASCULAR STRESS TEST  12-15-2013  DR Kate Sable ([place])   MILD - MODERATE PERI-INFARCT ISCHEMIA  OF INFERIOR WALL, MID-INFEROSEPTAL WALL, MID-INFEROLATERAL WALL, & BASAL INFERIOR WALL/   NORMAL LVF /  EF 59%/  INTERMEDIATE RISK STUDY  . COLONOSCOPY  07/04/2012   Procedure: COLONOSCOPY;  Surgeon: Rogene Houston, MD;  [facility];  Service: Endoscopy;  Laterality: N/A;  320  . CORONARY STENT PLACEMENT  07/08/2014   PTCA of RCA   DR COOPER  . CYSTOSCOPY W/ RETROGRADES Bilateral 12/31/2013   Procedure:  CYSTOSCOPY WITH RETROGRADE PYELOGRAM;  Surgeon: Alexis Frock, MD;  [facility];  Service: Urology;  Laterality: Bilateral;  . CYSTOSCOPY W/ RETROGRADES Bilateral 01/20/2015   Procedure: CYSTOSCOPY WITH RETROGRADE PYELOGRAM;  Surgeon: Alexis Frock, MD;  [facility];  Service: Urology;  Laterality: Bilateral;  . ESOPHAGOGASTRODUODENOSCOPY  07/04/2012   Procedure: ESOPHAGOGASTRODUODENOSCOPY (EGD);  Surgeon: Rogene Houston, MD;  [facility];  Service: Endoscopy;  Laterality: N/A;  . EYE SURGERY     past cataract surgery bilat  . HERNIA REPAIR Right   . LEFT HEART CATHETERIZATION WITH CORONARY ANGIOGRAM N/A 07/08/2014   Procedure: LEFT HEART CATHETERIZATION WITH CORONARY ANGIOGRAM;  Surgeon: Blane Ohara, MD;  Location: [facility] CATH LAB;  Service: Cardiovascular;  Laterality: N/A;  . PERCUTANEOUS CORONARY STENT INTERVENTION (PCI-S)  07/08/2014   Procedure: PERCUTANEOUS CORONARY STENT INTERVENTION (PCI-S);  Surgeon: Blane Ohara, MD;  Location: [facility] CATH LAB;  Service: Cardiovascular;;  . TOTAL KNEE ARTHROPLASTY Bilateral 2000  &  2003?  . TRANSTHORACIC ECHOCARDIOGRAM  09-11-2013   MODERATE LVH/  EF 55-60%/  MILD MR & TR/  MILD TO MODERATE CALCIFIED AV WITHOUT STENOSIS/  MILD LAE/   MODERATE PR  . TRANSURETHRAL RESECTION OF BLADDER TUMOR N/A 01/20/2015   Procedure: TRANSURETHRAL  RESECTION OF BLADDER TUMOR (TURBT);  Surgeon: Alexis Frock, MD;  [facility];  Service: Urology;  Laterality: N/A;  . TRANSURETHRAL RESECTION OF BLADDER TUMOR WITH GYRUS (TURBT-GYRUS) N/A 12/31/2013   Procedure: TRANSURETHRAL RESECTION OF BLADDER TUMOR WITH GYRUS (TURBT-GYRUS);  Surgeon: Alexis Frock, MD;  [facility];  Service: Urology;  Laterality: N/A;  . ZENKER'S DIVERTICULECTOMY N/A 09/09/2015   Procedure: ENDOSCOPIC ZENKER'S DIVERTICULECTOMY;  Surgeon: Izora Gala, MD;  Location: [facility];  Service: ENT;  Laterality: N/A;  . ZENKER'S DIVERTICULECTOMY ENDOSCOPIC  09/09/2015    There  were no vitals filed for this visit.  Subjective Assessment - 11/06/17 1534    Subjective  Patient denied any pain currently and reported that he has been doing his exercises at home.     Pertinent History  Bladder cancer; Dysphagia; cardiac arrhythmia; hypertension; Atrial fibrillation; bilateral knee replacements    Patient Stated Goals  To improve strength in legs and balance    Currently in Pain?  No/denies    Multiple Pain Sites  No                      [facility] Adult PT Treatment/Exercise - 11/06/17 0001      Knee/Hip Exercises: Stretches   Passive Hamstring Stretch  Right;Left;3 reps;30 seconds Seated with leg on 6 inch step      Knee/Hip Exercises: Seated   Sit to Sand  2 sets;without UE support;Other (comment) From chair. First set 10 repetitions. Second set 5 reps.           Balance Exercises - 11/06/17 1400      Balance Exercises: Standing   Tandem Stance  Eyes open;Foam/compliant surface;3 reps;30 secs;Other (comment) Each lower etremity forward intermittent upper extremity    SLS  Eyes open;Solid surface;Upper extremity support 1;5 reps;Other (comment) Each lower extremity    SLS with Vectors  Solid surface;Upper extremity assist 2;3 reps;Limitations 3 vectors x3 on each lower extremity. Stepping toward vector    Step Ups  6 inch;UE support 1 1 x 10 each lower extremity    Tandem Gait  Forward;Intermittent upper extremity support;Other (comment) 4 repetitions inside parallel bars     Retro Gait  Other (comment);4 reps Inside parallel bars intermittent upper extremity support    Step Over Hurdles / Cones  Inside parallel bars stepping over 3 6 inch hurdles x 6. Intermittent upper extremity support.         PT Education - 11/06/17 1536    Education provided  Yes    Education Details  Patient was educated on purpose and technique of exercises throughout session.     Person(s) Educated  Patient    Methods  Explanation;Demonstration;Tactile cues;Verbal  cues    Comprehension  Verbalized understanding;Returned demonstration;Verbal cues required;Tactile cues required;Need further instruction       PT Short Term Goals - 10/23/17 1449      PT SHORT TERM GOAL #1   Title  Patient will demonstrate understanding a report regular compliance with home exercise program.     Time  4    Period  Weeks    Status  On-going      PT SHORT TERM GOAL #2   Title  Patient will demonstrate improved MMT score by 1/2 grade in all deficient planes of lower extremities in order to assist with functional mobility.     Baseline  10/23/17: Patient demonstrated weakness in bilateral lower extremities still    Time  4  Period  Weeks    Status  On-going      PT SHORT TERM GOAL #3   Title  Patient will report ability to stand for 10 minutes in order to assist with improved performance of functional actitivities at home.     Baseline  10/23/17: Patient reports that he feels he cannot stand for more than 5 minutes currently.     Time  4    Period  Weeks    Status  On-going      PT SHORT TERM GOAL #4   Title  Patient will report pain in legs no greater than 4/10 over the course of a 1 week period indicating improved activity tolerance and improved quality of life.     Baseline  10/23/17: Patient reported no pain currently, but a 6/10 pain in his legs over the last week.     Time  4    Period  Weeks    Status  On-going        PT Long Term Goals - 10/23/17 1450      PT LONG TERM GOAL #1   Title  Patient will demonstrate improved MMT score by 1 grade in all deficient planes of lower extremities in order to assist with functional mobility.     Baseline  10/23/17: Patient has demonstrated similar strength deficits in bilateral lower extremities.     Time  8    Period  Weeks    Status  On-going      PT LONG TERM GOAL #2   Title  Patient's FOTO score will improve by 15% indicating patient's improved perceived functional ability and better tolerance of functional  activities.     Baseline  10/23/17: Patient's FOTO score is now at 12% or 88% limited.     Time  8    Period  Weeks    Status  On-going      PT LONG TERM GOAL #3   Title  Patient will demonstrate ability to perform TUG in 14 seconds or less indicating a decreased risk of falls.     Baseline  10/23/17: Patient performed TUG in 29.46 seconds this session while using a quad cane and upper extremity assistance to stand up from chair.     Time  8    Period  Weeks    Status  On-going            Plan - 11/06/17 1537    Clinical Impression Statement  This session continued to focus on improving the patient's lower extremity strength with functional activities and with balance. Patient required frequent therapeutic rest breaks throughout session. Patient's oxygen saturation was monitored throughout session and found to be between 97% and 99% throughout. This session increased the number of repetitions with tandem ambulation and retro ambulation. This session also added stepping toward vectors practice for balance. Patient demonstrated ability to perform sit to stands from a chair without upper extremities. Patient would benefit from continued skilled physical therapy in order to continue addressing patient's deficits in strength, flexibility, balance, endurance, and overall functional mobility.     Rehab Potential  Fair    Clinical Impairments Affecting Rehab Potential  Positve: Patient's motivation, patient's positive attitude. Negative: Chronicity of issue; frequent falls; medical complexity     PT Frequency  2x / week    PT Duration  8 weeks    PT Treatment/Interventions  ADLs/Self Care Home Management;Aquatic Therapy;DME Instruction;Gait training;Stair training;Functional mobility training;Therapeutic activities;Therapeutic exercise;Balance training;Neuromuscular re-education;Patient/family education;Manual techniques;Passive  range of motion;Energy conservation    PT Next Visit Plan  Gait training  next session. Continue to perform functional strengthening for lower extremities such as sit to stand and continue step ups 6in; potentially trial ambulation with rolling walker; continue balance training; gait training    PT Home Exercise Plan  Seated heelraises 15 x 1x/day; LAQ x 15 each leg 1x/day; Hip Abduction/adduction x 15 1x/day; Marching alternating legs x 20 total 1x/day       Patient will benefit from skilled therapeutic intervention in order to improve the following deficits and impairments:  Abnormal gait, Decreased balance, Decreased endurance, Decreased mobility, Difficulty walking, Hypomobility, Decreased range of motion, Improper body mechanics, Impaired tone, Decreased activity tolerance, Decreased coordination, Decreased strength, Impaired flexibility, Postural dysfunction, Pain  Visit Diagnosis: Weakness of left leg  Muscle weakness (generalized)  Decreased range of motion  Balance problem  Other abnormalities of gait and mobility     Problem List Patient Active Problem List   Diagnosis Date Noted  . Acute respiratory failure with hypoxia ([place])   . Influenza with pneumonia   . Palliative care encounter   . Sepsis ([place]) 09/25/2017  . Fever 09/25/2017  . UTI (urinary tract infection) 09/25/2017  . Abnormal liver function 09/25/2017  . Tachycardia 09/25/2017  . Influenza A   . Pressure ulcer 09/14/2015  . Zenker's diverticulum 09/09/2015  . Bladder cancer ([place]) 07/14/2015  . CAD (coronary artery disease) 07/24/2014  . S/P coronary artery stent placement (RCA bare metal stent on 07/08/14) 07/24/2014  . Exertional angina ([place]) 07/08/2014  . Encounter for therapeutic drug monitoring 09/12/2013  . Atrial fibrillation ([place]) 09/08/2013  . HTN (hypertension) 09/08/2013  . Fatigue 09/08/2013  . Anemia 06/26/2012   Clarene Critchley PT, DPT 3:44 PM, 11/06/17 [facility] [address] [place], [place],  [address] Phone: [phone]   Fax:  [phone]  Name: Manuel Le MRN: [id number] Date of Birth: [birth year]

## 2017-11-06 NOTE — Telephone Encounter (Signed)
Checked pt's scheduled and confrimed we do not have him scheduled on any Saturdays. NF 11/06/17

## 2017-11-08 ENCOUNTER — Encounter (HOSPITAL_COMMUNITY): Payer: Self-pay | Admitting: Physical Therapy

## 2017-11-08 ENCOUNTER — Ambulatory Visit (HOSPITAL_COMMUNITY): Payer: Medicare Other | Admitting: Physical Therapy

## 2017-11-08 DIAGNOSIS — M2560 Stiffness of unspecified joint, not elsewhere classified: Secondary | ICD-10-CM

## 2017-11-08 DIAGNOSIS — R2689 Other abnormalities of gait and mobility: Secondary | ICD-10-CM

## 2017-11-08 DIAGNOSIS — M6281 Muscle weakness (generalized): Secondary | ICD-10-CM

## 2017-11-08 DIAGNOSIS — R29898 Other symptoms and signs involving the musculoskeletal system: Secondary | ICD-10-CM | POA: Diagnosis not present

## 2017-11-08 NOTE — Therapy (Signed)
[place], [place], [address] Phone: [phone]   Fax:  [phone]  Physical Therapy Treatment  Patient Details  Name: Manuel Le MRN: [id number] Date of Birth: November 05, 1926 Referring Provider: Dr. Delaine Lame   Encounter Date: 11/08/2017  PT End of Session - 11/08/17 1528    Visit Number  7    Number of Visits  17    Date for PT Re-Evaluation  11/16/17    Authorization Type  Medicare     Authorization Time Period  09/18/17 - 11/16/17    PT Start Time  1347    PT Stop Time  1430    PT Time Calculation (min)  43 min    Equipment Utilized During Treatment  Gait belt    Activity Tolerance  No increased pain;Patient limited by fatigue;Patient tolerated treatment well    Behavior During Therapy  [facility] for tasks assessed/performed       Past Medical History:  Diagnosis Date  . Arthritis    OSTEO  . Atrial fibrillation and flutter ([place])   . Bladder cancer ([place])   . Coronary artery disease   . Dysrhythmia   . Exertional angina ([place]) 07/08/2014  . H/O urinary frequency   . Hematuria   . Hypertension   . Iron deficiency anemia   . Kidney stones    hx of   . Mild obstructive sleep apnea    no cpap  . S/P coronary artery stent placement 07/24/2014  . Weakness     Past Surgical History:  Procedure Laterality Date  . CARDIAC CATHETERIZATION    . CARDIOVASCULAR STRESS TEST  12-15-2013  DR Kate Sable ([facility])   MILD - MODERATE PERI-INFARCT ISCHEMIA  OF INFERIOR WALL, MID-INFEROSEPTAL WALL, MID-INFEROLATERAL WALL, & BASAL INFERIOR WALL/   NORMAL LVF /  EF 59%/  INTERMEDIATE RISK STUDY  . COLONOSCOPY  07/04/2012   Procedure: COLONOSCOPY;  Surgeon: Rogene Houston, MD;  [facility];  Service: Endoscopy;  Laterality: N/A;  320  . CORONARY STENT PLACEMENT  07/08/2014   PTCA of RCA   DR COOPER  . CYSTOSCOPY W/ RETROGRADES Bilateral 12/31/2013   Procedure: CYSTOSCOPY WITH RETROGRADE  PYELOGRAM;  Surgeon: Alexis Frock, MD;  Location: [facility];  Service: Urology;  Laterality: Bilateral;  . CYSTOSCOPY W/ RETROGRADES Bilateral 01/20/2015   Procedure: CYSTOSCOPY WITH RETROGRADE PYELOGRAM;  Surgeon: Alexis Frock, MD;  Location: [facility];  Service: Urology;  Laterality: Bilateral;  . ESOPHAGOGASTRODUODENOSCOPY  07/04/2012   Procedure: ESOPHAGOGASTRODUODENOSCOPY (EGD);  Surgeon: Rogene Houston, MD;  [facility];  Service: Endoscopy;  Laterality: N/A;  . EYE SURGERY     past cataract surgery bilat  . HERNIA REPAIR Right   . LEFT HEART CATHETERIZATION WITH CORONARY ANGIOGRAM N/A 07/08/2014   Procedure: LEFT HEART CATHETERIZATION WITH CORONARY ANGIOGRAM;  Surgeon: Blane Ohara, MD;  Location: [facility] CATH LAB;  Service: Cardiovascular;  Laterality: N/A;  . PERCUTANEOUS CORONARY STENT INTERVENTION (PCI-S)  07/08/2014   Procedure: PERCUTANEOUS CORONARY STENT INTERVENTION (PCI-S);  Surgeon: Blane Ohara, MD;  Location: [facility] CATH LAB;  Service: Cardiovascular;;  . TOTAL KNEE ARTHROPLASTY Bilateral 2000  &  2003?  . TRANSTHORACIC ECHOCARDIOGRAM  09-11-2013   MODERATE LVH/  EF 55-60%/  MILD MR & TR/  MILD TO MODERATE CALCIFIED AV WITHOUT STENOSIS/  MILD LAE/   MODERATE PR  . TRANSURETHRAL RESECTION OF BLADDER TUMOR N/A 01/20/2015   Procedure: TRANSURETHRAL RESECTION OF BLADDER  TUMOR (TURBT);  Surgeon: Alexis Frock, MD;  [facility];  Service: Urology;  Laterality: N/A;  . TRANSURETHRAL RESECTION OF BLADDER TUMOR WITH GYRUS (TURBT-GYRUS) N/A 12/31/2013   Procedure: TRANSURETHRAL RESECTION OF BLADDER TUMOR WITH GYRUS (TURBT-GYRUS);  Surgeon: Alexis Frock, MD;  Location: [facility];  Service: Urology;  Laterality: N/A;  . ZENKER'S DIVERTICULECTOMY N/A 09/09/2015   Procedure: ENDOSCOPIC ZENKER'S DIVERTICULECTOMY;  Surgeon: Izora Gala, MD;  Location: [facility];  Service: ENT;  Laterality: N/A;  . ZENKER'S DIVERTICULECTOMY ENDOSCOPIC  09/09/2015    There were no vitals filed for this  visit.  Subjective Assessment - 11/08/17 1352    Subjective  Patient reported that he has been doing his exercises at home and he denied any pain, dizziness, or shortness of breath.     Pertinent History  Bladder cancer; Dysphagia; cardiac arrhythmia; hypertension; Atrial fibrillation; bilateral knee replacements    Patient Stated Goals  To improve strength in legs and balance    Currently in Pain?  No/denies                      [facility] Adult PT Treatment/Exercise - 11/08/17 0001      Knee/Hip Exercises: Stretches   Passive Hamstring Stretch  Right;Left;3 reps;30 seconds;Other (comment) Seated with leg on 8 inch step      Knee/Hip Exercises: Seated   Sit to Sand  2 sets;without UE support;10 reps 2x10 from chair without upper extremity support          Balance Exercises - 11/08/17 1353      Balance Exercises: Standing   Tandem Stance  Eyes open;Foam/compliant surface;3 reps;30 secs;Other (comment) Each lower extremity with intermittent upper extremity use    SLS  Eyes open;Solid surface;Upper extremity support 1;5 reps;Other (comment) Each lower extremity    SLS with Vectors  Solid surface;Upper extremity assist 2;3 reps;Limitations;Other (comment) Stepping toward 3 vectors x 5 each lower extremity    Step Ups  6 inch;UE support 1 1x10 each lower extremity    Tandem Gait  Forward;Intermittent upper extremity support Out of parallel bars with min A and hand hold assist 14 feet    Retro Gait  Other (comment) Out of parallel bars with min A of therapist 2 x 14 feet    Step Over Hurdles / Cones  Outside of parallel bars stepping over 4 6 inch hurdles with minimal assist and handhold assist of therapist ambulating 14 feet x 3        PT Education - 11/08/17 1352    Education provided  Yes    Education Details  Patient was educated on purpose and technique of exercises throughout session.     Person(s) Educated  Patient    Methods  Explanation;Demonstration;Verbal  cues;Tactile cues    Comprehension  Verbalized understanding;Returned demonstration;Verbal cues required;Tactile cues required;Need further instruction       PT Short Term Goals - 10/23/17 1449      PT SHORT TERM GOAL #1   Title  Patient will demonstrate understanding a report regular compliance with home exercise program.     Time  4    Period  Weeks    Status  On-going      PT SHORT TERM GOAL #2   Title  Patient will demonstrate improved MMT score by 1/2 grade in all deficient planes of lower extremities in order to assist with functional mobility.     Baseline  10/23/17: Patient demonstrated weakness in bilateral lower extremities still  Time  4    Period  Weeks    Status  On-going      PT SHORT TERM GOAL #3   Title  Patient will report ability to stand for 10 minutes in order to assist with improved performance of functional actitivities at home.     Baseline  10/23/17: Patient reports that he feels he cannot stand for more than 5 minutes currently.     Time  4    Period  Weeks    Status  On-going      PT SHORT TERM GOAL #4   Title  Patient will report pain in legs no greater than 4/10 over the course of a 1 week period indicating improved activity tolerance and improved quality of life.     Baseline  10/23/17: Patient reported no pain currently, but a 6/10 pain in his legs over the last week.     Time  4    Period  Weeks    Status  On-going        PT Long Term Goals - 10/23/17 1450      PT LONG TERM GOAL #1   Title  Patient will demonstrate improved MMT score by 1 grade in all deficient planes of lower extremities in order to assist with functional mobility.     Baseline  10/23/17: Patient has demonstrated similar strength deficits in bilateral lower extremities.     Time  8    Period  Weeks    Status  On-going      PT LONG TERM GOAL #2   Title  Patient's FOTO score will improve by 15% indicating patient's improved perceived functional ability and better tolerance of  functional activities.     Baseline  10/23/17: Patient's FOTO score is now at 12% or 88% limited.     Time  8    Period  Weeks    Status  On-going      PT LONG TERM GOAL #3   Title  Patient will demonstrate ability to perform TUG in 14 seconds or less indicating a decreased risk of falls.     Baseline  10/23/17: Patient performed TUG in 29.46 seconds this session while using a quad cane and upper extremity assistance to stand up from chair.     Time  8    Period  Weeks    Status  On-going            Plan - 11/08/17 1528    Clinical Impression Statement  Today's session continued to progress patient with balance activities and functional lower extremity strengthening. This session patient performed 2 sets of 10 of sit to stands from a chair without upper extremities. However, patient occasional required more than 1 attempt to stand. This session also progressed patient to performing tandem gait and retro gait outside of parallel bars. Patient's oxygen was monitored throughout session and found to be 91-97% throughout. Patient was provided with therapeutic rest breaks as needed throughout session. Patient would benefit from continued skilled physical therapy in order to continue progressing patient with balance, strengthening and overall functional mobility.     Rehab Potential  Fair    Clinical Impairments Affecting Rehab Potential  Positve: Patient's motivation, patient's positive attitude. Negative: Chronicity of issue; frequent falls; medical complexity     PT Frequency  2x / week    PT Duration  8 weeks    PT Treatment/Interventions  ADLs/Self Care Home Management;Aquatic Therapy;DME Instruction;Gait training;Stair training;Functional mobility training;Therapeutic activities;Therapeutic  exercise;Balance training;Neuromuscular re-education;Patient/family education;Manual techniques;Passive range of motion;Energy conservation    PT Next Visit Plan  Re-assessment. Gait training. Continue to  perform functional strengthening for lower extremities such as sit to stand and continue step ups 6in; potentially trial ambulation with rolling walker; continue balance training    PT Home Exercise Plan  Seated heelraises 15 x 1x/day; LAQ x 15 each leg 1x/day; Hip Abduction/adduction x 15 1x/day; Marching alternating legs x 20 total 1x/day    Consulted and Agree with Plan of Care  Patient       Patient will benefit from skilled therapeutic intervention in order to improve the following deficits and impairments:  Abnormal gait, Decreased balance, Decreased endurance, Decreased mobility, Difficulty walking, Hypomobility, Decreased range of motion, Improper body mechanics, Impaired tone, Decreased activity tolerance, Decreased coordination, Decreased strength, Impaired flexibility, Postural dysfunction, Pain  Visit Diagnosis: Weakness of left leg  Muscle weakness (generalized)  Decreased range of motion  Balance problem  Other abnormalities of gait and mobility     Problem List Patient Active Problem List   Diagnosis Date Noted  . Acute respiratory failure with hypoxia ([place])   . Influenza with pneumonia   . Palliative care encounter   . Sepsis ([place]) 09/25/2017  . Fever 09/25/2017  . UTI (urinary tract infection) 09/25/2017  . Abnormal liver function 09/25/2017  . Tachycardia 09/25/2017  . Influenza A   . Pressure ulcer 09/14/2015  . Zenker's diverticulum 09/09/2015  . Bladder cancer ([place]) 07/14/2015  . CAD (coronary artery disease) 07/24/2014  . S/P coronary artery stent placement (RCA bare metal stent on 07/08/14) 07/24/2014  . Exertional angina ([place]) 07/08/2014  . Encounter for therapeutic drug monitoring 09/12/2013  . Atrial fibrillation ([place]) 09/08/2013  . HTN (hypertension) 09/08/2013  . Fatigue 09/08/2013  . Anemia 06/26/2012   Clarene Critchley PT, DPT 3:33 PM, 11/08/17 [place],  [place], [address] Phone: [phone]   Fax:  [phone]  Name: ORVELL CAREAGA MRN: [id number] Date of Birth: [birth year]

## 2017-11-15 ENCOUNTER — Encounter (HOSPITAL_COMMUNITY): Payer: Self-pay

## 2017-11-15 ENCOUNTER — Ambulatory Visit (HOSPITAL_COMMUNITY): Payer: Medicare Other

## 2017-11-15 ENCOUNTER — Other Ambulatory Visit: Payer: Self-pay

## 2017-11-15 DIAGNOSIS — M6281 Muscle weakness (generalized): Secondary | ICD-10-CM

## 2017-11-15 DIAGNOSIS — R2689 Other abnormalities of gait and mobility: Secondary | ICD-10-CM

## 2017-11-15 DIAGNOSIS — R29898 Other symptoms and signs involving the musculoskeletal system: Secondary | ICD-10-CM

## 2017-11-15 DIAGNOSIS — M2560 Stiffness of unspecified joint, not elsewhere classified: Secondary | ICD-10-CM | POA: Diagnosis not present

## 2017-11-15 NOTE — Therapy (Signed)
[place], [place], [address] Phone: [phone]   Fax:  [phone]  Physical Therapy Treatment/Re-Assessment   Patient Details  Name: Manuel Le MRN: [id number] Date of Birth: 22-Dec-1926 Referring Provider: Dr. Delaine Lame   Encounter Date: 11/15/2017  PT End of Session - 11/15/17 1356    Visit Number  8    Number of Visits  17    Date for PT Re-Evaluation  11/16/17    Authorization Type  Medicare     Authorization Time Period  09/18/17 - 8/58/85 (new cert: 0/27/74-09/17/76)    PT Start Time  1353    PT Stop Time  1432    PT Time Calculation (min)  39 min    Equipment Utilized During Treatment  Gait belt    Activity Tolerance  No increased pain;Patient tolerated treatment well    Behavior During Therapy  [facility] for tasks assessed/performed       Past Medical History:  Diagnosis Date  . Arthritis    OSTEO  . Atrial fibrillation and flutter ([place])   . Bladder cancer ([place])   . Coronary artery disease   . Dysrhythmia   . Exertional angina ([place]) 07/08/2014  . H/O urinary frequency   . Hematuria   . Hypertension   . Iron deficiency anemia   . Kidney stones    hx of   . Mild obstructive sleep apnea    no cpap  . S/P coronary artery stent placement 07/24/2014  . Weakness     Past Surgical History:  Procedure Laterality Date  . CARDIAC CATHETERIZATION    . CARDIOVASCULAR STRESS TEST  12-15-2013  DR Kate Sable ([place])   MILD - MODERATE PERI-INFARCT ISCHEMIA  OF INFERIOR WALL, MID-INFEROSEPTAL WALL, MID-INFEROLATERAL WALL, & BASAL INFERIOR WALL/   NORMAL LVF /  EF 59%/  INTERMEDIATE RISK STUDY  . COLONOSCOPY  07/04/2012   Procedure: COLONOSCOPY;  Surgeon: Rogene Houston, MD;  [facility];  Service: Endoscopy;  Laterality: N/A;  320  . CORONARY STENT PLACEMENT  07/08/2014   PTCA of RCA   DR COOPER  . CYSTOSCOPY W/ RETROGRADES Bilateral 12/31/2013   Procedure: CYSTOSCOPY  WITH RETROGRADE PYELOGRAM;  Surgeon: Alexis Frock, MD;  Location: [facility];  Service: Urology;  Laterality: Bilateral;  . CYSTOSCOPY W/ RETROGRADES Bilateral 01/20/2015   Procedure: CYSTOSCOPY WITH RETROGRADE PYELOGRAM;  Surgeon: Alexis Frock, MD;  Location: [facility];  Service: Urology;  Laterality: Bilateral;  . ESOPHAGOGASTRODUODENOSCOPY  07/04/2012   Procedure: ESOPHAGOGASTRODUODENOSCOPY (EGD);  Surgeon: Rogene Houston, MD;  [facility];  Service: Endoscopy;  Laterality: N/A;  . EYE SURGERY     past cataract surgery bilat  . HERNIA REPAIR Right   . LEFT HEART CATHETERIZATION WITH CORONARY ANGIOGRAM N/A 07/08/2014   Procedure: LEFT HEART CATHETERIZATION WITH CORONARY ANGIOGRAM;  Surgeon: Blane Ohara, MD;  Location: [facility] CATH LAB;  Service: Cardiovascular;  Laterality: N/A;  . PERCUTANEOUS CORONARY STENT INTERVENTION (PCI-S)  07/08/2014   Procedure: PERCUTANEOUS CORONARY STENT INTERVENTION (PCI-S);  Surgeon: Blane Ohara, MD;  Location: [facility] CATH LAB;  Service: Cardiovascular;;  . TOTAL KNEE ARTHROPLASTY Bilateral 2000  &  2003?  . TRANSTHORACIC ECHOCARDIOGRAM  09-11-2013   MODERATE LVH/  EF 55-60%/  MILD MR & TR/  MILD TO MODERATE CALCIFIED AV WITHOUT STENOSIS/  MILD LAE/   MODERATE PR  . TRANSURETHRAL RESECTION OF BLADDER TUMOR N/A 01/20/2015   Procedure: TRANSURETHRAL RESECTION OF  BLADDER TUMOR (TURBT);  Surgeon: Alexis Frock, MD;  [facility];  Service: Urology;  Laterality: N/A;  . TRANSURETHRAL RESECTION OF BLADDER TUMOR WITH GYRUS (TURBT-GYRUS) N/A 12/31/2013   Procedure: TRANSURETHRAL RESECTION OF BLADDER TUMOR WITH GYRUS (TURBT-GYRUS);  Surgeon: Alexis Frock, MD;  Location: [facility];  Service: Urology;  Laterality: N/A;  . ZENKER'S DIVERTICULECTOMY N/A 09/09/2015   Procedure: ENDOSCOPIC ZENKER'S DIVERTICULECTOMY;  Surgeon: Izora [place], MD;  Location: [facility];  Service: ENT;  Laterality: N/A;  . ZENKER'S DIVERTICULECTOMY ENDOSCOPIC  09/09/2015    There were no vitals  filed for this visit.  Subjective Assessment - 11/15/17 1356    Subjective  Patient states that he is doing his exercises every day that he is not in therapy and that he thinks he has gotten a little bit better but is still really weak. He states he can't stand for more than 5 minutes still before he needs to sit and rest and that he can hardly walk. Over the last week he reports his max pain was 5/10 in his legs but denies pain upon arrival and throughout session. He reports in April on the 9th he is going back to see his MD about his bladder cancer.     Pertinent History  Bladder cancer; Dysphagia; cardiac arrhythmia; hypertension; Atrial fibrillation; bilateral knee replacements    Limitations  Standing;Walking;House hold activities    How long can you sit comfortably?  Not limited    How long can you stand comfortably?  5 minutes; legs give out legs hurt     How long can you walk comfortably?  1 minute or less and legs feel like they will give out    Diagnostic tests  CT of head on 03/06/17: Spondylosis greatest on left C5-C6; negative for acute intracranial findings    Patient Stated Goals  To improve strength in legs and balance    Currently in Pain?  No/denies         [facility] PT Assessment - 11/15/17 0001      Assessment   Medical Diagnosis  Weakness in Left Leg, strength and balance training    Referring Provider  Dr. Delaine Lame      Cognition   Overall Cognitive Status  Within Functional Limits for tasks assessed      Strength   Right Hip Flexion  3+/5 was 3/5    Right Hip Extension  2+/5 was2+/5    Right Hip ABduction  4/5 was 4/5    Left Hip Flexion  3+/5 was3-/5    Left Hip Extension  2+/5 was 2+/5    Left Hip ABduction  4/5 was4/5    Right Knee Flexion  4+/5 was 4/5    Right Knee Extension  4+/5 was 4+/5    Left Knee Flexion  4+/5 was 3+/5    Left Knee Extension  4+/5 was 3+/5    Right Ankle Dorsiflexion  4+/5 was 4+/5    Right Ankle Plantar Flexion  3+/5 was 3+/5     Left Ankle Dorsiflexion  4+/5 was 4+/5    Left Ankle Plantar Flexion  3+/5 was 3+/5      Timed Up and Go Test   TUG  Normal TUG    Normal TUG (seconds)  20.31 was 29.46    TUG Comments  Patient performed with quad cane and with use of upper extremities to stand       No data recorded    [facility] Adult PT Treatment/Exercise - 11/15/17  0001      Knee/Hip Exercises: Seated   Long Arc Quad  AROM;Strengthening;Right;Left;1 set;10 reps;Other (comment)    Sit to Sand  2 sets;without UE support;10 reps;with UE support with Rt UE 1st set; without UE 2nd set       Balance Exercises - 11/15/17 1411      Balance Exercises: Standing   Tandem Stance  Eyes open;Foam/compliant surface;3 reps;30 secs;Other (comment)    Standing, One Foot on a Step  Eyes open;2 inch;4 reps;20 secs alternating feet    Tandem Gait  Forward;Intermittent upper extremity support;3 reps        PT Education - 11/15/17 1725    Education provided  Yes    Education Details  edcuated on progression towards goals and improvement in TUG. Edcuated on exercise form throughout and good posture.    Person(s) Educated  Patient    Methods  Explanation;Tactile cues;Verbal cues;Demonstration    Comprehension  Returned demonstration;Verbalized understanding;Need further instruction       PT Short Term Goals - 11/15/17 1357      PT SHORT TERM GOAL #1   Title  Patient will demonstrate understanding a report regular compliance with home exercise program.     Baseline  every day that he is not in therapy    Time  4    Period  Weeks    Status  Achieved      PT SHORT TERM GOAL #2   Title  Patient will demonstrate improved MMT score by 1/2 grade in all deficient planes of lower extremities in order to assist with functional mobility.     Baseline  11/15/17 - some groups improved by 1/2 grade     Time  4    Period  Weeks    Status  Partially Met      PT SHORT TERM GOAL #3   Title  Patient will report ability to stand for 10  minutes in order to assist with improved performance of functional actitivities at home.     Baseline  11/15/17: Patient reports that he feels he cannot stand for more than 5 minutes currently.     Time  4    Period  Weeks    Status  On-going      PT SHORT TERM GOAL #4   Title  Patient will report pain in legs no greater than 4/10 over the course of a 1 week period indicating improved activity tolerance and improved quality of life.     Baseline  11/15/17: Patient reported no pain currently, but a 5/10 pain in his legs over the last week.     Time  4    Period  Weeks    Status  On-going        PT Long Term Goals - 11/15/17 1714      PT LONG TERM GOAL #1   Title  Patient will demonstrate improved MMT score by 1 grade in all deficient planes of lower extremities in order to assist with functional mobility.     Baseline  11/15/17 - some improved by 1/2 grade    Time  8    Period  Weeks    Status  On-going      PT LONG TERM GOAL #2   Title  Patient's FOTO score will improve by 15% indicating patient's improved perceived functional ability and better tolerance of functional activities.     Baseline  10/23/17: Patient's FOTO score is now at 12% or 88% limited.  Time  8    Period  Weeks    Status  On-going      PT LONG TERM GOAL #3   Title  Patient will demonstrate ability to perform TUG in 14 seconds or less indicating a decreased risk of falls.     Baseline  11/15/17 - 20.31 seconds (was 29.46 seconds)    Time  8    Period  Weeks    Status  On-going         Plan - 11/15/17 1712    Clinical Impression Statement  Re-assessment performed today and patient has met/partially met 2 short term goals and is progressing towards long term goals. He has made some improvement in Bil LE strength however hip muscles remain weak. He also made a significant improvement in TUG time from 29 seconds to 20 seconds with quad cane demonstrating improve balance with gait/mobility. He continues to  ambulate with a crouched posture due to bil knee ROM deficits in Le weakness. He will continue to benefit from skilled PT services to address ongoing impairments and progress towards goals and improve functional mobility for greater QOL.    Rehab Potential  Fair    Clinical Impairments Affecting Rehab Potential  Positve: Patient's motivation, patient's positive attitude. Negative: Chronicity of issue; frequent falls; medical complexity     PT Frequency  2x / week    PT Duration  4 weeks    PT Treatment/Interventions  ADLs/Self Care Home Management;Aquatic Therapy;DME Instruction;Gait training;Stair training;Functional mobility training;Therapeutic activities;Therapeutic exercise;Balance training;Neuromuscular re-education;Patient/family education;Manual techniques;Passive range of motion;Energy conservation    PT Next Visit Plan  Gait training. Continue to perform functional strengthening for lower extremities such as sit to stand and continue step ups 6in; potentially trial ambulation with rolling walker; continue balance training    PT Home Exercise Plan  Seated heelraises 15 x 1x/day; LAQ x 15 each leg 1x/day; Hip Abduction/adduction x 15 1x/day; Marching alternating legs x 20 total 1x/day    Consulted and Agree with Plan of Care  Patient       Patient will benefit from skilled therapeutic intervention in order to improve the following deficits and impairments:  Abnormal gait, Decreased balance, Decreased endurance, Decreased mobility, Difficulty walking, Hypomobility, Decreased range of motion, Improper body mechanics, Impaired tone, Decreased activity tolerance, Decreased coordination, Decreased strength, Impaired flexibility, Postural dysfunction, Pain  Visit Diagnosis: Weakness of left leg  Muscle weakness (generalized)  Decreased range of motion  Balance problem  Other abnormalities of gait and mobility     Problem List Patient Active Problem List   Diagnosis Date Noted  .  Acute respiratory failure with hypoxia ([place])   . Influenza with pneumonia   . Palliative care encounter   . Sepsis ([place]) 09/25/2017  . Fever 09/25/2017  . UTI (urinary tract infection) 09/25/2017  . Abnormal liver function 09/25/2017  . Tachycardia 09/25/2017  . Influenza A   . Pressure ulcer 09/14/2015  . Zenker's diverticulum 09/09/2015  . Bladder cancer ([place]) 07/14/2015  . CAD (coronary artery disease) 07/24/2014  . S/P coronary artery stent placement (RCA bare metal stent on 07/08/14) 07/24/2014  . Exertional angina ([place]) 07/08/2014  . Encounter for therapeutic drug monitoring 09/12/2013  . Atrial fibrillation ([place]) 09/08/2013  . HTN (hypertension) 09/08/2013  . Fatigue 09/08/2013  . Anemia 06/26/2012    Kipp Brood, PT, DPT Physical Therapist with [facility]  11/15/2017 5:33 PM    [address] [place]  [place], [place], [address] Phone: [phone]   Fax:  [phone]  Name: DWON SKY MRN: [id number] Date of Birth: [birth year]

## 2017-11-20 ENCOUNTER — Ambulatory Visit (HOSPITAL_COMMUNITY): Payer: Medicare Other | Attending: Internal Medicine | Admitting: Physical Therapy

## 2017-11-20 ENCOUNTER — Encounter (HOSPITAL_COMMUNITY): Payer: Self-pay | Admitting: Physical Therapy

## 2017-11-20 DIAGNOSIS — M6281 Muscle weakness (generalized): Secondary | ICD-10-CM | POA: Diagnosis not present

## 2017-11-20 DIAGNOSIS — R29898 Other symptoms and signs involving the musculoskeletal system: Secondary | ICD-10-CM | POA: Diagnosis not present

## 2017-11-20 DIAGNOSIS — M2560 Stiffness of unspecified joint, not elsewhere classified: Secondary | ICD-10-CM | POA: Insufficient documentation

## 2017-11-20 DIAGNOSIS — R2689 Other abnormalities of gait and mobility: Secondary | ICD-10-CM | POA: Insufficient documentation

## 2017-11-20 NOTE — Therapy (Addendum)
[place], [place], [address] Phone: [phone]   Fax:  [phone]  Physical Therapy Treatment  Patient Details  Name: Manuel Le MRN: [id number] Date of Birth: 06-08-1927 Referring Provider: Dr. Delaine Lame   Encounter Date: 11/20/2017  PT End of Session - 11/20/17 1506    Visit Number  9    Number of Visits  25    Date for PT Re-Evaluation  12/13/17    Authorization Type  Medicare     Authorization Time Period  09/18/17 - 3/41/93 (new cert: 7/90/24-0/97/35)    PT Start Time  1431    PT Stop Time  1514    PT Time Calculation (min)  43 min    Equipment Utilized During Treatment  Gait belt    Activity Tolerance  No increased pain;Patient tolerated treatment well    Behavior During Therapy  [facility] for tasks assessed/performed       Past Medical History:  Diagnosis Date  . Arthritis    OSTEO  . Atrial fibrillation and flutter ([place])   . Bladder cancer ([place])   . Coronary artery disease   . Dysrhythmia   . Exertional angina ([place]) 07/08/2014  . H/O urinary frequency   . Hematuria   . Hypertension   . Iron deficiency anemia   . Kidney stones    hx of   . Mild obstructive sleep apnea    no cpap  . S/P coronary artery stent placement 07/24/2014  . Weakness     Past Surgical History:  Procedure Laterality Date  . CARDIAC CATHETERIZATION    . CARDIOVASCULAR STRESS TEST  12-15-2013  DR Kate Sable ([place])   MILD - MODERATE PERI-INFARCT ISCHEMIA  OF INFERIOR WALL, MID-INFEROSEPTAL WALL, MID-INFEROLATERAL WALL, & BASAL INFERIOR WALL/   NORMAL LVF /  EF 59%/  INTERMEDIATE RISK STUDY  . COLONOSCOPY  07/04/2012   Procedure: COLONOSCOPY;  Surgeon: Rogene Houston, MD;  [facility];  Service: Endoscopy;  Laterality: N/A;  320  . CORONARY STENT PLACEMENT  07/08/2014   PTCA of RCA   DR COOPER  . CYSTOSCOPY W/ RETROGRADES Bilateral 12/31/2013   Procedure: CYSTOSCOPY WITH RETROGRADE  PYELOGRAM;  Surgeon: Alexis Frock, MD;  Location: [facility];  Service: Urology;  Laterality: Bilateral;  . CYSTOSCOPY W/ RETROGRADES Bilateral 01/20/2015   Procedure: CYSTOSCOPY WITH RETROGRADE PYELOGRAM;  Surgeon: Alexis Frock, MD;  Location: [facility];  Service: Urology;  Laterality: Bilateral;  . ESOPHAGOGASTRODUODENOSCOPY  07/04/2012   Procedure: ESOPHAGOGASTRODUODENOSCOPY (EGD);  Surgeon: Rogene Houston, MD;  [facility];  Service: Endoscopy;  Laterality: N/A;  . EYE SURGERY     past cataract surgery bilat  . HERNIA REPAIR Right   . LEFT HEART CATHETERIZATION WITH CORONARY ANGIOGRAM N/A 07/08/2014   Procedure: LEFT HEART CATHETERIZATION WITH CORONARY ANGIOGRAM;  Surgeon: Blane Ohara, MD;  Location: [facility] CATH LAB;  Service: Cardiovascular;  Laterality: N/A;  . PERCUTANEOUS CORONARY STENT INTERVENTION (PCI-S)  07/08/2014   Procedure: PERCUTANEOUS CORONARY STENT INTERVENTION (PCI-S);  Surgeon: Blane Ohara, MD;  Location: [facility] CATH LAB;  Service: Cardiovascular;;  . TOTAL KNEE ARTHROPLASTY Bilateral 2000  &  2003?  . TRANSTHORACIC ECHOCARDIOGRAM  09-11-2013   MODERATE LVH/  EF 55-60%/  MILD MR & TR/  MILD TO MODERATE CALCIFIED AV WITHOUT STENOSIS/  MILD LAE/   MODERATE PR  . TRANSURETHRAL RESECTION OF BLADDER TUMOR N/A 01/20/2015   Procedure: TRANSURETHRAL RESECTION OF BLADDER  TUMOR (TURBT);  Surgeon: Alexis Frock, MD;  Location: [facility];  Service: Urology;  Laterality: N/A;  . TRANSURETHRAL RESECTION OF BLADDER TUMOR WITH GYRUS (TURBT-GYRUS) N/A 12/31/2013   Procedure: TRANSURETHRAL RESECTION OF BLADDER TUMOR WITH GYRUS (TURBT-GYRUS);  Surgeon: Alexis Frock, MD;  Location: [facility];  Service: Urology;  Laterality: N/A;  . ZENKER'S DIVERTICULECTOMY N/A 09/09/2015   Procedure: ENDOSCOPIC ZENKER'S DIVERTICULECTOMY;  Surgeon: Izora Gala, MD;  Location: [facility];  Service: ENT;  Laterality: N/A;  . ZENKER'S DIVERTICULECTOMY ENDOSCOPIC  09/09/2015    There were no vitals filed for this  visit.  Subjective Assessment - 11/20/17 1447    Subjective  Patient stated he was doing well. He said he has forgotten to do his exercises the past couple days.     Pertinent History  Bladder cancer; Dysphagia; cardiac arrhythmia; hypertension; Atrial fibrillation; bilateral knee replacements    Limitations  Standing;Walking;House hold activities    How long can you sit comfortably?  Not limited    How long can you stand comfortably?  5 minutes; legs give out legs hurt     How long can you walk comfortably?  1 minute or less and legs feel like they will give out    Diagnostic tests  CT of head on 03/06/17: Spondylosis greatest on left C5-C6; negative for acute intracranial findings    Patient Stated Goals  To improve strength in legs and balance    Currently in Pain?  No/denies    Multiple Pain Sites  No                       [facility] Adult PT Treatment/Exercise - 11/20/17 0001      Ambulation/Gait   Ambulation/Gait  Yes    Ambulation/Gait Assistance  4: Min guard    Ambulation Distance (Feet)  -- 82 feet, 168 feet, 113 feet. With rest breaks in between    Assistive device  Small based quad cane    Gait Pattern  Decreased step length - right;Decreased step length - left;Decreased stance time - right;Decreased stance time - left;Decreased stride length;Trunk flexed;Poor foot clearance - left;Poor foot clearance - right    Ambulation Surface  Level    Gait velocity  Below age-related norm    Gait Comments  Therapist provided verbal cues for patient to stand up tall and provided cues throughout ambulation for posture and to place all 4 points of quad cane onto the floor for support      Knee/Hip Exercises: Standing   Heel Raises  Both;10 reps;Other (comment) Partial range of motion      Knee/Hip Exercises: Seated   Sit to Sand  2 sets;without UE support;10 reps          Balance Exercises - 11/20/17 1454      Balance Exercises: Standing   Tandem Stance  Eyes  open;Foam/compliant surface;3 reps;30 secs;Other (comment) Each lower extremity forward intermittent UE support    Step Ups  6 inch;Intermittent UE support each lower extremity x 10   Step Over Hurdles / [place]  Outside of parallel bars stepping over 4 6 inch hurdles with minimal assist and handhold assist of therapist ambulating 14 feet x 2    Marching Limitations  2 sets x 10 reps x3" holds intermittent intermittent hand hold assist          PT Short Term Goals - 11/15/17 1357      PT SHORT TERM GOAL #1   Title  Patient  will demonstrate understanding a report regular compliance with home exercise program.     Baseline  every day that he is not in therapy    Time  4    Period  Weeks    Status  Achieved      PT SHORT TERM GOAL #2   Title  Patient will demonstrate improved MMT score by 1/2 grade in all deficient planes of lower extremities in order to assist with functional mobility.     Baseline  11/15/17 - some groups improved by 1/2 grade     Time  4    Period  Weeks    Status  Partially Met      PT SHORT TERM GOAL #3   Title  Patient will report ability to stand for 10 minutes in order to assist with improved performance of functional actitivities at home.     Baseline  11/15/17: Patient reports that he feels he cannot stand for more than 5 minutes currently.     Time  4    Period  Weeks    Status  On-going      PT SHORT TERM GOAL #4   Title  Patient will report pain in legs no greater than 4/10 over the course of a 1 week period indicating improved activity tolerance and improved quality of life.     Baseline  11/15/17: Patient reported no pain currently, but a 5/10 pain in his legs over the last week.     Time  4    Period  Weeks    Status  On-going        PT Long Term Goals - 11/15/17 1714      PT LONG TERM GOAL #1   Title  Patient will demonstrate improved MMT score by 1 grade in all deficient planes of lower extremities in order to assist with functional mobility.      Baseline  11/15/17 - some improved by 1/2 grade    Time  8    Period  Weeks    Status  On-going      PT LONG TERM GOAL #2   Title  Patient's FOTO score will improve by 15% indicating patient's improved perceived functional ability and better tolerance of functional activities.     Baseline  10/23/17: Patient's FOTO score is now at 12% or 88% limited.     Time  8    Period  Weeks    Status  On-going      PT LONG TERM GOAL #3   Title  Patient will demonstrate ability to perform TUG in 14 seconds or less indicating a decreased risk of falls.     Baseline  11/15/17 - 20.31 seconds (was 29.46 seconds)    Time  8    Period  Weeks    Status  On-going            Plan - 11/20/17 1534    Clinical Impression Statement  This session began with gait training. Patient ambulated 82 feet, then 168 feet, then 113 feet with rest breaks in between. Patient was provided verbal cues throughout to improve form. Patient continued to demonstrate a crouched gait with ambulation and poor foot clearance. The remainder of the session focused on balance exercises to improve patient's balance and functional lower extremity strength. This session patient performed stepping over hurdles with fewer cues to step over and not circumduct around hurdles, however patient did become fatigued after performing 2 times rather than 3  that he performed previously. Patient's oxygen saturation was found to be at or above 97% throughout session. Patient was provided therapeutic rest breaks throughout session to due to fatigue. Patient would benefit from continued skilled physical therapy in order to progress patient's lower extremity strength, balance, endurance, and overall functional mobility.     Rehab Potential  Fair    Clinical Impairments Affecting Rehab Potential  Positve: Patient's motivation, patient's positive attitude. Negative: Chronicity of issue; frequent falls; medical complexity     PT Frequency  2x / week    PT  Duration  4 weeks    PT Treatment/Interventions  ADLs/Self Care Home Management;Aquatic Therapy;DME Instruction;Gait training;Stair training;Functional mobility training;Therapeutic activities;Therapeutic exercise;Balance training;Neuromuscular re-education;Patient/family education;Manual techniques;Passive range of motion;Energy conservation    PT Next Visit Plan  Continue to perform functional strengthening for lower extremities such as sit to stand and continue step ups 6in; progress dynamic balance next session with dual task; potentially trial ambulation with rolling walker    PT Home Exercise Plan  Seated heelraises 15 x 1x/day; LAQ x 15 each leg 1x/day; Hip Abduction/adduction x 15 1x/day; Marching alternating legs x 20 total 1x/day    Consulted and Agree with Plan of Care  Patient       Patient will benefit from skilled therapeutic intervention in order to improve the following deficits and impairments:  Abnormal gait, Decreased balance, Decreased endurance, Decreased mobility, Difficulty walking, Hypomobility, Decreased range of motion, Improper body mechanics, Impaired tone, Decreased activity tolerance, Decreased coordination, Decreased strength, Impaired flexibility, Postural dysfunction, Pain  Visit Diagnosis: Weakness of left leg  Muscle weakness (generalized)  Decreased range of motion  Balance problem  Other abnormalities of gait and mobility     Problem List Patient Active Problem List   Diagnosis Date Noted  . Acute respiratory failure with hypoxia ([place])   . Influenza with pneumonia   . Palliative care encounter   . Sepsis ([place]) 09/25/2017  . Fever 09/25/2017  . UTI (urinary tract infection) 09/25/2017  . Abnormal liver function 09/25/2017  . Tachycardia 09/25/2017  . Influenza A   . Pressure ulcer 09/14/2015  . Zenker's diverticulum 09/09/2015  . Bladder cancer ([place]) 07/14/2015  . CAD (coronary artery disease) 07/24/2014  . S/P coronary artery stent  placement (RCA bare metal stent on 07/08/14) 07/24/2014  . Exertional angina ([place]) 07/08/2014  . Encounter for therapeutic drug monitoring 09/12/2013  . Atrial fibrillation ([place]) 09/08/2013  . HTN (hypertension) 09/08/2013  . Fatigue 09/08/2013  . Anemia 06/26/2012   Clarene Critchley PT, DPT 3:38 PM, 11/20/17 [place], [place], [address] Phone: [phone]   Fax:  [phone]  Name: Manuel Le MRN: [id number] Date of Birth: 03/18/1927

## 2017-11-22 ENCOUNTER — Ambulatory Visit (HOSPITAL_COMMUNITY): Payer: Medicare Other

## 2017-11-22 ENCOUNTER — Encounter (HOSPITAL_COMMUNITY): Payer: Self-pay

## 2017-11-22 ENCOUNTER — Telehealth (HOSPITAL_COMMUNITY): Payer: Self-pay

## 2017-11-22 NOTE — Telephone Encounter (Signed)
No show, called about missed apt.  No answer.  [address], [place]; [place] [phone]

## 2017-11-27 ENCOUNTER — Ambulatory Visit (HOSPITAL_COMMUNITY): Payer: Medicare Other | Admitting: Physical Therapy

## 2017-11-27 ENCOUNTER — Encounter (HOSPITAL_COMMUNITY): Payer: Self-pay | Admitting: Physical Therapy

## 2017-11-27 DIAGNOSIS — R2689 Other abnormalities of gait and mobility: Secondary | ICD-10-CM

## 2017-11-27 DIAGNOSIS — R29898 Other symptoms and signs involving the musculoskeletal system: Secondary | ICD-10-CM | POA: Diagnosis not present

## 2017-11-27 DIAGNOSIS — M2560 Stiffness of unspecified joint, not elsewhere classified: Secondary | ICD-10-CM

## 2017-11-27 DIAGNOSIS — M6281 Muscle weakness (generalized): Secondary | ICD-10-CM | POA: Diagnosis not present

## 2017-11-27 NOTE — Patient Instructions (Signed)
  SEATED HAMSTRING STRETCH While seated, rest your heel on the floor with your knee straight and gently lean forward until a stretch is felt behind your knee/thigh. Perform each leg  Repeat 3 Times Hold 30 Seconds Perform 1 Time(s) a Day

## 2017-11-27 NOTE — Therapy (Addendum)
[place], [place], [address] Phone: [phone]   Fax:  [phone]  Physical Therapy Treatment  Patient Details  Name: Manuel Le MRN: [id number] Date of Birth: 1926/11/07 Referring Provider: Dr. Delaine Lame   Encounter Date: 11/27/2017  PT End of Session - 11/27/17 1427    Visit Number  10    Number of Visits  25    Date for PT Re-Evaluation  12/13/17    Authorization Type  Medicare     Authorization Time Period  09/18/17 - 7/35/32 (new cert: 9/92/42-6/83/41)    PT Start Time  1353    PT Stop Time  1431    PT Time Calculation (min)  38 min    Equipment Utilized During Treatment  Gait belt    Activity Tolerance  No increased pain;Patient tolerated treatment well    Behavior During Therapy  [facility] for tasks assessed/performed       Past Medical History:  Diagnosis Date  . Arthritis    OSTEO  . Atrial fibrillation and flutter ([place])   . Bladder cancer ([place])   . Coronary artery disease   . Dysrhythmia   . Exertional angina ([place]) 07/08/2014  . H/O urinary frequency   . Hematuria   . Hypertension   . Iron deficiency anemia   . Kidney stones    hx of   . Mild obstructive sleep apnea    no cpap  . S/P coronary artery stent placement 07/24/2014  . Weakness     Past Surgical History:  Procedure Laterality Date  . CARDIAC CATHETERIZATION    . CARDIOVASCULAR STRESS TEST  12-15-2013  DR Kate Sable ([place])   MILD - MODERATE PERI-INFARCT ISCHEMIA  OF INFERIOR WALL, MID-INFEROSEPTAL WALL, MID-INFEROLATERAL WALL, & BASAL INFERIOR WALL/   NORMAL LVF /  EF 59%/  INTERMEDIATE RISK STUDY  . COLONOSCOPY  07/04/2012   Procedure: COLONOSCOPY;  Surgeon: Rogene Houston, MD;  [facility];  Service: Endoscopy;  Laterality: N/A;  320  . CORONARY STENT PLACEMENT  07/08/2014   PTCA of RCA   DR COOPER  . CYSTOSCOPY W/ RETROGRADES Bilateral 12/31/2013   Procedure: CYSTOSCOPY WITH RETROGRADE  PYELOGRAM;  Surgeon: Alexis Frock, MD;  [facility];  Service: Urology;  Laterality: Bilateral;  . CYSTOSCOPY W/ RETROGRADES Bilateral 01/20/2015   Procedure: CYSTOSCOPY WITH RETROGRADE PYELOGRAM;  Surgeon: Alexis Frock, MD;  [facility];  Service: Urology;  Laterality: Bilateral;  . ESOPHAGOGASTRODUODENOSCOPY  07/04/2012   Procedure: ESOPHAGOGASTRODUODENOSCOPY (EGD);  Surgeon: Rogene Houston, MD;  [facility];  Service: Endoscopy;  Laterality: N/A;  . EYE SURGERY     past cataract surgery bilat  . HERNIA REPAIR Right   . LEFT HEART CATHETERIZATION WITH CORONARY ANGIOGRAM N/A 07/08/2014   Procedure: LEFT HEART CATHETERIZATION WITH CORONARY ANGIOGRAM;  Surgeon: Blane Ohara, MD;  Location: [facility] CATH LAB;  Service: Cardiovascular;  Laterality: N/A;  . PERCUTANEOUS CORONARY STENT INTERVENTION (PCI-S)  07/08/2014   Procedure: PERCUTANEOUS CORONARY STENT INTERVENTION (PCI-S);  Surgeon: Blane Ohara, MD;  Location: [facility] CATH LAB;  Service: Cardiovascular;;  . TOTAL KNEE ARTHROPLASTY Bilateral 2000  &  2003?  . TRANSTHORACIC ECHOCARDIOGRAM  09-11-2013   MODERATE LVH/  EF 55-60%/  MILD MR & TR/  MILD TO MODERATE CALCIFIED AV WITHOUT STENOSIS/  MILD LAE/   MODERATE PR  . TRANSURETHRAL RESECTION OF BLADDER TUMOR N/A 01/20/2015   Procedure: TRANSURETHRAL RESECTION OF BLADDER  TUMOR (TURBT);  Surgeon: Alexis Frock, MD;  [facility];  Service: Urology;  Laterality: N/A;  . TRANSURETHRAL RESECTION OF BLADDER TUMOR WITH GYRUS (TURBT-GYRUS) N/A 12/31/2013   Procedure: TRANSURETHRAL RESECTION OF BLADDER TUMOR WITH GYRUS (TURBT-GYRUS);  Surgeon: Alexis Frock, MD;  [facility];  Service: Urology;  Laterality: N/A;  . ZENKER'S DIVERTICULECTOMY N/A 09/09/2015   Procedure: ENDOSCOPIC ZENKER'S DIVERTICULECTOMY;  Surgeon: Izora Gala, MD;  Location: [place];  Service: ENT;  Laterality: N/A;  . ZENKER'S DIVERTICULECTOMY ENDOSCOPIC  09/09/2015    There were no vitals filed for this  visit.  Subjective Assessment - 11/27/17 1407    Subjective  Patient stated he is not having any pain and he has been doing his exercises at home.     Pertinent History  Bladder cancer; Dysphagia; cardiac arrhythmia; hypertension; Atrial fibrillation; bilateral knee replacements    Limitations  Standing;Walking;House hold activities    How long can you sit comfortably?  Not limited    How long can you stand comfortably?  5 minutes; legs give out legs hurt     How long can you walk comfortably?  1 minute or less and legs feel like they will give out    Diagnostic tests  CT of head on 03/06/17: Spondylosis greatest on left C5-C6; negative for acute intracranial findings    Patient Stated Goals  To improve strength in legs and balance    Currently in Pain?  No/denies                       [facility] Adult PT Treatment/Exercise - 11/27/17 0001      Ambulation/Gait   Ambulation/Gait  Yes    Ambulation/Gait Assistance  4: Min guard    Ambulation Distance (Feet)  -- 226 feet x 3 verbal cues to lift feet and stand tall    Assistive device  Small based quad cane    Ambulation Surface  Level    Gait velocity  Below age-related norm    Gait Comments  Therapist provided verbal cues for patient to stand up tall and provided cues throughout ambulation for posture and to lift feet      Knee/Hip Exercises: Stretches   Passive Hamstring Stretch  Right;Left;30 seconds;Other (comment);2 reps Seated      Knee/Hip Exercises: Seated   Sit to Sand  2 sets;without UE support;10 reps;Other (comment) From standard height chair          Balance Exercises - 11/27/17 1415      Balance Exercises: Standing   Step Ups  6 inch;Intermittent UE support;Forward Right lower extremity only due to time. X 10 repetitions     Gait with Head Turns  Forward;Other reps (comment) 14 feet x 6 with left and right head turns    Tandem Gait  Forward;Intermittent upper extremity support;3 reps 14 feet x 2     Step  Over Hurdles / Cones  Outside of parallel bars stepping over 4 6 inch hurdles with minimal assist and handhold assist of therapist ambulating 14 feet x 2        PT Education - 11/27/17 1532    Education provided  Yes    Education Details  Patient was educated on purpose and technique of exercsies and with gait training throughout session.     Person(s) Educated  Patient    Methods  Explanation;Tactile cues;Verbal cues;Demonstration    Comprehension  Returned demonstration;Verbalized understanding;Tactile cues required;Verbal cues required  PT Short Term Goals - 11/15/17 1357      PT SHORT TERM GOAL #1   Title  Patient will demonstrate understanding a report regular compliance with home exercise program.     Baseline  every day that he is not in therapy    Time  4    Period  Weeks    Status  Achieved      PT SHORT TERM GOAL #2   Title  Patient will demonstrate improved MMT score by 1/2 grade in all deficient planes of lower extremities in order to assist with functional mobility.     Baseline  11/15/17 - some groups improved by 1/2 grade     Time  4    Period  Weeks    Status  Partially Met      PT SHORT TERM GOAL #3   Title  Patient will report ability to stand for 10 minutes in order to assist with improved performance of functional actitivities at home.     Baseline  11/15/17: Patient reports that he feels he cannot stand for more than 5 minutes currently.     Time  4    Period  Weeks    Status  On-going      PT SHORT TERM GOAL #4   Title  Patient will report pain in legs no greater than 4/10 over the course of a 1 week period indicating improved activity tolerance and improved quality of life.     Baseline  11/15/17: Patient reported no pain currently, but a 5/10 pain in his legs over the last week.     Time  4    Period  Weeks    Status  On-going        PT Long Term Goals - 11/15/17 1714      PT LONG TERM GOAL #1   Title  Patient will demonstrate improved MMT  score by 1 grade in all deficient planes of lower extremities in order to assist with functional mobility.     Baseline  11/15/17 - some improved by 1/2 grade    Time  8    Period  Weeks    Status  On-going      PT LONG TERM GOAL #2   Title  Patient's FOTO score will improve by 15% indicating patient's improved perceived functional ability and better tolerance of functional activities.     Baseline  10/23/17: Patient's FOTO score is now at 12% or 88% limited.     Time  8    Period  Weeks    Status  On-going      PT LONG TERM GOAL #3   Title  Patient will demonstrate ability to perform TUG in 14 seconds or less indicating a decreased risk of falls.     Baseline  11/15/17 - 20.31 seconds (was 29.46 seconds)    Time  8    Period  Weeks    Status  On-going            Plan - 11/27/17 1419    Clinical Impression Statement  This session continued to progress patient with gait training and balance activities. Patient was able to ambulate 226 feet 3 times during this session with rest breaks in between. Therapist provided verbal cues and tactile cues to improve patient's posture and for patient to perform ambulation with a heel strike and with increased foot clearance. This session patient also performed ambulation with head turns to challenge patient with gait  when moving his head and therapist noted a decrease in patient's gait velocity during head turn trials. Therapist measured patient's SpO2 throughout session and it remained between 85-98% with therapist allowing patient to take therapeutic rest breaks to return his oxygen saturation to 94% before proceeding with exercises. Patient demonstrated improved endurance with ambulation this session. Patient would benefit from continued skilled physical therapy to improve patient's balance, gait, endurance, strength, and overall functional mobility.     Rehab Potential  Fair    Clinical Impairments Affecting Rehab Potential  Positve: Patient's  motivation, patient's positive attitude. Negative: Chronicity of issue; frequent falls; medical complexity     PT Frequency  2x / week    PT Duration  4 weeks    PT Treatment/Interventions  ADLs/Self Care Home Management;Aquatic Therapy;DME Instruction;Gait training;Stair training;Functional mobility training;Therapeutic activities;Therapeutic exercise;Balance training;Neuromuscular re-education;Patient/family education;Manual techniques;Passive range of motion;Energy conservation    PT Next Visit Plan  Continue to perform functional strengthening for lower extremities such as sit to stand and continue step ups 6in; progress dynamic balance next session with dual task such as listing items with ambulation; potentially trial ambulation with rolling walker    PT Home Exercise Plan  Seated heelraises 15 x 1x/day; LAQ x 15 each leg 1x/day; Hip Abduction/adduction x 15 1x/day; Marching alternating legs x 20 total 1x/day; 11/27/17: Seated hamstring stretch 3x30 seconds each leg    Consulted and Agree with Plan of Care  Patient       Patient will benefit from skilled therapeutic intervention in order to improve the following deficits and impairments:  Abnormal gait, Decreased balance, Decreased endurance, Decreased mobility, Difficulty walking, Hypomobility, Decreased range of motion, Improper body mechanics, Impaired tone, Decreased activity tolerance, Decreased coordination, Decreased strength, Impaired flexibility, Postural dysfunction, Pain  Visit Diagnosis: Weakness of left leg  Muscle weakness (generalized)  Decreased range of motion  Balance problem  Other abnormalities of gait and mobility     Problem List Patient Active Problem List   Diagnosis Date Noted  . Acute respiratory failure with hypoxia ([place])   . Influenza with pneumonia   . Palliative care encounter   . Sepsis ([place]) 09/25/2017  . Fever 09/25/2017  . UTI (urinary tract infection) 09/25/2017  . Abnormal liver function  09/25/2017  . Tachycardia 09/25/2017  . Influenza A   . Pressure ulcer 09/14/2015  . Zenker's diverticulum 09/09/2015  . Bladder cancer ([place]) 07/14/2015  . CAD (coronary artery disease) 07/24/2014  . S/P coronary artery stent placement (RCA bare metal stent on 07/08/14) 07/24/2014  . Exertional angina ([place]) 07/08/2014  . Encounter for therapeutic drug monitoring 09/12/2013  . Atrial fibrillation ([place]) 09/08/2013  . HTN (hypertension) 09/08/2013  . Fatigue 09/08/2013  . Anemia 06/26/2012   Manuel Le PT, DPT 3:40 PM, 11/27/17 [place], [place], [address] Phone: [phone]   Fax:  [phone]  Name: Manuel Le MRN: [id number] Date of Birth: 15-May-1927

## 2017-11-29 ENCOUNTER — Encounter (HOSPITAL_COMMUNITY): Payer: Self-pay | Admitting: Physical Therapy

## 2017-11-29 ENCOUNTER — Ambulatory Visit (HOSPITAL_COMMUNITY): Payer: Medicare Other | Admitting: Physical Therapy

## 2017-11-29 DIAGNOSIS — R29898 Other symptoms and signs involving the musculoskeletal system: Secondary | ICD-10-CM

## 2017-11-29 DIAGNOSIS — R2689 Other abnormalities of gait and mobility: Secondary | ICD-10-CM | POA: Diagnosis not present

## 2017-11-29 DIAGNOSIS — M2560 Stiffness of unspecified joint, not elsewhere classified: Secondary | ICD-10-CM

## 2017-11-29 DIAGNOSIS — M6281 Muscle weakness (generalized): Secondary | ICD-10-CM | POA: Diagnosis not present

## 2017-11-29 NOTE — Therapy (Signed)
[place], [place], [address] Phone: [phone]   Fax:  [phone]  Physical Therapy Treatment  Patient Details  Name: Manuel Le MRN: [id number] Date of Birth: [birth year] Referring Provider: Dr. Delaine Lame   Encounter Date: 11/29/2017  PT End of Session - 11/29/17 1437    Visit Number  11    Number of Visits  25    Date for PT Re-Evaluation  12/13/17    Authorization Type  Medicare     Authorization Time Period  09/18/17 - 6/96/29 (new cert: 01/16/40-11/12/38)    PT Start Time  1350    PT Stop Time  1430    PT Time Calculation (min)  40 min    Equipment Utilized During Treatment  Gait belt    Activity Tolerance  No increased pain;Patient tolerated treatment well    Behavior During Therapy  [facility] for tasks assessed/performed       Past Medical History:  Diagnosis Date  . Arthritis    OSTEO  . Atrial fibrillation and flutter ([place])   . Bladder cancer ([place])   . Coronary artery disease   . Dysrhythmia   . Exertional angina ([place]) 07/08/2014  . H/O urinary frequency   . Hematuria   . Hypertension   . Iron deficiency anemia   . Kidney stones    hx of   . Mild obstructive sleep apnea    no cpap  . S/P coronary artery stent placement 07/24/2014  . Weakness     Past Surgical History:  Procedure Laterality Date  . CARDIAC CATHETERIZATION    . CARDIOVASCULAR STRESS TEST  12-15-2013  DR Kate Sable ([place])   MILD - MODERATE PERI-INFARCT ISCHEMIA  OF INFERIOR WALL, MID-INFEROSEPTAL WALL, MID-INFEROLATERAL WALL, & BASAL INFERIOR WALL/   NORMAL LVF /  EF 59%/  INTERMEDIATE RISK STUDY  . COLONOSCOPY  07/04/2012   Procedure: COLONOSCOPY;  Surgeon: Rogene Houston, MD;  [facility];  Service: Endoscopy;  Laterality: N/A;  320  . CORONARY STENT PLACEMENT  07/08/2014   PTCA of RCA   DR COOPER  . CYSTOSCOPY W/ RETROGRADES Bilateral 12/31/2013   Procedure: CYSTOSCOPY WITH RETROGRADE  PYELOGRAM;  Surgeon: Alexis Frock, MD;  [facility];  Service: Urology;  Laterality: Bilateral;  . CYSTOSCOPY W/ RETROGRADES Bilateral 01/20/2015   Procedure: CYSTOSCOPY WITH RETROGRADE PYELOGRAM;  Surgeon: Alexis Frock, MD;  [facility];  Service: Urology;  Laterality: Bilateral;  . ESOPHAGOGASTRODUODENOSCOPY  07/04/2012   Procedure: ESOPHAGOGASTRODUODENOSCOPY (EGD);  Surgeon: Rogene Houston, MD;  [facility];  Service: Endoscopy;  Laterality: N/A;  . EYE SURGERY     past cataract surgery bilat  . HERNIA REPAIR Right   . LEFT HEART CATHETERIZATION WITH CORONARY ANGIOGRAM N/A 07/08/2014   Procedure: LEFT HEART CATHETERIZATION WITH CORONARY ANGIOGRAM;  Surgeon: Blane Ohara, MD;  Location: [facility] CATH LAB;  Service: Cardiovascular;  Laterality: N/A;  . PERCUTANEOUS CORONARY STENT INTERVENTION (PCI-S)  07/08/2014   Procedure: PERCUTANEOUS CORONARY STENT INTERVENTION (PCI-S);  Surgeon: Blane Ohara, MD;  Location: [facility] CATH LAB;  Service: Cardiovascular;;  . TOTAL KNEE ARTHROPLASTY Bilateral 2000  &  2003?  . TRANSTHORACIC ECHOCARDIOGRAM  09-11-2013   MODERATE LVH/  EF 55-60%/  MILD MR & TR/  MILD TO MODERATE CALCIFIED AV WITHOUT STENOSIS/  MILD LAE/   MODERATE PR  . TRANSURETHRAL RESECTION OF BLADDER TUMOR N/A 01/20/2015   Procedure: TRANSURETHRAL RESECTION OF BLADDER  TUMOR (TURBT);  Surgeon: Alexis Frock, MD;  [facility];  Service: Urology;  Laterality: N/A;  . TRANSURETHRAL RESECTION OF BLADDER TUMOR WITH GYRUS (TURBT-GYRUS) N/A 12/31/2013   Procedure: TRANSURETHRAL RESECTION OF BLADDER TUMOR WITH GYRUS (TURBT-GYRUS);  Surgeon: Alexis Frock, MD;  [facility];  Service: Urology;  Laterality: N/A;  . ZENKER'S DIVERTICULECTOMY N/A 09/09/2015   Procedure: ENDOSCOPIC ZENKER'S DIVERTICULECTOMY;  Surgeon: Izora Gala, MD;  Location: [place];  Service: ENT;  Laterality: N/A;  . ZENKER'S DIVERTICULECTOMY ENDOSCOPIC  09/09/2015    There were no vitals filed for this  visit.  Subjective Assessment - 11/29/17 1355    Subjective  Patient stated he is still doing all of his exercises at home and stated that his legs have felt better since he did the hamstring stretch at home. Patient denied any pain currently.     Pertinent History  Bladder cancer; Dysphagia; cardiac arrhythmia; hypertension; Atrial fibrillation; bilateral knee replacements    Limitations  Standing;Walking;House hold activities    How long can you sit comfortably?  Not limited    How long can you stand comfortably?  5 minutes; legs give out legs hurt     How long can you walk comfortably?  1 minute or less and legs feel like they will give out    Diagnostic tests  CT of head on 03/06/17: Spondylosis greatest on left C5-C6; negative for acute intracranial findings    Patient Stated Goals  To improve strength in legs and balance    Currently in Pain?  No/denies                        [facility] Adult PT Treatment/Exercise - 11/29/17 0001      Knee/Hip Exercises: Seated   Sit to Sand  2 sets;without UE support;10 reps;Other (comment) from standard height chair          Balance Exercises - 11/29/17 1352      Balance Exercises: Standing   Tandem Stance  Eyes open;Foam/compliant surface;3 reps;30 secs;Other (comment) intermittent upper extremity assistance    Step Ups  6 inch;Forward;UE support 1 Left and rigth x 12 each lower extremity    Gait with Head Turns  Forward;Other reps (comment);Other (comment) 14 feet x 6 repetitions head turns left and right    Tandem Gait  Forward;Other (comment);4 reps Minimal assistance for balance semitandem gait 4 reps x14 ft    Step Over Hurdles / Cones  Outside of parallel bars stepping over 4 6 inch hurdles with minimal assist and handhold assist of therapist ambulating 14 feet x 4    Other Standing Exercises  Ladder 14 feet x 6 with cognitive dual task listing items        PT Education - 11/29/17 1436    Education provided  Yes     Education Details  Patient was educated on purpose and technique of exercises throughout session.     Person(s) Educated  Patient    Methods  Explanation;Tactile cues;Verbal cues;Demonstration    Comprehension  Verbalized understanding;Returned demonstration;Verbal cues required;Tactile cues required       PT Short Term Goals - 11/15/17 1357      PT SHORT TERM GOAL #1   Title  Patient will demonstrate understanding a report regular compliance with home exercise program.     Baseline  every day that he is not in therapy    Time  4    Period  Weeks    Status  Achieved      PT SHORT TERM GOAL #2   Title  Patient will demonstrate improved MMT score by 1/2 grade in all deficient planes of lower extremities in order to assist with functional mobility.     Baseline  11/15/17 - some groups improved by 1/2 grade     Time  4    Period  Weeks    Status  Partially Met      PT SHORT TERM GOAL #3   Title  Patient will report ability to stand for 10 minutes in order to assist with improved performance of functional actitivities at home.     Baseline  11/15/17: Patient reports that he feels he cannot stand for more than 5 minutes currently.     Time  4    Period  Weeks    Status  On-going      PT SHORT TERM GOAL #4   Title  Patient will report pain in legs no greater than 4/10 over the course of a 1 week period indicating improved activity tolerance and improved quality of life.     Baseline  11/15/17: Patient reported no pain currently, but a 5/10 pain in his legs over the last week.     Time  4    Period  Weeks    Status  On-going        PT Long Term Goals - 11/15/17 1714      PT LONG TERM GOAL #1   Title  Patient will demonstrate improved MMT score by 1 grade in all deficient planes of lower extremities in order to assist with functional mobility.     Baseline  11/15/17 - some improved by 1/2 grade    Time  8    Period  Weeks    Status  On-going      PT LONG TERM GOAL #2   Title   Patient's FOTO score will improve by 15% indicating patient's improved perceived functional ability and better tolerance of functional activities.     Baseline  10/23/17: Patient's FOTO score is now at 12% or 88% limited.     Time  8    Period  Weeks    Status  On-going      PT LONG TERM GOAL #3   Title  Patient will demonstrate ability to perform TUG in 14 seconds or less indicating a decreased risk of falls.     Baseline  11/15/17 - 20.31 seconds (was 29.46 seconds)    Time  8    Period  Weeks    Status  On-going            Plan - 11/29/17 1437    Clinical Impression Statement  This session focused primarily on standing balance and dynamic balance activities. This session added ambulation over ladder on the ground with patient listing different items as he walked. Noted decreased velocity with listing items this session. Patient also required minimal guarding and occasional minimal assistance for loss of balance with this activity. Patient continues to demonstrate some momentum strategy with sit to stands from standard height chair. Patient's SpO2 was monitored throughout session and remained between 97% and 98% throughout with therapist allowing patient to take therapeutic rest breaks throughout session as needed. Patient would benefit from skilled physical therapy to continue addressing patient's deficits in balance, endurance, strength and overall functional mobility.     Rehab Potential  Fair    Clinical Impairments Affecting Rehab Potential  Positve: Patient's motivation,  patient's positive attitude. Negative: Chronicity of issue; frequent falls; medical complexity     PT Frequency  2x / week    PT Duration  4 weeks    PT Treatment/Interventions  ADLs/Self Care Home Management;Aquatic Therapy;DME Instruction;Gait training;Stair training;Functional mobility training;Therapeutic activities;Therapeutic exercise;Balance training;Neuromuscular re-education;Patient/family education;Manual  techniques;Passive range of motion;Energy conservation    PT Next Visit Plan  Continue to perform functional strengthening for lower extremities such as sit to stand and continue step ups 6in; continue progress dynamic balance; potentially trial ambulation with rolling walker    PT Home Exercise Plan  Seated heelraises 15 x 1x/day; LAQ x 15 each leg 1x/day; Hip Abduction/adduction x 15 1x/day; Marching alternating legs x 20 total 1x/day; 11/27/17: Seated hamstring stretch 3x30 seconds each leg    Consulted and Agree with Plan of Care  Patient       Patient will benefit from skilled therapeutic intervention in order to improve the following deficits and impairments:  Abnormal gait, Decreased balance, Decreased endurance, Decreased mobility, Difficulty walking, Hypomobility, Decreased range of motion, Improper body mechanics, Impaired tone, Decreased activity tolerance, Decreased coordination, Decreased strength, Impaired flexibility, Postural dysfunction, Pain  Visit Diagnosis: Weakness of left leg  Muscle weakness (generalized)  Decreased range of motion  Balance problem  Other abnormalities of gait and mobility     Problem List Patient Active Problem List   Diagnosis Date Noted  . Acute respiratory failure with hypoxia ([place])   . Influenza with pneumonia   . Palliative care encounter   . Sepsis ([place]) 09/25/2017  . Fever 09/25/2017  . UTI (urinary tract infection) 09/25/2017  . Abnormal liver function 09/25/2017  . Tachycardia 09/25/2017  . Influenza A   . Pressure ulcer 09/14/2015  . Zenker's diverticulum 09/09/2015  . Bladder cancer ([place]) 07/14/2015  . CAD (coronary artery disease) 07/24/2014  . S/P coronary artery stent placement (RCA bare metal stent on 07/08/14) 07/24/2014  . Exertional angina ([place]) 07/08/2014  . Encounter for therapeutic drug monitoring 09/12/2013  . Atrial fibrillation ([place]) 09/08/2013  . HTN (hypertension) 09/08/2013  . Fatigue 09/08/2013  . Anemia  06/26/2012   Clarene Critchley PT, DPT 2:43 PM, 11/29/17 [place] [address] [place], [place], [address] Phone: [phone]   Fax:  [phone]  Name: Manuel Le MRN: [id number] Date of Birth: May 03, 1927

## 2017-12-04 ENCOUNTER — Encounter (HOSPITAL_COMMUNITY): Payer: Self-pay | Admitting: Physical Therapy

## 2017-12-04 ENCOUNTER — Ambulatory Visit (HOSPITAL_COMMUNITY): Payer: Medicare Other | Admitting: Physical Therapy

## 2017-12-04 DIAGNOSIS — M2560 Stiffness of unspecified joint, not elsewhere classified: Secondary | ICD-10-CM | POA: Diagnosis not present

## 2017-12-04 DIAGNOSIS — R29898 Other symptoms and signs involving the musculoskeletal system: Secondary | ICD-10-CM

## 2017-12-04 DIAGNOSIS — R2689 Other abnormalities of gait and mobility: Secondary | ICD-10-CM

## 2017-12-04 DIAGNOSIS — M6281 Muscle weakness (generalized): Secondary | ICD-10-CM | POA: Diagnosis not present

## 2017-12-04 NOTE — Therapy (Signed)
[place], [place], [address] Phone: [phone]   Fax:  [phone]  Physical Therapy Treatment  Patient Details  Name: Manuel Le MRN: [id number] Date of Birth: Aug 24, 1926 Referring Provider: Dr. Delaine Lame   Encounter Date: 12/04/2017  PT End of Session - 12/04/17 1359    Visit Number  12    Number of Visits  25    Date for PT Re-Evaluation  12/13/17    Authorization Type  Medicare     Authorization Time Period  09/18/17 - 3/87/56 (new cert: 4/33/29-01/06/83)    PT Start Time  1353    PT Stop Time  1432    PT Time Calculation (min)  39 min    Equipment Utilized During Treatment  Gait belt    Activity Tolerance  No increased pain;Patient tolerated treatment well    Behavior During Therapy  [facility] for tasks assessed/performed       Past Medical History:  Diagnosis Date  . Arthritis    OSTEO  . Atrial fibrillation and flutter ([place])   . Bladder cancer ([place])   . Coronary artery disease   . Dysrhythmia   . Exertional angina ([place]) 07/08/2014  . H/O urinary frequency   . Hematuria   . Hypertension   . Iron deficiency anemia   . Kidney stones    hx of   . Mild obstructive sleep apnea    no cpap  . S/P coronary artery stent placement 07/24/2014  . Weakness     Past Surgical History:  Procedure Laterality Date  . CARDIAC CATHETERIZATION    . CARDIOVASCULAR STRESS TEST  12-15-2013  DR Kate Sable ([place])   MILD - MODERATE PERI-INFARCT ISCHEMIA  OF INFERIOR WALL, MID-INFEROSEPTAL WALL, MID-INFEROLATERAL WALL, & BASAL INFERIOR WALL/   NORMAL LVF /  EF 59%/  INTERMEDIATE RISK STUDY  . COLONOSCOPY  07/04/2012   Procedure: COLONOSCOPY;  Surgeon: Rogene Houston, MD;  [facility];  Service: Endoscopy;  Laterality: N/A;  320  . CORONARY STENT PLACEMENT  07/08/2014   PTCA of RCA   DR COOPER  . CYSTOSCOPY W/ RETROGRADES Bilateral 12/31/2013   Procedure: CYSTOSCOPY WITH RETROGRADE  PYELOGRAM;  Surgeon: Alexis Frock, MD;  [facility];  Service: Urology;  Laterality: Bilateral;  . CYSTOSCOPY W/ RETROGRADES Bilateral 01/20/2015   Procedure: CYSTOSCOPY WITH RETROGRADE PYELOGRAM;  Surgeon: Alexis Frock, MD;  [facility];  Service: Urology;  Laterality: Bilateral;  . ESOPHAGOGASTRODUODENOSCOPY  07/04/2012   Procedure: ESOPHAGOGASTRODUODENOSCOPY (EGD);  Surgeon: Rogene Houston, MD;  [facility];  Service: Endoscopy;  Laterality: N/A;  . EYE SURGERY     past cataract surgery bilat  . HERNIA REPAIR Right   . LEFT HEART CATHETERIZATION WITH CORONARY ANGIOGRAM N/A 07/08/2014   Procedure: LEFT HEART CATHETERIZATION WITH CORONARY ANGIOGRAM;  Surgeon: Blane Ohara, MD;  Location: [facility] CATH LAB;  Service: Cardiovascular;  Laterality: N/A;  . PERCUTANEOUS CORONARY STENT INTERVENTION (PCI-S)  07/08/2014   Procedure: PERCUTANEOUS CORONARY STENT INTERVENTION (PCI-S);  Surgeon: Blane Ohara, MD;  Location: [facility] CATH LAB;  Service: Cardiovascular;;  . TOTAL KNEE ARTHROPLASTY Bilateral 2000  &  2003?  . TRANSTHORACIC ECHOCARDIOGRAM  09-11-2013   MODERATE LVH/  EF 55-60%/  MILD MR & TR/  MILD TO MODERATE CALCIFIED AV WITHOUT STENOSIS/  MILD LAE/   MODERATE PR  . TRANSURETHRAL RESECTION OF BLADDER TUMOR N/A 01/20/2015   Procedure: TRANSURETHRAL RESECTION OF BLADDER  TUMOR (TURBT);  Surgeon: Alexis Frock, MD;  [facility];  Service: Urology;  Laterality: N/A;  . TRANSURETHRAL RESECTION OF BLADDER TUMOR WITH GYRUS (TURBT-GYRUS) N/A 12/31/2013   Procedure: TRANSURETHRAL RESECTION OF BLADDER TUMOR WITH GYRUS (TURBT-GYRUS);  Surgeon: Alexis Frock, MD;  [facility];  Service: Urology;  Laterality: N/A;  . ZENKER'S DIVERTICULECTOMY N/A 09/09/2015   Procedure: ENDOSCOPIC ZENKER'S DIVERTICULECTOMY;  Surgeon: Izora Gala, MD;  Location: [place];  Service: ENT;  Laterality: N/A;  . ZENKER'S DIVERTICULECTOMY ENDOSCOPIC  09/09/2015    There were no vitals filed for this  visit.  Subjective Assessment - 12/04/17 1358    Subjective  Patient stated that he has been doing his exercises at home. He denied any pain currently.     Pertinent History  Bladder cancer; Dysphagia; cardiac arrhythmia; hypertension; Atrial fibrillation; bilateral knee replacements    Limitations  Standing;Walking;House hold activities    How long can you sit comfortably?  Not limited    How long can you stand comfortably?  5 minutes; legs give out legs hurt     How long can you walk comfortably?  1 minute or less and legs feel like they will give out    Diagnostic tests  CT of head on 03/06/17: Spondylosis greatest on left C5-C6; negative for acute intracranial findings    Patient Stated Goals  To improve strength in legs and balance    Currently in Pain?  No/denies                       [facility] Adult PT Treatment/Exercise - 12/04/17 0001      Knee/Hip Exercises: Standing   Lateral Step Up  Right;Left;1 set;10 reps;Step Height: 6";Hand Hold: 1      Knee/Hip Exercises: Seated   Sit to Sand  2 sets;without UE support;10 reps;Other (comment) From standard height chair          Balance Exercises - 12/04/17 1358      Balance Exercises: Standing   Tandem Stance  Eyes open;Foam/compliant surface;3 reps;30 secs;Other (comment) intermittent upper extremity assistance    Step Ups  6 inch;Forward;UE support 1 x12 each lower extremity     Gait with Head Turns  Forward;Other reps (comment);Other (comment) 14 feet x 6 head turns left and right    Tandem Gait  Forward;Other (comment);4 reps Moderate assistance for balance semitandem gait 4x 14 feet     Step Over Hurdles / Cones  Outside of parallel bars stepping over 4 6 inch hurdles with minimal assist and handhold assist of therapist ambulating 14 feet x 4    Other Standing Exercises  Ambulating 14 feet x 4 weaving in between 4 cones. Without cognitive dual task on first 2 trials. With cognitive dual task on second 2 trials.          PT Education - 12/04/17 1529    Education provided  Yes    Education Details  Patient was educated on purpose and technqiue of exercises throguhout session.     Person(s) Educated  Patient    Methods  Explanation;Demonstration;Tactile cues;Verbal cues    Comprehension  Verbalized understanding;Returned demonstration;Verbal cues required;Tactile cues required       PT Short Term Goals - 11/15/17 1357      PT SHORT TERM GOAL #1   Title  Patient will demonstrate understanding a report regular compliance with home exercise program.     Baseline  every day that he is not in therapy  Time  4    Period  Weeks    Status  Achieved      PT SHORT TERM GOAL #2   Title  Patient will demonstrate improved MMT score by 1/2 grade in all deficient planes of lower extremities in order to assist with functional mobility.     Baseline  11/15/17 - some groups improved by 1/2 grade     Time  4    Period  Weeks    Status  Partially Met      PT SHORT TERM GOAL #3   Title  Patient will report ability to stand for 10 minutes in order to assist with improved performance of functional actitivities at home.     Baseline  11/15/17: Patient reports that he feels he cannot stand for more than 5 minutes currently.     Time  4    Period  Weeks    Status  On-going      PT SHORT TERM GOAL #4   Title  Patient will report pain in legs no greater than 4/10 over the course of a 1 week period indicating improved activity tolerance and improved quality of life.     Baseline  11/15/17: Patient reported no pain currently, but a 5/10 pain in his legs over the last week.     Time  4    Period  Weeks    Status  On-going        PT Long Term Goals - 11/15/17 1714      PT LONG TERM GOAL #1   Title  Patient will demonstrate improved MMT score by 1 grade in all deficient planes of lower extremities in order to assist with functional mobility.     Baseline  11/15/17 - some improved by 1/2 grade    Time  8     Period  Weeks    Status  On-going      PT LONG TERM GOAL #2   Title  Patient's FOTO score will improve by 15% indicating patient's improved perceived functional ability and better tolerance of functional activities.     Baseline  10/23/17: Patient's FOTO score is now at 12% or 88% limited.     Time  8    Period  Weeks    Status  On-going      PT LONG TERM GOAL #3   Title  Patient will demonstrate ability to perform TUG in 14 seconds or less indicating a decreased risk of falls.     Baseline  11/15/17 - 20.31 seconds (was 29.46 seconds)    Time  8    Period  Weeks    Status  On-going            Plan - 12/04/17 1529    Clinical Impression Statement  This session continued to progress patient with lower extremity strengthening exercises and with balance exercises. This session added lateral step ups which patient required verbal cues to stand up tall on each trial. With performing tandem ambulation this session patient demonstrated loss of balance frequently throughout requiring assistance to regain balance. This session added weaving through cones to further challenge patient's balance incorporating dual task, which decreased patient's velocity with the task. Patient's SpO2 was monitored throughout session and found to be between 97-98%. Patient was provided therapeutic rest breaks as needed as patient's endurance and tolerance is still limited. Plan to continue with balance and strengthening exercises.     Rehab Potential  Fair  Clinical Impairments Affecting Rehab Potential  Positve: Patient's motivation, patient's positive attitude. Negative: Chronicity of issue; frequent falls; medical complexity     PT Frequency  2x / week    PT Duration  4 weeks    PT Treatment/Interventions  ADLs/Self Care Home Management;Aquatic Therapy;DME Instruction;Gait training;Stair training;Functional mobility training;Therapeutic activities;Therapeutic exercise;Balance training;Neuromuscular  re-education;Patient/family education;Manual techniques;Passive range of motion;Energy conservation    PT Next Visit Plan  Continue to perform functional strengthening for lower extremities such as sit to stand and continue step ups 6in; continue progress dynamic balance; potentially trial ambulation with rolling walker    PT Home Exercise Plan  Seated heelraises 15 x 1x/day; LAQ x 15 each leg 1x/day; Hip Abduction/adduction x 15 1x/day; Marching alternating legs x 20 total 1x/day; 11/27/17: Seated hamstring stretch 3x30 seconds each leg    Consulted and Agree with Plan of Care  Patient       Patient will benefit from skilled therapeutic intervention in order to improve the following deficits and impairments:  Abnormal gait, Decreased balance, Decreased endurance, Decreased mobility, Difficulty walking, Hypomobility, Decreased range of motion, Improper body mechanics, Impaired tone, Decreased activity tolerance, Decreased coordination, Decreased strength, Impaired flexibility, Postural dysfunction, Pain  Visit Diagnosis: Weakness of left leg  Muscle weakness (generalized)  Decreased range of motion  Balance problem  Other abnormalities of gait and mobility     Problem List Patient Active Problem List   Diagnosis Date Noted  . Acute respiratory failure with hypoxia ([place])   . Influenza with pneumonia   . Palliative care encounter   . Sepsis ([place]) 09/25/2017  . Fever 09/25/2017  . UTI (urinary tract infection) 09/25/2017  . Abnormal liver function 09/25/2017  . Tachycardia 09/25/2017  . Influenza A   . Pressure ulcer 09/14/2015  . Zenker's diverticulum 09/09/2015  . Bladder cancer ([place]) 07/14/2015  . CAD (coronary artery disease) 07/24/2014  . S/P coronary artery stent placement (RCA bare metal stent on 07/08/14) 07/24/2014  . Exertional angina ([place]) 07/08/2014  . Encounter for therapeutic drug monitoring 09/12/2013  . Atrial fibrillation ([place]) 09/08/2013  . HTN (hypertension)  09/08/2013  . Fatigue 09/08/2013  . Anemia 06/26/2012   Clarene Critchley PT, DPT 3:37 PM, 12/04/17 [place], [place], [address] Phone: [phone]   Fax:  [phone]  Name: Manuel Le MRN: [id number] Date of Birth: 07-May-1927

## 2017-12-06 ENCOUNTER — Ambulatory Visit (HOSPITAL_COMMUNITY): Payer: Medicare Other | Admitting: Physical Therapy

## 2017-12-06 ENCOUNTER — Encounter (HOSPITAL_COMMUNITY): Payer: Self-pay | Admitting: Physical Therapy

## 2017-12-06 DIAGNOSIS — R29898 Other symptoms and signs involving the musculoskeletal system: Secondary | ICD-10-CM

## 2017-12-06 DIAGNOSIS — M2560 Stiffness of unspecified joint, not elsewhere classified: Secondary | ICD-10-CM

## 2017-12-06 DIAGNOSIS — R2689 Other abnormalities of gait and mobility: Secondary | ICD-10-CM

## 2017-12-06 DIAGNOSIS — M6281 Muscle weakness (generalized): Secondary | ICD-10-CM | POA: Diagnosis not present

## 2017-12-06 NOTE — Therapy (Signed)
[place], [place], [address] Phone: [phone]   Fax:  [phone]  Physical Therapy Treatment  Patient Details  Name: Manuel Le MRN: [id number] Date of Birth: 10/07/1926 Referring Provider: Dr. Delaine Lame   Encounter Date: 12/06/2017  PT End of Session - 12/06/17 1401    Visit Number  13    Number of Visits  25    Date for PT Re-Evaluation  12/13/17    Authorization Type  Medicare     Authorization Time Period  09/18/17 - 12/21/52 (new cert: 6/56/81-2/75/17)    PT Start Time  1341    PT Stop Time  1425    PT Time Calculation (min)  44 min    Equipment Utilized During Treatment  Gait belt    Activity Tolerance  No increased pain;Patient tolerated treatment well    Behavior During Therapy  [facility] for tasks assessed/performed       Past Medical History:  Diagnosis Date  . Arthritis    OSTEO  . Atrial fibrillation and flutter ([place])   . Bladder cancer ([place])   . Coronary artery disease   . Dysrhythmia   . Exertional angina ([place]) 07/08/2014  . H/O urinary frequency   . Hematuria   . Hypertension   . Iron deficiency anemia   . Kidney stones    hx of   . Mild obstructive sleep apnea    no cpap  . S/P coronary artery stent placement 07/24/2014  . Weakness     Past Surgical History:  Procedure Laterality Date  . CARDIAC CATHETERIZATION    . CARDIOVASCULAR STRESS TEST  12-15-2013  DR Kate Sable ([place])   MILD - MODERATE PERI-INFARCT ISCHEMIA  OF INFERIOR WALL, MID-INFEROSEPTAL WALL, MID-INFEROLATERAL WALL, & BASAL INFERIOR WALL/   NORMAL LVF /  EF 59%/  INTERMEDIATE RISK STUDY  . COLONOSCOPY  07/04/2012   Procedure: COLONOSCOPY;  Surgeon: Rogene Houston, MD;  [facility];  Service: Endoscopy;  Laterality: N/A;  320  . CORONARY STENT PLACEMENT  07/08/2014   PTCA of RCA   DR COOPER  . CYSTOSCOPY W/ RETROGRADES Bilateral 12/31/2013   Procedure: CYSTOSCOPY WITH RETROGRADE  PYELOGRAM;  Surgeon: Alexis Frock, MD;  [facility];  Service: Urology;  Laterality: Bilateral;  . CYSTOSCOPY W/ RETROGRADES Bilateral 01/20/2015   Procedure: CYSTOSCOPY WITH RETROGRADE PYELOGRAM;  Surgeon: Alexis Frock, MD;  [facility];  Service: Urology;  Laterality: Bilateral;  . ESOPHAGOGASTRODUODENOSCOPY  07/04/2012   Procedure: ESOPHAGOGASTRODUODENOSCOPY (EGD);  Surgeon: Rogene Houston, MD;  [facility];  Service: Endoscopy;  Laterality: N/A;  . EYE SURGERY     past cataract surgery bilat  . HERNIA REPAIR Right   . LEFT HEART CATHETERIZATION WITH CORONARY ANGIOGRAM N/A 07/08/2014   Procedure: LEFT HEART CATHETERIZATION WITH CORONARY ANGIOGRAM;  Surgeon: Blane Ohara, MD;  Location: [facility] CATH LAB;  Service: Cardiovascular;  Laterality: N/A;  . PERCUTANEOUS CORONARY STENT INTERVENTION (PCI-S)  07/08/2014   Procedure: PERCUTANEOUS CORONARY STENT INTERVENTION (PCI-S);  Surgeon: Blane Ohara, MD;  Location: [facility] CATH LAB;  Service: Cardiovascular;;  . TOTAL KNEE ARTHROPLASTY Bilateral 2000  &  2003?  . TRANSTHORACIC ECHOCARDIOGRAM  09-11-2013   MODERATE LVH/  EF 55-60%/  MILD MR & TR/  MILD TO MODERATE CALCIFIED AV WITHOUT STENOSIS/  MILD LAE/   MODERATE PR  . TRANSURETHRAL RESECTION OF BLADDER TUMOR N/A 01/20/2015   Procedure: TRANSURETHRAL RESECTION OF BLADDER  TUMOR (TURBT);  Surgeon: Alexis Frock, MD;  [facility];  Service: Urology;  Laterality: N/A;  . TRANSURETHRAL RESECTION OF BLADDER TUMOR WITH GYRUS (TURBT-GYRUS) N/A 12/31/2013   Procedure: TRANSURETHRAL RESECTION OF BLADDER TUMOR WITH GYRUS (TURBT-GYRUS);  Surgeon: Alexis Frock, MD;  [facility];  Service: Urology;  Laterality: N/A;  . ZENKER'S DIVERTICULECTOMY N/A 09/09/2015   Procedure: ENDOSCOPIC ZENKER'S DIVERTICULECTOMY;  Surgeon: Izora Gala, MD;  Location: [place];  Service: ENT;  Laterality: N/A;  . ZENKER'S DIVERTICULECTOMY ENDOSCOPIC  09/09/2015    There were no vitals filed for this  visit.  Subjective Assessment - 12/06/17 1357    Subjective  Patient stated that yesterday morning his legs felt better than they have in years. He stated he thinks that therapy is helping. Patient stated he has been doing his exercises.     Pertinent History  Bladder cancer; Dysphagia; cardiac arrhythmia; hypertension; Atrial fibrillation; bilateral knee replacements    Limitations  Standing;Walking;House hold activities    How long can you sit comfortably?  Not limited    How long can you stand comfortably?  5 minutes; legs give out legs hurt     How long can you walk comfortably?  1 minute or less and legs feel like they will give out    Diagnostic tests  CT of head on 03/06/17: Spondylosis greatest on left C5-C6; negative for acute intracranial findings    Patient Stated Goals  To improve strength in legs and balance    Currently in Pain?  No/denies    Multiple Pain Sites  No                       [facility] Adult PT Treatment/Exercise - 12/06/17 0001      Knee/Hip Exercises: Standing   Lateral Step Up  Right;Left;1 set;10 reps;Step Height: 6";Hand Hold: 1    Gait Training  Ambulating 376 feet with quad cane with seated rest break and then ambulating 226 feet with seated rest break. Verbal cues and tactile cues for upright standing and for improved foot clearance      Knee/Hip Exercises: Seated   Sit to Sand  2 sets;without UE support;10 reps;Other (comment) From standard height chair. Using upper extremity assist x 2          Balance Exercises - 12/06/17 1402      Balance Exercises: Standing   Tandem Stance  Eyes open;Foam/compliant surface;3 reps;30 secs;Other (comment) Intermittent upper extremity support    Step Ups  6 inch;Forward;UE support 1 x12 each lower extremity    Step Over Hurdles / Cones  Outside of parallel bars stepping over 4 6 inch hurdles with minimal assist and handhold assist of therapist ambulating 14 feet x 6    Other Standing Exercises   Standing on foam with narrow base of support throwing foam balls x 10 to challenge balance. Intermittent Upper extremity support in parallel bars.        PT Education - 12/06/17 1359    Education provided  Yes    Education Details  Patient was educated on purpose and technique of exercises throughout session.     Person(s) Educated  Patient    Methods  Explanation;Verbal cues;Tactile cues;Demonstration    Comprehension  Verbalized understanding;Returned demonstration;Verbal cues required;Tactile cues required       PT Short Term Goals - 11/15/17 1357      PT SHORT TERM GOAL #1   Title  Patient will demonstrate understanding a report  regular compliance with home exercise program.     Baseline  every day that he is not in therapy    Time  4    Period  Weeks    Status  Achieved      PT SHORT TERM GOAL #2   Title  Patient will demonstrate improved MMT score by 1/2 grade in all deficient planes of lower extremities in order to assist with functional mobility.     Baseline  11/15/17 - some groups improved by 1/2 grade     Time  4    Period  Weeks    Status  Partially Met      PT SHORT TERM GOAL #3   Title  Patient will report ability to stand for 10 minutes in order to assist with improved performance of functional actitivities at home.     Baseline  11/15/17: Patient reports that he feels he cannot stand for more than 5 minutes currently.     Time  4    Period  Weeks    Status  On-going      PT SHORT TERM GOAL #4   Title  Patient will report pain in legs no greater than 4/10 over the course of a 1 week period indicating improved activity tolerance and improved quality of life.     Baseline  11/15/17: Patient reported no pain currently, but a 5/10 pain in his legs over the last week.     Time  4    Period  Weeks    Status  On-going        PT Long Term Goals - 11/15/17 1714      PT LONG TERM GOAL #1   Title  Patient will demonstrate improved MMT score by 1 grade in all  deficient planes of lower extremities in order to assist with functional mobility.     Baseline  11/15/17 - some improved by 1/2 grade    Time  8    Period  Weeks    Status  On-going      PT LONG TERM GOAL #2   Title  Patient's FOTO score will improve by 15% indicating patient's improved perceived functional ability and better tolerance of functional activities.     Baseline  10/23/17: Patient's FOTO score is now at 12% or 88% limited.     Time  8    Period  Weeks    Status  On-going      PT LONG TERM GOAL #3   Title  Patient will demonstrate ability to perform TUG in 14 seconds or less indicating a decreased risk of falls.     Baseline  11/15/17 - 20.31 seconds (was 29.46 seconds)    Time  8    Period  Weeks    Status  On-going            Plan - 12/06/17 1545    Clinical Impression Statement  This session patient demonstrated ability to ambulate 376 feet with quad cane and following a seated rest break ambulate an additional 226 feet. This was the furthest the patient had walked continuously. Then session progressed to patient performing exercises to improve lower extremity strength and balance. This session patient performed standing on foam with narrow base of support and upper extremity movements to challenge balance. Patient's SpO2 was measured throughout session and found to be 96-97% throughout session. Patient reported fatigue frequently throughout session and was provided therapeutic rest breaks as needed. Plan to continue practice  of balance exercises, lower extremity strengthening exercises and gait training.     Rehab Potential  Fair    Clinical Impairments Affecting Rehab Potential  Positve: Patient's motivation, patient's positive attitude. Negative: Chronicity of issue; frequent falls; medical complexity     PT Frequency  2x / week    PT Duration  4 weeks    PT Treatment/Interventions  ADLs/Self Care Home Management;Aquatic Therapy;DME Instruction;Gait training;Stair  training;Functional mobility training;Therapeutic activities;Therapeutic exercise;Balance training;Neuromuscular re-education;Patient/family education;Manual techniques;Passive range of motion;Energy conservation    PT Next Visit Plan  Continue to perform functional strengthening for lower extremities such as sit to stand and continue step ups 6in; continue progress dynamic balance; potentially trial ambulation with rolling walker    PT Home Exercise Plan  Seated heelraises 15 x 1x/day; LAQ x 15 each leg 1x/day; Hip Abduction/adduction x 15 1x/day; Marching alternating legs x 20 total 1x/day; 11/27/17: Seated hamstring stretch 3x30 seconds each leg    Consulted and Agree with Plan of Care  Patient       Patient will benefit from skilled therapeutic intervention in order to improve the following deficits and impairments:  Abnormal gait, Decreased balance, Decreased endurance, Decreased mobility, Difficulty walking, Hypomobility, Decreased range of motion, Improper body mechanics, Impaired tone, Decreased activity tolerance, Decreased coordination, Decreased strength, Impaired flexibility, Postural dysfunction, Pain  Visit Diagnosis: Weakness of left leg  Muscle weakness (generalized)  Decreased range of motion  Balance problem  Other abnormalities of gait and mobility     Problem List Patient Active Problem List   Diagnosis Date Noted  . Acute respiratory failure with hypoxia ([place])   . Influenza with pneumonia   . Palliative care encounter   . Sepsis ([place]) 09/25/2017  . Fever 09/25/2017  . UTI (urinary tract infection) 09/25/2017  . Abnormal liver function 09/25/2017  . Tachycardia 09/25/2017  . Influenza A   . Pressure ulcer 09/14/2015  . Zenker's diverticulum 09/09/2015  . Bladder cancer ([place]) 07/14/2015  . CAD (coronary artery disease) 07/24/2014  . S/P coronary artery stent placement (RCA bare metal stent on 07/08/14) 07/24/2014  . Exertional angina ([place]) 07/08/2014  .  Encounter for therapeutic drug monitoring 09/12/2013  . Atrial fibrillation ([place]) 09/08/2013  . HTN (hypertension) 09/08/2013  . Fatigue 09/08/2013  . Anemia 06/26/2012   Clarene Critchley PT, DPT 3:54 PM, 12/06/17 [place] [address] [place], [place], [address] Phone: [phone]   Fax:  [phone]  Name: JAUAN WOHL MRN: [id number] Date of Birth: [birth year]

## 2017-12-11 ENCOUNTER — Ambulatory Visit (HOSPITAL_COMMUNITY): Payer: Medicare Other | Admitting: Physical Therapy

## 2017-12-11 ENCOUNTER — Encounter (HOSPITAL_COMMUNITY): Payer: Self-pay | Admitting: Physical Therapy

## 2017-12-11 DIAGNOSIS — R29898 Other symptoms and signs involving the musculoskeletal system: Secondary | ICD-10-CM | POA: Diagnosis not present

## 2017-12-11 DIAGNOSIS — M2560 Stiffness of unspecified joint, not elsewhere classified: Secondary | ICD-10-CM

## 2017-12-11 DIAGNOSIS — R2689 Other abnormalities of gait and mobility: Secondary | ICD-10-CM

## 2017-12-11 DIAGNOSIS — M6281 Muscle weakness (generalized): Secondary | ICD-10-CM | POA: Diagnosis not present

## 2017-12-11 NOTE — Therapy (Signed)
[place], [place], [address] Phone: [phone]   Fax:  [phone]  Physical Therapy Treatment  Patient Details  Name: Manuel Le MRN: [id number] Date of Birth: 04/26/1927 Referring Provider: Dr. Delaine Lame   Encounter Date: 12/11/2017  PT End of Session - 12/11/17 1345    Visit Number  14    Number of Visits  25    Date for PT Re-Evaluation  12/13/17    Authorization Type  Medicare     Authorization Time Period  09/18/17 - 4/43/15 (new cert: 4/00/86-7/61/95)    PT Start Time  1300    PT Stop Time  1342    PT Time Calculation (min)  42 min    Equipment Utilized During Treatment  Gait belt    Activity Tolerance  No increased pain;Patient tolerated treatment well    Behavior During Therapy  [facility] for tasks assessed/performed       Past Medical History:  Diagnosis Date  . Arthritis    OSTEO  . Atrial fibrillation and flutter ([place])   . Bladder cancer ([place])   . Coronary artery disease   . Dysrhythmia   . Exertional angina ([place]) 07/08/2014  . H/O urinary frequency   . Hematuria   . Hypertension   . Iron deficiency anemia   . Kidney stones    hx of   . Mild obstructive sleep apnea    no cpap  . S/P coronary artery stent placement 07/24/2014  . Weakness     Past Surgical History:  Procedure Laterality Date  . CARDIAC CATHETERIZATION    . CARDIOVASCULAR STRESS TEST  12-15-2013  DR Kate Sable ([place])   MILD - MODERATE PERI-INFARCT ISCHEMIA  OF INFERIOR WALL, MID-INFEROSEPTAL WALL, MID-INFEROLATERAL WALL, & BASAL INFERIOR WALL/   NORMAL LVF /  EF 59%/  INTERMEDIATE RISK STUDY  . COLONOSCOPY  07/04/2012   Procedure: COLONOSCOPY;  Surgeon: Rogene Houston, MD;  [facility];  Service: Endoscopy;  Laterality: N/A;  320  . CORONARY STENT PLACEMENT  07/08/2014   PTCA of RCA   DR COOPER  . CYSTOSCOPY W/ RETROGRADES Bilateral 12/31/2013   Procedure: CYSTOSCOPY WITH RETROGRADE  PYELOGRAM;  Surgeon: Alexis Frock, MD;  Location: [facility];  Service: Urology;  Laterality: Bilateral;  . CYSTOSCOPY W/ RETROGRADES Bilateral 01/20/2015   Procedure: CYSTOSCOPY WITH RETROGRADE PYELOGRAM;  Surgeon: Alexis Frock, MD;  Location: [facility];  Service: Urology;  Laterality: Bilateral;  . ESOPHAGOGASTRODUODENOSCOPY  07/04/2012   Procedure: ESOPHAGOGASTRODUODENOSCOPY (EGD);  Surgeon: Rogene Houston, MD;  [facility];  Service: Endoscopy;  Laterality: N/A;  . EYE SURGERY     past cataract surgery bilat  . HERNIA REPAIR Right   . LEFT HEART CATHETERIZATION WITH CORONARY ANGIOGRAM N/A 07/08/2014   Procedure: LEFT HEART CATHETERIZATION WITH CORONARY ANGIOGRAM;  Surgeon: Blane Ohara, MD;  Location: [facility] CATH LAB;  Service: Cardiovascular;  Laterality: N/A;  . PERCUTANEOUS CORONARY STENT INTERVENTION (PCI-S)  07/08/2014   Procedure: PERCUTANEOUS CORONARY STENT INTERVENTION (PCI-S);  Surgeon: Blane Ohara, MD;  Location: [facility] CATH LAB;  Service: Cardiovascular;;  . TOTAL KNEE ARTHROPLASTY Bilateral 2000  &  2003?  . TRANSTHORACIC ECHOCARDIOGRAM  09-11-2013   MODERATE LVH/  EF 55-60%/  MILD MR & TR/  MILD TO MODERATE CALCIFIED AV WITHOUT STENOSIS/  MILD LAE/   MODERATE PR  . TRANSURETHRAL RESECTION OF BLADDER TUMOR N/A 01/20/2015   Procedure: TRANSURETHRAL RESECTION OF BLADDER  TUMOR (TURBT);  Surgeon: Alexis Frock, MD;  [facility];  Service: Urology;  Laterality: N/A;  . TRANSURETHRAL RESECTION OF BLADDER TUMOR WITH GYRUS (TURBT-GYRUS) N/A 12/31/2013   Procedure: TRANSURETHRAL RESECTION OF BLADDER TUMOR WITH GYRUS (TURBT-GYRUS);  Surgeon: Alexis Frock, MD;  Location: [facility];  Service: Urology;  Laterality: N/A;  . ZENKER'S DIVERTICULECTOMY N/A 09/09/2015   Procedure: ENDOSCOPIC ZENKER'S DIVERTICULECTOMY;  Surgeon: Izora Gala, MD;  Location: [facility];  Service: ENT;  Laterality: N/A;  . ZENKER'S DIVERTICULECTOMY ENDOSCOPIC  09/09/2015    There were no vitals filed for this  visit.  Subjective Assessment - 12/11/17 1309    Subjective  Patient stated that he has been doing his exercises at home, and that he is not having pain currently.     Pertinent History  Bladder cancer; Dysphagia; cardiac arrhythmia; hypertension; Atrial fibrillation; bilateral knee replacements    Limitations  Standing;Walking;House hold activities    How long can you sit comfortably?  Not limited    How long can you stand comfortably?  5 minutes; legs give out legs hurt     How long can you walk comfortably?  1 minute or less and legs feel like they will give out    Diagnostic tests  CT of head on 03/06/17: Spondylosis greatest on left C5-C6; negative for acute intracranial findings    Patient Stated Goals  To improve strength in legs and balance    Currently in Pain?  No/denies                       [facility] Adult PT Treatment/Exercise - 12/11/17 0001      Knee/Hip Exercises: Standing   Hip Extension  Stengthening;Right;Left;1 set;10 reps;Other (comment);Knee straight 2# ankle weights    Lateral Step Up  Right;Left;1 set;10 reps;Step Height: 6";Hand Hold: 1    Other Standing Knee Exercises  Sidestepping outside of parallel bars red theraband around ankles 14 feet x 2 each direction      Knee/Hip Exercises: Seated   Sit to Sand  2 sets;without UE support;10 reps;Other (comment) From standard height chair. 2nd set with 4# weight          Balance Exercises - 12/11/17 1310      Balance Exercises: Standing   Tandem Stance  Eyes open;Foam/compliant surface;Other (comment) Upper extremity flexion x 10 with 2# bar; each leg forward    Step Ups  6 inch;Forward;UE support 1 x12 each lower extremity    Tandem Gait  Forward;Other (comment);4 reps Moderate assistance for balance, semi-tandem 14 feet x 4    Step Over Hurdles / Cones  Outside of parallel bars stepping over 4 6 inch hurdles with minimal assist and handhold assist of therapist ambulating 14 feet x 4    Other  Standing Exercises  Standing on foam with narrow base of support throwing foam balls x 15 to challenge balance. Intermittent Upper extremity support in parallel bars.        PT Education - 12/11/17 1344    Education provided  Yes    Education Details  Patient was educated on purpose and technique of exercises throughout session.     Person(s) Educated  Patient    Methods  Explanation;Tactile cues;Verbal cues;Demonstration    Comprehension  Verbalized understanding;Returned demonstration;Tactile cues required;Verbal cues required       PT Short Term Goals - 11/15/17 1357      PT SHORT TERM GOAL #1   Title  Patient will demonstrate understanding  a report regular compliance with home exercise program.     Baseline  every day that he is not in therapy    Time  4    Period  Weeks    Status  Achieved      PT SHORT TERM GOAL #2   Title  Patient will demonstrate improved MMT score by 1/2 grade in all deficient planes of lower extremities in order to assist with functional mobility.     Baseline  11/15/17 - some groups improved by 1/2 grade     Time  4    Period  Weeks    Status  Partially Met      PT SHORT TERM GOAL #3   Title  Patient will report ability to stand for 10 minutes in order to assist with improved performance of functional actitivities at home.     Baseline  11/15/17: Patient reports that he feels he cannot stand for more than 5 minutes currently.     Time  4    Period  Weeks    Status  On-going      PT SHORT TERM GOAL #4   Title  Patient will report pain in legs no greater than 4/10 over the course of a 1 week period indicating improved activity tolerance and improved quality of life.     Baseline  11/15/17: Patient reported no pain currently, but a 5/10 pain in his legs over the last week.     Time  4    Period  Weeks    Status  On-going        PT Long Term Goals - 11/15/17 1714      PT LONG TERM GOAL #1   Title  Patient will demonstrate improved MMT score by 1  grade in all deficient planes of lower extremities in order to assist with functional mobility.     Baseline  11/15/17 - some improved by 1/2 grade    Time  8    Period  Weeks    Status  On-going      PT LONG TERM GOAL #2   Title  Patient's FOTO score will improve by 15% indicating patient's improved perceived functional ability and better tolerance of functional activities.     Baseline  10/23/17: Patient's FOTO score is now at 12% or 88% limited.     Time  8    Period  Weeks    Status  On-going      PT LONG TERM GOAL #3   Title  Patient will demonstrate ability to perform TUG in 14 seconds or less indicating a decreased risk of falls.     Baseline  11/15/17 - 20.31 seconds (was 29.46 seconds)    Time  8    Period  Weeks    Status  On-going            Plan - 12/11/17 1345    Clinical Impression Statement  This session continued to progress patient with balance exercises and lower extremity strengthening exercises. This session patient performed the second set of sit to stands with a 4 pound weight to further improve lower extremity strength. Patient also performed sidestepping with red theraband this session. Patient's SpO2 was monitored throughout session and found to be 93-97% throughout session. Patient was allowed therapeutic rest breaks as needed. Patient performed hip extension with 2# ankle weights this session. Plan to re-assess patient next session.     Rehab Potential  Fair    Clinical  Impairments Affecting Rehab Potential  Positve: Patient's motivation, patient's positive attitude. Negative: Chronicity of issue; frequent falls; medical complexity     PT Frequency  2x / week    PT Duration  4 weeks    PT Treatment/Interventions  ADLs/Self Care Home Management;Aquatic Therapy;DME Instruction;Gait training;Stair training;Functional mobility training;Therapeutic activities;Therapeutic exercise;Balance training;Neuromuscular re-education;Patient/family education;Manual  techniques;Passive range of motion;Energy conservation    PT Next Visit Plan  Continue to perform functional strengthening for lower extremities such as sit to stand and continue step ups 6in; continue progress dynamic balance; potentially trial ambulation with rolling walker    PT Home Exercise Plan  Seated heelraises 15 x 1x/day; LAQ x 15 each leg 1x/day; Hip Abduction/adduction x 15 1x/day; Marching alternating legs x 20 total 1x/day; 11/27/17: Seated hamstring stretch 3x30 seconds each leg    Consulted and Agree with Plan of Care  Patient       Patient will benefit from skilled therapeutic intervention in order to improve the following deficits and impairments:  Abnormal gait, Decreased balance, Decreased endurance, Decreased mobility, Difficulty walking, Hypomobility, Decreased range of motion, Improper body mechanics, Impaired tone, Decreased activity tolerance, Decreased coordination, Decreased strength, Impaired flexibility, Postural dysfunction, Pain  Visit Diagnosis: Weakness of left leg  Muscle weakness (generalized)  Decreased range of motion  Balance problem  Other abnormalities of gait and mobility     Problem List Patient Active Problem List   Diagnosis Date Noted  . Acute respiratory failure with hypoxia ([place])   . Influenza with pneumonia   . Palliative care encounter   . Sepsis ([place]) 09/25/2017  . Fever 09/25/2017  . UTI (urinary tract infection) 09/25/2017  . Abnormal liver function 09/25/2017  . Tachycardia 09/25/2017  . Influenza A   . Pressure ulcer 09/14/2015  . Zenker's diverticulum 09/09/2015  . Bladder cancer ([place]) 07/14/2015  . CAD (coronary artery disease) 07/24/2014  . S/P coronary artery stent placement (RCA bare metal stent on 07/08/14) 07/24/2014  . Exertional angina ([place]) 07/08/2014  . Encounter for therapeutic drug monitoring 09/12/2013  . Atrial fibrillation ([place]) 09/08/2013  . HTN (hypertension) 09/08/2013  . Fatigue 09/08/2013  . Anemia  06/26/2012   Clarene Critchley PT, DPT 1:48 PM, 12/11/17 [place], [place], [address] Phone: [phone]   Fax:  [phone]  Name: Manuel Le MRN: [id number] Date of Birth: Oct 15, 1926

## 2017-12-13 ENCOUNTER — Encounter (HOSPITAL_COMMUNITY): Payer: Self-pay | Admitting: Physical Therapy

## 2017-12-13 ENCOUNTER — Ambulatory Visit (HOSPITAL_COMMUNITY): Payer: Medicare Other | Admitting: Physical Therapy

## 2017-12-13 DIAGNOSIS — R29898 Other symptoms and signs involving the musculoskeletal system: Secondary | ICD-10-CM | POA: Diagnosis not present

## 2017-12-13 DIAGNOSIS — M6281 Muscle weakness (generalized): Secondary | ICD-10-CM

## 2017-12-13 DIAGNOSIS — R2689 Other abnormalities of gait and mobility: Secondary | ICD-10-CM

## 2017-12-13 DIAGNOSIS — M2560 Stiffness of unspecified joint, not elsewhere classified: Secondary | ICD-10-CM

## 2017-12-13 NOTE — Therapy (Signed)
[place], [place], [address] Phone: [phone]   Fax:  [phone]  Physical Therapy Treatment / Re-assessment  Patient Details  Name: DELVONTE BERENSON MRN: [id number] Date of Birth: 1926/12/21 Referring Provider: Dr. Jenny Reichmann "[name]" [name]   Encounter Date: 12/13/2017   Progress Note Reporting Period 11/15/17 to 12/13/17  See note below for Objective Data and Assessment of Progress/Goals.       PT End of Session - 12/13/17 1554    Visit Number  15    Number of Visits  25    Date for PT Re-Evaluation  01/12/18    Authorization Type  Medicare     Authorization Time Period  09/18/17 - 11/16/17; 6/96/29-01/16/40; New cert: 11/12/38 - 08/22/70    Authorization - Visit Number  0    Authorization - Number of Visits  10    PT Start Time  5366    PT Stop Time  1430 Some time unbilled for re-assessment    PT Time Calculation (min)  44 min    Equipment Utilized During Treatment  Gait belt    Activity Tolerance  No increased pain;Patient tolerated treatment well    Behavior During Therapy  [facility] for tasks assessed/performed       Past Medical History:  Diagnosis Date  . Arthritis    OSTEO  . Atrial fibrillation and flutter ([place])   . Bladder cancer ([place])   . Coronary artery disease   . Dysrhythmia   . Exertional angina ([place]) 07/08/2014  . H/O urinary frequency   . Hematuria   . Hypertension   . Iron deficiency anemia   . Kidney stones    hx of   . Mild obstructive sleep apnea    no cpap  . S/P coronary artery stent placement 07/24/2014  . Weakness     Past Surgical History:  Procedure Laterality Date  . CARDIAC CATHETERIZATION    . CARDIOVASCULAR STRESS TEST  12-15-2013  DR Kate Sable ([place])   MILD - MODERATE PERI-INFARCT ISCHEMIA  OF INFERIOR WALL, MID-INFEROSEPTAL WALL, MID-INFEROLATERAL WALL, & BASAL INFERIOR WALL/   NORMAL LVF /  EF 59%/  INTERMEDIATE RISK STUDY  . COLONOSCOPY  07/04/2012    Procedure: COLONOSCOPY;  Surgeon: Rogene Houston, MD;  [facility];  Service: Endoscopy;  Laterality: N/A;  320  . CORONARY STENT PLACEMENT  07/08/2014   PTCA of RCA   DR COOPER  . CYSTOSCOPY W/ RETROGRADES Bilateral 12/31/2013   Procedure: CYSTOSCOPY WITH RETROGRADE PYELOGRAM;  Surgeon: Alexis Frock, MD;  Location: [facility] OR[facility];  Service: Urology;  Laterality: Bilateral;  . CYSTOSCOPY W/ RETROGRADES Bilateral 01/20/2015   Procedure: CYSTOSCOPY WITH RETROGRADE PYELOGRAM;  Surgeon: Alexis Frock, MD;  Location: [facility];  Service: Urology;  Laterality: Bilateral;  . ESOPHAGOGASTRODUODENOSCOPY  07/04/2012   Procedure: ESOPHAGOGASTRODUODENOSCOPY (EGD);  Surgeon: Rogene Houston, MD;  [facility];  Service: Endoscopy;  Laterality: N/A;  . EYE SURGERY     past cataract surgery bilat  . HERNIA REPAIR Right   . LEFT HEART CATHETERIZATION WITH CORONARY ANGIOGRAM N/A 07/08/2014   Procedure: LEFT HEART CATHETERIZATION WITH CORONARY ANGIOGRAM;  Surgeon: Blane Ohara, MD;  Location: [facility] CATH LAB;  Service: Cardiovascular;  Laterality: N/A;  . PERCUTANEOUS CORONARY STENT INTERVENTION (PCI-S)  07/08/2014   Procedure: PERCUTANEOUS CORONARY STENT INTERVENTION (PCI-S);  Surgeon: Blane Ohara, MD;  Location[facility] CATH LAB;  Service: Cardiovascular;;  . TOTAL KNEE ARTHROPLASTY Bilateral  2000  &  2003?  . TRANSTHORACIC ECHOCARDIOGRAM  09-11-2013   MODERATE LVH/  EF 55-60%/  MILD MR & TR/  MILD TO MODERATE CALCIFIED AV WITHOUT STENOSIS/  MILD LAE/   MODERATE PR  . TRANSURETHRAL RESECTION OF BLADDER TUMOR N/A 01/20/2015   Procedure: TRANSURETHRAL RESECTION OF BLADDER TUMOR (TURBT);  Surgeon: Alexis Frock, MD;  [facility];  Service: Urology;  Laterality: N/A;  . TRANSURETHRAL RESECTION OF BLADDER TUMOR WITH GYRUS (TURBT-GYRUS) N/A 12/31/2013   Procedure: TRANSURETHRAL RESECTION OF BLADDER TUMOR WITH GYRUS (TURBT-GYRUS);  Surgeon: Alexis Frock, MD;  [facility];  Service: Urology;   Laterality: N/A;  . ZENKER'S DIVERTICULECTOMY N/A 09/09/2015   Procedure: ENDOSCOPIC ZENKER'S DIVERTICULECTOMY;  Surgeon: Izora Gala, MD;  Location: [place];  Service: ENT;  Laterality: N/A;  . ZENKER'S DIVERTICULECTOMY ENDOSCOPIC  09/09/2015    There were no vitals filed for this visit.  Subjective Assessment - 12/13/17 1550    Subjective  Patient stated he feels he has made some progress with therapy. Patient stated he has been doing his exercises at home everyday he's not in therapy.     Pertinent History  Bladder cancer; Dysphagia; cardiac arrhythmia; hypertension; Atrial fibrillation; bilateral knee replacements    Limitations  Standing;Walking;House hold activities    How long can you sit comfortably?  Not limited    How long can you stand comfortably?  5 minutes; legs give out legs hurt     How long can you walk comfortably?  3 minutes    Diagnostic tests  CT of head on 03/06/17: Spondylosis greatest on left C5-C6; negative for acute intracranial findings    Patient Stated Goals  To improve strength in legs and balance    Currently in Pain?  No/denies    Multiple Pain Sites  No         [facility] PT Assessment - 12/13/17 0001      Assessment   Medical Diagnosis  Weakness in Left Leg, strength and balance training    Referring Provider  Dr. Jenny Reichmann "[name]" [name]    Onset Date/Surgical Date  -- Leg pain and weakness has been going on for several years      [place] residence    Living Arrangements  Spouse/significant other    Type of [place] to enter    Entrance Stairs-Number of Steps  2    [place]  One level    [place] - 2 wheels;Grab bars - tub/shower;Toilet riser      Prior Function   Level of Independence  Independent;Independent with basic ADLs    Vocation  Retired      Charity fundraiser Status  Within Functional Limits for tasks assessed       Observation/Other Assessments   Observations  Forward flexed trunk    Focus on Therapeutic Outcomes (FOTO)   53% (47% limited)      AROM   Right Knee Extension  10    Right Knee Flexion  115    Left Knee Extension  5    Left Knee Flexion  101      Strength   Right Hip Flexion  4/5 was 3/5 then 3+/5    Right Hip Extension  2+/5 was 2+/5    Right Hip ABduction  4+/5 was 4/5    Left Hip  Flexion  4/5 was 3- then 3+    Left Hip Extension  2+/5 was 2+/5    Left Hip ABduction  4+/5 was 4/5    Right Knee Flexion  4+/5 was 4/5 then 4+/5    Right Knee Extension  4+/5 was 4+/5    Left Knee Flexion  4+/5 was 3+/5 then 4+/5    Left Knee Extension  4+/5 was 3+/5 then 4+/5    Right Ankle Dorsiflexion  5/5 was 4+/5    Right Ankle Plantar Flexion  3+/5 was 3+/5    Left Ankle Dorsiflexion  5/5 was 4+/5    Left Ankle Plantar Flexion  3+/5 was 3+/5      Flexibility   Hamstrings  Moderately limited bilaterally with 90/90 hamstring assessment      Ambulation/Gait   Ambulation/Gait  Yes    Ambulation/Gait Assistance  4: Min guard    Ambulation Distance (Feet)  306 Feet 3MWT    Assistive device  Small based quad cane    Gait Pattern  Decreased step length - right;Decreased step length - left;Decreased stance time - right;Decreased stance time - left;Decreased stride length;Trunk flexed;Poor foot clearance - left;Poor foot clearance - right    Ambulation Surface  Level    Gait velocity  0.52 m/s      Timed Up and Go Test   TUG  Normal TUG    Normal TUG (seconds)  20.21    TUG Comments  Patient performed with quad cane but without upper extremities to stand                   [facility] Adult PT Treatment/Exercise - 12/13/17 0001      Knee/Hip Exercises: Supine   Bridges  1 set;15 reps    Other Supine Knee/Hip Exercises  Clamshells with red theraband 1x 15             PT Education - 12/13/17 1552    Education provided  Yes    Education Details  Patient was educated on  findings of re-assessment and plans for patient to continue with physical therapy and educated patient on HEP and tips to prevent falls.     Person(s) Educated  Patient    Methods  Explanation;Handout    Comprehension  Verbalized understanding;Returned demonstration       PT Short Term Goals - 12/13/17 2121      PT SHORT TERM GOAL #1   Title  Patient will demonstrate understanding a report regular compliance with home exercise program.     Baseline  12/13/17: Patient reported that he is performing every day that he is not in therapy    Time  4    Period  Weeks    Status  Achieved      PT SHORT TERM GOAL #2   Title  Patient will demonstrate improved MMT score by 1/2 grade in all deficient planes of lower extremities in order to assist with functional mobility.     Baseline  12/13/17 - some groups improved by 1/2 grade     Time  2    Period  Weeks    Status  Partially Met      PT SHORT TERM GOAL #3   Title  Patient will demonstrate ability to ambulate for 5 minutes without rest break to help patient perform household activities without as many rest breaks.     Baseline 12/13/17: Previous goal not met: Patient will report ability to stand for 10 minutes  in order to assist with improved performance of functional activities at home.  Baseline for new goal: Patient requires rest break after 3 minutes of ambulation   Time  2    Period  Weeks    Status  Revised      PT SHORT TERM GOAL #4   Title  Patient will report pain in legs no greater than 4/10 over the course of a 1 week period indicating improved activity tolerance and improved quality of life.     Baseline  11/15/17: Patient reported no pain currently, but a 5/10 pain in his legs over the last week.     Time  2    Period  Weeks    Status  On-going        PT Long Term Goals - 12/13/17 2130      PT LONG TERM GOAL #1   Title  Patient will demonstrate improved MMT score by 1 grade in all deficient planes of lower extremities in  order to assist with functional mobility.     Baseline  12/13/17: Patient has improved by 1 MMT grade in some but not all muscle groups.     Time  4    Period  Weeks    Status  Partially Met      PT LONG TERM GOAL #2   Title  Patient's FOTO score will improve by 15% indicating patient's improved perceived functional ability and better tolerance of functional activities.     Baseline  12/13/17: Patient's FOTO score improved by 16% from evaluation.     Time  4    Period  Weeks    Status  Achieved      PT LONG TERM GOAL #3   Title  Patient will demonstrate improvement of 10 seconds on TUG score indicating a decreased risk of falls.     Baseline  12/12/17:  Patient performed TUG in 20.21 seconds (was 29.46 seconds and then 20.31 seconds)    Time  4    Period  Weeks    Status  Revised      PT LONG TERM GOAL #4   Title  Patient will demonstrate ability to ambulate an additional 50 feet on the 3MWT indicating an improvement in gait velocity and decreased risk of falls.     Baseline  12/13/17: Patient ambulated 306 feet this session.     Time  4    Period  Weeks    Status  New      PT LONG TERM GOAL #5   Title  Patient will be educated on tips to prevent falls, will be able to recall tips and report having made steps toward improving safety at home.     Baseline  12/13/17: Patient was educated on tips to prevent falls    Time  [place] - 12/13/17 2159    Clinical Impression Statement  This session performed a re-assessment of patient's goals and progress with therapy. Patient has achieved 1 short term goals and has partially met 1 short term goal. One short term goal was revised to challenge patient's walking endurance in the clinic rather than asking patient about standing tolerance. Patient has achieved 1 long term goal and partially met 1 long term goal. The time patient performs TUG was revised on one long term goal to make it more realistic  for  patient. Two additional long term goals were added to improve patient's gait velocity and to improve patient's understanding and application of fall prevention education. The remainder of the session patient was educated about fall safety tips and patient's home exercise program was updated to further target and improve patient's lower extremity strength. Patient's SpO2 was monitored throughout session and found to be at or above 97% throughout session. Overall, patient would benefit from continued skilled physical therapy as patient has demonstrated some improvements in endurance with ambulation, with strength, and with overall functional mobility. However, patient still could benefit from exercises to improve patient's lower extremity strength, balance, gait and overall functional mobility.     Rehab Potential  Fair    Clinical Impairments Affecting Rehab Potential  Positve: Patient's motivation, patient's positive attitude. Negative: Chronicity of issue; frequent falls; medical complexity     PT Frequency  2x / week    PT Duration  4 weeks 4 additional weeks    PT Treatment/Interventions  ADLs/Self Care Home Management;Aquatic Therapy;DME Instruction;Gait training;Stair training;Functional mobility training;Therapeutic activities;Therapeutic exercise;Balance training;Neuromuscular re-education;Patient/family education;Manual techniques;Passive range of motion;Energy conservation    PT Next Visit Plan  Focus on improving lower extremity strength to achieve goals; Follow-up about fall prevention tips and updated HEP; progress patient's ambulation tolerance with goal of achieving 5 minutes without rest breaks; balance exercises and functional lower extremity strengthening    PT Home Exercise Plan  Seated heelraises 15 x 1x/day; LAQ x 15 each leg 1x/day; Hip Abduction/adduction x 15 1x/day; Marching alternating legs x 20 total 1x/day; 11/27/17: Seated hamstring stretch 3x30 seconds each leg. 12/13/17: Supine  bridge 1x 15 1x/day and Clamshells with red theraband 1 x 15 supine 1x/day    Consulted and Agree with Plan of Care  Patient       Patient will benefit from skilled therapeutic intervention in order to improve the following deficits and impairments:  Abnormal gait, Decreased balance, Decreased endurance, Decreased mobility, Difficulty walking, Hypomobility, Decreased range of motion, Improper body mechanics, Impaired tone, Decreased activity tolerance, Decreased coordination, Decreased strength, Impaired flexibility, Postural dysfunction, Pain  Visit Diagnosis: Weakness of left leg  Muscle weakness (generalized)  Decreased range of motion  Balance problem  Other abnormalities of gait and mobility     Problem List Patient Active Problem List   Diagnosis Date Noted  . Acute respiratory failure with hypoxia ([place])   . Influenza with pneumonia   . Palliative care encounter   . Sepsis ([place]) 09/25/2017  . Fever 09/25/2017  . UTI (urinary tract infection) 09/25/2017  . Abnormal liver function 09/25/2017  . Tachycardia 09/25/2017  . Influenza A   . Pressure ulcer 09/14/2015  . Zenker's diverticulum 09/09/2015  . Bladder cancer ([place]) 07/14/2015  . CAD (coronary artery disease) 07/24/2014  . S/P coronary artery stent placement (RCA bare metal stent on 07/08/14) 07/24/2014  . Exertional angina ([place]) 07/08/2014  . Encounter for therapeutic drug monitoring 09/12/2013  . Atrial fibrillation ([place]) 09/08/2013  . HTN (hypertension) 09/08/2013  . Fatigue 09/08/2013  . Anemia 06/26/2012   Clarene Critchley PT, DPT 10:07 PM, 12/13/17 [phone]  [place], [place], [address] Phone: [phone]   Fax:  [phone]  Name: CAIDENCE HIGASHI MRN: [id number] Date of Birth: Oct 23, 1926

## 2017-12-13 NOTE — Patient Instructions (Signed)
  SUPINE HIP ABDUCTION - ELASTIC BAND CLAMS - CLAMSHELL Lie down on your back with your knees bent. Place an elastic band around your knees and then draw your knees apart. Repeat 15 Times Hold 1 Second Complete 1 Set Perform 1 Time(s) a Day   BRIDGING While lying on your back with knees bent, tighten your lower abdominals, squeeze your buttocks and then raise your buttocks off the floor/bed as creating a "Bridge" with your body. Hold and then lower yourself and repeat. Repeat 15 Times Hold 1 Second Complete 1 Set Perform 1 Time(s) a Day

## 2017-12-18 ENCOUNTER — Ambulatory Visit (HOSPITAL_COMMUNITY): Payer: Medicare Other | Admitting: Physical Therapy

## 2017-12-18 ENCOUNTER — Encounter (HOSPITAL_COMMUNITY): Payer: Self-pay | Admitting: Physical Therapy

## 2017-12-18 DIAGNOSIS — R29898 Other symptoms and signs involving the musculoskeletal system: Secondary | ICD-10-CM | POA: Diagnosis not present

## 2017-12-18 DIAGNOSIS — R2689 Other abnormalities of gait and mobility: Secondary | ICD-10-CM

## 2017-12-18 DIAGNOSIS — M6281 Muscle weakness (generalized): Secondary | ICD-10-CM | POA: Diagnosis not present

## 2017-12-18 DIAGNOSIS — M2560 Stiffness of unspecified joint, not elsewhere classified: Secondary | ICD-10-CM

## 2017-12-18 NOTE — Therapy (Signed)
[place], [place], [address] Phone: [phone]   Fax:  [phone]  Physical Therapy Treatment  Patient Details  Name: Manuel Le MRN: [id number] Date of Birth: 05-08-1927 Referring Provider: Dr. Jenny Reichmann "[name]" [name]   Encounter Date: 12/18/2017  PT End of Session - 12/18/17 1453    Visit Number  16    Number of Visits  33    Date for PT Re-Evaluation  01/12/18    Authorization Type  Medicare     Authorization Time Period  09/18/17 - 11/16/17; 6/78/93-03/30/16; New cert: 12/29/23 - 8/52/77    Authorization - Visit Number  1    Authorization - Number of Visits  10    PT Start Time  8242    PT Stop Time  1345    PT Time Calculation (min)  42 min    Equipment Utilized During Treatment  Gait belt    Activity Tolerance  No increased pain;Patient tolerated treatment well    Behavior During Therapy  [facility] for tasks assessed/performed       Past Medical History:  Diagnosis Date  . Arthritis    OSTEO  . Atrial fibrillation and flutter ([place])   . Bladder cancer ([place])   . Coronary artery disease   . Dysrhythmia   . Exertional angina ([place]) 07/08/2014  . H/O urinary frequency   . Hematuria   . Hypertension   . Iron deficiency anemia   . Kidney stones    hx of   . Mild obstructive sleep apnea    no cpap  . S/P coronary artery stent placement 07/24/2014  . Weakness     Past Surgical History:  Procedure Laterality Date  . CARDIAC CATHETERIZATION    . CARDIOVASCULAR STRESS TEST  12-15-2013  DR Kate Sable ([place])   MILD - MODERATE PERI-INFARCT ISCHEMIA  OF INFERIOR WALL, MID-INFEROSEPTAL WALL, MID-INFEROLATERAL WALL, & BASAL INFERIOR WALL/   NORMAL LVF /  EF 59%/  INTERMEDIATE RISK STUDY  . COLONOSCOPY  07/04/2012   Procedure: COLONOSCOPY;  Surgeon: Rogene Houston, MD;  [facility];  Service: Endoscopy;  Laterality: N/A;  320  . CORONARY STENT PLACEMENT  07/08/2014   PTCA of RCA   DR  COOPER  . CYSTOSCOPY W/ RETROGRADES Bilateral 12/31/2013   Procedure: CYSTOSCOPY WITH RETROGRADE PYELOGRAM;  Surgeon: Alexis Frock, MD;  Location: [facility];  Service: Urology;  Laterality: Bilateral;  . CYSTOSCOPY W/ RETROGRADES Bilateral 01/20/2015   Procedure: CYSTOSCOPY WITH RETROGRADE PYELOGRAM;  Surgeon: Alexis Frock, MD;  Location: [facility];  Service: Urology;  Laterality: Bilateral;  . ESOPHAGOGASTRODUODENOSCOPY  07/04/2012   Procedure: ESOPHAGOGASTRODUODENOSCOPY (EGD);  Surgeon: Rogene Houston, MD;  [facility];  Service: Endoscopy;  Laterality: N/A;  . EYE SURGERY     past cataract surgery bilat  . HERNIA REPAIR Right   . LEFT HEART CATHETERIZATION WITH CORONARY ANGIOGRAM N/A 07/08/2014   Procedure: LEFT HEART CATHETERIZATION WITH CORONARY ANGIOGRAM;  Surgeon: Blane Ohara, MD;  [facility] CATH LAB;  Service: Cardiovascular;  Laterality: N/A;  . PERCUTANEOUS CORONARY STENT INTERVENTION (PCI-S)  07/08/2014   Procedure: PERCUTANEOUS CORONARY STENT INTERVENTION (PCI-S);  Surgeon: Blane Ohara, MD;  Location: [facility] CATH LAB;  Service: Cardiovascular;;  . TOTAL KNEE ARTHROPLASTY Bilateral 2000  &  2003?  . TRANSTHORACIC ECHOCARDIOGRAM  09-11-2013   MODERATE LVH/  EF 55-60%/  MILD MR & TR/  MILD TO MODERATE CALCIFIED AV WITHOUT STENOSIS/  MILD LAE/   MODERATE PR  . TRANSURETHRAL RESECTION OF BLADDER TUMOR N/A 01/20/2015   Procedure: TRANSURETHRAL RESECTION OF BLADDER TUMOR (TURBT);  Surgeon: Alexis Frock, MD;  [facility];  Service: Urology;  Laterality: N/A;  . TRANSURETHRAL RESECTION OF BLADDER TUMOR WITH GYRUS (TURBT-GYRUS) N/A 12/31/2013   Procedure: TRANSURETHRAL RESECTION OF BLADDER TUMOR WITH GYRUS (TURBT-GYRUS);  Surgeon: Alexis Frock, MD;  Location: [facility];  Service: Urology;  Laterality: N/A;  . ZENKER'S DIVERTICULECTOMY N/A 09/09/2015   Procedure: ENDOSCOPIC ZENKER'S DIVERTICULECTOMY;  Surgeon: Izora Gala, MD;  Location: [place];  Service: ENT;  Laterality:  N/A;  . ZENKER'S DIVERTICULECTOMY ENDOSCOPIC  09/09/2015    There were no vitals filed for this visit.  Subjective Assessment - 12/18/17 1316    Subjective  Patient denied any pain currently. He stated that he has done his exercises every day that he's not in therapy.     Pertinent History  Bladder cancer; Dysphagia; cardiac arrhythmia; hypertension; Atrial fibrillation; bilateral knee replacements    Limitations  Standing;Walking;House hold activities    How long can you sit comfortably?  Not limited    How long can you stand comfortably?  5 minutes; legs give out legs hurt     How long can you walk comfortably?  3 minutes    Diagnostic tests  CT of head on 03/06/17: Spondylosis greatest on left C5-C6; negative for acute intracranial findings    Patient Stated Goals  To improve strength in legs and balance    Currently in Pain?  No/denies                       [facility] Adult PT Treatment/Exercise - 12/18/17 0001      Ambulation/Gait   Ambulation/Gait  Yes    Ambulation/Gait Assistance  4: Min guard    Ambulation Distance (Feet)  452 Feet    Assistive device  Small based quad cane    Gait Pattern  Decreased step length - right;Decreased step length - left;Decreased stance time - right;Decreased stance time - left;Decreased stride length;Trunk flexed;Poor foot clearance - left;Poor foot clearance - right    Ambulation Surface  Level    Gait Comments  4 minutes      Knee/Hip Exercises: Standing   Heel Raises  Both;10 reps;Other (comment);2 sets with 2# ankle weights     Hip Extension  Stengthening;Right;Left;1 set;10 reps;Other (comment);Knee straight 2# ankle weights      Knee/Hip Exercises: Seated   Sit to Sand  2 sets;without UE support;10 reps;Other (comment) From 22 inch mat with 5# weight      Knee/Hip Exercises: Supine   Bridges  1 set;15 reps    Other Supine Knee/Hip Exercises  Clamshells with red theraband 1x 15    Other Supine Knee/Hip Exercises  Hip  abduction with red theraband 1 x 15 each lower extremity          Balance Exercises - 12/18/17 1319      Balance Exercises: Standing   Step Ups  6 inch;Forward;UE support 1 10 x with 2# ankle weights     Tandem Gait  Forward;Other (comment);4 reps Moderate assistance for balance 14 feet x 4        PT Education - 12/18/17 1317    Education provided  Yes    Education Details  Patient was reminded of fall prevention tips.     Person(s) Educated  Patient    Methods  Explanation    Comprehension  Verbalized understanding       PT Short Term Goals - 12/13/17 2121      PT SHORT TERM GOAL #1   Title  Patient will demonstrate understanding a report regular compliance with home exercise program.     Baseline  12/13/17: Patient reported that he is performing every day that he is not in therapy    Time  4    Period  Weeks    Status  Achieved      PT SHORT TERM GOAL #2   Title  Patient will demonstrate improved MMT score by 1/2 grade in all deficient planes of lower extremities in order to assist with functional mobility.     Baseline  12/13/17 - some groups improved by 1/2 grade     Time  2    Period  Weeks    Status  Partially Met      PT SHORT TERM GOAL #3   Title  Patient will demonstrate ability to ambulate for 5 minutes without rest break to help patient perform household activities without as many rest breaks.      Baseline  12/13/17: 12/13/17: Previous goal not met: Patient will report ability to stand for 10 minutes in order to assist with improved performance of functional activities at home. Baseline for new goal: Patient requires rest break after 3 minutes of ambulation    Time  2    Period  Weeks    Status  Revised      PT SHORT TERM GOAL #4   Title  Patient will report pain in legs no greater than 4/10 over the course of a 1 week period indicating improved activity tolerance and improved quality of life.     Baseline  11/15/17: Patient reported no pain currently, but a  5/10 pain in his legs over the last week.     Time  2    Period  Weeks    Status  On-going        PT Long Term Goals - 12/13/17 2130      PT LONG TERM GOAL #1   Title  Patient will demonstrate improved MMT score by 1 grade in all deficient planes of lower extremities in order to assist with functional mobility.     Baseline  12/13/17: Patient has improved by 1 MMT grade in some but not all muscle groups.     Time  4    Period  Weeks    Status  Partially Met      PT LONG TERM GOAL #2   Title  Patient's FOTO score will improve by 15% indicating patient's improved perceived functional ability and better tolerance of functional activities.     Baseline  12/13/17: Patient's FOTO score improved by 16% from evaluation.     Time  4    Period  Weeks    Status  Achieved      PT LONG TERM GOAL #3   Title  Patient will demonstrate improvement of 10 seconds on TUG score indicating a decreased risk of falls.     Baseline  12/12/17:  Patient performed TUG in 20.21 seconds (was 29.46 seconds and then 20.31 seconds)    Time  4    Period  Weeks    Status  Revised      PT LONG TERM GOAL #4   Title  Patient will demonstrate ability to ambulate an additional 50 feet on the 3MWT indicating an improvement in gait velocity and decreased  risk of falls.     Baseline  12/13/17: Patient ambulated 306 feet this session.     Time  4    Period  Weeks    Status  New      PT LONG TERM GOAL #5   Title  Patient will be educated on tips to prevent falls, will be able to recall tips and report having made steps toward improving safety at home.     Baseline  12/13/17: Patient was educated on tips to prevent falls    Time  4    Period  Weeks    Status  New            Plan - 12/18/17 1502    Clinical Impression Statement  This session began by patient performing ambulation for as long as possible without a rest break, which patient performed for 4 minutes. Therapist provided verbal cues and tactile cues to  improve patient's posture and gait mechanics. Then session focused on improving patient's lower extremity strength. Increased weights and added weights to exercises throughout to improve strengthening with exercises. This session patient performed heel raises in standing with ankle weights, increased weight with sit to stands, and added ankle weights with step-ups. Patient's SpO2 was monitored throughout session and found to be between 92-97%, with therapist providing therapeutic rest break until patient's SpO2 returned to at least 94%. Patient performed exercises added to HEP for a review, and reviewed with patient how to modify his home to reduce the risk of falls. Plan to continue progression of gait training to improve velocity and distance traveled, improve lower extremity strength, and balance.     Rehab Potential  Fair    Clinical Impairments Affecting Rehab Potential  Positve: Patient's motivation, patient's positive attitude. Negative: Chronicity of issue; frequent falls; medical complexity     PT Frequency  2x / week    PT Duration  4 weeks 4 additional weeks    PT Treatment/Interventions  ADLs/Self Care Home Management;Aquatic Therapy;DME Instruction;Gait training;Stair training;Functional mobility training;Therapeutic activities;Therapeutic exercise;Balance training;Neuromuscular re-education;Patient/family education;Manual techniques;Passive range of motion;Energy conservation    PT Next Visit Plan  Focus on improving lower extremity strength to achieve goals; Continue to Follow-up about fall prevention tips and updated HEP; progress patient's ambulation tolerance with goal of achieving 5 minutes without rest breaks; balance exercises and functional lower extremity strengthening    PT Home Exercise Plan  Seated heelraises 15 x 1x/day; LAQ x 15 each leg 1x/day; Hip Abduction/adduction x 15 1x/day; Marching alternating legs x 20 total 1x/day; 11/27/17: Seated hamstring stretch 3x30 seconds each leg.  12/13/17: Supine bridge 1x 15 1x/day and Clamshells with red theraband 1 x 15 supine 1x/day    Consulted and Agree with Plan of Care  Patient       Patient will benefit from skilled therapeutic intervention in order to improve the following deficits and impairments:  Abnormal gait, Decreased balance, Decreased endurance, Decreased mobility, Difficulty walking, Hypomobility, Decreased range of motion, Improper body mechanics, Impaired tone, Decreased activity tolerance, Decreased coordination, Decreased strength, Impaired flexibility, Postural dysfunction, Pain  Visit Diagnosis: Weakness of left leg  Muscle weakness (generalized)  Decreased range of motion  Balance problem  Other abnormalities of gait and mobility     Problem List Patient Active Problem List   Diagnosis Date Noted  . Acute respiratory failure with hypoxia ([place])   . Influenza with pneumonia   . Palliative care encounter   . Sepsis ([place]) 09/25/2017  . Fever 09/25/2017  .  UTI (urinary tract infection) 09/25/2017  . Abnormal liver function 09/25/2017  . Tachycardia 09/25/2017  . Influenza A   . Pressure ulcer 09/14/2015  . Zenker's diverticulum 09/09/2015  . Bladder cancer ([place]) 07/14/2015  . CAD (coronary artery disease) 07/24/2014  . S/P coronary artery stent placement (RCA bare metal stent on 07/08/14) 07/24/2014  . Exertional angina ([place]) 07/08/2014  . Encounter for therapeutic drug monitoring 09/12/2013  . Atrial fibrillation ([place]) 09/08/2013  . HTN (hypertension) 09/08/2013  . Fatigue 09/08/2013  . Anemia 06/26/2012   Clarene Critchley PT, DPT 3:10 PM, 12/18/17 [place], [place], [address] Phone: [phone]   Fax:  [phone]  Name: Manuel Le MRN: [id number] Date of Birth: May 31, 1927

## 2017-12-20 ENCOUNTER — Ambulatory Visit (HOSPITAL_COMMUNITY): Payer: Medicare Other | Attending: Internal Medicine | Admitting: Physical Therapy

## 2017-12-20 DIAGNOSIS — R2689 Other abnormalities of gait and mobility: Secondary | ICD-10-CM | POA: Diagnosis not present

## 2017-12-20 DIAGNOSIS — R29898 Other symptoms and signs involving the musculoskeletal system: Secondary | ICD-10-CM

## 2017-12-20 DIAGNOSIS — M2560 Stiffness of unspecified joint, not elsewhere classified: Secondary | ICD-10-CM | POA: Diagnosis not present

## 2017-12-20 DIAGNOSIS — M6281 Muscle weakness (generalized): Secondary | ICD-10-CM | POA: Diagnosis not present

## 2017-12-20 NOTE — Therapy (Signed)
[place], [place], [address] Phone: [phone]   Fax:  [phone]  Physical Therapy Treatment  Patient Details  Name: Manuel Le MRN: [id number] Date of Birth: 1927/01/09 Referring Provider: Dr. Jenny Reichmann "[name]" [name]   Encounter Date: 12/20/2017  PT End of Session - 12/20/17 1348    Visit Number  17    Number of Visits  33    Date for PT Re-Evaluation  01/12/18    Authorization Type  Medicare     Authorization Time Period  09/18/17 - 11/16/17; 8/81/10-11/02/92; New cert: 5/85/92 - 05/14/45    Authorization - Visit Number  1    Authorization - Number of Visits  10    PT Start Time  1304    PT Stop Time  1350    PT Time Calculation (min)  46 min    Equipment Utilized During Treatment  Gait belt    Activity Tolerance  No increased pain;Patient tolerated treatment well    Behavior During Therapy  [facility] for tasks assessed/performed       Past Medical History:  Diagnosis Date  . Arthritis    OSTEO  . Atrial fibrillation and flutter ([place])   . Bladder cancer ([place])   . Coronary artery disease   . Dysrhythmia   . Exertional angina ([place]) 07/08/2014  . H/O urinary frequency   . Hematuria   . Hypertension   . Iron deficiency anemia   . Kidney stones    hx of   . Mild obstructive sleep apnea    no cpap  . S/P coronary artery stent placement 07/24/2014  . Weakness     Past Surgical History:  Procedure Laterality Date  . CARDIAC CATHETERIZATION    . CARDIOVASCULAR STRESS TEST  12-15-2013  DR Kate Sable ([place])   MILD - MODERATE PERI-INFARCT ISCHEMIA  OF INFERIOR WALL, MID-INFEROSEPTAL WALL, MID-INFEROLATERAL WALL, & BASAL INFERIOR WALL/   NORMAL LVF /  EF 59%/  INTERMEDIATE RISK STUDY  . COLONOSCOPY  07/04/2012   Procedure: COLONOSCOPY;  Surgeon: Rogene Houston, MD;  [facility];  Service: Endoscopy;  Laterality: N/A;  320  . CORONARY STENT PLACEMENT  07/08/2014   PTCA of RCA   DR  COOPER  . CYSTOSCOPY W/ RETROGRADES Bilateral 12/31/2013   Procedure: CYSTOSCOPY WITH RETROGRADE PYELOGRAM;  Surgeon: Alexis Frock, MD;  [facility];  Service: Urology;  Laterality: Bilateral;  . CYSTOSCOPY W/ RETROGRADES Bilateral 01/20/2015   Procedure: CYSTOSCOPY WITH RETROGRADE PYELOGRAM;  Surgeon: Alexis Frock, MD;  [facility];  Service: Urology;  Laterality: Bilateral;  . ESOPHAGOGASTRODUODENOSCOPY  07/04/2012   Procedure: ESOPHAGOGASTRODUODENOSCOPY (EGD);  Surgeon: Rogene Houston, MD;  [facility];  Service: Endoscopy;  Laterality: N/A;  . EYE SURGERY     past cataract surgery bilat  . HERNIA REPAIR Right   . LEFT HEART CATHETERIZATION WITH CORONARY ANGIOGRAM N/A 07/08/2014   Procedure: LEFT HEART CATHETERIZATION WITH CORONARY ANGIOGRAM;  Surgeon: Blane Ohara, MD;  Location: [facility] CATH LAB;  Service: Cardiovascular;  Laterality: N/A;  . PERCUTANEOUS CORONARY STENT INTERVENTION (PCI-S)  07/08/2014   Procedure: PERCUTANEOUS CORONARY STENT INTERVENTION (PCI-S);  Surgeon: Blane Ohara, MD;  Location: [facility] CATH LAB;  Service: Cardiovascular;;  . TOTAL KNEE ARTHROPLASTY Bilateral 2000  &  2003?  . TRANSTHORACIC ECHOCARDIOGRAM  09-11-2013   MODERATE LVH/  EF 55-60%/  MILD MR & TR/  MILD TO MODERATE CALCIFIED AV WITHOUT STENOSIS/  MILD LAE/   MODERATE PR  . TRANSURETHRAL RESECTION OF BLADDER TUMOR N/A 01/20/2015   Procedure: TRANSURETHRAL RESECTION OF BLADDER TUMOR (TURBT);  Surgeon: Alexis Frock, MD;  [facility];  Service: Urology;  Laterality: N/A;  . TRANSURETHRAL RESECTION OF BLADDER TUMOR WITH GYRUS (TURBT-GYRUS) N/A 12/31/2013   Procedure: TRANSURETHRAL RESECTION OF BLADDER TUMOR WITH GYRUS (TURBT-GYRUS);  Surgeon: Alexis Frock, MD;  [facility];  Service: Urology;  Laterality: N/A;  . ZENKER'S DIVERTICULECTOMY N/A 09/09/2015   Procedure: ENDOSCOPIC ZENKER'S DIVERTICULECTOMY;  Surgeon: Izora Gala, MD;  Location: [place];  Service: ENT;  Laterality:  N/A;  . ZENKER'S DIVERTICULECTOMY ENDOSCOPIC  09/09/2015    There were no vitals filed for this visit.  Subjective Assessment - 12/20/17 1413    Subjective  Pt reports no issues or pain.    Currently in Pain?  No/denies                       [facility] Adult PT Treatment/Exercise - 12/20/17 0001      Ambulation/Gait   Ambulation/Gait  Yes    Ambulation/Gait Assistance  4: Min guard    Ambulation Distance (Feet)  452 Feet    Assistive device  Small based quad cane    Gait Pattern  Decreased step length - right;Decreased step length - left;Decreased stance time - right;Decreased stance time - left;Decreased stride length;Trunk flexed;Poor foot clearance - left;Poor foot clearance - right    Ambulation Surface  Level    Gait Comments  4:45"      Knee/Hip Exercises: Standing   Heel Raises  Both;2 sets;15 reps    Hip Extension  Both;2 sets;15 reps;Limitations    Extension Limitations  2# weights    Lateral Step Up  Right;Left;1 set;Step Height: 6";Hand Hold: 1;15 reps    Forward Step Up  Right;Left;10 reps;Hand Hold: 1;Step Height: 4"      Knee/Hip Exercises: Seated   Sit to Sand  2 sets;10 reps;without UE support from 20" height (standard chair) without additional weight      Knee/Hip Exercises: Supine   Bridges  2 sets;15 reps    Other Supine Knee/Hip Exercises  Clamshells with red theraband 2x 15             PT Education - 12/20/17 1413    Education provided  Yes    Education Details  fall prevention tips review/recall    Person(s) Educated  Patient    Methods  Explanation    Comprehension  Verbalized understanding;Need further instruction       PT Short Term Goals - 12/13/17 2121      PT SHORT TERM GOAL #1   Title  Patient will demonstrate understanding a report regular compliance with home exercise program.     Baseline  12/13/17: Patient reported that he is performing every day that he is not in therapy    Time  4    Period  Weeks    Status   Achieved      PT SHORT TERM GOAL #2   Title  Patient will demonstrate improved MMT score by 1/2 grade in all deficient planes of lower extremities in order to assist with functional mobility.     Baseline  12/13/17 - some groups improved by 1/2 grade     Time  2    Period  Weeks    Status  Partially Met      PT SHORT TERM GOAL #3   Title  Patient will demonstrate ability to ambulate for 5 minutes without rest break to help patient perform household activities without as many rest breaks.      Baseline  12/13/17: 12/13/17: Previous goal not met: Patient will report ability to stand for 10 minutes in order to assist with improved performance of functional activities at home. Baseline for new goal: Patient requires rest break after 3 minutes of ambulation    Time  2    Period  Weeks    Status  Revised      PT SHORT TERM GOAL #4   Title  Patient will report pain in legs no greater than 4/10 over the course of a 1 week period indicating improved activity tolerance and improved quality of life.     Baseline  11/15/17: Patient reported no pain currently, but a 5/10 pain in his legs over the last week.     Time  2    Period  Weeks    Status  On-going        PT Long Term Goals - 12/13/17 2130      PT LONG TERM GOAL #1   Title  Patient will demonstrate improved MMT score by 1 grade in all deficient planes of lower extremities in order to assist with functional mobility.     Baseline  12/13/17: Patient has improved by 1 MMT grade in some but not all muscle groups.     Time  4    Period  Weeks    Status  Partially Met      PT LONG TERM GOAL #2   Title  Patient's FOTO score will improve by 15% indicating patient's improved perceived functional ability and better tolerance of functional activities.     Baseline  12/13/17: Patient's FOTO score improved by 16% from evaluation.     Time  4    Period  Weeks    Status  Achieved      PT LONG TERM GOAL #3   Title  Patient will demonstrate improvement  of 10 seconds on TUG score indicating a decreased risk of falls.     Baseline  12/12/17:  Patient performed TUG in 20.21 seconds (was 29.46 seconds and then 20.31 seconds)    Time  4    Period  Weeks    Status  Revised      PT LONG TERM GOAL #4   Title  Patient will demonstrate ability to ambulate an additional 50 feet on the 3MWT indicating an improvement in gait velocity and decreased risk of falls.     Baseline  12/13/17: Patient ambulated 306 feet this session.     Time  4    Period  Weeks    Status  New      PT LONG TERM GOAL #5   Title  Patient will be educated on tips to prevent falls, will be able to recall tips and report having made steps toward improving safety at home.     Baseline  12/13/17: Patient was educated on tips to prevent falls    Time  4    Period  Weeks    Status  New            Plan - 12/20/17 1350    Clinical Impression Statement  Began with ambulation using NBQC with max time of 4:45 feet ,completing 452 feet.  Pt with fatigue at this point requiring a rest break.  Pt required constant cues for posturing and raising LE higher  with gait.  Able to progress to sidestepping on line without AD (no theraband) and began tandem gait using QC.  PT required frequent rests due to fatigue, hwoever SP02 remained between 96-99%.    Able to increase sets of most exercises today as well.  Decreased height of sit to stands to standard chair (20 inches) but ommitted weights.  Pt with noted fatigue from activity with one LOB due to LE weakness.  Reviewed fall prevention tips, hwoever will need reinforment as unable to recall all.      Rehab Potential  Fair    Clinical Impairments Affecting Rehab Potential  Positve: Patient's motivation, patient's positive attitude. Negative: Chronicity of issue; frequent falls; medical complexity     PT Frequency  2x / week    PT Duration  4 weeks 4 additional weeks    PT Treatment/Interventions  ADLs/Self Care Home Management;Aquatic  Therapy;DME Instruction;Gait training;Stair training;Functional mobility training;Therapeutic activities;Therapeutic exercise;Balance training;Neuromuscular re-education;Patient/family education;Manual techniques;Passive range of motion;Energy conservation    PT Next Visit Plan  Focus on improving lower extremity strength to achieve goals; Continue to Follow-up about fall prevention tips and updated HEP; progress patient's ambulation tolerance with goal of achieving 5 minutes without rest breaks; balance exercises and functional lower extremity strengthening    PT Home Exercise Plan  Seated heelraises 15 x 1x/day; LAQ x 15 each leg 1x/day; Hip Abduction/adduction x 15 1x/day; Marching alternating legs x 20 total 1x/day; 11/27/17: Seated hamstring stretch 3x30 seconds each leg. 12/13/17: Supine bridge 1x 15 1x/day and Clamshells with red theraband 1 x 15 supine 1x/day    Consulted and Agree with Plan of Care  Patient       Patient will benefit from skilled therapeutic intervention in order to improve the following deficits and impairments:  Abnormal gait, Decreased balance, Decreased endurance, Decreased mobility, Difficulty walking, Hypomobility, Decreased range of motion, Improper body mechanics, Impaired tone, Decreased activity tolerance, Decreased coordination, Decreased strength, Impaired flexibility, Postural dysfunction, Pain  Visit Diagnosis: Weakness of left leg  Muscle weakness (generalized)  Decreased range of motion  Balance problem  Other abnormalities of gait and mobility     Problem List Patient Active Problem List   Diagnosis Date Noted  . Acute respiratory failure with hypoxia ([place])   . Influenza with pneumonia   . Palliative care encounter   . Sepsis ([place]) 09/25/2017  . Fever 09/25/2017  . UTI (urinary tract infection) 09/25/2017  . Abnormal liver function 09/25/2017  . Tachycardia 09/25/2017  . Influenza A   . Pressure ulcer 09/14/2015  . Zenker's diverticulum  09/09/2015  . Bladder cancer ([place]) 07/14/2015  . CAD (coronary artery disease) 07/24/2014  . S/P coronary artery stent placement (RCA bare metal stent on 07/08/14) 07/24/2014  . Exertional angina ([place]) 07/08/2014  . Encounter for therapeutic drug monitoring 09/12/2013  . Atrial fibrillation ([place]) 09/08/2013  . HTN (hypertension) 09/08/2013  . Fatigue 09/08/2013  . Anemia 06/26/2012   Teena Irani, PTA/CLT [phone]  Teena Irani 12/20/2017, 2:14 PM  [place], [place], [address] Phone: [phone]   Fax:  [phone]  Name: Manuel Le MRN: [id number] Date of Birth: Apr 10, 1927

## 2017-12-24 ENCOUNTER — Ambulatory Visit (HOSPITAL_COMMUNITY): Payer: Medicare Other

## 2017-12-24 DIAGNOSIS — M6281 Muscle weakness (generalized): Secondary | ICD-10-CM | POA: Diagnosis not present

## 2017-12-24 DIAGNOSIS — M2560 Stiffness of unspecified joint, not elsewhere classified: Secondary | ICD-10-CM

## 2017-12-24 DIAGNOSIS — R2689 Other abnormalities of gait and mobility: Secondary | ICD-10-CM | POA: Diagnosis not present

## 2017-12-24 DIAGNOSIS — R29898 Other symptoms and signs involving the musculoskeletal system: Secondary | ICD-10-CM | POA: Diagnosis not present

## 2017-12-24 DIAGNOSIS — I10 Essential (primary) hypertension: Secondary | ICD-10-CM | POA: Diagnosis not present

## 2017-12-24 DIAGNOSIS — R7301 Impaired fasting glucose: Secondary | ICD-10-CM | POA: Diagnosis not present

## 2017-12-24 NOTE — Therapy (Signed)
[place], [place], [address] Phone: [phone]   Fax:  [phone]  Physical Therapy Treatment  Patient Details  Name: Manuel Le MRN: [id number] Date of Birth: 1927/06/15 Referring Provider: Dr. Jenny Reichmann "[name]" [name]   Encounter Date: 12/24/2017  PT End of Session - 12/24/17 1415    Visit Number  18    Number of Visits  33    Date for PT Re-Evaluation  01/12/18    Authorization Type  Medicare     Authorization Time Period  09/18/17 - 11/16/17; 1/32/44-0/10/27; New cert: 2/53/66 - 4/40/34    Authorization - Visit Number  2    Authorization - Number of Visits  10    PT Start Time  7425    PT Stop Time  1433    PT Time Calculation (min)  40 min    Activity Tolerance  Patient tolerated treatment well;Patient limited by fatigue    Behavior During Therapy  [facility] for tasks assessed/performed       Past Medical History:  Diagnosis Date  . Arthritis    OSTEO  . Atrial fibrillation and flutter ([place])   . Bladder cancer ([place])   . Coronary artery disease   . Dysrhythmia   . Exertional angina ([place]) 07/08/2014  . H/O urinary frequency   . Hematuria   . Hypertension   . Iron deficiency anemia   . Kidney stones    hx of   . Mild obstructive sleep apnea    no cpap  . S/P coronary artery stent placement 07/24/2014  . Weakness     Past Surgical History:  Procedure Laterality Date  . CARDIAC CATHETERIZATION    . CARDIOVASCULAR STRESS TEST  12-15-2013  DR Kate Sable ([place])   MILD - MODERATE PERI-INFARCT ISCHEMIA  OF INFERIOR WALL, MID-INFEROSEPTAL WALL, MID-INFEROLATERAL WALL, & BASAL INFERIOR WALL/   NORMAL LVF /  EF 59%/  INTERMEDIATE RISK STUDY  . COLONOSCOPY  07/04/2012   Procedure: COLONOSCOPY;  Surgeon: Rogene Houston, MD;  [facility];  Service: Endoscopy;  Laterality: N/A;  320  . CORONARY STENT PLACEMENT  07/08/2014   PTCA of RCA   DR COOPER  . CYSTOSCOPY W/ RETROGRADES  Bilateral 12/31/2013   Procedure: CYSTOSCOPY WITH RETROGRADE PYELOGRAM;  Surgeon: Alexis Frock, MD;  [facility];  Service: Urology;  Laterality: Bilateral;  . CYSTOSCOPY W/ RETROGRADES Bilateral 01/20/2015   Procedure: CYSTOSCOPY WITH RETROGRADE PYELOGRAM;  Surgeon: Alexis Frock, MD;  [facility];  Service: Urology;  Laterality: Bilateral;  . ESOPHAGOGASTRODUODENOSCOPY  07/04/2012   Procedure: ESOPHAGOGASTRODUODENOSCOPY (EGD);  Surgeon: Rogene Houston, MD;  [facility];  Service: Endoscopy;  Laterality: N/A;  . EYE SURGERY     past cataract surgery bilat  . HERNIA REPAIR Right   . LEFT HEART CATHETERIZATION WITH CORONARY ANGIOGRAM N/A 07/08/2014   Procedure: LEFT HEART CATHETERIZATION WITH CORONARY ANGIOGRAM;  Surgeon: Blane Ohara, MD;  Location: [facility] CATH LAB;  Service: Cardiovascular;  Laterality: N/A;  . PERCUTANEOUS CORONARY STENT INTERVENTION (PCI-S)  07/08/2014   Procedure: PERCUTANEOUS CORONARY STENT INTERVENTION (PCI-S);  Surgeon: Blane Ohara, MD;  Location: [facility] CATH LAB;  Service: Cardiovascular;;  . TOTAL KNEE ARTHROPLASTY Bilateral 2000  &  2003?  . TRANSTHORACIC ECHOCARDIOGRAM  09-11-2013   MODERATE LVH/  EF 55-60%/  MILD MR & TR/  MILD TO MODERATE CALCIFIED AV WITHOUT STENOSIS/  MILD LAE/   MODERATE PR  . TRANSURETHRAL  RESECTION OF BLADDER TUMOR N/A 01/20/2015   Procedure: TRANSURETHRAL RESECTION OF BLADDER TUMOR (TURBT);  Surgeon: Alexis Frock, MD;  [facility];  Service: Urology;  Laterality: N/A;  . TRANSURETHRAL RESECTION OF BLADDER TUMOR WITH GYRUS (TURBT-GYRUS) N/A 12/31/2013   Procedure: TRANSURETHRAL RESECTION OF BLADDER TUMOR WITH GYRUS (TURBT-GYRUS);  Surgeon: Alexis Frock, MD;  [facility];  Service: Urology;  Laterality: N/A;  . ZENKER'S DIVERTICULECTOMY N/A 09/09/2015   Procedure: ENDOSCOPIC ZENKER'S DIVERTICULECTOMY;  Surgeon: Izora Gala, MD;  Location: [place];  Service: ENT;  Laterality: N/A;  . ZENKER'S DIVERTICULECTOMY  ENDOSCOPIC  09/09/2015    There were no vitals filed for this visit.  Subjective Assessment - 12/24/17 1356    Subjective  Pt reports HEP isstill going well. He says  he had a good weekend. He reports feeling sore after last session in his legs, adn that persists today, four days later.     Pertinent History  Bladder cancer; Dysphagia; cardiac arrhythmia; hypertension; Atrial fibrillation; bilateral knee replacements    How long can you sit comfortably?  Not limited    How long can you stand comfortably?  5 minutes; legs give out legs hurt     How long can you walk comfortably?  3 minutes    Diagnostic tests  CT of head on 03/06/17: Spondylosis greatest on left C5-C6; negative for acute intracranial findings    Currently in Pain?  No/denies                       [facility] Adult PT Treatment/Exercise - 12/24/17 1411      Ambulation/Gait   Ambulation Distance (Feet)  226 Feet    Assistive device  Rolling walker    Ambulation Surface  Level    Gait velocity  0.10ms    Gait Comments  Left foot scuffing worse that previous trial      Knee/Hip Exercises: Standing   Hip Extension  Both;5 reps;Knee straight;2 sets Patient on elbows in Room 1, Mod-MaxA for full available ROM    Extension Limitations  minA to maintain safe standing position.       Knee/Hip Exercises: Seated   Other Seated Knee/Hip Exercises  heel raises: 2x20  verbal cues for full ROM; Consider 10-20lbs in lap next sess    Other Seated Knee/Hip Exercises  Ankle dorsiflexion: 2x15 x 3sec holds    Sit to Sand  2 sets;10 reps;without UE support from 20" height (standard chair) without additional weight               PT Short Term Goals - 12/13/17 2121      PT SHORT TERM GOAL #1   Title  Patient will demonstrate understanding a report regular compliance with home exercise program.     Baseline  12/13/17: Patient reported that he is performing every day that he is not in therapy    Time  4    Period   Weeks    Status  Achieved      PT SHORT TERM GOAL #2   Title  Patient will demonstrate improved MMT score by 1/2 grade in all deficient planes of lower extremities in order to assist with functional mobility.     Baseline  12/13/17 - some groups improved by 1/2 grade     Time  2    Period  Weeks    Status  Partially Met      PT SHORT TERM GOAL #3   Title  Patient will demonstrate ability to ambulate for 5 minutes without rest break to help patient perform household activities without as many rest breaks.      Baseline  12/13/17: 12/13/17: Previous goal not met: Patient will report ability to stand for 10 minutes in order to assist with improved performance of functional activities at home. Baseline for new goal: Patient requires rest break after 3 minutes of ambulation    Time  2    Period  Weeks    Status  Revised      PT SHORT TERM GOAL #4   Title  Patient will report pain in legs no greater than 4/10 over the course of a 1 week period indicating improved activity tolerance and improved quality of life.     Baseline  11/15/17: Patient reported no pain currently, but a 5/10 pain in his legs over the last week.     Time  2    Period  Weeks    Status  On-going        PT Long Term Goals - 12/13/17 2130      PT LONG TERM GOAL #1   Title  Patient will demonstrate improved MMT score by 1 grade in all deficient planes of lower extremities in order to assist with functional mobility.     Baseline  12/13/17: Patient has improved by 1 MMT grade in some but not all muscle groups.     Time  4    Period  Weeks    Status  Partially Met      PT LONG TERM GOAL #2   Title  Patient's FOTO score will improve by 15% indicating patient's improved perceived functional ability and better tolerance of functional activities.     Baseline  12/13/17: Patient's FOTO score improved by 16% from evaluation.     Time  4    Period  Weeks    Status  Achieved      PT LONG TERM GOAL #3   Title  Patient will  demonstrate improvement of 10 seconds on TUG score indicating a decreased risk of falls.     Baseline  12/12/17:  Patient performed TUG in 20.21 seconds (was 29.46 seconds and then 20.31 seconds)    Time  4    Period  Weeks    Status  Revised      PT LONG TERM GOAL #4   Title  Patient will demonstrate ability to ambulate an additional 50 feet on the 3MWT indicating an improvement in gait velocity and decreased risk of falls.     Baseline  12/13/17: Patient ambulated 306 feet this session.     Time  4    Period  Weeks    Status  New      PT LONG TERM GOAL #5   Title  Patient will be educated on tips to prevent falls, will be able to recall tips and report having made steps toward improving safety at home.     Baseline  12/13/17: Patient was educated on tips to prevent falls    Time  4    Period  Weeks    Status  New            Plan - 12/24/17 1417    Clinical Impression Statement  Continued to focus on generalized strengthening of BLE to improve both quality, tolerance, and safety of AMB. Noted continued global weakness of hip/knee stabilization during stance and gait cycle, with progressively limited swing phase of bilat  LE Lt/Rt.  Performed a trial of AMB with RW after AMB with SBQC to see if it helped withenergy conservation, but it did not appear to help with speed or mechanics, and may have  resulted in worse forward flexion: may be considered in future, but will need more training. Pt reports no major significant imporvement as of yet.  Consider screening for hip flexor tightness if not already done, as a postential limiter to upright posture and terminal knee extension.     Rehab Potential  Fair    Clinical Impairments Affecting Rehab Potential  Positve: Patient's motivation, patient's positive attitude. Negative: Chronicity of issue; frequent falls; medical complexity     PT Frequency  2x / week    PT Duration  4 weeks    PT Treatment/Interventions  ADLs/Self Care Home  Management;Aquatic Therapy;DME Instruction;Gait training;Stair training;Functional mobility training;Therapeutic activities;Therapeutic exercise;Balance training;Neuromuscular re-education;Patient/family education;Manual techniques;Passive range of motion;Energy conservation    PT Next Visit Plan  continue with lower extremity strength, focus on higher reps adn full range  to achieve goals; Continue to Follow-up about fall prevention tips and updated HEP; progress patient's ambulation tolerance with goal of achieving 5 minutes without rest breaks; balance exercises and functional lower extremity strengthening       Patient will benefit from skilled therapeutic intervention in order to improve the following deficits and impairments:  Abnormal gait, Decreased balance, Decreased endurance, Decreased mobility, Difficulty walking, Hypomobility, Decreased range of motion, Improper body mechanics, Impaired tone, Decreased activity tolerance, Decreased coordination, Decreased strength, Impaired flexibility, Postural dysfunction, Pain  Visit Diagnosis: Weakness of left leg  Muscle weakness (generalized)  Decreased range of motion  Balance problem  Other abnormalities of gait and mobility     Problem List Patient Active Problem List   Diagnosis Date Noted  . Acute respiratory failure with hypoxia ([place])   . Influenza with pneumonia   . Palliative care encounter   . Sepsis ([place]) 09/25/2017  . Fever 09/25/2017  . UTI (urinary tract infection) 09/25/2017  . Abnormal liver function 09/25/2017  . Tachycardia 09/25/2017  . Influenza A   . Pressure ulcer 09/14/2015  . Zenker's diverticulum 09/09/2015  . Bladder cancer ([place]) 07/14/2015  . CAD (coronary artery disease) 07/24/2014  . S/P coronary artery stent placement (RCA bare metal stent on 07/08/14) 07/24/2014  . Exertional angina ([place]) 07/08/2014  . Encounter for therapeutic drug monitoring 09/12/2013  . Atrial fibrillation ([place]) 09/08/2013   . HTN (hypertension) 09/08/2013  . Fatigue 09/08/2013  . Anemia 06/26/2012    2:33 PM, 12/24/17 Etta Grandchild, PT, DPT Physical Therapist at [facility] [phone] (office)      Etta Grandchild 12/24/2017, 2:33 PM  [place], [place], [address] Phone: [phone]   Fax:  [phone]  Name: Manuel Le MRN: [id number] Date of Birth: 09/17/1926

## 2017-12-26 ENCOUNTER — Encounter (HOSPITAL_COMMUNITY): Payer: Self-pay | Admitting: Physical Therapy

## 2017-12-26 ENCOUNTER — Ambulatory Visit (HOSPITAL_COMMUNITY): Payer: Medicare Other | Admitting: Physical Therapy

## 2017-12-26 DIAGNOSIS — R2689 Other abnormalities of gait and mobility: Secondary | ICD-10-CM

## 2017-12-26 DIAGNOSIS — M6281 Muscle weakness (generalized): Secondary | ICD-10-CM | POA: Diagnosis not present

## 2017-12-26 DIAGNOSIS — R29898 Other symptoms and signs involving the musculoskeletal system: Secondary | ICD-10-CM | POA: Diagnosis not present

## 2017-12-26 DIAGNOSIS — M2560 Stiffness of unspecified joint, not elsewhere classified: Secondary | ICD-10-CM | POA: Diagnosis not present

## 2017-12-26 NOTE — Therapy (Addendum)
[place], [place], [address] Phone: [phone]   Fax:  [phone]  Physical Therapy Treatment  Patient Details  Name: Manuel Le MRN: [id number] Date of Birth: Aug 14, 1927 Referring Provider: Dr. Jenny Reichmann "[name]" [name]   Encounter Date: 12/26/2017  PT End of Session - 12/26/17 1435    Visit Number  19    Number of Visits  33    Date for PT Re-Evaluation  01/12/18    Authorization Type  Medicare     Authorization Time Period  09/18/17 - 11/16/17; 6/96/29-01/16/40; New cert: 11/12/38 - 08/22/70    Authorization - Visit Number  3    Authorization - Number of Visits  10    PT Start Time  5366    PT Stop Time  1502    PT Time Calculation (min)  42 min    Equipment Utilized During Treatment  Gait belt    Activity Tolerance  Patient tolerated treatment well;Patient limited by fatigue    Behavior During Therapy  WFL for tasks assessed/performed       Past Medical History:  Diagnosis Date  . Arthritis    OSTEO  . Atrial fibrillation and flutter ([place])   . Bladder cancer ([place])   . Coronary artery disease   . Dysrhythmia   . Exertional angina ([place]) 07/08/2014  . H/O urinary frequency   . Hematuria   . Hypertension   . Iron deficiency anemia   . Kidney stones    hx of   . Mild obstructive sleep apnea    no cpap  . S/P coronary artery stent placement 07/24/2014  . Weakness     Past Surgical History:  Procedure Laterality Date  . CARDIAC CATHETERIZATION    . CARDIOVASCULAR STRESS TEST  12-15-2013  DR Kate Sable ([place])   MILD - MODERATE PERI-INFARCT ISCHEMIA  OF INFERIOR WALL, MID-INFEROSEPTAL WALL, MID-INFEROLATERAL WALL, & BASAL INFERIOR WALL/   NORMAL LVF /  EF 59%/  INTERMEDIATE RISK STUDY  . COLONOSCOPY  07/04/2012   Procedure: COLONOSCOPY;  Surgeon: Rogene Houston, MD;  [facility];  Service: Endoscopy;  Laterality: N/A;  320  . CORONARY STENT PLACEMENT  07/08/2014   PTCA of  RCA   DR COOPER  . CYSTOSCOPY W/ RETROGRADES Bilateral 12/31/2013   Procedure: CYSTOSCOPY WITH RETROGRADE PYELOGRAM;  Surgeon: Alexis Frock, MD;  Location: [facility];  Service: Urology;  Laterality: Bilateral;  . CYSTOSCOPY W/ RETROGRADES Bilateral 01/20/2015   Procedure: CYSTOSCOPY WITH RETROGRADE PYELOGRAM;  Surgeon: Alexis Frock, MD;  Location: [facility];  Service: Urology;  Laterality: Bilateral;  . ESOPHAGOGASTRODUODENOSCOPY  07/04/2012   Procedure: ESOPHAGOGASTRODUODENOSCOPY (EGD);  Surgeon: Rogene Houston, MD;  [facility];  Service: Endoscopy;  Laterality: N/A;  . EYE SURGERY     past cataract surgery bilat  . HERNIA REPAIR Right   . LEFT HEART CATHETERIZATION WITH CORONARY ANGIOGRAM N/A 07/08/2014   Procedure: LEFT HEART CATHETERIZATION WITH CORONARY ANGIOGRAM;  Surgeon: Blane Ohara, MD;  Location: [facility] CATH LAB;  Service: Cardiovascular;  Laterality: N/A;  . PERCUTANEOUS CORONARY STENT INTERVENTION (PCI-S)  07/08/2014   Procedure: PERCUTANEOUS CORONARY STENT INTERVENTION (PCI-S);  Surgeon: Blane Ohara, MD;  Location: [facility] CATH LAB;  Service: Cardiovascular;;  . TOTAL KNEE ARTHROPLASTY Bilateral 2000  &  2003?  . TRANSTHORACIC ECHOCARDIOGRAM  09-11-2013   MODERATE LVH/  EF 55-60%/  MILD MR & TR/  MILD TO MODERATE CALCIFIED AV WITHOUT STENOSIS/  MILD LAE/   MODERATE PR  . TRANSURETHRAL RESECTION OF BLADDER TUMOR N/A 01/20/2015   Procedure: TRANSURETHRAL RESECTION OF BLADDER TUMOR (TURBT);  Surgeon: Alexis Frock, MD;  [facility];  Service: Urology;  Laterality: N/A;  . TRANSURETHRAL RESECTION OF BLADDER TUMOR WITH GYRUS (TURBT-GYRUS) N/A 12/31/2013   Procedure: TRANSURETHRAL RESECTION OF BLADDER TUMOR WITH GYRUS (TURBT-GYRUS);  Surgeon: Alexis Frock, MD;  Location: [facility];  Service: Urology;  Laterality: N/A;  . ZENKER'S DIVERTICULECTOMY N/A 09/09/2015   Procedure: ENDOSCOPIC ZENKER'S DIVERTICULECTOMY;  Surgeon: Izora Gala, MD;  Location: [facility];  Service: ENT;   Laterality: N/A;  . ZENKER'S DIVERTICULECTOMY ENDOSCOPIC  09/09/2015    There were no vitals filed for this visit.  Subjective Assessment - 12/26/17 1431    Subjective  Patient reported he is not having any pain currently. Patient stated that he is still doing his exercises at home. Patient stated he still has not made any modifications to his house to prevent falls.     Pertinent History  Bladder cancer; Dysphagia; cardiac arrhythmia; hypertension; Atrial fibrillation; bilateral knee replacements    How long can you sit comfortably?  Not limited    How long can you stand comfortably?  5 minutes; legs give out legs hurt     How long can you walk comfortably?  3 minutes    Diagnostic tests  CT of head on 03/06/17: Spondylosis greatest on left C5-C6; negative for acute intracranial findings    Currently in Pain?  No/denies                       [facility] Adult PT Treatment/Exercise - 12/26/17 0001      Ambulation/Gait   Ambulation Distance (Feet)  452 Feet in 5 minutes    Assistive device  Rolling walker    Ambulation Surface  Level      Knee/Hip Exercises: Standing   Heel Raises  Both;2 sets;20 reps;Other (comment) 3# ankle weights on each leg    Hip Extension  Both;5 reps;Knee straight;2 sets Patient on elbows (mod-max Assist for full ROM)      Knee/Hip Exercises: Seated   Sit to Sand  2 sets;10 reps;without UE support No UE assistance from standard height chair      Knee/Hip Exercises: Supine   Other Supine Knee/Hip Exercises  Hip abduction with red theraband 2 x 15 each lower extremity          Balance Exercises - 12/26/17 1442      Balance Exercises: Standing   Step Ups  6 inch;Forward;UE support 1 2 x 10 on each lower extremity. x10 with 2# ankle weights    Tandem Gait  Forward;Other (comment);4 reps Semi-tandem 15 feet x 4 with moderate assistance for balance        PT Education - 12/26/17 1434    Education provided  Yes    Education Details   Progression toward goals and purpose and technique with exercises.     Person(s) Educated  Patient    Methods  Explanation    Comprehension  Verbalized understanding;Returned demonstration       PT Short Term Goals - 12/26/17 1511      PT SHORT TERM GOAL #1   Title  Patient will demonstrate understanding a report regular compliance with home exercise program.     Baseline  12/13/17: Patient reported that he is performing every day that he is not in therapy    Time  4  Period  Weeks    Status  Achieved      PT SHORT TERM GOAL #2   Title  Patient will demonstrate improved MMT score by 1/2 grade in all deficient planes of lower extremities in order to assist with functional mobility.     Baseline  12/13/17 - some groups improved by 1/2 grade     Time  2    Period  Weeks    Status  Partially Met      PT SHORT TERM GOAL #3   Title  Patient will demonstrate ability to ambulate for 5 minutes without rest break to help patient perform household activities without as many rest breaks.      Baseline  12/26/17: Patient ambulated for 5 minutes with rolling walker without a rest break.     Time  2    Period  Weeks    Status  Achieved      PT SHORT TERM GOAL #4   Title  Patient will report pain in legs no greater than 4/10 over the course of a 1 week period indicating improved activity tolerance and improved quality of life.     Baseline  11/15/17: Patient reported no pain currently, but a 5/10 pain in his legs over the last week.     Time  2    Period  Weeks    Status  On-going        PT Long Term Goals - 12/13/17 2130      PT LONG TERM GOAL #1   Title  Patient will demonstrate improved MMT score by 1 grade in all deficient planes of lower extremities in order to assist with functional mobility.     Baseline  12/13/17: Patient has improved by 1 MMT grade in some but not all muscle groups.     Time  4    Period  Weeks    Status  Partially Met      PT LONG TERM GOAL #2   Title   Patient's FOTO score will improve by 15% indicating patient's improved perceived functional ability and better tolerance of functional activities.     Baseline  12/13/17: Patient's FOTO score improved by 16% from evaluation.     Time  4    Period  Weeks    Status  Achieved      PT LONG TERM GOAL #3   Title  Patient will demonstrate improvement of 10 seconds on TUG score indicating a decreased risk of falls.     Baseline  12/12/17:  Patient performed TUG in 20.21 seconds (was 29.46 seconds and then 20.31 seconds)    Time  4    Period  Weeks    Status  Revised      PT LONG TERM GOAL #4   Title  Patient will demonstrate ability to ambulate an additional 50 feet on the 3MWT indicating an improvement in gait velocity and decreased risk of falls.     Baseline  12/13/17: Patient ambulated 306 feet this session.     Time  4    Period  Weeks    Status  New      PT LONG TERM GOAL #5   Title  Patient will be educated on tips to prevent falls, will be able to recall tips and report having made steps toward improving safety at home.     Baseline  12/13/17: Patient was educated on tips to prevent falls    Time  4  Period  Weeks    Status  New            Plan - 12/26/17 1509    Clinical Impression Statement  This session continued to progress patient with lower extremity strengthening exercises. This session patient performed standing heel raises with 3# ankle weights on each leg. This session patient also ambulated for 5 consecutive minutes with a rolling walker to achieve one of his goals. Patient continued to require moderate to maximal assistance with prone hip extension exercise this session. Patient would benefit from continued skilled physical therapy to continue progress toward goals.     Rehab Potential  Fair    Clinical Impairments Affecting Rehab Potential  Positve: Patient's motivation, patient's positive attitude. Negative: Chronicity of issue; frequent falls; medical complexity      PT Frequency  2x / week    PT Duration  4 weeks    PT Treatment/Interventions  ADLs/Self Care Home Management;Aquatic Therapy;DME Instruction;Gait training;Stair training;Functional mobility training;Therapeutic activities;Therapeutic exercise;Balance training;Neuromuscular re-education;Patient/family education;Manual techniques;Passive range of motion;Energy conservation    PT Next Visit Plan  Add KX modifier to charges. continue with lower extremity strength, focus on higher reps adn full range  to achieve goals; Continue to Follow-up about fall prevention tips and updated HEP; progress patient's ambulation tolerance with goal of achieving 5 minutes without rest breaks; balance exercises and functional lower extremity strengthening    PT Home Exercise Plan  Seated heelraises 15 x 1x/day; LAQ x 15 each leg 1x/day; Hip Abduction/adduction x 15 1x/day; Marching alternating legs x 20 total 1x/day; 11/27/17: Seated hamstring stretch 3x30 seconds each leg. 12/13/17: Supine bridge 1x 15 1x/day and Clamshells with red theraband 1 x 15 supine 1x/day       Patient will benefit from skilled therapeutic intervention in order to improve the following deficits and impairments:  Abnormal gait, Decreased balance, Decreased endurance, Decreased mobility, Difficulty walking, Hypomobility, Decreased range of motion, Improper body mechanics, Impaired tone, Decreased activity tolerance, Decreased coordination, Decreased strength, Impaired flexibility, Postural dysfunction, Pain  Visit Diagnosis: Weakness of left leg  Muscle weakness (generalized)  Decreased range of motion  Balance problem  Other abnormalities of gait and mobility     Problem List Patient Active Problem List   Diagnosis Date Noted  . Acute respiratory failure with hypoxia ([place])   . Influenza with pneumonia   . Palliative care encounter   . Sepsis ([place]) 09/25/2017  . Fever 09/25/2017  . UTI (urinary tract infection) 09/25/2017  . Abnormal  liver function 09/25/2017  . Tachycardia 09/25/2017  . Influenza A   . Pressure ulcer 09/14/2015  . Zenker's diverticulum 09/09/2015  . Bladder cancer ([place]) 07/14/2015  . CAD (coronary artery disease) 07/24/2014  . S/P coronary artery stent placement (RCA bare metal stent on 07/08/14) 07/24/2014  . Exertional angina ([place]) 07/08/2014  . Encounter for therapeutic drug monitoring 09/12/2013  . Atrial fibrillation ([place]) 09/08/2013  . HTN (hypertension) 09/08/2013  . Fatigue 09/08/2013  . Anemia 06/26/2012   Clarene Critchley PT, DPT 3:57 PM, 12/26/17 [facility] [address] [place], [place], [address] Phone: [phone]   Fax:  [phone]  Name: Manuel Le MRN: [id number] Date of Birth: Oct 05, 1926

## 2017-12-27 DIAGNOSIS — N200 Calculus of kidney: Secondary | ICD-10-CM | POA: Diagnosis not present

## 2017-12-27 DIAGNOSIS — C670 Malignant neoplasm of trigone of bladder: Secondary | ICD-10-CM | POA: Diagnosis not present

## 2017-12-27 DIAGNOSIS — R350 Frequency of micturition: Secondary | ICD-10-CM | POA: Diagnosis not present

## 2017-12-27 DIAGNOSIS — R310 Gross hematuria: Secondary | ICD-10-CM | POA: Diagnosis not present

## 2017-12-28 DIAGNOSIS — C679 Malignant neoplasm of bladder, unspecified: Secondary | ICD-10-CM | POA: Diagnosis not present

## 2017-12-28 DIAGNOSIS — Z0000 Encounter for general adult medical examination without abnormal findings: Secondary | ICD-10-CM | POA: Diagnosis not present

## 2017-12-28 DIAGNOSIS — R5383 Other fatigue: Secondary | ICD-10-CM | POA: Diagnosis not present

## 2017-12-28 DIAGNOSIS — E782 Mixed hyperlipidemia: Secondary | ICD-10-CM | POA: Diagnosis not present

## 2017-12-28 DIAGNOSIS — I2510 Atherosclerotic heart disease of native coronary artery without angina pectoris: Secondary | ICD-10-CM | POA: Diagnosis not present

## 2017-12-28 DIAGNOSIS — I10 Essential (primary) hypertension: Secondary | ICD-10-CM | POA: Diagnosis not present

## 2017-12-28 DIAGNOSIS — Z6826 Body mass index (BMI) 26.0-26.9, adult: Secondary | ICD-10-CM | POA: Diagnosis not present

## 2017-12-28 DIAGNOSIS — M353 Polymyalgia rheumatica: Secondary | ICD-10-CM | POA: Diagnosis not present

## 2017-12-28 DIAGNOSIS — I4891 Unspecified atrial fibrillation: Secondary | ICD-10-CM | POA: Diagnosis not present

## 2017-12-28 DIAGNOSIS — R7301 Impaired fasting glucose: Secondary | ICD-10-CM | POA: Diagnosis not present

## 2017-12-31 ENCOUNTER — Ambulatory Visit (HOSPITAL_COMMUNITY): Payer: Medicare Other

## 2017-12-31 VITALS — BP 165/75 | HR 86

## 2017-12-31 DIAGNOSIS — M6281 Muscle weakness (generalized): Secondary | ICD-10-CM | POA: Diagnosis not present

## 2017-12-31 DIAGNOSIS — M2560 Stiffness of unspecified joint, not elsewhere classified: Secondary | ICD-10-CM

## 2017-12-31 DIAGNOSIS — R2689 Other abnormalities of gait and mobility: Secondary | ICD-10-CM | POA: Diagnosis not present

## 2017-12-31 DIAGNOSIS — R29898 Other symptoms and signs involving the musculoskeletal system: Secondary | ICD-10-CM | POA: Diagnosis not present

## 2017-12-31 NOTE — Therapy (Signed)
[facility] [address] [place], [place], [address] Phone: [phone]   Fax:  [phone]  Physical Therapy Treatment  Patient Details  Name: Manuel Le MRN: [id number] Date of Birth: 03/21/1927 Referring Provider: Dr. John "[name]" Hall   Encounter Date: 12/31/2017  PT End of Session - 12/31/17 1325    Visit Number  20    Number of Visits  33    Date for PT Re-Evaluation  01/12/18    Authorization Type  Medicare     Authorization Time Period  09/18/17 - 11/16/17; 11/15/17-12/13/17; New cert: 12/13/17 - 01/12/18    Authorization - Visit Number  4    Authorization - Number of Visits  10    PT Start Time  1258    PT Stop Time  1338    PT Time Calculation (min)  40 min    Activity Tolerance  Patient tolerated treatment well;Patient limited by fatigue    Behavior During Therapy  WFL for tasks assessed/performed       Past Medical History:  Diagnosis Date  . Arthritis    OSTEO  . Atrial fibrillation and flutter (HCC)   . Bladder cancer (HCC)   . Coronary artery disease   . Dysrhythmia   . Exertional angina (HCC) 07/08/2014  . H/O urinary frequency   . Hematuria   . Hypertension   . Iron deficiency anemia   . Kidney stones    hx of   . Mild obstructive sleep apnea    no cpap  . S/P coronary artery stent placement 07/24/2014  . Weakness     Past Surgical History:  Procedure Laterality Date  . CARDIAC CATHETERIZATION    . CARDIOVASCULAR STRESS TEST  12-15-2013  DR SURESH KONESWARAN ([facility])   MILD - MODERATE PERI-INFARCT ISCHEMIA  OF INFERIOR WALL, MID-INFEROSEPTAL WALL, MID-INFEROLATERAL WALL, & BASAL INFERIOR WALL/   NORMAL LVF /  EF 59%/  INTERMEDIATE RISK STUDY  . COLONOSCOPY  07/04/2012   Procedure: COLONOSCOPY;  Surgeon: Najeeb U Rehman, MD;  Location: [facility];  Service: Endoscopy;  Laterality: N/A;  320  . CORONARY STENT PLACEMENT  07/08/2014   PTCA of RCA   DR COOPER  . CYSTOSCOPY W/ RETROGRADES  Bilateral 12/31/2013   Procedure: CYSTOSCOPY WITH RETROGRADE PYELOGRAM;  Surgeon: Theodore Manny, MD;  Location: [facility];  Service: Urology;  Laterality: Bilateral;  . CYSTOSCOPY W/ RETROGRADES Bilateral 01/20/2015   Procedure: CYSTOSCOPY WITH RETROGRADE PYELOGRAM;  Surgeon: Theodore Manny, MD;  Location: [facility];  Service: Urology;  Laterality: Bilateral;  . ESOPHAGOGASTRODUODENOSCOPY  07/04/2012   Procedure: ESOPHAGOGASTRODUODENOSCOPY (EGD);  Surgeon: Najeeb U Rehman, MD;  Location: [facility];  Service: Endoscopy;  Laterality: N/A;  . EYE SURGERY     past cataract surgery bilat  . HERNIA REPAIR Right   . LEFT HEART CATHETERIZATION WITH CORONARY ANGIOGRAM N/A 07/08/2014   Procedure: LEFT HEART CATHETERIZATION WITH CORONARY ANGIOGRAM;  Surgeon: Michael D Cooper, MD;  Location: [facility];  Service: Cardiovascular;  Laterality: N/A;  . PERCUTANEOUS CORONARY STENT INTERVENTION (PCI-S)  07/08/2014   Procedure: PERCUTANEOUS CORONARY STENT INTERVENTION (PCI-S);  Surgeon: Michael D Cooper, MD;  Location: [facility];  Service: Cardiovascular;;  . TOTAL KNEE ARTHROPLASTY Bilateral 2000  &  2003?  . TRANSTHORACIC ECHOCARDIOGRAM  09-11-2013   MODERATE LVH/  EF 55-60%/  MILD MR & TR/  MILD TO MODERATE CALCIFIED AV WITHOUT STENOSIS/  MILD LAE/   MODERATE PR  . TRANSURETHRAL   RESECTION OF BLADDER TUMOR N/A 01/20/2015   Procedure: TRANSURETHRAL RESECTION OF BLADDER TUMOR (TURBT);  Surgeon: Alexis Frock, MD;  Location: [facility];  Service: Urology;  Laterality: N/A;  . TRANSURETHRAL RESECTION OF BLADDER TUMOR WITH GYRUS (TURBT-GYRUS) N/A 12/31/2013   Procedure: TRANSURETHRAL RESECTION OF BLADDER TUMOR WITH GYRUS (TURBT-GYRUS);  Surgeon: Alexis Frock, MD;  Location: [facility];  Service: Urology;  Laterality: N/A;  . ZENKER'S DIVERTICULECTOMY N/A 09/09/2015   Procedure: ENDOSCOPIC ZENKER'S DIVERTICULECTOMY;  Surgeon: Izora Gala, MD;  Location: [facility];  Service: ENT;  Laterality: N/A;  . ZENKER'S DIVERTICULECTOMY  ENDOSCOPIC  09/09/2015    Vitals:   12/31/17 1336  BP: (!) 165/75  Pulse: 86    Subjective Assessment - 12/31/17 1313    Subjective  Pt reports he fell out of the bed this morning turning in bed to look at th etime. He shows an abrashion, mild excoriation at the right zygomatic arch with some faint dectectable brusining and edema. He otherwise feels fine.     Pertinent History  Bladder cancer; Dysphagia; cardiac arrhythmia; hypertension; Atrial fibrillation; bilateral knee replacements. HPI: Patient reported that about 3 years he was diagnosed with bladder cancer. Since then he has noticed that he doesn't really have much strength in his legs and he stated that he cannot stand up for longer than 5 minutes. Patient reported that his bladder cancer is ongoing and that he is being treated for it. Patient reports he has been falling a lot and stated that he has fallen 7-8 times in the last 6 months. Patient reported that about 6 months ago he had a fall in which he fell and "messed up his glasses", he went to the emergency room and imaging was negative. Patient reported that he lives home with a single story house with a basement with railing and 12 steps. Patient reports that he has had 2 knee replacements; one in each lower extremity approximately 15 years ago. Patient denied any pain in his legs currently, but stated that he got pain in his legs last night which was a 6/10. Patient denied any current night sweats, patient stated that he has urinary urgency since his cancer diagnosis, but patient denied any tingling or numbness currently or any saddle paresthesia.      Currently in Pain?  No/denies                       [facility] Adult PT Treatment/Exercise - 12/31/17 0001      Ambulation/Gait   Ambulation Distance (Feet)  -- 3x249f    Assistive device  Small based quad cane    Ambulation Surface  Level    Gait velocity  0.484m       Knee/Hip Exercises: Machines for Strengthening    Hip Cybex  Hip extnsion machine: 2x12, 1 plate in place of standinghip extension (better ROM c this)       Knee/Hip Exercises: Supine   Short Arc Quad Sets  2 sets;Both 2x12 c 3lb cuff weight    Bridges  2 sets;15 reps    Straight Leg Raises  Both;10 reps;1 set 2x12    Other Supine Knee/Hip Exercises  Bent Knee raise: 2x10 bilat       Manual Therapy   Manual Therapy  Passive ROM    Passive ROM  Supine Thomas Test Hip flexor stretch, manually asssisted: 3x30sec bilat               PT Short Term Goals -  12/26/17 1511      PT SHORT TERM GOAL #1   Title  Patient will demonstrate understanding a report regular compliance with home exercise program.     Baseline  12/13/17: Patient reported that he is performing every day that he is not in therapy    Time  4    Period  Weeks    Status  Achieved      PT SHORT TERM GOAL #2   Title  Patient will demonstrate improved MMT score by 1/2 grade in all deficient planes of lower extremities in order to assist with functional mobility.     Baseline  12/13/17 - some groups improved by 1/2 grade     Time  2    Period  Weeks    Status  Partially Met      PT SHORT TERM GOAL #3   Title  Patient will demonstrate ability to ambulate for 5 minutes without rest break to help patient perform household activities without as many rest breaks.      Baseline  12/26/17: Patient ambulated for 5 minutes with rolling walker without a rest break.     Time  2    Period  Weeks    Status  Achieved      PT SHORT TERM GOAL #4   Title  Patient will report pain in legs no greater than 4/10 over the course of a 1 week period indicating improved activity tolerance and improved quality of life.     Baseline  11/15/17: Patient reported no pain currently, but a 5/10 pain in his legs over the last week.     Time  2    Period  Weeks    Status  On-going        PT Long Term Goals - 12/13/17 2130      PT LONG TERM GOAL #1   Title  Patient will demonstrate improved  MMT score by 1 grade in all deficient planes of lower extremities in order to assist with functional mobility.     Baseline  12/13/17: Patient has improved by 1 MMT grade in some but not all muscle groups.     Time  4    Period  Weeks    Status  Partially Met      PT LONG TERM GOAL #2   Title  Patient's FOTO score will improve by 15% indicating patient's improved perceived functional ability and better tolerance of functional activities.     Baseline  12/13/17: Patient's FOTO score improved by 16% from evaluation.     Time  4    Period  Weeks    Status  Achieved      PT LONG TERM GOAL #3   Title  Patient will demonstrate improvement of 10 seconds on TUG score indicating a decreased risk of falls.     Baseline  12/12/17:  Patient performed TUG in 20.21 seconds (was 29.46 seconds and then 20.31 seconds)    Time  4    Period  Weeks    Status  Revised      PT LONG TERM GOAL #4   Title  Patient will demonstrate ability to ambulate an additional 50 feet on the 3MWT indicating an improvement in gait velocity and decreased risk of falls.     Baseline  12/13/17: Patient ambulated 306 feet this session.     Time  4    Period  Weeks    Status  New  PT LONG TERM GOAL #5   Title  Patient will be educated on tips to prevent falls, will be able to recall tips and report having made steps toward improving safety at home.     Baseline  12/13/17: Patient was educated on tips to prevent falls    Time  4    Period  Weeks    Status  New            Plan - 12/31/17 1326    Clinical Impression Statement  Continue to address weakness in ankles, knees, and hps, as well as ROM restrictions to promote more neutral, energy efficient posture in standing. Terminal knee extension adn terminal hip extension both remain heavily limited in both ROM and strength. Hip extension machine proves more successful in terms of controlled adn full ROM than prior sessions SLS hip extension. ROM is improved after hip  flexor stretching. Gait speed remains slow, somewhat more than a couple weeks ago. Pt does have significant dizziness when transitioning from supine to sitting, which requires 2-3 minutes to resolve prior to moving into standing activity.     Rehab Potential  Fair    Clinical Impairments Affecting Rehab Potential  Positve: Patient's motivation, patient's positive attitude. Negative: Chronicity of issue; frequent falls; medical complexity     PT Frequency  2x / week    PT Duration  4 weeks    PT Treatment/Interventions  ADLs/Self Care Home Management;Aquatic Therapy;DME Instruction;Gait training;Stair training;Functional mobility training;Therapeutic activities;Therapeutic exercise;Balance training;Neuromuscular re-education;Patient/family education;Manual techniques;Passive range of motion;Energy conservation    PT Next Visit Plan  Continue with lower extremity strength, focus on higher reps adn full range  to achieve goals; Continue to Follow-up about fall prevention tips and updated HEP; progress patient's ambulation tolerance with goal of achieving 5 minutes without rest breaks; balance exercises and functional lower extremity strengthening    PT Home Exercise Plan  Seated heelraises 15 x 1x/day; LAQ x 15 each leg 1x/day; Hip Abduction/adduction x 15 1x/day; Marching alternating legs x 20 total 1x/day; 11/27/17: Seated hamstring stretch 3x30 seconds each leg. 12/13/17: Supine bridge 1x 15 1x/day and Clamshells with red theraband 1 x 15 supine 1x/day    Consulted and Agree with Plan of Care  Patient       Patient will benefit from skilled therapeutic intervention in order to improve the following deficits and impairments:  Abnormal gait, Decreased balance, Decreased endurance, Decreased mobility, Difficulty walking, Hypomobility, Decreased range of motion, Improper body mechanics, Impaired tone, Decreased activity tolerance, Decreased coordination, Decreased strength, Impaired flexibility, Postural  dysfunction, Pain  Visit Diagnosis: Weakness of left leg  Muscle weakness (generalized)  Decreased range of motion  Balance problem  Other abnormalities of gait and mobility     Problem List Patient Active Problem List   Diagnosis Date Noted  . Acute respiratory failure with hypoxia ([place])   . Influenza with pneumonia   . Palliative care encounter   . Sepsis ([place]) 09/25/2017  . Fever 09/25/2017  . UTI (urinary tract infection) 09/25/2017  . Abnormal liver function 09/25/2017  . Tachycardia 09/25/2017  . Influenza A   . Pressure ulcer 09/14/2015  . Zenker's diverticulum 09/09/2015  . Bladder cancer ([place]) 07/14/2015  . CAD (coronary artery disease) 07/24/2014  . S/P coronary artery stent placement (RCA bare metal stent on 07/08/14) 07/24/2014  . Exertional angina ([place]) 07/08/2014  . Encounter for therapeutic drug monitoring 09/12/2013  . Atrial fibrillation ([place]) 09/08/2013  . HTN (hypertension) 09/08/2013  . Fatigue 09/08/2013  .  Anemia 06/26/2012    1:42 PM, 12/31/17  C , PT, DPT Physical Therapist - [place] [phone] (Office)    , C 12/31/2017, 1:42 PM  [facility] [address] [place], [place], [address] Phone: [phone]   Fax:  [phone]  Name: Sanjiv E Polansky MRN: [id number] Date of Birth: 02/08/1927   

## 2018-01-01 DIAGNOSIS — L570 Actinic keratosis: Secondary | ICD-10-CM | POA: Diagnosis not present

## 2018-01-01 DIAGNOSIS — I10 Essential (primary) hypertension: Secondary | ICD-10-CM | POA: Diagnosis not present

## 2018-01-01 DIAGNOSIS — R1310 Dysphagia, unspecified: Secondary | ICD-10-CM | POA: Diagnosis not present

## 2018-01-01 DIAGNOSIS — D509 Iron deficiency anemia, unspecified: Secondary | ICD-10-CM | POA: Diagnosis not present

## 2018-01-01 DIAGNOSIS — R05 Cough: Secondary | ICD-10-CM | POA: Diagnosis not present

## 2018-01-01 DIAGNOSIS — M353 Polymyalgia rheumatica: Secondary | ICD-10-CM | POA: Diagnosis not present

## 2018-01-01 DIAGNOSIS — J069 Acute upper respiratory infection, unspecified: Secondary | ICD-10-CM | POA: Diagnosis not present

## 2018-01-01 DIAGNOSIS — I499 Cardiac arrhythmia, unspecified: Secondary | ICD-10-CM | POA: Diagnosis not present

## 2018-01-01 DIAGNOSIS — Z0000 Encounter for general adult medical examination without abnormal findings: Secondary | ICD-10-CM | POA: Diagnosis not present

## 2018-01-01 DIAGNOSIS — C679 Malignant neoplasm of bladder, unspecified: Secondary | ICD-10-CM | POA: Diagnosis not present

## 2018-01-01 DIAGNOSIS — R5382 Chronic fatigue, unspecified: Secondary | ICD-10-CM | POA: Diagnosis not present

## 2018-01-01 DIAGNOSIS — R7301 Impaired fasting glucose: Secondary | ICD-10-CM | POA: Diagnosis not present

## 2018-01-02 ENCOUNTER — Encounter (HOSPITAL_COMMUNITY): Payer: Self-pay | Admitting: Physical Therapy

## 2018-01-02 ENCOUNTER — Ambulatory Visit (HOSPITAL_COMMUNITY): Payer: Medicare Other | Admitting: Physical Therapy

## 2018-01-02 DIAGNOSIS — M2560 Stiffness of unspecified joint, not elsewhere classified: Secondary | ICD-10-CM | POA: Diagnosis not present

## 2018-01-02 DIAGNOSIS — M6281 Muscle weakness (generalized): Secondary | ICD-10-CM | POA: Diagnosis not present

## 2018-01-02 DIAGNOSIS — R2689 Other abnormalities of gait and mobility: Secondary | ICD-10-CM

## 2018-01-02 DIAGNOSIS — R29898 Other symptoms and signs involving the musculoskeletal system: Secondary | ICD-10-CM

## 2018-01-02 NOTE — Therapy (Signed)
[place], [place], [address] Phone: [phone]   Fax:  [phone]  Physical Therapy Treatment  Patient Details  Name: Manuel Le MRN: [id number] Date of Birth: [birth year] Referring Provider: Dr. Jenny Reichmann "[name]" [place]   Encounter Date: 01/02/2018  PT End of Session - 01/02/18 1447    Visit Number  21    Number of Visits  33    Date for PT Re-Evaluation  01/12/18    Authorization Type  Medicare     Authorization Time Period  09/18/17 - 11/16/17; 2/35/57-11/09/00; New cert: 5/42/70 - 02/11/75    Authorization - Visit Number  5    Authorization - Number of Visits  10    PT Start Time  1350    PT Stop Time  1434    PT Time Calculation (min)  44 min    Equipment Utilized During Treatment  Gait belt    Activity Tolerance  Patient tolerated treatment well;Patient limited by fatigue    Behavior During Therapy  [facility] for tasks assessed/performed       Past Medical History:  Diagnosis Date  . Arthritis    OSTEO  . Atrial fibrillation and flutter ([place])   . Bladder cancer ([place])   . Coronary artery disease   . Dysrhythmia   . Exertional angina ([place]) 07/08/2014  . H/O urinary frequency   . Hematuria   . Hypertension   . Iron deficiency anemia   . Kidney stones    hx of   . Mild obstructive sleep apnea    no cpap  . S/P coronary artery stent placement 07/24/2014  . Weakness     Past Surgical History:  Procedure Laterality Date  . CARDIAC CATHETERIZATION    . CARDIOVASCULAR STRESS TEST  12-15-2013  DR Kate Sable ([place])   MILD - MODERATE PERI-INFARCT ISCHEMIA  OF INFERIOR WALL, MID-INFEROSEPTAL WALL, MID-INFEROLATERAL WALL, & BASAL INFERIOR WALL/   NORMAL LVF /  EF 59%/  INTERMEDIATE RISK STUDY  . COLONOSCOPY  07/04/2012   Procedure: COLONOSCOPY;  Surgeon: Rogene Houston, MD;  [facility];  Service: Endoscopy;  Laterality: N/A;  320  . CORONARY STENT PLACEMENT  07/08/2014   PTCA of  RCA   DR COOPER  . CYSTOSCOPY W/ RETROGRADES Bilateral 12/31/2013   Procedure: CYSTOSCOPY WITH RETROGRADE PYELOGRAM;  Surgeon: Alexis Frock, MD;  [facility];  Service: Urology;  Laterality: Bilateral;  . CYSTOSCOPY W/ RETROGRADES Bilateral 01/20/2015   Procedure: CYSTOSCOPY WITH RETROGRADE PYELOGRAM;  Surgeon: Alexis Frock, MD;  [facility];  Service: Urology;  Laterality: Bilateral;  . ESOPHAGOGASTRODUODENOSCOPY  07/04/2012   Procedure: ESOPHAGOGASTRODUODENOSCOPY (EGD);  Surgeon: Rogene Houston, MD;  [facility];  Service: Endoscopy;  Laterality: N/A;  . EYE SURGERY     past cataract surgery bilat  . HERNIA REPAIR Right   . LEFT HEART CATHETERIZATION WITH CORONARY ANGIOGRAM N/A 07/08/2014   Procedure: LEFT HEART CATHETERIZATION WITH CORONARY ANGIOGRAM;  Surgeon: Blane Ohara, MD;  Location: [facility] CATH LAB;  Service: Cardiovascular;  Laterality: N/A;  . PERCUTANEOUS CORONARY STENT INTERVENTION (PCI-S)  07/08/2014   Procedure: PERCUTANEOUS CORONARY STENT INTERVENTION (PCI-S);  Surgeon: Blane Ohara, MD;  Location: [facility] CATH LAB;  Service: Cardiovascular;;  . TOTAL KNEE ARTHROPLASTY Bilateral 2000  &  2003?  . TRANSTHORACIC ECHOCARDIOGRAM  09-11-2013   MODERATE LVH/  EF 55-60%/  MILD MR & TR/  MILD TO MODERATE CALCIFIED AV WITHOUT STENOSIS/  MILD LAE/   MODERATE PR  . TRANSURETHRAL RESECTION OF BLADDER TUMOR N/A 01/20/2015   Procedure: TRANSURETHRAL RESECTION OF BLADDER TUMOR (TURBT);  Surgeon: Alexis Frock, MD;  [facility];  Service: Urology;  Laterality: N/A;  . TRANSURETHRAL RESECTION OF BLADDER TUMOR WITH GYRUS (TURBT-GYRUS) N/A 12/31/2013   Procedure: TRANSURETHRAL RESECTION OF BLADDER TUMOR WITH GYRUS (TURBT-GYRUS);  Surgeon: Alexis Frock, MD;  [facility];  Service: Urology;  Laterality: N/A;  . ZENKER'S DIVERTICULECTOMY N/A 09/09/2015   Procedure: ENDOSCOPIC ZENKER'S DIVERTICULECTOMY;  Surgeon: Izora Gala, MD;  Location: [place];  Service: ENT;   Laterality: N/A;  . ZENKER'S DIVERTICULECTOMY ENDOSCOPIC  09/09/2015    There were no vitals filed for this visit.  Subjective Assessment - 01/02/18 1440    Subjective  Patient stated that he has been feeling better since his fall out of his bed. He stated that his cancer doctor stated he was doing well. Patient also stated that his legs have not been hurting at home as much anymore.     Pertinent History  Bladder cancer; Dysphagia; cardiac arrhythmia; hypertension; Atrial fibrillation; bilateral knee replacements.     Currently in Pain?  No/denies                       [facility] Adult PT Treatment/Exercise - 01/02/18 0001      Ambulation/Gait   Ambulation Distance (Feet)  -- 3 x 226 feet    Assistive device  Small based quad cane    Ambulation Surface  Level    Gait Comments  Therapist provided cueing to increase step length, gait speed, and upright posture.      Knee/Hip Exercises: Machines for Strengthening   Hip Cybex  Hip extension machine: 2x12, 1 plate In place standing hip extension plate 1. 9#s 2 x 12 each LE      Knee/Hip Exercises: Seated   Sit to Sand  2 sets;10 reps;without UE support No upper extremity assistance from standard height chair      Knee/Hip Exercises: Supine   Short Arc Quad Sets  2 sets;Both 2x12 with 3# ankle weight. Over foam roll    Bridges  2 sets;15 reps partial ROM    Straight Leg Raises  Both 2 x 12 each lower extremity    Other Supine Knee/Hip Exercises  Bent Knee raise: 2x10 bilat           Balance Exercises - 01/02/18 1454      Balance Exercises: Standing   Step Ups  6 inch;Forward;UE support 1 2 x 10 with each LE with 2# ankle weights        PT Education - 01/02/18 1446    Education provided  Yes    Education Details  Technique with interventions throughout session.     Person(s) Educated  Patient    Methods  Explanation    Comprehension  Verbalized understanding;Returned demonstration       PT Short Term Goals -  12/26/17 1511      PT SHORT TERM GOAL #1   Title  Patient will demonstrate understanding a report regular compliance with home exercise program.     Baseline  12/13/17: Patient reported that he is performing every day that he is not in therapy    Time  4    Period  Weeks    Status  Achieved      PT SHORT TERM GOAL #2   Title  Patient will demonstrate improved MMT score by  1/2 grade in all deficient planes of lower extremities in order to assist with functional mobility.     Baseline  12/13/17 - some groups improved by 1/2 grade     Time  2    Period  Weeks    Status  Partially Met      PT SHORT TERM GOAL #3   Title  Patient will demonstrate ability to ambulate for 5 minutes without rest break to help patient perform household activities without as many rest breaks.      Baseline  12/26/17: Patient ambulated for 5 minutes with rolling walker without a rest break.     Time  2    Period  Weeks    Status  Achieved      PT SHORT TERM GOAL #4   Title  Patient will report pain in legs no greater than 4/10 over the course of a 1 week period indicating improved activity tolerance and improved quality of life.     Baseline  11/15/17: Patient reported no pain currently, but a 5/10 pain in his legs over the last week.     Time  2    Period  Weeks    Status  On-going        PT Long Term Goals - 12/13/17 2130      PT LONG TERM GOAL #1   Title  Patient will demonstrate improved MMT score by 1 grade in all deficient planes of lower extremities in order to assist with functional mobility.     Baseline  12/13/17: Patient has improved by 1 MMT grade in some but not all muscle groups.     Time  4    Period  Weeks    Status  Partially Met      PT LONG TERM GOAL #2   Title  Patient's FOTO score will improve by 15% indicating patient's improved perceived functional ability and better tolerance of functional activities.     Baseline  12/13/17: Patient's FOTO score improved by 16% from evaluation.      Time  4    Period  Weeks    Status  Achieved      PT LONG TERM GOAL #3   Title  Patient will demonstrate improvement of 10 seconds on TUG score indicating a decreased risk of falls.     Baseline  12/12/17:  Patient performed TUG in 20.21 seconds (was 29.46 seconds and then 20.31 seconds)    Time  4    Period  Weeks    Status  Revised      PT LONG TERM GOAL #4   Title  Patient will demonstrate ability to ambulate an additional 50 feet on the 3MWT indicating an improvement in gait velocity and decreased risk of falls.     Baseline  12/13/17: Patient ambulated 306 feet this session.     Time  4    Period  Weeks    Status  New      PT LONG TERM GOAL #5   Title  Patient will be educated on tips to prevent falls, will be able to recall tips and report having made steps toward improving safety at home.     Baseline  12/13/17: Patient was educated on tips to prevent falls    Time  4    Period  Weeks    Status  New            Plan - 01/02/18 1455    Clinical Impression  Statement  This session continued to address weakness in bilateral ankles, knees, and hips. This session continued to challenge patient with functional strengthening activities by adding resistance such as with step ups with ankle weights during both sets this session. Patient's gait velocity continues to be slow, but patient responded better to cues for upright posture this session with gait training. Patient's SpO2 was monitored throughout session and found to be between 93-97%. Patient would benefit from continued skilled physical therapy to continue addressing deficits in strength, ROM, gait, and overall functional mobility.     Rehab Potential  Fair    Clinical Impairments Affecting Rehab Potential  Positve: Patient's motivation, patient's positive attitude. Negative: Chronicity of issue; frequent falls; medical complexity     PT Frequency  2x / week    PT Duration  4 weeks    PT Treatment/Interventions  ADLs/Self Care  Home Management;Aquatic Therapy;DME Instruction;Gait training;Stair training;Functional mobility training;Therapeutic activities;Therapeutic exercise;Balance training;Neuromuscular re-education;Patient/family education;Manual techniques;Passive range of motion;Energy conservation    PT Next Visit Plan  Continue with lower extremity strength, focus on higher reps adn full range  to achieve goals; Continue to Follow-up about fall prevention tips and updated HEP; progress patient's ambulation tolerance with goal of achieving 5 minutes without rest breaks; balance exercises and functional lower extremity strengthening    PT Home Exercise Plan  Seated heelraises 15 x 1x/day; LAQ x 15 each leg 1x/day; Hip Abduction/adduction x 15 1x/day; Marching alternating legs x 20 total 1x/day; 11/27/17: Seated hamstring stretch 3x30 seconds each leg. 12/13/17: Supine bridge 1x 15 1x/day and Clamshells with red theraband 1 x 15 supine 1x/day    Consulted and Agree with Plan of Care  Patient       Patient will benefit from skilled therapeutic intervention in order to improve the following deficits and impairments:  Abnormal gait, Decreased balance, Decreased endurance, Decreased mobility, Difficulty walking, Hypomobility, Decreased range of motion, Improper body mechanics, Impaired tone, Decreased activity tolerance, Decreased coordination, Decreased strength, Impaired flexibility, Postural dysfunction, Pain  Visit Diagnosis: Weakness of left leg  Muscle weakness (generalized)  Decreased range of motion  Balance problem  Other abnormalities of gait and mobility     Problem List Patient Active Problem List   Diagnosis Date Noted  . Acute respiratory failure with hypoxia ([place])   . Influenza with pneumonia   . Palliative care encounter   . Sepsis ([place]) 09/25/2017  . Fever 09/25/2017  . UTI (urinary tract infection) 09/25/2017  . Abnormal liver function 09/25/2017  . Tachycardia 09/25/2017  . Influenza A   .  Pressure ulcer 09/14/2015  . Zenker's diverticulum 09/09/2015  . Bladder cancer ([place]) 07/14/2015  . CAD (coronary artery disease) 07/24/2014  . S/P coronary artery stent placement (RCA bare metal stent on 07/08/14) 07/24/2014  . Exertional angina ([place]) 07/08/2014  . Encounter for therapeutic drug monitoring 09/12/2013  . Atrial fibrillation ([place]) 09/08/2013  . HTN (hypertension) 09/08/2013  . Fatigue 09/08/2013  . Anemia 06/26/2012   Clarene Critchley PT, DPT 3:04 PM, 01/02/18 [place], [place], [address] Phone: [phone]   Fax:  [phone]  Name: MOUHAMAD TEED MRN: [id number] Date of Birth: 08-27-1926

## 2018-01-07 ENCOUNTER — Ambulatory Visit (HOSPITAL_COMMUNITY): Payer: Medicare Other

## 2018-01-07 DIAGNOSIS — M6281 Muscle weakness (generalized): Secondary | ICD-10-CM | POA: Diagnosis not present

## 2018-01-07 DIAGNOSIS — R29898 Other symptoms and signs involving the musculoskeletal system: Secondary | ICD-10-CM | POA: Diagnosis not present

## 2018-01-07 DIAGNOSIS — M2560 Stiffness of unspecified joint, not elsewhere classified: Secondary | ICD-10-CM

## 2018-01-07 DIAGNOSIS — R2689 Other abnormalities of gait and mobility: Secondary | ICD-10-CM | POA: Diagnosis not present

## 2018-01-07 NOTE — Therapy (Signed)
[place], [place], [address] Phone: [phone]   Fax:  [phone]  Physical Therapy Treatment  Patient Details  Name: Manuel Le MRN: [id number] Date of Birth: 11-04-1926 Referring Provider: Dr. Jenny Reichmann "[name]" [name]   Encounter Date: 01/07/2018  PT End of Session - 01/07/18 1313    Visit Number  22    Number of Visits  33    Date for PT Re-Evaluation  01/12/18    Authorization Type  Medicare     Authorization Time Period  09/18/17 - 11/16/17; 5/63/89-3/73/42; New cert: 8/76/81 - 1/57/26    Authorization - Visit Number  6    Authorization - Number of Visits  10    PT Start Time  1301    PT Stop Time  1339    PT Time Calculation (min)  38 min    Activity Tolerance  Patient tolerated treatment well;Patient limited by fatigue    Behavior During Therapy  [facility] for tasks assessed/performed       Past Medical History:  Diagnosis Date  . Arthritis    OSTEO  . Atrial fibrillation and flutter ([place])   . Bladder cancer ([place])   . Coronary artery disease   . Dysrhythmia   . Exertional angina ([place]) 07/08/2014  . H/O urinary frequency   . Hematuria   . Hypertension   . Iron deficiency anemia   . Kidney stones    hx of   . Mild obstructive sleep apnea    no cpap  . S/P coronary artery stent placement 07/24/2014  . Weakness     Past Surgical History:  Procedure Laterality Date  . CARDIAC CATHETERIZATION    . CARDIOVASCULAR STRESS TEST  12-15-2013  DR Kate Sable ([place])   MILD - MODERATE PERI-INFARCT ISCHEMIA  OF INFERIOR WALL, MID-INFEROSEPTAL WALL, MID-INFEROLATERAL WALL, & BASAL INFERIOR WALL/   NORMAL LVF /  EF 59%/  INTERMEDIATE RISK STUDY  . COLONOSCOPY  07/04/2012   Procedure: COLONOSCOPY;  Surgeon: Rogene Houston, MD;  [facility];  Service: Endoscopy;  Laterality: N/A;  320  . CORONARY STENT PLACEMENT  07/08/2014   PTCA of RCA   DR COOPER  . CYSTOSCOPY W/ RETROGRADES  Bilateral 12/31/2013   Procedure: CYSTOSCOPY WITH RETROGRADE PYELOGRAM;  Surgeon: Alexis Frock, MD;  [facility];  Service: Urology;  Laterality: Bilateral;  . CYSTOSCOPY W/ RETROGRADES Bilateral 01/20/2015   Procedure: CYSTOSCOPY WITH RETROGRADE PYELOGRAM;  Surgeon: Alexis Frock, MD;  [facility];  Service: Urology;  Laterality: Bilateral;  . ESOPHAGOGASTRODUODENOSCOPY  07/04/2012   Procedure: ESOPHAGOGASTRODUODENOSCOPY (EGD);  Surgeon: Rogene Houston, MD;  [facility];  Service: Endoscopy;  Laterality: N/A;  . EYE SURGERY     past cataract surgery bilat  . HERNIA REPAIR Right   . LEFT HEART CATHETERIZATION WITH CORONARY ANGIOGRAM N/A 07/08/2014   Procedure: LEFT HEART CATHETERIZATION WITH CORONARY ANGIOGRAM;  Surgeon: Blane Ohara, MD;  Location: [facility] CATH LAB;  Service: Cardiovascular;  Laterality: N/A;  . PERCUTANEOUS CORONARY STENT INTERVENTION (PCI-S)  07/08/2014   Procedure: PERCUTANEOUS CORONARY STENT INTERVENTION (PCI-S);  Surgeon: Blane Ohara, MD;  Location: [facility] CATH LAB;  Service: Cardiovascular;;  . TOTAL KNEE ARTHROPLASTY Bilateral 2000  &  2003?  . TRANSTHORACIC ECHOCARDIOGRAM  09-11-2013   MODERATE LVH/  EF 55-60%/  MILD MR & TR/  MILD TO MODERATE CALCIFIED AV WITHOUT STENOSIS/  MILD LAE/   MODERATE PR  . TRANSURETHRAL  RESECTION OF BLADDER TUMOR N/A 01/20/2015   Procedure: TRANSURETHRAL RESECTION OF BLADDER TUMOR (TURBT);  Surgeon: Alexis Frock, MD;  [facility];  Service: Urology;  Laterality: N/A;  . TRANSURETHRAL RESECTION OF BLADDER TUMOR WITH GYRUS (TURBT-GYRUS) N/A 12/31/2013   Procedure: TRANSURETHRAL RESECTION OF BLADDER TUMOR WITH GYRUS (TURBT-GYRUS);  Surgeon: Alexis Frock, MD;  [facility];  Service: Urology;  Laterality: N/A;  . ZENKER'S DIVERTICULECTOMY N/A 09/09/2015   Procedure: ENDOSCOPIC ZENKER'S DIVERTICULECTOMY;  Surgeon: Izora Gala, MD;  Location: [place];  Service: ENT;  Laterality: N/A;  . ZENKER'S DIVERTICULECTOMY  ENDOSCOPIC  09/09/2015    There were no vitals filed for this visit.  Subjective Assessment - 01/07/18 1308    Subjective  P toing well, stayedin all weekened. reports that his legfs still feel tired despite 9 hours sleep.     Pertinent History  Bladder cancer; Dysphagia; cardiac arrhythmia; hypertension; Atrial fibrillation; bilateral knee replacements.     Currently in Pain?  No/denies                       [facility] Adult PT Treatment/Exercise - 01/07/18 0001      Ambulation/Gait   Ambulation Distance (Feet)  -- 3 x 226 feet    Assistive device  Rolling walker    Ambulation Surface  Level    Gait velocity  0.3ms    Gait Comments  Verbal cues to pick up feet, avoid scuff pt needs 2-3 minutes rest  between efforts      Knee/Hip Exercises: Aerobic   Nustep  Level 1x4 minutes (after end of session, not billed)      Knee/Hip Exercises: Machines for Strengthening   Hip Cybex  Hip extension machine: 2x12, 1 plate In place standing hip extension plate 1. 9#s 2 x 12 each LE      Knee/Hip Exercises: Standing   Lateral Step Up  2 sets;Hand Hold: 2;Step Height: 4" 2RT up/down stair case (4' steps x7) 2x bilat    Other Standing Knee Exercises  Sidestepping in parallel bars [id number]      Knee/Hip Exercises: Seated   Sit to Sand  2 sets;without UE support 2x10, chair+airex; VC for straight knees, tall posture               PT Short Term Goals - 12/26/17 1511      PT SHORT TERM GOAL #1   Title  Patient will demonstrate understanding a report regular compliance with home exercise program.     Baseline  12/13/17: Patient reported that he is performing every day that he is not in therapy    Time  4    Period  Weeks    Status  Achieved      PT SHORT TERM GOAL #2   Title  Patient will demonstrate improved MMT score by 1/2 grade in all deficient planes of lower extremities in order to assist with functional mobility.     Baseline  12/13/17 - some groups improved by  1/2 grade     Time  2    Period  Weeks    Status  Partially Met      PT SHORT TERM GOAL #3   Title  Patient will demonstrate ability to ambulate for 5 minutes without rest break to help patient perform household activities without as many rest breaks.      Baseline  12/26/17: Patient ambulated for 5 minutes with rolling walker without a rest break.  Time  2    Period  Weeks    Status  Achieved      PT SHORT TERM GOAL #4   Title  Patient will report pain in legs no greater than 4/10 over the course of a 1 week period indicating improved activity tolerance and improved quality of life.     Baseline  11/15/17: Patient reported no pain currently, but a 5/10 pain in his legs over the last week.     Time  2    Period  Weeks    Status  On-going        PT Long Term Goals - 12/13/17 2130      PT LONG TERM GOAL #1   Title  Patient will demonstrate improved MMT score by 1 grade in all deficient planes of lower extremities in order to assist with functional mobility.     Baseline  12/13/17: Patient has improved by 1 MMT grade in some but not all muscle groups.     Time  4    Period  Weeks    Status  Partially Met      PT LONG TERM GOAL #2   Title  Patient's FOTO score will improve by 15% indicating patient's improved perceived functional ability and better tolerance of functional activities.     Baseline  12/13/17: Patient's FOTO score improved by 16% from evaluation.     Time  4    Period  Weeks    Status  Achieved      PT LONG TERM GOAL #3   Title  Patient will demonstrate improvement of 10 seconds on TUG score indicating a decreased risk of falls.     Baseline  12/12/17:  Patient performed TUG in 20.21 seconds (was 29.46 seconds and then 20.31 seconds)    Time  4    Period  Weeks    Status  Revised      PT LONG TERM GOAL #4   Title  Patient will demonstrate ability to ambulate an additional 50 feet on the 3MWT indicating an improvement in gait velocity and decreased risk of falls.      Baseline  12/13/17: Patient ambulated 306 feet this session.     Time  4    Period  Weeks    Status  New      PT LONG TERM GOAL #5   Title  Patient will be educated on tips to prevent falls, will be able to recall tips and report having made steps toward improving safety at home.     Baseline  12/13/17: Patient was educated on tips to prevent falls    Time  4    Period  Weeks    Status  New            Plan - 01/07/18 1314    Clinical Impression Statement  AMB remains largely limited to similar distances over th epast couple weeks, with no significant improvement in gait speed. Cont focus on progressing tolerance to activity, with constant education on factors that can improve safety adn gait quality. Progress limited more recently.  Pt requires frequent rest breaks between efforts due to BLE fatigue.     Rehab Potential  Fair    Clinical Impairments Affecting Rehab Potential  Positve: Patient's motivation, patient's positive attitude. Negative: Chronicity of issue; frequent falls; medical complexity     PT Frequency  2x / week    PT Duration  4 weeks    PT Treatment/Interventions  ADLs/Self Care Home Management;Aquatic Therapy;DME Instruction;Gait training;Stair training;Functional mobility training;Therapeutic activities;Therapeutic exercise;Balance training;Neuromuscular re-education;Patient/family education;Manual techniques;Passive range of motion;Energy conservation    PT Next Visit Plan  Continue with lower extremity strength, focus on higher reps adn full range  to achieve goals; Continue to Follow-up about fall prevention tips and updated HEP; progress patient's ambulation tolerance with goal of achieving 5 minutes without rest breaks; balance exercises and functional lower extremity strengthening    PT Home Exercise Plan  Seated heelraises 15 x 1x/day; LAQ x 15 each leg 1x/day; Hip Abduction/adduction x 15 1x/day; Marching alternating legs x 20 total 1x/day; 11/27/17: Seated  hamstring stretch 3x30 seconds each leg. 12/13/17: Supine bridge 1x 15 1x/day and Clamshells with red theraband 1 x 15 supine 1x/day    Consulted and Agree with Plan of Care  Patient       Patient will benefit from skilled therapeutic intervention in order to improve the following deficits and impairments:  Abnormal gait, Decreased balance, Decreased endurance, Decreased mobility, Difficulty walking, Hypomobility, Decreased range of motion, Improper body mechanics, Impaired tone, Decreased activity tolerance, Decreased coordination, Decreased strength, Impaired flexibility, Postural dysfunction, Pain  Visit Diagnosis: Weakness of left leg  Muscle weakness (generalized)  Decreased range of motion  Balance problem  Other abnormalities of gait and mobility     Problem List Patient Active Problem List   Diagnosis Date Noted  . Acute respiratory failure with hypoxia ([place])   . Influenza with pneumonia   . Palliative care encounter   . Sepsis ([place]) 09/25/2017  . Fever 09/25/2017  . UTI (urinary tract infection) 09/25/2017  . Abnormal liver function 09/25/2017  . Tachycardia 09/25/2017  . Influenza A   . Pressure ulcer 09/14/2015  . Zenker's diverticulum 09/09/2015  . Bladder cancer ([place]) 07/14/2015  . CAD (coronary artery disease) 07/24/2014  . S/P coronary artery stent placement (RCA bare metal stent on 07/08/14) 07/24/2014  . Exertional angina ([place]) 07/08/2014  . Encounter for therapeutic drug monitoring 09/12/2013  . Atrial fibrillation ([place]) 09/08/2013  . HTN (hypertension) 09/08/2013  . Fatigue 09/08/2013  . Anemia 06/26/2012   1:38 PM, 01/07/18 Etta Grandchild, PT, DPT Physical Therapist at [place] [phone] (office)     Etta Grandchild 01/07/2018, 1:38 PM  [place], [place], [address] Phone: [phone]   Fax:  [phone]  Name: Manuel Le MRN:  [id number] Date of Birth: 11-22-1926

## 2018-01-09 ENCOUNTER — Ambulatory Visit (HOSPITAL_COMMUNITY): Payer: Medicare Other | Admitting: Physical Therapy

## 2018-01-09 ENCOUNTER — Encounter (HOSPITAL_COMMUNITY): Payer: Self-pay | Admitting: Physical Therapy

## 2018-01-09 DIAGNOSIS — R2689 Other abnormalities of gait and mobility: Secondary | ICD-10-CM | POA: Diagnosis not present

## 2018-01-09 DIAGNOSIS — M2560 Stiffness of unspecified joint, not elsewhere classified: Secondary | ICD-10-CM

## 2018-01-09 DIAGNOSIS — M6281 Muscle weakness (generalized): Secondary | ICD-10-CM

## 2018-01-09 DIAGNOSIS — R29898 Other symptoms and signs involving the musculoskeletal system: Secondary | ICD-10-CM

## 2018-01-09 NOTE — Patient Instructions (Signed)
  HIP EXTENSION - STANDING While standing, balance on one leg and move your other leg in a backward direction. Do not swing the leg. Perform smooth and controlled movements. Keep your trunk stable and without arching during the movement. Use your arms for support if needed for balance and safety. Perform on each leg. Repeat 10 Times Hold 1 Second Complete 2 Sets Perform 3 Time(s) a Week   TANDEM STANCE WITH SUPPORT Stand in front of a chair, table or counter top for support. Then place the heel of one foot so that it is touching the toes of the other foot. Maintain your balance in this position. Perform on both legs.  Repeat 3 Times Hold 30 Seconds Complete 1 Set Perform 3 Time(s) a Week

## 2018-01-09 NOTE — Therapy (Signed)
[facility] [address] [place], [place], [address] Phone: [phone]   Fax:  [phone]  Physical Therapy Treatment / Re-assessment / Discharge Summary  Patient Details  Name: Manuel Le MRN: [id number] Date of Birth: Jun 13, 1927 Referring Provider: Dr. Jenny Reichmann "[name]" [place]   Progress Note Reporting Period 12/13/17 to 01/09/18  See note below for Objective Data and Assessment of Progress/Goals.       Encounter Date: 01/09/2018  PT End of Session - 01/09/18 1707    Visit Number  23    Number of Visits  33    Date for PT Re-Evaluation  01/12/18    Authorization Type  Medicare     Authorization Time Period  09/18/17 - 11/16/17; 2/95/18-8/41/66; New cert: 0/63/01 - 01/20/08    Authorization - Visit Number  7    Authorization - Number of Visits  10    PT Start Time  3235    PT Stop Time  5732 Some time unbilled for re-assessment    PT Time Calculation (min)  37 min    Equipment Utilized During Treatment  Gait belt    Activity Tolerance  Patient tolerated treatment well;No increased pain    Behavior During Therapy  WFL for tasks assessed/performed       Past Medical History:  Diagnosis Date  . Arthritis    OSTEO  . Atrial fibrillation and flutter ([place])   . Bladder cancer ([place])   . Coronary artery disease   . Dysrhythmia   . Exertional angina ([place]) 07/08/2014  . H/O urinary frequency   . Hematuria   . Hypertension   . Iron deficiency anemia   . Kidney stones    hx of   . Mild obstructive sleep apnea    no cpap  . S/P coronary artery stent placement 07/24/2014  . Weakness     Past Surgical History:  Procedure Laterality Date  . CARDIAC CATHETERIZATION    . CARDIOVASCULAR STRESS TEST  12-15-2013  DR Kate Sable ([place])   MILD - MODERATE PERI-INFARCT ISCHEMIA  OF INFERIOR WALL, MID-INFEROSEPTAL WALL, MID-INFEROLATERAL WALL, & BASAL INFERIOR WALL/   NORMAL LVF /  EF 59%/  INTERMEDIATE RISK STUDY  .  COLONOSCOPY  07/04/2012   Procedure: COLONOSCOPY;  Surgeon: Rogene Houston, MD;  [facility];  Service: Endoscopy;  Laterality: N/A;  320  . CORONARY STENT PLACEMENT  07/08/2014   PTCA of RCA   DR COOPER  . CYSTOSCOPY W/ RETROGRADES Bilateral 12/31/2013   Procedure: CYSTOSCOPY WITH RETROGRADE PYELOGRAM;  Surgeon: Alexis Frock, MD;  Location: [facility];  Service: Urology;  Laterality: Bilateral;  . CYSTOSCOPY W/ RETROGRADES Bilateral 01/20/2015   Procedure: CYSTOSCOPY WITH RETROGRADE PYELOGRAM;  Surgeon: Alexis Frock, MD;  Location: [facility];  Service: Urology;  Laterality: Bilateral;  . ESOPHAGOGASTRODUODENOSCOPY  07/04/2012   Procedure: ESOPHAGOGASTRODUODENOSCOPY (EGD);  Surgeon: Rogene Houston, MD;  [facility];  Service: Endoscopy;  Laterality: N/A;  . EYE SURGERY     past cataract surgery bilat  . HERNIA REPAIR Right   . LEFT HEART CATHETERIZATION WITH CORONARY ANGIOGRAM N/A 07/08/2014   Procedure: LEFT HEART CATHETERIZATION WITH CORONARY ANGIOGRAM;  Surgeon: Blane Ohara, MD;  Location: [facility] CATH LAB;  Service: Cardiovascular;  Laterality: N/A;  . PERCUTANEOUS CORONARY STENT INTERVENTION (PCI-S)  07/08/2014   Procedure: PERCUTANEOUS CORONARY STENT INTERVENTION (PCI-S);  Surgeon: Blane Ohara, MD;  Location: [facility] CATH LAB;  Service: Cardiovascular;;  . TOTAL KNEE  ARTHROPLASTY Bilateral 2000  &  2003?  . TRANSTHORACIC ECHOCARDIOGRAM  09-11-2013   MODERATE LVH/  EF 55-60%/  MILD MR & TR/  MILD TO MODERATE CALCIFIED AV WITHOUT STENOSIS/  MILD LAE/   MODERATE PR  . TRANSURETHRAL RESECTION OF BLADDER TUMOR N/A 01/20/2015   Procedure: TRANSURETHRAL RESECTION OF BLADDER TUMOR (TURBT);  Surgeon: Alexis Frock, MD;  Location[facility];  Service: Urology;  Laterality: N/A;  . TRANSURETHRAL RESECTION OF BLADDER TUMOR WITH GYRUS (TURBT-GYRUS) N/A 12/31/2013   Procedure: TRANSURETHRAL RESECTION OF BLADDER TUMOR WITH GYRUS (TURBT-GYRUS);  Surgeon: Alexis Frock, MD;  Location: [facility];  Service: Urology;  Laterality: N/A;  . ZENKER'S DIVERTICULECTOMY N/A 09/09/2015   Procedure: ENDOSCOPIC ZENKER'S DIVERTICULECTOMY;  Surgeon: Izora Gala, MD;  Location: [facility];  Service: ENT;  Laterality: N/A;  . ZENKER'S DIVERTICULECTOMY ENDOSCOPIC  09/09/2015    There were no vitals filed for this visit.  Subjective Assessment - 01/09/18 1701    Subjective  Patient reported that he is not having any pain today and reported that he feels like he has made good progress with therapy and that he is happy with his functional level. Patient reported that he remembers the tips for fall safety and that he has already moved the slippery rugs from his house. Patient reported no greater than a 3/10 pain in his legs over a 1 week period.     Pertinent History  Bladder cancer; Dysphagia; cardiac arrhythmia; hypertension; Atrial fibrillation; bilateral knee replacements.     How long can you sit comfortably?  Not limited    Currently in Pain?  No/denies         [facility] PT Assessment - 01/09/18 0001      Assessment   Medical Diagnosis  Weakness in Left Leg, strength and balance training    Referring Provider  Dr. Jenny Reichmann "[name]" [place]  Private residence    Living Arrangements  Spouse/significant other    Type of [place] to enter    Entrance Stairs-Number of Steps  2    Entrance Stairs-Rails  None    Home Layout  One level    [place] - quad;Walker - 2 wheels;Grab bars - tub/shower;Toilet riser      Prior Function   Level of Independence  Independent;Independent with basic ADLs    Vocation  Retired      Charity fundraiser Status  Within Functional Limits for tasks assessed      Observation/Other Assessments   Focus on Therapeutic Outcomes (FOTO)   53% (47% limited)      Posture/Postural Control   Posture/Postural Control  Postural limitations    Postural Limitations  Anterior pelvic tilt;Flexed  trunk      Strength   Right Hip Flexion  4+/5 was 3/5 then 3+/5 then 4/5    Right Hip Extension  3/5 was 2+/5    Right Hip ABduction  4+/5 was 4/5 then 4+/5    Left Hip Flexion  4+/5 was 3- then 3+ then 4/5    Left Hip Extension  3+/5 was 2+/5    Left Hip ABduction  4+/5 was 4/5 then 4+/5    Right Knee Flexion  5/5 was 4/5 then 4+/5    Right Knee Extension  5/5 was 4+/5    Left Knee Flexion  5/5 was 3+/5 then 4+/5    Left Knee  Extension  5/5 was 3+/5 then 4+/5    Right Ankle Dorsiflexion  5/5 was 4+/5    Right Ankle Plantar Flexion  4/5 was 3+/5    Left Ankle Dorsiflexion  5/5 was 4+/5    Left Ankle Plantar Flexion  4/5 was 3+/5      Flexibility   Hamstrings  Moderately limited bilaterally with 90/90 hamstring assessment      Ambulation/Gait   Ambulation/Gait  Yes    Ambulation Distance (Feet)  358 Feet    Assistive device  Rolling walker    Gait Pattern  Decreased step length - right;Decreased step length - left;Decreased stance time - right;Decreased stance time - left;Decreased stride length;Trunk flexed Patient still had forward flexed trunk, but was decreased    Ambulation Surface  Level    Gait velocity  0.61 m/s      Timed Up and Go Test   TUG  Normal TUG    Normal TUG (seconds)  18.13    TUG Comments  From standard height chair with quad cane Was 31 then 29.46 then 20.31 then 20.21      RLE Tone   RLE Tone  Within Functional Limits      LLE Tone   LLE Tone  Within Functional Limits                           PT Education - 01/09/18 1706    Education provided  Yes    Education Details  Patient was educated on examination findings, about an updated HEP and about continuing exercises at home as well as about the senior center and about going to the gym to continue exercising. Therapist instructed patient to use his rolling walker for ambulation for increased safety.     Person(s) Educated  Patient    Methods  Explanation;Demonstration;Handout     Comprehension  Verbalized understanding       PT Short Term Goals - 01/09/18 1708      PT SHORT TERM GOAL #1   Title  Patient will demonstrate understanding a report regular compliance with home exercise program.     Baseline  01/09/18: Patient reported that he is performing exercises daily.     Time  4    Period  Weeks    Status  Achieved      PT SHORT TERM GOAL #2   Title  Patient will demonstrate improved MMT score by 1/2 grade in all deficient planes of lower extremities in order to assist with functional mobility.     Baseline  01/09/18: All muscle groups tested as deficient at evaluation improved by 1/2 MMT grade (see objective measures).     Time  2    Period  Weeks    Status  Achieved      PT SHORT TERM GOAL #3   Title  Patient will demonstrate ability to ambulate for 5 minutes without rest break to help patient perform household activities without as many rest breaks.      Baseline  12/26/17: Patient ambulated for 5 minutes with rolling walker without a rest break.     Time  2    Period  Weeks    Status  Achieved      PT SHORT TERM GOAL #4   Title  Patient will report pain in legs no greater than 4/10 over the course of a 1 week period indicating improved activity tolerance and improved quality of life.  Baseline  01/09/18: Patient reported his leg pain had been a 3/10 over a 1 week period.     Time  2    Period  Weeks    Status  Achieved        PT Long Term Goals - 01/09/18 1711      PT LONG TERM GOAL #1   Title  Patient will demonstrate improved MMT score by 1 grade in all deficient planes of lower extremities in order to assist with functional mobility.     Baseline  01/09/18: Patient improved by 1 MMT grade in some but not all muscle groups (see objective measures).     Time  4    Period  Weeks    Status  Partially Met      PT LONG TERM GOAL #2   Title  Patient's FOTO score will improve by 15% indicating patient's improved perceived functional ability and  better tolerance of functional activities.     Baseline  01/09/18: Patient's FOTO score improved by 16% from evaluation.     Time  4    Period  Weeks    Status  Achieved      PT LONG TERM GOAL #3   Title  Patient will demonstrate improvement of 10 seconds on TUG score indicating a decreased risk of falls.     Baseline  01/09/18:  Patient performed TUG in 18.13 seconds which was at least a 10 second improvement from evaluation.     Time  4    Period  Weeks    Status  Achieved      PT LONG TERM GOAL #4   Title  Patient will demonstrate ability to ambulate an additional 50 feet on the 3MWT indicating an improvement in gait velocity and decreased risk of falls.     Baseline  01/09/18: Patient ambulated 358 feet this session which was more than a 50 foot increase from the last re-assessment of 306 feet.    Time  4    Period  Weeks    Status  Achieved      PT LONG TERM GOAL #5   Title  Patient will be educated on tips to prevent falls, will be able to recall tips and report having made steps toward improving safety at home.     Baseline  01/09/18: Patient was able to recall some tips about preventing falls and reported that he has removed rugs from home.     Time  4    Period  Weeks    Status  Achieved            Plan - 01/09/18 1718    Clinical Impression Statement  This session performed a re-assessment of patient's progress toward goals. Patient achieved all 4 of his short term goals. Patient achieved 4 out of 5 of his long term goals and partially met 1 out of 5 of his long term goals. Patient demonstrated improvement in strength and balance at this session, although patient did not improve by 1 MMT grade in all deficient muscle groups, therapist provided patient with exercises to continue to target areas of weakness as well as exercise for patient to continue to maintain balance. Patient was provided information about the local senior center and about joining a gym. Patient was also  given information on how to contact the clinic if he had any further questions or concerns. Patient is being discharged at this time as he has met the majority of his goals  and because he is pleased with his current functional status.     Rehab Potential  Fair    Clinical Impairments Affecting Rehab Potential  Positve: Patient's motivation, patient's positive attitude. Negative: Chronicity of issue; frequent falls; medical complexity     PT Frequency  2x / week    PT Duration  4 weeks    PT Treatment/Interventions  ADLs/Self Care Home Management;Aquatic Therapy;DME Instruction;Gait training;Stair training;Functional mobility training;Therapeutic activities;Therapeutic exercise;Balance training;Neuromuscular re-education;Patient/family education;Manual techniques;Passive range of motion;Energy conservation    PT Next Visit Plan  Patient is being discharged at this time    PT Home Exercise Plan  Seated heelraises 15 x 1x/day; LAQ x 15 each leg 1x/day; Hip Abduction/adduction x 15 1x/day; Marching alternating legs x 20 total 1x/day; 11/27/17: Seated hamstring stretch 3x30 seconds each leg. 12/13/17: Supine bridge 1x 15 1x/day and Clamshells with red theraband 1 x 15 supine 1x/day; 01/09/18: Tandem stance at support 3 x 30 seconds each lower extremity and standing hip extension at support surface 2 x 10 repetitions each leg and 3x/week     Consulted and Agree with Plan of Care  Patient       Patient will benefit from skilled therapeutic intervention in order to improve the following deficits and impairments:  Abnormal gait, Decreased balance, Decreased endurance, Decreased mobility, Difficulty walking, Hypomobility, Decreased range of motion, Improper body mechanics, Impaired tone, Decreased activity tolerance, Decreased coordination, Decreased strength, Impaired flexibility, Postural dysfunction, Pain  Visit Diagnosis: Weakness of left leg  Muscle weakness (generalized)  Decreased range of  motion  Balance problem  Other abnormalities of gait and mobility     Problem List Patient Active Problem List   Diagnosis Date Noted  . Acute respiratory failure with hypoxia ([place])   . Influenza with pneumonia   . Palliative care encounter   . Sepsis ([place]) 09/25/2017  . Fever 09/25/2017  . UTI (urinary tract infection) 09/25/2017  . Abnormal liver function 09/25/2017  . Tachycardia 09/25/2017  . Influenza A   . Pressure ulcer 09/14/2015  . Zenker's diverticulum 09/09/2015  . Bladder cancer ([place]) 07/14/2015  . CAD (coronary artery disease) 07/24/2014  . S/P coronary artery stent placement (RCA bare metal stent on 07/08/14) 07/24/2014  . Exertional angina ([place]) 07/08/2014  . Encounter for therapeutic drug monitoring 09/12/2013  . Atrial fibrillation ([place]) 09/08/2013  . HTN (hypertension) 09/08/2013  . Fatigue 09/08/2013  . Anemia 06/26/2012   PHYSICAL THERAPY DISCHARGE SUMMARY  Visits from Start of Care: 23  Current functional level related to goals / functional outcomes: See above   Remaining deficits: See above   Education / Equipment: Patient was educated on an updated HEP continuing previous HEP, joining the senior center and a [employer], as well as how to contact the clinic with any further questions or concerns.  Plan: Patient agrees to discharge.  Patient goals were partially met. Patient is being discharged due to being pleased with the current functional level.  ????? As well as patient meeting the majority of his goals.                 Clarene Critchley PT, DPT 5:23 PM, 01/09/18 [place], [place], [address] Phone: [phone]   Fax:  [phone]  Name: LYNDA CAPISTRAN MRN: [id number] Date of Birth: May 09, 1927

## 2018-01-15 ENCOUNTER — Encounter (HOSPITAL_COMMUNITY): Payer: Medicare Other | Admitting: Physical Therapy

## 2018-01-17 ENCOUNTER — Encounter (HOSPITAL_COMMUNITY): Payer: Medicare Other | Admitting: Physical Therapy

## 2018-01-31 DIAGNOSIS — J069 Acute upper respiratory infection, unspecified: Secondary | ICD-10-CM | POA: Diagnosis not present

## 2018-01-31 DIAGNOSIS — I10 Essential (primary) hypertension: Secondary | ICD-10-CM | POA: Diagnosis not present

## 2018-01-31 DIAGNOSIS — M353 Polymyalgia rheumatica: Secondary | ICD-10-CM | POA: Diagnosis not present

## 2018-01-31 DIAGNOSIS — Z0000 Encounter for general adult medical examination without abnormal findings: Secondary | ICD-10-CM | POA: Diagnosis not present

## 2018-01-31 DIAGNOSIS — D509 Iron deficiency anemia, unspecified: Secondary | ICD-10-CM | POA: Diagnosis not present

## 2018-01-31 DIAGNOSIS — I499 Cardiac arrhythmia, unspecified: Secondary | ICD-10-CM | POA: Diagnosis not present

## 2018-01-31 DIAGNOSIS — R05 Cough: Secondary | ICD-10-CM | POA: Diagnosis not present

## 2018-01-31 DIAGNOSIS — L570 Actinic keratosis: Secondary | ICD-10-CM | POA: Diagnosis not present

## 2018-01-31 DIAGNOSIS — R7301 Impaired fasting glucose: Secondary | ICD-10-CM | POA: Diagnosis not present

## 2018-01-31 DIAGNOSIS — C679 Malignant neoplasm of bladder, unspecified: Secondary | ICD-10-CM | POA: Diagnosis not present

## 2018-01-31 DIAGNOSIS — R1310 Dysphagia, unspecified: Secondary | ICD-10-CM | POA: Diagnosis not present

## 2018-01-31 DIAGNOSIS — R5382 Chronic fatigue, unspecified: Secondary | ICD-10-CM | POA: Diagnosis not present

## 2018-03-04 DIAGNOSIS — R7301 Impaired fasting glucose: Secondary | ICD-10-CM | POA: Diagnosis not present

## 2018-03-04 DIAGNOSIS — M353 Polymyalgia rheumatica: Secondary | ICD-10-CM | POA: Diagnosis not present

## 2018-03-04 DIAGNOSIS — D519 Vitamin B12 deficiency anemia, unspecified: Secondary | ICD-10-CM | POA: Diagnosis not present

## 2018-03-04 DIAGNOSIS — Z0000 Encounter for general adult medical examination without abnormal findings: Secondary | ICD-10-CM | POA: Diagnosis not present

## 2018-03-04 DIAGNOSIS — D509 Iron deficiency anemia, unspecified: Secondary | ICD-10-CM | POA: Diagnosis not present

## 2018-03-04 DIAGNOSIS — J069 Acute upper respiratory infection, unspecified: Secondary | ICD-10-CM | POA: Diagnosis not present

## 2018-03-04 DIAGNOSIS — R1310 Dysphagia, unspecified: Secondary | ICD-10-CM | POA: Diagnosis not present

## 2018-03-04 DIAGNOSIS — I499 Cardiac arrhythmia, unspecified: Secondary | ICD-10-CM | POA: Diagnosis not present

## 2018-03-04 DIAGNOSIS — C679 Malignant neoplasm of bladder, unspecified: Secondary | ICD-10-CM | POA: Diagnosis not present

## 2018-03-04 DIAGNOSIS — L570 Actinic keratosis: Secondary | ICD-10-CM | POA: Diagnosis not present

## 2018-03-04 DIAGNOSIS — R5382 Chronic fatigue, unspecified: Secondary | ICD-10-CM | POA: Diagnosis not present

## 2018-03-04 DIAGNOSIS — R05 Cough: Secondary | ICD-10-CM | POA: Diagnosis not present

## 2018-04-03 DIAGNOSIS — M353 Polymyalgia rheumatica: Secondary | ICD-10-CM | POA: Diagnosis not present

## 2018-04-03 DIAGNOSIS — R05 Cough: Secondary | ICD-10-CM | POA: Diagnosis not present

## 2018-04-03 DIAGNOSIS — R5382 Chronic fatigue, unspecified: Secondary | ICD-10-CM | POA: Diagnosis not present

## 2018-04-03 DIAGNOSIS — D519 Vitamin B12 deficiency anemia, unspecified: Secondary | ICD-10-CM | POA: Diagnosis not present

## 2018-04-03 DIAGNOSIS — D509 Iron deficiency anemia, unspecified: Secondary | ICD-10-CM | POA: Diagnosis not present

## 2018-04-03 DIAGNOSIS — L570 Actinic keratosis: Secondary | ICD-10-CM | POA: Diagnosis not present

## 2018-04-03 DIAGNOSIS — I499 Cardiac arrhythmia, unspecified: Secondary | ICD-10-CM | POA: Diagnosis not present

## 2018-04-03 DIAGNOSIS — Z0000 Encounter for general adult medical examination without abnormal findings: Secondary | ICD-10-CM | POA: Diagnosis not present

## 2018-04-03 DIAGNOSIS — R1310 Dysphagia, unspecified: Secondary | ICD-10-CM | POA: Diagnosis not present

## 2018-04-03 DIAGNOSIS — C679 Malignant neoplasm of bladder, unspecified: Secondary | ICD-10-CM | POA: Diagnosis not present

## 2018-04-03 DIAGNOSIS — J069 Acute upper respiratory infection, unspecified: Secondary | ICD-10-CM | POA: Diagnosis not present

## 2018-04-03 DIAGNOSIS — R7301 Impaired fasting glucose: Secondary | ICD-10-CM | POA: Diagnosis not present

## 2018-05-03 DIAGNOSIS — D519 Vitamin B12 deficiency anemia, unspecified: Secondary | ICD-10-CM | POA: Diagnosis not present

## 2018-05-09 DIAGNOSIS — J069 Acute upper respiratory infection, unspecified: Secondary | ICD-10-CM | POA: Diagnosis not present

## 2018-05-09 DIAGNOSIS — R5382 Chronic fatigue, unspecified: Secondary | ICD-10-CM | POA: Diagnosis not present

## 2018-05-09 DIAGNOSIS — I482 Chronic atrial fibrillation: Secondary | ICD-10-CM | POA: Diagnosis not present

## 2018-05-09 DIAGNOSIS — R7301 Impaired fasting glucose: Secondary | ICD-10-CM | POA: Diagnosis not present

## 2018-05-09 DIAGNOSIS — I10 Essential (primary) hypertension: Secondary | ICD-10-CM | POA: Diagnosis not present

## 2018-05-09 DIAGNOSIS — M353 Polymyalgia rheumatica: Secondary | ICD-10-CM | POA: Diagnosis not present

## 2018-05-09 DIAGNOSIS — C679 Malignant neoplasm of bladder, unspecified: Secondary | ICD-10-CM | POA: Diagnosis not present

## 2018-05-09 DIAGNOSIS — R1310 Dysphagia, unspecified: Secondary | ICD-10-CM | POA: Diagnosis not present

## 2018-05-09 DIAGNOSIS — Z8551 Personal history of malignant neoplasm of bladder: Secondary | ICD-10-CM | POA: Diagnosis not present

## 2018-05-09 DIAGNOSIS — I499 Cardiac arrhythmia, unspecified: Secondary | ICD-10-CM | POA: Diagnosis not present

## 2018-05-09 DIAGNOSIS — D509 Iron deficiency anemia, unspecified: Secondary | ICD-10-CM | POA: Diagnosis not present

## 2018-05-09 DIAGNOSIS — R05 Cough: Secondary | ICD-10-CM | POA: Diagnosis not present

## 2018-05-13 DIAGNOSIS — R7301 Impaired fasting glucose: Secondary | ICD-10-CM | POA: Diagnosis not present

## 2018-05-13 DIAGNOSIS — M353 Polymyalgia rheumatica: Secondary | ICD-10-CM | POA: Diagnosis not present

## 2018-05-13 DIAGNOSIS — I10 Essential (primary) hypertension: Secondary | ICD-10-CM | POA: Diagnosis not present

## 2018-05-13 DIAGNOSIS — C679 Malignant neoplasm of bladder, unspecified: Secondary | ICD-10-CM | POA: Diagnosis not present

## 2018-05-13 DIAGNOSIS — Z6827 Body mass index (BMI) 27.0-27.9, adult: Secondary | ICD-10-CM | POA: Diagnosis not present

## 2018-05-13 DIAGNOSIS — I2510 Atherosclerotic heart disease of native coronary artery without angina pectoris: Secondary | ICD-10-CM | POA: Diagnosis not present

## 2018-05-13 DIAGNOSIS — M6281 Muscle weakness (generalized): Secondary | ICD-10-CM | POA: Diagnosis not present

## 2018-06-03 DIAGNOSIS — D519 Vitamin B12 deficiency anemia, unspecified: Secondary | ICD-10-CM | POA: Diagnosis not present

## 2018-06-13 DIAGNOSIS — Z23 Encounter for immunization: Secondary | ICD-10-CM | POA: Diagnosis not present

## 2018-07-01 DIAGNOSIS — C670 Malignant neoplasm of trigone of bladder: Secondary | ICD-10-CM | POA: Diagnosis not present

## 2018-07-01 DIAGNOSIS — R350 Frequency of micturition: Secondary | ICD-10-CM | POA: Diagnosis not present

## 2018-07-04 DIAGNOSIS — D519 Vitamin B12 deficiency anemia, unspecified: Secondary | ICD-10-CM | POA: Diagnosis not present

## 2018-07-23 ENCOUNTER — Ambulatory Visit: Payer: Medicare Other | Admitting: Cardiovascular Disease

## 2018-07-24 DIAGNOSIS — J060 Acute laryngopharyngitis: Secondary | ICD-10-CM | POA: Diagnosis not present

## 2018-07-24 DIAGNOSIS — Z1159 Encounter for screening for other viral diseases: Secondary | ICD-10-CM | POA: Diagnosis not present

## 2018-08-05 DIAGNOSIS — D519 Vitamin B12 deficiency anemia, unspecified: Secondary | ICD-10-CM | POA: Diagnosis not present

## 2018-08-10 DIAGNOSIS — J060 Acute laryngopharyngitis: Secondary | ICD-10-CM | POA: Diagnosis not present

## 2018-08-10 DIAGNOSIS — J209 Acute bronchitis, unspecified: Secondary | ICD-10-CM | POA: Diagnosis not present

## 2018-09-09 ENCOUNTER — Encounter: Payer: Self-pay | Admitting: Cardiovascular Disease

## 2018-09-09 ENCOUNTER — Ambulatory Visit (INDEPENDENT_AMBULATORY_CARE_PROVIDER_SITE_OTHER): Payer: Medicare Other | Admitting: Cardiovascular Disease

## 2018-09-09 ENCOUNTER — Encounter: Payer: Self-pay | Admitting: *Deleted

## 2018-09-09 ENCOUNTER — Telehealth: Payer: Self-pay | Admitting: Cardiovascular Disease

## 2018-09-09 VITALS — BP 160/52 | HR 72 | Ht 72.0 in | Wt 201.0 lb

## 2018-09-09 DIAGNOSIS — I10 Essential (primary) hypertension: Secondary | ICD-10-CM

## 2018-09-09 DIAGNOSIS — I480 Paroxysmal atrial fibrillation: Secondary | ICD-10-CM | POA: Diagnosis not present

## 2018-09-09 DIAGNOSIS — Z955 Presence of coronary angioplasty implant and graft: Secondary | ICD-10-CM | POA: Diagnosis not present

## 2018-09-09 DIAGNOSIS — I350 Nonrheumatic aortic (valve) stenosis: Secondary | ICD-10-CM

## 2018-09-09 DIAGNOSIS — I208 Other forms of angina pectoris: Secondary | ICD-10-CM | POA: Diagnosis not present

## 2018-09-09 DIAGNOSIS — R5383 Other fatigue: Secondary | ICD-10-CM | POA: Diagnosis not present

## 2018-09-09 DIAGNOSIS — I25118 Atherosclerotic heart disease of native coronary artery with other forms of angina pectoris: Secondary | ICD-10-CM

## 2018-09-09 DIAGNOSIS — C671 Malignant neoplasm of dome of bladder: Secondary | ICD-10-CM | POA: Diagnosis not present

## 2018-09-09 MED ORDER — ASPIRIN 81 MG PO TABS: 81.0000 mg | ORAL_TABLET | Freq: Every day | ORAL | Status: DC

## 2018-09-09 NOTE — Patient Instructions (Addendum)
Medication Instructions:  Decrease Aspirin to 81mg  dailyl. Continue all other current medications.  Labwork: none  Testing/Procedures:  Your physician has requested that you have a lexiscan myoview. For further information please visit [email or web address]. Please follow instruction sheet, as given.  Office will contact with results via phone or letter.    Follow-Up: 3 months   Any Other Special Instructions Will Be Listed Below (If Applicable).  If you need a refill on your cardiac medications before your next appointment, please call your pharmacy.

## 2018-09-09 NOTE — Progress Notes (Signed)
SUBJECTIVE: The patient presents for routine follow-up. He underwent bare-metal stent placement to the ostium of the RCA on 07/08/14.  He also has a history of atrial fibrillation and flutter. He underwent transurethral resection for bladder tumor recurrence on 01/20/15.   Echocardiogram 09/26/2017 showed normal left ventricular systolic function and regional wall motion, LVEF 55 to 60%, mild LVH, moderately calcified aortic annulus and moderately calcified leaflets with probable mild aortic stenosis, and moderate left atrial dilatation.  He denies chest pain and shortness of breath but said he has no strength.  He walks with a walker.  He underwent an abnormal nuclear stress test in April 2015 but cardiac catheterization could not be pursued at that time due to anemia, hematuria, and complications from bladder cancer.    Review of Systems: As per "subjective", otherwise negative.  No Known Allergies  Current Outpatient Medications  Medication Sig Dispense Refill  . amLODipine (NORVASC) 5 MG tablet Take 5 mg by mouth daily.    Marland Kitchen amoxicillin-clavulanate (AUGMENTIN) 875-125 MG tablet Take 1 tablet by mouth 2 (two) times daily.    Marland Kitchen aspirin 325 MG tablet 1 tablet (325 mg total) by Per NG tube route daily. (Patient taking differently: Take 325 mg by mouth daily. ) 30 tablet 0  . carvedilol (COREG) 3.125 MG tablet 1 tablet (3.125 mg total) by Per NG tube route 2 (two) times daily with a meal. (Patient taking differently: Take 3.125 mg by mouth 2 (two) times daily with a meal. ) 60 tablet 0  . Cholecalciferol (VITAMIN D-3) 1000 units CAPS 1 capsule (1,000 Units total) by Per NG tube route daily. (Patient taking differently: Take 1 capsule by mouth daily. ) 60 capsule 0  . ferrous sulfate 325 (65 FE) MG EC tablet Take 325 mg by mouth daily with breakfast.    . finasteride (PROSCAR) 5 MG tablet Take 5 mg by mouth daily.    Marland Kitchen losartan (COZAAR) 100 MG tablet 1 tablet (100 mg total) by Per NG  tube route daily. (Patient taking differently: Take 100 mg by mouth daily. ) 30 tablet 0  . Misc Natural Products (OSTEO BI-FLEX ADV JOINT SHIELD) TABS Take 1 tablet by mouth 2 (two) times daily.     . Multiple Vitamins-Minerals (CENTRUM SILVER ADULT 50+) TABS Take 1 tablet by mouth every morning.    . nitroGLYCERIN (NITROSTAT) 0.4 MG SL tablet Place 1 tablet (0.4 mg total) under the tongue every 5 (five) minutes as needed for chest pain. 25 tablet 3  . oseltamivir (TAMIFLU) 75 MG capsule Take 1 capsule (75 mg total) by mouth 2 (two) times daily. 8 capsule 0  . vitamin B-12 (CYANOCOBALAMIN) 1000 MCG tablet 1 tablet (1,000 mcg total) by Per NG tube route daily. (Patient taking differently: Take 1,000 mcg by mouth daily. ) 30 tablet 0   No current facility-administered medications for this visit.     Past Medical History:  Diagnosis Date  . Arthritis    OSTEO  . Atrial fibrillation and flutter ([place])   . Bladder cancer ([place])   . Coronary artery disease   . Dysrhythmia   . Exertional angina ([place]) 07/08/2014  . H/O urinary frequency   . Hematuria   . Hypertension   . Iron deficiency anemia   . Kidney stones    hx of   . Mild obstructive sleep apnea    no cpap  . S/P coronary artery stent placement 07/24/2014  . Weakness     Past  Surgical History:  Procedure Laterality Date  . CARDIAC CATHETERIZATION    . CARDIOVASCULAR STRESS TEST  12-15-2013  DR Kate Sable ([place])   MILD - MODERATE PERI-INFARCT ISCHEMIA  OF INFERIOR WALL, MID-INFEROSEPTAL WALL, MID-INFEROLATERAL WALL, & BASAL INFERIOR WALL/   NORMAL LVF /  EF 59%/  INTERMEDIATE RISK STUDY  . COLONOSCOPY  07/04/2012   Procedure: COLONOSCOPY;  Surgeon: Rogene Houston, MD;  [facility];  Service: Endoscopy;  Laterality: N/A;  320  . CORONARY STENT PLACEMENT  07/08/2014   PTCA of RCA   DR COOPER  . CYSTOSCOPY W/ RETROGRADES Bilateral 12/31/2013   Procedure: CYSTOSCOPY WITH RETROGRADE PYELOGRAM;   Surgeon: Alexis Frock, MD;  Location: [facility];  Service: Urology;  Laterality: Bilateral;  . CYSTOSCOPY W/ RETROGRADES Bilateral 01/20/2015   Procedure: CYSTOSCOPY WITH RETROGRADE PYELOGRAM;  Surgeon: Alexis Frock, MD;  Location: [facility];  Service: Urology;  Laterality: Bilateral;  . ESOPHAGOGASTRODUODENOSCOPY  07/04/2012   Procedure: ESOPHAGOGASTRODUODENOSCOPY (EGD);  Surgeon: Rogene Houston, MD;  [facility];  Service: Endoscopy;  Laterality: N/A;  . EYE SURGERY     past cataract surgery bilat  . HERNIA REPAIR Right   . LEFT HEART CATHETERIZATION WITH CORONARY ANGIOGRAM N/A 07/08/2014   Procedure: LEFT HEART CATHETERIZATION WITH CORONARY ANGIOGRAM;  Surgeon: Blane Ohara, MD;  Location: [facility] CATH LAB;  Service: Cardiovascular;  Laterality: N/A;  . PERCUTANEOUS CORONARY STENT INTERVENTION (PCI-S)  07/08/2014   Procedure: PERCUTANEOUS CORONARY STENT INTERVENTION (PCI-S);  Surgeon: Blane Ohara, MD;  Location: [facility] CATH LAB;  Service: Cardiovascular;;  . TOTAL KNEE ARTHROPLASTY Bilateral 2000  &  2003?  . TRANSTHORACIC ECHOCARDIOGRAM  09-11-2013   MODERATE LVH/  EF 55-60%/  MILD MR & TR/  MILD TO MODERATE CALCIFIED AV WITHOUT STENOSIS/  MILD LAE/   MODERATE PR  . TRANSURETHRAL RESECTION OF BLADDER TUMOR N/A 01/20/2015   Procedure: TRANSURETHRAL RESECTION OF BLADDER TUMOR (TURBT);  Surgeon: Alexis Frock, MD;  [facility];  Service: Urology;  Laterality: N/A;  . TRANSURETHRAL RESECTION OF BLADDER TUMOR WITH GYRUS (TURBT-GYRUS) N/A 12/31/2013   Procedure: TRANSURETHRAL RESECTION OF BLADDER TUMOR WITH GYRUS (TURBT-GYRUS);  Surgeon: Alexis Frock, MD;  Location[facility];  Service: Urology;  Laterality: N/A;  . ZENKER'S DIVERTICULECTOMY N/A 09/09/2015   Procedure: ENDOSCOPIC ZENKER'S DIVERTICULECTOMY;  Surgeon: Izora Gala, MD;  Location: [facility];  Service: ENT;  Laterality: N/A;  . ZENKER'S DIVERTICULECTOMY ENDOSCOPIC  09/09/2015    Social History   Socioeconomic History  .  Marital status: Married    Spouse name: Not on file  . Number of children: Not on file  . Years of education: Not on file  . Highest education level: Not on file  Occupational History  . Not on file  Social Needs  . Financial resource strain: Not on file  . Food insecurity:    Worry: Not on file    Inability: Not on file  . Transportation needs:    Medical: Not on file    Non-medical: Not on file  Tobacco Use  . Smoking status: Former Smoker    Packs/day: 2.00    Years: 20.00    Pack years: 40.00    Types: Cigarettes, Cigars, Pipe    Last attempt to quit: 08/21/1968    Years since quitting: 50.0  . Smokeless tobacco: Never Used  . Tobacco comment: quit smoking in 1980  Substance and Sexual Activity  . Alcohol use: No    Alcohol/week: 0.0 standard  drinks  . Drug use: No  . Sexual activity: Not on file  Lifestyle  . Physical activity:    Days per week: Not on file    Minutes per session: Not on file  . Stress: Not on file  Relationships  . Social connections:    Talks on phone: Not on file    Gets together: Not on file    Attends religious service: Not on file    Active member of club or organization: Not on file    Attends meetings of clubs or organizations: Not on file    Relationship status: Not on file  . Intimate partner violence:    Fear of current or ex partner: Not on file    Emotionally abused: Not on file    Physically abused: Not on file    Forced sexual activity: Not on file  Other Topics Concern  . Not on file  Social History Narrative  . Not on file     Vitals:   09/09/18 1325  BP: (!) 160/52  Pulse: 72  SpO2: 96%  Weight: 201 lb (91.2 kg)  Height: 6' (1.829 m)    Wt Readings from Last 3 Encounters:  09/09/18 201 lb (91.2 kg)  09/28/17 212 lb 15.4 oz (96.6 kg)  08/07/17 202 lb (91.6 kg)     PHYSICAL EXAM General: NAD HEENT: Normal. Neck: No JVD, no thyromegaly. Lungs: Clear to auscultation bilaterally with normal respiratory  effort. CV: Regular rate and rhythm, normal S1/S2, no S3/S4, II/VI ejection systolic murmur at RUSB and II/VI apical holosystolic murmur. No pretibial or periankle edema.    Abdomen: Soft, nontender, no distention.  Neurologic: Alert and oriented.  Psych: Normal affect. Skin: Normal. Musculoskeletal: No gross deformities.    ECG: Reviewed above under Subjective   Labs: Lab Results  Component Value Date/Time   K 3.7 09/27/2017 04:57 AM   BUN 16 09/27/2017 04:57 AM   CREATININE 0.55 (L) 09/27/2017 04:57 AM   ALT 29 09/26/2017 07:14 AM   HGB 13.1 09/28/2017 08:30 AM     Lipids: No results found for: LDLCALC, LDLDIRECT, CHOL, TRIG, HDL     ASSESSMENT AND PLAN:  1. CAD s/p BMS to RCA: He continues to experience fatigue and underwent an abnormal nuclear stress test in April 2015 but could not undergo cardiac catheterization at that time due to hematuria, anemia, and complications from bladder cancer.  I will obtain a Lexiscan Myoview. ContinueASA (I will reduce to 81 mg),Coreg 3.125 mg bid, and Lipitor 20 mg.   2. Atrial fibrillation/flutter: Symptomatically stable. Warfarin was previously discontinued due to bladder cancer and issues with hematuria.  He does have a history of frequent falls and walks with a walker.  I will not pursue the reinitiation of anticoagulation. He remains on aspirin primarily for secondary prevention of coronary artery disease.  I will reduce to 81 mg.  3. Essential hypertension:Controlled. No changes.  4. Bladder cancer: s/p resection on 01/20/15.  No further issues with hematuria.  I will obtain a copy of labs from PCP.  5.  Aortic stenosis: Mild in severity by echocardiogram on 09/26/2017.    Disposition: Follow up 3 months   Kate Sable, M.D., F.A.C.C.

## 2018-09-09 NOTE — Telephone Encounter (Signed)
Pre-cert Verification for the following procedure   Lexiscan scheduled 09-18-2018 at [facility]

## 2018-09-10 DIAGNOSIS — I482 Chronic atrial fibrillation, unspecified: Secondary | ICD-10-CM | POA: Diagnosis not present

## 2018-09-10 DIAGNOSIS — R7301 Impaired fasting glucose: Secondary | ICD-10-CM | POA: Diagnosis not present

## 2018-09-10 DIAGNOSIS — D509 Iron deficiency anemia, unspecified: Secondary | ICD-10-CM | POA: Diagnosis not present

## 2018-09-10 DIAGNOSIS — D519 Vitamin B12 deficiency anemia, unspecified: Secondary | ICD-10-CM | POA: Diagnosis not present

## 2018-09-10 DIAGNOSIS — I10 Essential (primary) hypertension: Secondary | ICD-10-CM | POA: Diagnosis not present

## 2018-09-12 ENCOUNTER — Encounter: Payer: Self-pay | Admitting: *Deleted

## 2018-09-18 ENCOUNTER — Encounter (HOSPITAL_COMMUNITY)
Admission: RE | Admit: 2018-09-18 | Discharge: 2018-09-18 | Disposition: A | Discharge status: Home / Self Care | Payer: Medicare Other | Source: Ambulatory Visit | Attending: Cardiovascular Disease | Admitting: Cardiovascular Disease

## 2018-09-18 ENCOUNTER — Encounter (HOSPITAL_COMMUNITY): Admission: RE | Admit: 2018-09-18 | Payer: Medicare Other | Source: Ambulatory Visit

## 2018-09-18 DIAGNOSIS — I25118 Atherosclerotic heart disease of native coronary artery with other forms of angina pectoris: Secondary | ICD-10-CM | POA: Insufficient documentation

## 2018-09-18 DIAGNOSIS — R5383 Other fatigue: Secondary | ICD-10-CM

## 2018-09-18 DIAGNOSIS — I208 Other forms of angina pectoris: Secondary | ICD-10-CM

## 2018-09-19 DIAGNOSIS — R7301 Impaired fasting glucose: Secondary | ICD-10-CM | POA: Diagnosis not present

## 2018-09-19 DIAGNOSIS — I10 Essential (primary) hypertension: Secondary | ICD-10-CM | POA: Diagnosis not present

## 2018-09-19 DIAGNOSIS — R5383 Other fatigue: Secondary | ICD-10-CM | POA: Diagnosis not present

## 2018-09-19 DIAGNOSIS — C679 Malignant neoplasm of bladder, unspecified: Secondary | ICD-10-CM | POA: Diagnosis not present

## 2018-09-19 DIAGNOSIS — R531 Weakness: Secondary | ICD-10-CM | POA: Diagnosis not present

## 2018-09-19 DIAGNOSIS — I2510 Atherosclerotic heart disease of native coronary artery without angina pectoris: Secondary | ICD-10-CM | POA: Diagnosis not present

## 2018-09-19 DIAGNOSIS — I4891 Unspecified atrial fibrillation: Secondary | ICD-10-CM | POA: Diagnosis not present

## 2018-09-19 DIAGNOSIS — M353 Polymyalgia rheumatica: Secondary | ICD-10-CM | POA: Diagnosis not present

## 2018-09-20 ENCOUNTER — Encounter (HOSPITAL_COMMUNITY): Payer: Self-pay

## 2018-09-20 ENCOUNTER — Telehealth: Payer: Self-pay | Admitting: *Deleted

## 2018-09-20 ENCOUNTER — Encounter (HOSPITAL_COMMUNITY)
Admission: RE | Admit: 2018-09-20 | Discharge: 2018-09-20 | Disposition: A | Discharge status: Home / Self Care | Payer: Medicare Other | Source: Ambulatory Visit | Attending: Cardiovascular Disease | Admitting: Cardiovascular Disease

## 2018-09-20 ENCOUNTER — Encounter (HOSPITAL_BASED_OUTPATIENT_CLINIC_OR_DEPARTMENT_OTHER)
Admission: RE | Admit: 2018-09-20 | Discharge: 2018-09-20 | Disposition: A | Discharge status: Home / Self Care | Payer: Medicare Other | Source: Ambulatory Visit | Attending: Cardiovascular Disease | Admitting: Cardiovascular Disease

## 2018-09-20 DIAGNOSIS — I208 Other forms of angina pectoris: Secondary | ICD-10-CM | POA: Diagnosis not present

## 2018-09-20 DIAGNOSIS — R5383 Other fatigue: Secondary | ICD-10-CM

## 2018-09-20 DIAGNOSIS — I25118 Atherosclerotic heart disease of native coronary artery with other forms of angina pectoris: Secondary | ICD-10-CM | POA: Insufficient documentation

## 2018-09-20 HISTORY — DX: Unspecified malignant neoplasm of skin, unspecified: C44.90

## 2018-09-20 LAB — NM MYOCAR MULTI W/SPECT W/WALL MOTION / EF
CHL CUP NUCLEAR SDS: 2
CHL CUP NUCLEAR SSS: 4
LHR: 0.4
LV sys vol: 48 mL
LVDIAVOL: 101 mL (ref 62–150)
Peak HR: 86 {beats}/min
Rest HR: 66 {beats}/min
SRS: 2
TID: 1.53

## 2018-09-20 MED ORDER — REGADENOSON 0.4 MG/5ML IV SOLN
INTRAVENOUS | Status: AC
  Administered 2018-09-20: 0.4 mg via INTRAVENOUS
  Filled 2018-09-20: qty 5

## 2018-09-20 MED ORDER — TECHNETIUM TC 99M TETROFOSMIN IV KIT
30.0000 | PACK | Freq: Once | INTRAVENOUS | Status: AC | PRN
  Administered 2018-09-20: 30 via INTRAVENOUS

## 2018-09-20 MED ORDER — SODIUM CHLORIDE 0.9% FLUSH
INTRAVENOUS | Status: AC
  Administered 2018-09-20: 10 mL via INTRAVENOUS
  Filled 2018-09-20: qty 10

## 2018-09-20 MED ORDER — TECHNETIUM TC 99M TETROFOSMIN IV KIT
10.0000 | PACK | Freq: Once | INTRAVENOUS | Status: AC | PRN
  Administered 2018-09-20: 10.7 via INTRAVENOUS

## 2018-09-20 NOTE — Telephone Encounter (Signed)
Notes recorded by Laurine Blazer, LPN on 08/22/5850 at 7:78 PM EST Patient notified. Copy to pmd. Follow up scheduled for April with Dr. Paralee Cancel office.   ------  Notes recorded by Herminio Commons, MD on 09/20/2018 at 12:19 PM EST No significant blockages seen.

## 2018-10-04 DIAGNOSIS — D519 Vitamin B12 deficiency anemia, unspecified: Secondary | ICD-10-CM | POA: Diagnosis not present

## 2018-11-01 ENCOUNTER — Other Ambulatory Visit: Payer: Self-pay

## 2018-11-01 ENCOUNTER — Other Ambulatory Visit (HOSPITAL_COMMUNITY): Payer: Self-pay | Admitting: Adult Health Nurse Practitioner

## 2018-11-01 ENCOUNTER — Ambulatory Visit (HOSPITAL_COMMUNITY)
Admission: RE | Admit: 2018-11-01 | Discharge: 2018-11-01 | Disposition: A | Discharge status: Home / Self Care | Payer: Medicare Other | Source: Ambulatory Visit | Attending: Adult Health Nurse Practitioner | Admitting: Adult Health Nurse Practitioner

## 2018-11-01 DIAGNOSIS — M19011 Primary osteoarthritis, right shoulder: Secondary | ICD-10-CM | POA: Diagnosis not present

## 2018-11-01 DIAGNOSIS — M79601 Pain in right arm: Secondary | ICD-10-CM

## 2018-11-01 DIAGNOSIS — M19031 Primary osteoarthritis, right wrist: Secondary | ICD-10-CM | POA: Diagnosis not present

## 2018-11-01 DIAGNOSIS — D519 Vitamin B12 deficiency anemia, unspecified: Secondary | ICD-10-CM | POA: Diagnosis not present

## 2018-12-10 DIAGNOSIS — D519 Vitamin B12 deficiency anemia, unspecified: Secondary | ICD-10-CM | POA: Diagnosis not present

## 2018-12-12 ENCOUNTER — Telehealth: Payer: Self-pay | Admitting: *Deleted

## 2018-12-12 NOTE — Telephone Encounter (Signed)
Pt contacted per Dr Bronson Ing. History reviewed. No symptoms to suggest any unstable cardiac conditions. Based on discussion, with current pandemic situation, we have postponed 12/13/18 appointment until July 2020. If symptoms change, pt has been instructed to contact our office. Pt aware that telehealth appt would be an option if needed

## 2018-12-16 ENCOUNTER — Ambulatory Visit: Payer: Medicare Other | Admitting: Cardiovascular Disease

## 2018-12-19 DIAGNOSIS — I10 Essential (primary) hypertension: Secondary | ICD-10-CM | POA: Diagnosis not present

## 2018-12-19 DIAGNOSIS — D509 Iron deficiency anemia, unspecified: Secondary | ICD-10-CM | POA: Diagnosis not present

## 2018-12-19 DIAGNOSIS — D519 Vitamin B12 deficiency anemia, unspecified: Secondary | ICD-10-CM | POA: Diagnosis not present

## 2018-12-19 DIAGNOSIS — R7301 Impaired fasting glucose: Secondary | ICD-10-CM | POA: Diagnosis not present

## 2018-12-25 DIAGNOSIS — I350 Nonrheumatic aortic (valve) stenosis: Secondary | ICD-10-CM | POA: Diagnosis not present

## 2018-12-25 DIAGNOSIS — R5383 Other fatigue: Secondary | ICD-10-CM | POA: Diagnosis not present

## 2018-12-25 DIAGNOSIS — I480 Paroxysmal atrial fibrillation: Secondary | ICD-10-CM | POA: Diagnosis not present

## 2018-12-25 DIAGNOSIS — I10 Essential (primary) hypertension: Secondary | ICD-10-CM | POA: Diagnosis not present

## 2018-12-25 DIAGNOSIS — I25118 Atherosclerotic heart disease of native coronary artery with other forms of angina pectoris: Secondary | ICD-10-CM | POA: Diagnosis not present

## 2018-12-25 DIAGNOSIS — D72829 Elevated white blood cell count, unspecified: Secondary | ICD-10-CM | POA: Diagnosis not present

## 2018-12-25 DIAGNOSIS — C670 Malignant neoplasm of trigone of bladder: Secondary | ICD-10-CM | POA: Diagnosis not present

## 2018-12-25 DIAGNOSIS — E782 Mixed hyperlipidemia: Secondary | ICD-10-CM | POA: Diagnosis not present

## 2018-12-25 DIAGNOSIS — D519 Vitamin B12 deficiency anemia, unspecified: Secondary | ICD-10-CM | POA: Diagnosis not present

## 2018-12-25 DIAGNOSIS — R531 Weakness: Secondary | ICD-10-CM | POA: Diagnosis not present

## 2018-12-25 DIAGNOSIS — M353 Polymyalgia rheumatica: Secondary | ICD-10-CM | POA: Diagnosis not present

## 2019-01-15 DIAGNOSIS — Z0000 Encounter for general adult medical examination without abnormal findings: Secondary | ICD-10-CM | POA: Diagnosis not present

## 2019-01-17 DIAGNOSIS — I10 Essential (primary) hypertension: Secondary | ICD-10-CM | POA: Diagnosis not present

## 2019-01-17 DIAGNOSIS — D509 Iron deficiency anemia, unspecified: Secondary | ICD-10-CM | POA: Diagnosis not present

## 2019-01-17 DIAGNOSIS — I499 Cardiac arrhythmia, unspecified: Secondary | ICD-10-CM | POA: Diagnosis not present

## 2019-01-17 DIAGNOSIS — I480 Paroxysmal atrial fibrillation: Secondary | ICD-10-CM | POA: Diagnosis not present

## 2019-01-22 ENCOUNTER — Telehealth: Payer: Self-pay | Admitting: Cardiovascular Disease

## 2019-01-22 DIAGNOSIS — I10 Essential (primary) hypertension: Secondary | ICD-10-CM | POA: Diagnosis not present

## 2019-01-22 DIAGNOSIS — D519 Vitamin B12 deficiency anemia, unspecified: Secondary | ICD-10-CM | POA: Diagnosis not present

## 2019-01-22 DIAGNOSIS — I350 Nonrheumatic aortic (valve) stenosis: Secondary | ICD-10-CM | POA: Diagnosis not present

## 2019-01-22 DIAGNOSIS — I480 Paroxysmal atrial fibrillation: Secondary | ICD-10-CM | POA: Diagnosis not present

## 2019-01-22 DIAGNOSIS — E782 Mixed hyperlipidemia: Secondary | ICD-10-CM | POA: Diagnosis not present

## 2019-01-22 DIAGNOSIS — I25118 Atherosclerotic heart disease of native coronary artery with other forms of angina pectoris: Secondary | ICD-10-CM | POA: Diagnosis not present

## 2019-01-22 DIAGNOSIS — C670 Malignant neoplasm of trigone of bladder: Secondary | ICD-10-CM | POA: Diagnosis not present

## 2019-01-22 DIAGNOSIS — M353 Polymyalgia rheumatica: Secondary | ICD-10-CM | POA: Diagnosis not present

## 2019-01-22 DIAGNOSIS — D72829 Elevated white blood cell count, unspecified: Secondary | ICD-10-CM | POA: Diagnosis not present

## 2019-01-22 NOTE — Telephone Encounter (Signed)
Forward to provider for advice.  

## 2019-01-22 NOTE — Telephone Encounter (Signed)
[place] Constable (mid-level) with Dr. Wende Neighbors did a Virtual message with Manuel Le today. States that Manuel Le told him that he has not taken his BP medications for one year now. [name] would like to get Dr. Raylene Everts opinion on starting patient back on his BP medications.  Please call [phone].

## 2019-01-23 NOTE — Telephone Encounter (Signed)
Notes received from Dr. [name] office - did not see any BP readings noted.    Call placed to patient to discuss further - wife answered & stated he was not there, to call back in an hour, then hung up before I could tell her office would be closing at 5:00.    Will email Dr. Bronson Ing the note for his review.

## 2019-01-23 NOTE — Telephone Encounter (Signed)
Will wait until we know what vitals are. Can call Dr. Juel Burrow office tomorrow.

## 2019-01-23 NOTE — Telephone Encounter (Signed)
Note requested from Dr. Juel Burrow office.

## 2019-01-23 NOTE — Telephone Encounter (Signed)
What is BP? HR?

## 2019-01-24 NOTE — Telephone Encounter (Signed)
Notified pt that Dr.Koneswaran did not believe that patient needed BP meds at this time, lm foe mid level [employer] 

## 2019-01-24 NOTE — Telephone Encounter (Signed)
The last 2 visits with Dr.Hall pt had BP 134/78 HR 57, 130/72 HR 76

## 2019-01-24 NOTE — Telephone Encounter (Signed)
If that is without hypertensive therapy, then he doesn't need any BP meds.

## 2019-02-04 DIAGNOSIS — S0121XA Laceration without foreign body of nose, initial encounter: Secondary | ICD-10-CM | POA: Diagnosis not present

## 2019-02-04 DIAGNOSIS — S0101XA Laceration without foreign body of scalp, initial encounter: Secondary | ICD-10-CM | POA: Diagnosis not present

## 2019-02-04 DIAGNOSIS — S025XXA Fracture of tooth (traumatic), initial encounter for closed fracture: Secondary | ICD-10-CM | POA: Diagnosis not present

## 2019-02-04 DIAGNOSIS — R0902 Hypoxemia: Secondary | ICD-10-CM | POA: Diagnosis not present

## 2019-02-04 DIAGNOSIS — S022XXA Fracture of nasal bones, initial encounter for closed fracture: Secondary | ICD-10-CM | POA: Diagnosis not present

## 2019-02-04 DIAGNOSIS — R58 Hemorrhage, not elsewhere classified: Secondary | ICD-10-CM | POA: Diagnosis not present

## 2019-02-04 DIAGNOSIS — W19XXXA Unspecified fall, initial encounter: Secondary | ICD-10-CM | POA: Diagnosis not present

## 2019-02-04 DIAGNOSIS — S01511A Laceration without foreign body of lip, initial encounter: Secondary | ICD-10-CM | POA: Diagnosis not present

## 2019-02-04 DIAGNOSIS — I10 Essential (primary) hypertension: Secondary | ICD-10-CM | POA: Diagnosis not present

## 2019-02-04 DIAGNOSIS — M5030 Other cervical disc degeneration, unspecified cervical region: Secondary | ICD-10-CM | POA: Diagnosis not present

## 2019-02-04 DIAGNOSIS — Z87891 Personal history of nicotine dependence: Secondary | ICD-10-CM | POA: Diagnosis not present

## 2019-02-06 DIAGNOSIS — Z9181 History of falling: Secondary | ICD-10-CM | POA: Diagnosis not present

## 2019-02-06 DIAGNOSIS — S0993XA Unspecified injury of face, initial encounter: Secondary | ICD-10-CM | POA: Diagnosis not present

## 2019-02-17 DIAGNOSIS — S0993XD Unspecified injury of face, subsequent encounter: Secondary | ICD-10-CM | POA: Diagnosis not present

## 2019-02-17 DIAGNOSIS — S0993XA Unspecified injury of face, initial encounter: Secondary | ICD-10-CM | POA: Diagnosis not present

## 2019-02-17 DIAGNOSIS — Z9181 History of falling: Secondary | ICD-10-CM | POA: Diagnosis not present

## 2019-02-17 DIAGNOSIS — Z4802 Encounter for removal of sutures: Secondary | ICD-10-CM | POA: Diagnosis not present

## 2019-02-27 ENCOUNTER — Telehealth: Payer: Self-pay | Admitting: Cardiovascular Disease

## 2019-02-27 NOTE — Telephone Encounter (Signed)
Virtual Visit Pre-Appointment Phone Call  "(Name), I am calling you today to discuss your upcoming appointment. We are currently trying to limit exposure to the virus that causes COVID-19 by seeing patients at home rather than in the office."  1. "What is the BEST phone number to call the day of the visit?" - include this in appointment notes  2. Do you have or have access to (through a family member/friend) a smartphone with video capability that we can use for your visit?" a. If yes - list this number in appt notes as cell (if different from BEST phone #) and list the appointment type as a VIDEO visit in appointment notes b. If no - list the appointment type as a PHONE visit in appointment notes  3. Confirm consent - "In the setting of the current Covid19 crisis, you are scheduled for a (phone or video) visit with your provider on (date) at (time).  Just as we do with many in-office visits, in order for you to participate in this visit, we must obtain consent.  If you'd like, I can send this to your mychart (if signed up) or email for you to review.  Otherwise, I can obtain your verbal consent now.  All virtual visits are billed to your insurance company just like a normal visit would be.  By agreeing to a virtual visit, we'd like you to understand that the technology does not allow for your provider to perform an examination, and thus may limit your provider's ability to fully assess your condition. If your provider identifies any concerns that need to be evaluated in person, we will make arrangements to do so.  Finally, though the technology is pretty good, we cannot assure that it will always work on either your or our end, and in the setting of a video visit, we may have to convert it to a phone-only visit.  In either situation, we cannot ensure that we have a secure connection.  Are you willing to proceed?" STAFF: Did the patient verbally acknowledge consent to telehealth visit? Document  YES/NO here: yes  4. Advise patient to be prepared - "Two hours prior to your appointment, go ahead and check your blood pressure, pulse, oxygen saturation, and your weight (if you have the equipment to check those) and write them all down. When your visit starts, your provider will ask you for this information. If you have an Apple Watch or Kardia device, please plan to have heart rate information ready on the day of your appointment. Please have a pen and paper handy nearby the day of the visit as well."  5. Give patient instructions for MyChart download to smartphone OR [email or web address] as below if video visit (depending on what platform provider is using)  6. Inform patient they will receive a phone call 15 minutes prior to their appointment time (may be from unknown caller ID) so they should be prepared to answer    TELEPHONE CALL NOTE  Manuel Le has been deemed a candidate for a follow-up tele-health visit to limit community exposure during the Covid-19 pandemic. I spoke with the patient via phone to ensure availability of phone/video source, confirm preferred email & phone number, and discuss instructions and expectations.  I reminded Manuel Le to be prepared with any vital sign and/or heart rhythm information that could potentially be obtained via home monitoring, at the time of his visit. I reminded Manuel Le to expect a phone call prior to  his visit.  Weston Anna 02/27/2019 2:50 PM   INSTRUCTIONS FOR DOWNLOADING THE MYCHART APP TO SMARTPHONE  - The patient must first make sure to have activated MyChart and know their login information - If Apple, go to [employer] and type in MyChart in the search bar and download the app. If Android, ask patient to go to [employer] and type in [place] in the search bar and download the app. The app is free but as with any other app downloads, their phone may require them to verify saved payment information or Apple/Android  password.  - The patient will need to then log into the app with their MyChart username and password, and select [facility] as their healthcare provider to link the account. When it is time for your visit, go to the MyChart app, find appointments, and click Begin Video Visit. Be sure to Select Allow for your device to access the Microphone and Camera for your visit. You will then be connected, and your provider will be with you shortly.  **If they have any issues connecting, or need assistance please contact MyChart service desk [phone])**  **If using a computer, in order to ensure the best quality for their visit they will need to use either of the following Internet Browsers: [employer], or Google Chrome**  IF USING DOXIMITY or DOXY.ME - The patient will receive a link just prior to their visit by text.     FULL LENGTH CONSENT FOR TELE-HEALTH VISIT   I hereby voluntarily request, consent and authorize [facility] and its employed or contracted physicians, physician assistants, nurse practitioners or other licensed health care professionals (the Practitioner), to provide me with telemedicine health care services (the Services") as deemed necessary by the treating Practitioner. I acknowledge and consent to receive the Services by the Practitioner via telemedicine. I understand that the telemedicine visit will involve communicating with the Practitioner through live audiovisual communication technology and the disclosure of certain medical information by electronic transmission. I acknowledge that I have been given the opportunity to request an in-person assessment or other available alternative prior to the telemedicine visit and am voluntarily participating in the telemedicine visit.  I understand that I have the right to withhold or withdraw my consent to the use of telemedicine in the course of my care at any time, without affecting my right to future care or treatment,  and that the Practitioner or I may terminate the telemedicine visit at any time. I understand that I have the right to inspect all information obtained and/or recorded in the course of the telemedicine visit and may receive copies of available information for a reasonable fee.  I understand that some of the potential risks of receiving the Services via telemedicine include:   Delay or interruption in medical evaluation due to technological equipment failure or disruption;  Information transmitted may not be sufficient (e.g. poor resolution of images) to allow for appropriate medical decision making by the Practitioner; and/or   In rare instances, security protocols could fail, causing a breach of personal health information.  Furthermore, I acknowledge that it is my responsibility to provide information about my medical history, conditions and care that is complete and accurate to the best of my ability. I acknowledge that Practitioner's advice, recommendations, and/or decision may be based on factors not within their control, such as incomplete or inaccurate data provided by me or distortions of diagnostic images or specimens that may result from electronic transmissions. I  understand that the practice of medicine is not an exact science and that Practitioner makes no warranties or guarantees regarding treatment outcomes. I acknowledge that I will receive a copy of this consent concurrently upon execution via email to the email address I last provided but may also request a printed copy by calling the office of [facility].    I understand that my insurance will be billed for this visit.   I have read or had this consent read to me.  I understand the contents of this consent, which adequately explains the benefits and risks of the Services being provided via telemedicine.   I have been provided ample opportunity to ask questions regarding this consent and the Services and have had my questions  answered to my satisfaction.  I give my informed consent for the services to be provided through the use of telemedicine in my medical care  By participating in this telemedicine visit I agree to the above.

## 2019-03-06 ENCOUNTER — Telehealth (INDEPENDENT_AMBULATORY_CARE_PROVIDER_SITE_OTHER): Payer: Medicare Other | Admitting: Cardiovascular Disease

## 2019-03-06 ENCOUNTER — Encounter: Payer: Self-pay | Admitting: Cardiovascular Disease

## 2019-03-06 VITALS — BP 170/77 | HR 63 | Ht 72.0 in | Wt 190.0 lb

## 2019-03-06 DIAGNOSIS — I25118 Atherosclerotic heart disease of native coronary artery with other forms of angina pectoris: Secondary | ICD-10-CM | POA: Diagnosis not present

## 2019-03-06 DIAGNOSIS — I10 Essential (primary) hypertension: Secondary | ICD-10-CM

## 2019-03-06 DIAGNOSIS — I350 Nonrheumatic aortic (valve) stenosis: Secondary | ICD-10-CM

## 2019-03-06 DIAGNOSIS — Z955 Presence of coronary angioplasty implant and graft: Secondary | ICD-10-CM

## 2019-03-06 DIAGNOSIS — I480 Paroxysmal atrial fibrillation: Secondary | ICD-10-CM

## 2019-03-06 NOTE — Patient Instructions (Signed)

## 2019-03-06 NOTE — Progress Notes (Signed)
Virtual Visit via Telephone Note   This visit type was conducted due to national recommendations for restrictions regarding the COVID-19 Pandemic (e.g. social distancing) in an effort to limit this patient's exposure and mitigate transmission in our community.  Due to his co-morbid illnesses, this patient is at least at moderate risk for complications without adequate follow up.  This format is felt to be most appropriate for this patient at this time.  The patient did not have access to video technology/had technical difficulties with video requiring transitioning to audio format only (telephone).  All issues noted in this document were discussed and addressed.  No physical exam could be performed with this format.  Please refer to the patient's chart for his  consent to telehealth for [facility].   Date:  03/06/2019   ID:  Manuel Le, DOB [birth year], MRN [id number]  Patient Location: Home Provider Location: Office  PCP:  Celene Squibb, MD  Cardiologist:  Kate Sable, MD  Electrophysiologist:  None   Evaluation Performed:  Follow-Up Visit  Chief Complaint:  CAD  History of Present Illness:    Manuel Le is a 85 y.o. male with coronary artery disease. He underwent bare-metal stent placement to the ostium of the RCA on 07/08/14. He also has a history of atrial fibrillation and flutter. He underwent transurethral resection for bladder tumor recurrence on 01/20/15.   The patient denies any symptoms of chest pain, palpitations, shortness of breath, lightheadedness, dizziness, leg swelling, orthopnea, PND, and syncope.  He had a fall a month ago and injured his face. He lost his balance when walking out of a department store. He broke his nose and lost a tooth and required stitches. He said he is doing better now.  He denies any bleeding problems.  The patient does not have symptoms concerning for COVID-19 infection (fever, chills, cough, or new shortness of breath).     Past Medical History:  Diagnosis Date  . Arthritis    OSTEO  . Atrial fibrillation and flutter ([place])   . Bladder cancer ([place])   . Coronary artery disease   . Dysrhythmia   . Exertional angina ([place]) 07/08/2014  . H/O urinary frequency   . Hematuria   . Hypertension   . Iron deficiency anemia   . Kidney stones    hx of   . Mild obstructive sleep apnea    no cpap  . S/P coronary artery stent placement 07/24/2014  . Skin cancer   . Weakness    Past Surgical History:  Procedure Laterality Date  . CARDIAC CATHETERIZATION    . CARDIOVASCULAR STRESS TEST  12-15-2013  DR Kate Sable ([place])   MILD - MODERATE PERI-INFARCT ISCHEMIA  OF INFERIOR WALL, MID-INFEROSEPTAL WALL, MID-INFEROLATERAL WALL, & BASAL INFERIOR WALL/   NORMAL LVF /  EF 59%/  INTERMEDIATE RISK STUDY  . COLONOSCOPY  07/04/2012   Procedure: COLONOSCOPY;  Surgeon: Rogene Houston, MD;  [facility];  Service: Endoscopy;  Laterality: N/A;  320  . CORONARY STENT PLACEMENT  07/08/2014   PTCA of RCA   DR COOPER  . CYSTOSCOPY W/ RETROGRADES Bilateral 12/31/2013   Procedure: CYSTOSCOPY WITH RETROGRADE PYELOGRAM;  Surgeon: Alexis Frock, MD;  Location: [facility];  Service: Urology;  Laterality: Bilateral;  . CYSTOSCOPY W/ RETROGRADES Bilateral 01/20/2015   Procedure: CYSTOSCOPY WITH RETROGRADE PYELOGRAM;  Surgeon: Alexis Frock, MD;  Location: [facility];  Service: Urology;  Laterality: Bilateral;  . ESOPHAGOGASTRODUODENOSCOPY  07/04/2012  Procedure: ESOPHAGOGASTRODUODENOSCOPY (EGD);  Surgeon: Rogene Houston, MD;  [facility];  Service: Endoscopy;  Laterality: N/A;  . EYE SURGERY     past cataract surgery bilat  . HERNIA REPAIR Right   . LEFT HEART CATHETERIZATION WITH CORONARY ANGIOGRAM N/A 07/08/2014   Procedure: LEFT HEART CATHETERIZATION WITH CORONARY ANGIOGRAM;  Surgeon: Blane Ohara, MD;  Location[facility] CATH LAB;  Service: Cardiovascular;  Laterality: N/A;  . PERCUTANEOUS CORONARY  STENT INTERVENTION (PCI-S)  07/08/2014   Procedure: PERCUTANEOUS CORONARY STENT INTERVENTION (PCI-S);  Surgeon: Blane Ohara, MD;  Location: [facility] CATH LAB;  Service: Cardiovascular;;  . TOTAL KNEE ARTHROPLASTY Bilateral 2000  &  2003?  . TRANSTHORACIC ECHOCARDIOGRAM  09-11-2013   MODERATE LVH/  EF 55-60%/  MILD MR & TR/  MILD TO MODERATE CALCIFIED AV WITHOUT STENOSIS/  MILD LAE/   MODERATE PR  . TRANSURETHRAL RESECTION OF BLADDER TUMOR N/A 01/20/2015   Procedure: TRANSURETHRAL RESECTION OF BLADDER TUMOR (TURBT);  Surgeon: Alexis Frock, MD;  [facility];  Service: Urology;  Laterality: N/A;  . TRANSURETHRAL RESECTION OF BLADDER TUMOR WITH GYRUS (TURBT-GYRUS) N/A 12/31/2013   Procedure: TRANSURETHRAL RESECTION OF BLADDER TUMOR WITH GYRUS (TURBT-GYRUS);  Surgeon: Alexis Frock, MD;  Location: [facility];  Service: Urology;  Laterality: N/A;  . ZENKER'S DIVERTICULECTOMY N/A 09/09/2015   Procedure: ENDOSCOPIC ZENKER'S DIVERTICULECTOMY;  Surgeon: Izora Gala, MD;  Location: [facility];  Service: ENT;  Laterality: N/A;  . ZENKER'S DIVERTICULECTOMY ENDOSCOPIC  09/09/2015     Current Meds  Medication Sig  . amLODipine (NORVASC) 5 MG tablet Take 5 mg by mouth daily.  Marland Kitchen aspirin 81 MG tablet Take 1 tablet (81 mg total) by mouth daily.  . carvedilol (COREG) 3.125 MG tablet 1 tablet (3.125 mg total) by Per NG tube route 2 (two) times daily with a meal. (Patient taking differently: Take 3.125 mg by mouth 2 (two) times daily with a meal. )  . Cholecalciferol (VITAMIN D-3) 1000 units CAPS 1 capsule (1,000 Units total) by Per NG tube route daily. (Patient taking differently: Take 1 capsule by mouth daily. )  . ferrous sulfate 325 (65 FE) MG EC tablet Take 325 mg by mouth daily with breakfast.  . finasteride (PROSCAR) 5 MG tablet Take 5 mg by mouth daily.  Marland Kitchen losartan (COZAAR) 100 MG tablet 1 tablet (100 mg total) by Per NG tube route daily. (Patient taking differently: Take 100 mg by mouth daily. )  . Misc Natural  Products (OSTEO BI-FLEX ADV JOINT SHIELD) TABS Take 1 tablet by mouth 2 (two) times daily.   . Multiple Vitamins-Minerals (CENTRUM SILVER ADULT 50+) TABS Take 1 tablet by mouth every morning.  . nitroGLYCERIN (NITROSTAT) 0.4 MG SL tablet Place 1 tablet (0.4 mg total) under the tongue every 5 (five) minutes as needed for chest pain.  . vitamin B-12 (CYANOCOBALAMIN) 1000 MCG tablet 1 tablet (1,000 mcg total) by Per NG tube route daily. (Patient taking differently: Take 1,000 mcg by mouth daily. )     Allergies:   Patient has no known allergies.   Social History   Tobacco Use  . Smoking status: Former Smoker    Packs/day: 2.00    Years: 20.00    Pack years: 40.00    Types: Cigarettes, Cigars, Pipe    Quit date: 08/21/1968    Years since quitting: 50.5  . Smokeless tobacco: Never Used  . Tobacco comment: quit smoking in 1980  Substance Use Topics  . Alcohol use: No  Alcohol/week: 0.0 standard drinks  . Drug use: No     Family Hx: The patient's family history is not on file.  ROS:   Please see the history of present illness.     All other systems reviewed and are negative.   Prior CV studies:   The following studies were reviewed today:  Echocardiogram 09/26/2017 showed normal left ventricular systolic function and regional wall motion, LVEF 55 to 60%, mild LVH, moderately calcified aortic annulus and moderately calcified leaflets with probable mild aortic stenosis, and moderate left atrial dilatation.  Nuclear stress test 09/20/2018:   RBBB with PVC's seen.  Defect 1: There is a medium defect of moderate severity present in the mid inferoseptal and mid inferior location. There is excessive soft tissue attenuation artifact making rest/stress image comparison difficult. Wall motion appears grossly normal. Some evidence of reverse redistribution.  This is a low risk study. No significant ischemic zones.  Nuclear stress EF: 53%.     Labs/Other Tests and Data Reviewed:     EKG:  No ECG reviewed.  Recent Labs: No results found for requested labs within last 8760 hours.   Recent Lipid Panel No results found for: CHOL, TRIG, HDL, CHOLHDL, LDLCALC, LDLDIRECT  Wt Readings from Last 3 Encounters:  03/06/19 190 lb (86.2 kg)  09/09/18 201 lb (91.2 kg)  09/28/17 212 lb 15.4 oz (96.6 kg)     Objective:    Vital Signs:  BP (!) 170/77   Pulse 63   Ht 6' (1.829 m)   Wt 190 lb (86.2 kg)   BMI 25.77 kg/m    VITAL SIGNS:  reviewed  ASSESSMENT & PLAN:    1. CAD s/p BMS to RCA:  Symptomatically stable.  He underwent a low risk nuclear stress test on 09/20/2018.  ContinueASA 81 mg andCoreg 3.125 mgbid. He is no longer on statin therapy.  2. Paroxysmal atrial fibrillation/flutter:Symptomatically stable. Warfarin was previously discontinued due to bladder cancer and issues with hematuria. He does have a history of frequent falls and walks with a walker. I will not pursue the reinitiation of anticoagulation. He remains on aspirin primarily for secondary prevention of coronary artery disease.   3. Essential hypertension:Blood pressure is significantly elevated.  He checks it at home routinely and systolic readings are in the 160 range.  I will increase amlodipine to 10 mg daily.  4. Bladder cancer: s/p resection on 01/20/15.No further issues with hematuria.   5. Aortic stenosis: Mild in severity by echocardiogram on 09/26/2017.   COVID-19 Education: The signs and symptoms of COVID-19 were discussed with the patient and how to seek care for testing (follow up with PCP or arrange E-visit).  The importance of social distancing was discussed today.  Time:   Today, I have spent 10 minutes with the patient with telehealth technology discussing the above problems.     Medication Adjustments/Labs and Tests Ordered: Current medicines are reviewed at length with the patient today.  Concerns regarding medicines are outlined above.   Tests Ordered: No orders of  the defined types were placed in this encounter.   Medication Changes: No orders of the defined types were placed in this encounter.   Follow Up:  Virtual Visit in 6 month(s)  Signed, Kate Sable, MD  03/06/2019 9:54 AM    [place]

## 2019-04-22 DIAGNOSIS — M353 Polymyalgia rheumatica: Secondary | ICD-10-CM | POA: Diagnosis not present

## 2019-04-22 DIAGNOSIS — D519 Vitamin B12 deficiency anemia, unspecified: Secondary | ICD-10-CM | POA: Diagnosis not present

## 2019-04-22 DIAGNOSIS — I10 Essential (primary) hypertension: Secondary | ICD-10-CM | POA: Diagnosis not present

## 2019-04-22 DIAGNOSIS — E782 Mixed hyperlipidemia: Secondary | ICD-10-CM | POA: Diagnosis not present

## 2019-04-22 DIAGNOSIS — D72829 Elevated white blood cell count, unspecified: Secondary | ICD-10-CM | POA: Diagnosis not present

## 2019-04-22 DIAGNOSIS — C670 Malignant neoplasm of trigone of bladder: Secondary | ICD-10-CM | POA: Diagnosis not present

## 2019-04-22 DIAGNOSIS — I480 Paroxysmal atrial fibrillation: Secondary | ICD-10-CM | POA: Diagnosis not present

## 2019-04-22 DIAGNOSIS — I350 Nonrheumatic aortic (valve) stenosis: Secondary | ICD-10-CM | POA: Diagnosis not present

## 2019-04-22 DIAGNOSIS — I25118 Atherosclerotic heart disease of native coronary artery with other forms of angina pectoris: Secondary | ICD-10-CM | POA: Diagnosis not present

## 2019-04-25 DIAGNOSIS — R5382 Chronic fatigue, unspecified: Secondary | ICD-10-CM | POA: Diagnosis not present

## 2019-04-25 DIAGNOSIS — Z6827 Body mass index (BMI) 27.0-27.9, adult: Secondary | ICD-10-CM | POA: Diagnosis not present

## 2019-04-25 DIAGNOSIS — M6281 Muscle weakness (generalized): Secondary | ICD-10-CM | POA: Diagnosis not present

## 2019-04-25 DIAGNOSIS — R7301 Impaired fasting glucose: Secondary | ICD-10-CM | POA: Diagnosis not present

## 2019-04-25 DIAGNOSIS — R05 Cough: Secondary | ICD-10-CM | POA: Diagnosis not present

## 2019-04-25 DIAGNOSIS — I10 Essential (primary) hypertension: Secondary | ICD-10-CM | POA: Diagnosis not present

## 2019-04-25 DIAGNOSIS — J069 Acute upper respiratory infection, unspecified: Secondary | ICD-10-CM | POA: Diagnosis not present

## 2019-04-25 DIAGNOSIS — I499 Cardiac arrhythmia, unspecified: Secondary | ICD-10-CM | POA: Diagnosis not present

## 2019-04-25 DIAGNOSIS — Z8551 Personal history of malignant neoplasm of bladder: Secondary | ICD-10-CM | POA: Diagnosis not present

## 2019-04-25 DIAGNOSIS — R1310 Dysphagia, unspecified: Secondary | ICD-10-CM | POA: Diagnosis not present

## 2019-04-25 DIAGNOSIS — D509 Iron deficiency anemia, unspecified: Secondary | ICD-10-CM | POA: Diagnosis not present

## 2019-04-25 DIAGNOSIS — M353 Polymyalgia rheumatica: Secondary | ICD-10-CM | POA: Diagnosis not present

## 2019-05-01 ENCOUNTER — Encounter: Payer: Self-pay | Admitting: Internal Medicine

## 2019-05-01 ENCOUNTER — Telehealth: Payer: Self-pay | Admitting: Cardiovascular Disease

## 2019-05-01 DIAGNOSIS — D519 Vitamin B12 deficiency anemia, unspecified: Secondary | ICD-10-CM | POA: Diagnosis not present

## 2019-05-01 DIAGNOSIS — I350 Nonrheumatic aortic (valve) stenosis: Secondary | ICD-10-CM | POA: Diagnosis not present

## 2019-05-01 DIAGNOSIS — D72829 Elevated white blood cell count, unspecified: Secondary | ICD-10-CM | POA: Diagnosis not present

## 2019-05-01 DIAGNOSIS — R5383 Other fatigue: Secondary | ICD-10-CM | POA: Diagnosis not present

## 2019-05-01 DIAGNOSIS — I10 Essential (primary) hypertension: Secondary | ICD-10-CM | POA: Diagnosis not present

## 2019-05-01 DIAGNOSIS — E782 Mixed hyperlipidemia: Secondary | ICD-10-CM | POA: Diagnosis not present

## 2019-05-01 DIAGNOSIS — I25118 Atherosclerotic heart disease of native coronary artery with other forms of angina pectoris: Secondary | ICD-10-CM | POA: Diagnosis not present

## 2019-05-01 DIAGNOSIS — Z23 Encounter for immunization: Secondary | ICD-10-CM | POA: Diagnosis not present

## 2019-05-01 DIAGNOSIS — C670 Malignant neoplasm of trigone of bladder: Secondary | ICD-10-CM | POA: Diagnosis not present

## 2019-05-01 DIAGNOSIS — M353 Polymyalgia rheumatica: Secondary | ICD-10-CM | POA: Diagnosis not present

## 2019-05-01 DIAGNOSIS — I480 Paroxysmal atrial fibrillation: Secondary | ICD-10-CM | POA: Diagnosis not present

## 2019-05-01 DIAGNOSIS — R531 Weakness: Secondary | ICD-10-CM | POA: Diagnosis not present

## 2019-05-01 NOTE — Telephone Encounter (Signed)
I spoke with patient, he confirmed he only takes ASA 81 mg daily

## 2019-05-01 NOTE — Telephone Encounter (Signed)
Wants to discuss medications and direction on what to do.  Stated the last time that he spoke with Tye [place], he was told he was not on heart medication and now sees there has been change in his medications

## 2019-05-01 NOTE — Telephone Encounter (Addendum)
See 01/22/2019 phone note, pt was told he did not need BP med at that time.Pt was seen by [place] office and pt said he is not on amy medications at all.They recorded bp of 140/68, HR 64.Should pt go back on coreg and losartan? At last tele visit in [place] on 03/06/19 his meds were listed as taking.  [facility] states pt has not had losartan filled since 12/2017 and coreg since 2017, no other pharmacy is listed for pt   Nate with Dr.Hall is the pharmacist and would like gail to call him back at [phone] with med instructions

## 2019-05-02 MED ORDER — LOSARTAN POTASSIUM 25 MG PO TABS: 25.0000 mg | ORAL_TABLET | Freq: Every day | ORAL | 3 refills | Status: DC

## 2019-05-02 NOTE — Telephone Encounter (Signed)
I spoke with Nate, pharmacist with Dr.Hall and advised patient start losartan 25 mg daily, e-scribed to [facility]    No answer at pt home, will try later    Pt notified, will pick up med and start losartan

## 2019-05-02 NOTE — Telephone Encounter (Signed)
Yes, resume losartan at 25 mg daily. No Coreg.

## 2019-05-02 NOTE — Addendum Note (Signed)
Addended by: Barbarann Ehlers A on: 05/02/2019 12:10 PM   Modules accepted: Orders

## 2019-05-28 DIAGNOSIS — I350 Nonrheumatic aortic (valve) stenosis: Secondary | ICD-10-CM | POA: Diagnosis not present

## 2019-05-28 DIAGNOSIS — I25118 Atherosclerotic heart disease of native coronary artery with other forms of angina pectoris: Secondary | ICD-10-CM | POA: Diagnosis not present

## 2019-05-28 DIAGNOSIS — I10 Essential (primary) hypertension: Secondary | ICD-10-CM | POA: Diagnosis not present

## 2019-05-28 DIAGNOSIS — C670 Malignant neoplasm of trigone of bladder: Secondary | ICD-10-CM | POA: Diagnosis not present

## 2019-05-28 DIAGNOSIS — D72829 Elevated white blood cell count, unspecified: Secondary | ICD-10-CM | POA: Diagnosis not present

## 2019-05-28 DIAGNOSIS — I480 Paroxysmal atrial fibrillation: Secondary | ICD-10-CM | POA: Diagnosis not present

## 2019-05-28 DIAGNOSIS — M353 Polymyalgia rheumatica: Secondary | ICD-10-CM | POA: Diagnosis not present

## 2019-05-28 DIAGNOSIS — E782 Mixed hyperlipidemia: Secondary | ICD-10-CM | POA: Diagnosis not present

## 2019-05-28 DIAGNOSIS — D519 Vitamin B12 deficiency anemia, unspecified: Secondary | ICD-10-CM | POA: Diagnosis not present

## 2019-05-29 DIAGNOSIS — M353 Polymyalgia rheumatica: Secondary | ICD-10-CM | POA: Diagnosis not present

## 2019-05-29 DIAGNOSIS — R531 Weakness: Secondary | ICD-10-CM | POA: Diagnosis not present

## 2019-05-29 DIAGNOSIS — I10 Essential (primary) hypertension: Secondary | ICD-10-CM | POA: Diagnosis not present

## 2019-06-18 DIAGNOSIS — F330 Major depressive disorder, recurrent, mild: Secondary | ICD-10-CM | POA: Diagnosis not present

## 2019-06-18 DIAGNOSIS — M353 Polymyalgia rheumatica: Secondary | ICD-10-CM | POA: Diagnosis not present

## 2019-06-18 DIAGNOSIS — R531 Weakness: Secondary | ICD-10-CM | POA: Diagnosis not present

## 2019-06-18 DIAGNOSIS — I10 Essential (primary) hypertension: Secondary | ICD-10-CM | POA: Diagnosis not present

## 2019-07-09 DIAGNOSIS — I10 Essential (primary) hypertension: Secondary | ICD-10-CM | POA: Diagnosis not present

## 2019-07-09 DIAGNOSIS — M353 Polymyalgia rheumatica: Secondary | ICD-10-CM | POA: Diagnosis not present

## 2019-07-09 DIAGNOSIS — R531 Weakness: Secondary | ICD-10-CM | POA: Diagnosis not present

## 2019-07-09 DIAGNOSIS — F330 Major depressive disorder, recurrent, mild: Secondary | ICD-10-CM | POA: Diagnosis not present

## 2019-07-16 DIAGNOSIS — M353 Polymyalgia rheumatica: Secondary | ICD-10-CM | POA: Diagnosis not present

## 2019-07-16 DIAGNOSIS — R531 Weakness: Secondary | ICD-10-CM | POA: Diagnosis not present

## 2019-07-16 DIAGNOSIS — F330 Major depressive disorder, recurrent, mild: Secondary | ICD-10-CM | POA: Diagnosis not present

## 2019-07-16 DIAGNOSIS — R296 Repeated falls: Secondary | ICD-10-CM | POA: Diagnosis not present

## 2019-07-16 DIAGNOSIS — Z8551 Personal history of malignant neoplasm of bladder: Secondary | ICD-10-CM | POA: Diagnosis not present

## 2019-07-16 DIAGNOSIS — R3981 Functional urinary incontinence: Secondary | ICD-10-CM | POA: Diagnosis not present

## 2019-07-16 DIAGNOSIS — I10 Essential (primary) hypertension: Secondary | ICD-10-CM | POA: Diagnosis not present

## 2019-07-21 DIAGNOSIS — R350 Frequency of micturition: Secondary | ICD-10-CM | POA: Diagnosis not present

## 2019-07-21 DIAGNOSIS — C670 Malignant neoplasm of trigone of bladder: Secondary | ICD-10-CM | POA: Diagnosis not present

## 2019-07-21 DIAGNOSIS — N3000 Acute cystitis without hematuria: Secondary | ICD-10-CM | POA: Diagnosis not present

## 2019-07-27 ENCOUNTER — Encounter (HOSPITAL_COMMUNITY): Payer: Self-pay | Admitting: Emergency Medicine

## 2019-07-27 ENCOUNTER — Emergency Department (HOSPITAL_COMMUNITY)
Admission: EM | Admit: 2019-07-27 | Discharge: 2019-07-28 | Disposition: A | Discharge status: Home / Self Care | Payer: Medicare Other | Attending: Emergency Medicine | Admitting: Emergency Medicine

## 2019-07-27 ENCOUNTER — Other Ambulatory Visit: Payer: Self-pay

## 2019-07-27 ENCOUNTER — Emergency Department (HOSPITAL_COMMUNITY): Payer: Medicare Other

## 2019-07-27 DIAGNOSIS — I10 Essential (primary) hypertension: Secondary | ICD-10-CM | POA: Diagnosis not present

## 2019-07-27 DIAGNOSIS — S299XXA Unspecified injury of thorax, initial encounter: Secondary | ICD-10-CM | POA: Diagnosis present

## 2019-07-27 DIAGNOSIS — I2510 Atherosclerotic heart disease of native coronary artery without angina pectoris: Secondary | ICD-10-CM | POA: Insufficient documentation

## 2019-07-27 DIAGNOSIS — Y92009 Unspecified place in unspecified non-institutional (private) residence as the place of occurrence of the external cause: Secondary | ICD-10-CM | POA: Insufficient documentation

## 2019-07-27 DIAGNOSIS — W01198A Fall on same level from slipping, tripping and stumbling with subsequent striking against other object, initial encounter: Secondary | ICD-10-CM | POA: Diagnosis not present

## 2019-07-27 DIAGNOSIS — Z7982 Long term (current) use of aspirin: Secondary | ICD-10-CM | POA: Diagnosis not present

## 2019-07-27 DIAGNOSIS — Y939 Activity, unspecified: Secondary | ICD-10-CM | POA: Diagnosis not present

## 2019-07-27 DIAGNOSIS — Z87891 Personal history of nicotine dependence: Secondary | ICD-10-CM | POA: Insufficient documentation

## 2019-07-27 DIAGNOSIS — Z79899 Other long term (current) drug therapy: Secondary | ICD-10-CM | POA: Diagnosis not present

## 2019-07-27 DIAGNOSIS — S2242XA Multiple fractures of ribs, left side, initial encounter for closed fracture: Secondary | ICD-10-CM | POA: Diagnosis not present

## 2019-07-27 DIAGNOSIS — Y999 Unspecified external cause status: Secondary | ICD-10-CM | POA: Diagnosis not present

## 2019-07-27 MED ORDER — HYDROCODONE-ACETAMINOPHEN 5-325 MG PO TABS
1.0000 | ORAL_TABLET | Freq: Once | ORAL | Status: AC
  Administered 2019-07-27: 1 via ORAL
  Filled 2019-07-27: qty 1

## 2019-07-27 MED ORDER — HYDROCODONE-ACETAMINOPHEN 5-325 MG PO TABS: 1.0000 | ORAL_TABLET | ORAL | 0 refills | Status: DC | PRN

## 2019-07-27 NOTE — ED Provider Notes (Signed)
Medical screening examination/treatment/procedure(s) were conducted as a shared visit with non-physician practitioner(s) and myself.  I personally evaluated the patient during the encounter.      Patient seen by me along with physician assistant.  Patient had a fall at home has pain to the left posterior lower rib area.  Patient in no acute distress no other injuries.  No neck pain did not hit his head.  X-ray with rib series shows a minimally displaced fractures along the posterior 10th and 11th ribs on the left.  No pneumothorax no pleural effusion.  Patient will be assisted with pain control.  And I will send him home with incentive spirometer and precautions.   Fredia Sorrow, MD 07/27/19 [phone]

## 2019-07-27 NOTE — ED Notes (Signed)
Patient transported to X-ray 

## 2019-07-27 NOTE — ED Provider Notes (Signed)
[facility] EMERGENCY DEPARTMENT Provider Note   CSN: [id number] Arrival date & time: 07/27/19  2134     History   Chief Complaint Chief Complaint  Patient presents with  . Fall    HPI Manuel Le is a 85 y.o. male.     HPI   Manuel Le is a 85 y.o. male with past medical history of atrial fibrillation and flutter, (only takes 81 mg ASA) hypertension, iron deficient anemia who presents to the Emergency Department complaining of posterior left rib pain secondary to a mechanical fall that occurred earlier this evening.  He states that he slipped and fell on his carport striking his left posterior ribs on the edge of a step.  Pain is associated with deep breathing and with palpation of the affected area.  Pain is also worse when trying to lie supine.  He denies head injury, LOC, headache, dizziness, abdominal pain, shortness of breath, neck pain, and pain numbness or weakness into his lower extremities.    Past Medical History:  Diagnosis Date  . Arthritis    OSTEO  . Atrial fibrillation and flutter ([place])   . Bladder cancer ([place])   . Coronary artery disease   . Dysrhythmia   . Exertional angina ([place]) 07/08/2014  . H/O urinary frequency   . Hematuria   . Hypertension   . Iron deficiency anemia   . Kidney stones    hx of   . Mild obstructive sleep apnea    no cpap  . S/P coronary artery stent placement 07/24/2014  . Skin cancer   . Weakness     Patient Active Problem List   Diagnosis Date Noted  . Acute respiratory failure with hypoxia ([place])   . Influenza with pneumonia   . Palliative care encounter   . Sepsis ([place]) 09/25/2017  . Fever 09/25/2017  . UTI (urinary tract infection) 09/25/2017  . Abnormal liver function 09/25/2017  . Tachycardia 09/25/2017  . Influenza A   . Pressure ulcer 09/14/2015  . Zenker's diverticulum 09/09/2015  . Bladder cancer ([place]) 07/14/2015  . CAD (coronary artery disease) 07/24/2014  . S/P coronary artery stent placement (RCA  bare metal stent on 07/08/14) 07/24/2014  . Exertional angina ([place]) 07/08/2014  . Encounter for therapeutic drug monitoring 09/12/2013  . Atrial fibrillation ([place]) 09/08/2013  . HTN (hypertension) 09/08/2013  . Fatigue 09/08/2013  . Anemia 06/26/2012    Past Surgical History:  Procedure Laterality Date  . CARDIAC CATHETERIZATION    . CARDIOVASCULAR STRESS TEST  12-15-2013  DR Kate Sable ([place])   MILD - MODERATE PERI-INFARCT ISCHEMIA  OF INFERIOR WALL, MID-INFEROSEPTAL WALL, MID-INFEROLATERAL WALL, & BASAL INFERIOR WALL/   NORMAL LVF /  EF 59%/  INTERMEDIATE RISK STUDY  . COLONOSCOPY  07/04/2012   Procedure: COLONOSCOPY;  Surgeon: Rogene Houston, MD;  [facility];  Service: Endoscopy;  Laterality: N/A;  320  . CORONARY STENT PLACEMENT  07/08/2014   PTCA of RCA   DR COOPER  . CYSTOSCOPY W/ RETROGRADES Bilateral 12/31/2013   Procedure: CYSTOSCOPY WITH RETROGRADE PYELOGRAM;  Surgeon: Alexis Frock, MD;  [facility];  Service: Urology;  Laterality: Bilateral;  . CYSTOSCOPY W/ RETROGRADES Bilateral 01/20/2015   Procedure: CYSTOSCOPY WITH RETROGRADE PYELOGRAM;  Surgeon: Alexis Frock, MD;  [facility];  Service: Urology;  Laterality: Bilateral;  . ESOPHAGOGASTRODUODENOSCOPY  07/04/2012   Procedure: ESOPHAGOGASTRODUODENOSCOPY (EGD);  Surgeon: Rogene Houston, MD;  [facility];  Service: Endoscopy;  Laterality:  N/A;  . EYE SURGERY     past cataract surgery bilat  . HERNIA REPAIR Right   . LEFT HEART CATHETERIZATION WITH CORONARY ANGIOGRAM N/A 07/08/2014   Procedure: LEFT HEART CATHETERIZATION WITH CORONARY ANGIOGRAM;  Surgeon: Blane Ohara, MD;  Location: [facility] CATH LAB;  Service: Cardiovascular;  Laterality: N/A;  . PERCUTANEOUS CORONARY STENT INTERVENTION (PCI-S)  07/08/2014   Procedure: PERCUTANEOUS CORONARY STENT INTERVENTION (PCI-S);  Surgeon: Blane Ohara, MD;  Location: [facility] CATH LAB;  Service: Cardiovascular;;  . TOTAL KNEE  ARTHROPLASTY Bilateral 2000  &  2003?  . TRANSTHORACIC ECHOCARDIOGRAM  09-11-2013   MODERATE LVH/  EF 55-60%/  MILD MR & TR/  MILD TO MODERATE CALCIFIED AV WITHOUT STENOSIS/  MILD LAE/   MODERATE PR  . TRANSURETHRAL RESECTION OF BLADDER TUMOR N/A 01/20/2015   Procedure: TRANSURETHRAL RESECTION OF BLADDER TUMOR (TURBT);  Surgeon: Alexis Frock, MD;  [facility];  Service: Urology;  Laterality: N/A;  . TRANSURETHRAL RESECTION OF BLADDER TUMOR WITH GYRUS (TURBT-GYRUS) N/A 12/31/2013   Procedure: TRANSURETHRAL RESECTION OF BLADDER TUMOR WITH GYRUS (TURBT-GYRUS);  Surgeon: Alexis Frock, MD;  [facility];  Service: Urology;  Laterality: N/A;  . ZENKER'S DIVERTICULECTOMY N/A 09/09/2015   Procedure: ENDOSCOPIC ZENKER'S DIVERTICULECTOMY;  Surgeon: Izora Gala, MD;  Location: [place];  Service: ENT;  Laterality: N/A;  . ZENKER'S DIVERTICULECTOMY ENDOSCOPIC  09/09/2015        Home Medications    Prior to Admission medications   Medication Sig Start Date End Date Taking? Authorizing Provider  aspirin 81 MG tablet Take 1 tablet (81 mg total) by mouth daily. 09/09/18   Herminio Commons, MD  cephALEXin (KEFLEX) 500 MG capsule Take 500 mg by mouth 2 (two) times daily. 4 day course starting on 07/21/2019 07/21/19   [provider]  Cholecalciferol (VITAMIN D-3) 1000 units CAPS 1 capsule (1,000 Units total) by Per NG tube route daily. Patient taking differently: Take 1 capsule by mouth daily.  09/15/15   Izora Gala, MD  ferrous sulfate 325 (65 FE) MG EC tablet Take 325 mg by mouth daily with breakfast.    [provider]  finasteride (PROSCAR) 5 MG tablet Take 5 mg by mouth daily.    [provider]  losartan (COZAAR) 25 MG tablet Take 1 tablet (25 mg total) by mouth daily. 05/02/19 07/31/19  Herminio Commons, MD  losartan (COZAAR) 50 MG tablet Take 50 mg by mouth daily. 06/30/19   [provider]  Misc Natural Products (OSTEO BI-FLEX ADV JOINT SHIELD) TABS  Take 1 tablet by mouth 2 (two) times daily.     [provider]  Multiple Vitamins-Minerals (CENTRUM SILVER ADULT 50+) TABS Take 1 tablet by mouth every morning.    [provider]  nitroGLYCERIN (NITROSTAT) 0.4 MG SL tablet Place 1 tablet (0.4 mg total) under the tongue every 5 (five) minutes as needed for chest pain. 07/03/14   Herminio Commons, MD  sertraline (ZOLOFT) 50 MG tablet Take 50 mg by mouth daily. 06/30/19   [provider]  vitamin B-12 (CYANOCOBALAMIN) 1000 MCG tablet 1 tablet (1,000 mcg total) by Per NG tube route daily. Patient taking differently: Take 1,000 mcg by mouth daily.  09/15/15   Izora Gala, MD    Family History No family history on file.  Social History Social History   Tobacco Use  . Smoking status: Former Smoker    Packs/day: 2.00    Years: 20.00    Pack years: 40.00  Types: Cigarettes, Cigars, Pipe    Quit date: 08/21/1968    Years since quitting: 50.9  . Smokeless tobacco: Never Used  . Tobacco comment: quit smoking in 1980  Substance Use Topics  . Alcohol use: No    Alcohol/week: 0.0 standard drinks  . Drug use: No     Allergies   Patient has no known allergies.   Review of Systems Review of Systems  Constitutional: Negative for chills and fever.  Respiratory: Negative for chest tightness and shortness of breath.   Cardiovascular: Positive for chest pain (left posterior rib pain).  Gastrointestinal: Negative for abdominal pain, diarrhea, nausea and vomiting.  Genitourinary: Negative for decreased urine volume, difficulty urinating, dysuria and hematuria.  Musculoskeletal: Negative for arthralgias, back pain, joint swelling and neck pain.  Skin: Negative for color change and wound.  Neurological: Negative for dizziness, syncope, weakness, numbness and headaches.     Physical Exam Updated Vital Signs BP (!) 146/72 (BP Location: Right Arm)   Pulse 87   Temp 98.3 F (36.8 C) (Oral)   Resp 17   Ht 6'  (1.829 m)   Wt 86.2 kg   SpO2 99%   BMI 25.77 kg/m   Physical Exam Vitals signs and nursing note reviewed.  Constitutional:      General: He is not in acute distress.    Appearance: Normal appearance. He is not ill-appearing.  HENT:     Head: Atraumatic.  Eyes:     Extraocular Movements: Extraocular movements intact.     Conjunctiva/sclera: Conjunctivae normal.     Pupils: Pupils are equal, round, and reactive to light.  Neck:     Musculoskeletal: Normal range of motion. No muscular tenderness.  Cardiovascular:     Rate and Rhythm: Normal rate and regular rhythm.     Pulses: Normal pulses.  Pulmonary:     Effort: Pulmonary effort is normal.     Breath sounds: Normal breath sounds.  Chest:     Chest wall: Tenderness (Focal tenderness to palpation of the lower left posterior ribs.  There is bony crepitus present.  As well as a small area of ecchymosis.  No hematoma.) present.  Abdominal:     General: There is no distension.     Palpations: Abdomen is soft.     Tenderness: There is no abdominal tenderness. There is no guarding.  Musculoskeletal: Normal range of motion.        General: No swelling.     Comments: No midline tenderness of the thoracic or lumbar spine.  No bony step-offs.  Skin:    General: Skin is warm.     Capillary Refill: Capillary refill takes less than 2 seconds.  Neurological:     General: No focal deficit present.     Mental Status: He is alert.     GCS: GCS eye subscore is 4. GCS verbal subscore is 5. GCS motor subscore is 6.     Sensory: Sensation is intact. No sensory deficit.     Motor: Motor function is intact. No weakness.     Comments: CN II-XII grossly intact.  Speech clear.        ED Treatments / Results  Labs (all labs ordered are listed, but only abnormal results are displayed) Labs Reviewed - No data to display  EKG None  Radiology Dg Ribs Unilateral W/chest Left  Result Date: 07/27/2019 CLINICAL DATA:  Fall with injury.  Left-sided rib pain. EXAM: LEFT RIBS AND CHEST - 3+ VIEW COMPARISON:  Chest  x-ray dated 09/25/2017 FINDINGS: The heart size is unchanged from prior study. There is a 1 cm density projecting over the right scapula which appears new from prior study. Aortic calcifications are noted. There is no pneumothorax. No large pleural effusion. There are acute minimally displaced fractures involving the posterior tenth and eleventh ribs on the left. IMPRESSION: 1. Acute minimally displaced fractures involving the posterior tenth and eleventh ribs on the left. 2. No pneumothorax or large pleural effusion. 3. There is a 1 cm density projecting over the right scapula of unknown clinical significance. A 4-6 week follow-up two-view chest x-ray is recommended to confirm stability or resolution of this finding. 4. Aortic calcifications. Electronically Signed   By: Constance Holster M.D.   On: 07/27/2019 22:57    Procedures Procedures (including critical care time)  Medications Ordered in ED Medications  HYDROcodone-acetaminophen (NORCO/VICODIN) 5-325 MG per tablet 1 tablet (1 tablet Oral Given 07/27/19 2209)     Initial Impression / Assessment and Plan / ED Course  I have reviewed the triage vital signs and the nursing notes.  Pertinent labs & imaging results that were available during my care of the patient were reviewed by me and considered in my medical decision making (see chart for details).        Patient with mechanical fall earlier this evening and injury to posterior left ribs.  There is bony crepitus on my exam with bruising to the affected area.  X-ray does show minimally displaced fractures of the posterior 10th and 11th ribs, no pneumothorax seen.  Patient reports pain improved after receiving oral hydrocodone.  Vitals reviewed.  No hypoxia, no respiratory distress noted.  Patient also seen by Dr. Rogene Houston care plan discussed.  Patient appears appropriate for discharge home, he will be given  incentive spirometer.  Patient will be going home with family member that stays with him.  He agrees to close outpatient follow-up with his PCP.  Return precautions were discussed.  Final Clinical Impressions(s) / ED Diagnoses   Final diagnoses:  Closed fracture of multiple ribs of left side, initial encounter    ED Discharge Orders    None       Kem Parkinson, PA-C 07/28/19 0008    Fredia Sorrow, MD 07/30/19 1515

## 2019-07-27 NOTE — ED Triage Notes (Signed)
Pt reports he fell on his carport tonight, denies hitting his head or LOC, pt c/o left sided rib pain only

## 2019-07-28 DIAGNOSIS — S2242XA Multiple fractures of ribs, left side, initial encounter for closed fracture: Secondary | ICD-10-CM | POA: Diagnosis not present

## 2019-07-28 MED ORDER — HYDROCODONE-ACETAMINOPHEN 5-325 MG PO TABS: ORAL_TABLET | ORAL | 0 refills | Status: DC

## 2019-07-28 NOTE — Discharge Instructions (Addendum)
Use the incentive spirometer as directed.  Is important that you take deep breaths and cough several times a day.  You may use a small pillow or folded towel held firmly to your side as you cough, sneeze, or take deep breaths.  This may help control the pain.  You may also find that sleeping in a recliner may help as well.  Be sure to follow-up with your primary doctor later this week for recheck.  Return to the ER for any worsening symptoms such as increasing pain or sudden shortness of breath.

## 2019-07-28 NOTE — ED Notes (Signed)
Incentive spirometry given to pt- instructions on use provided by Herbie Baltimore, RT

## 2019-07-29 MED FILL — Hydrocodone-Acetaminophen Tab 5-325 MG: ORAL | Qty: 6 | Status: AC

## 2019-09-15 DIAGNOSIS — R419 Unspecified symptoms and signs involving cognitive functions and awareness: Secondary | ICD-10-CM | POA: Diagnosis not present

## 2019-09-15 DIAGNOSIS — Z710 Person encountering health services to consult on behalf of another person: Secondary | ICD-10-CM | POA: Diagnosis not present

## 2019-09-16 DIAGNOSIS — E782 Mixed hyperlipidemia: Secondary | ICD-10-CM | POA: Diagnosis not present

## 2019-09-16 DIAGNOSIS — I10 Essential (primary) hypertension: Secondary | ICD-10-CM | POA: Diagnosis not present

## 2019-09-16 DIAGNOSIS — R7301 Impaired fasting glucose: Secondary | ICD-10-CM | POA: Diagnosis not present

## 2019-09-16 DIAGNOSIS — D509 Iron deficiency anemia, unspecified: Secondary | ICD-10-CM | POA: Diagnosis not present

## 2019-09-19 DIAGNOSIS — M353 Polymyalgia rheumatica: Secondary | ICD-10-CM | POA: Diagnosis not present

## 2019-09-19 DIAGNOSIS — I10 Essential (primary) hypertension: Secondary | ICD-10-CM | POA: Diagnosis not present

## 2019-09-19 DIAGNOSIS — R531 Weakness: Secondary | ICD-10-CM | POA: Diagnosis not present

## 2019-09-19 DIAGNOSIS — F330 Major depressive disorder, recurrent, mild: Secondary | ICD-10-CM | POA: Diagnosis not present

## 2019-09-22 DIAGNOSIS — R531 Weakness: Secondary | ICD-10-CM | POA: Diagnosis not present

## 2019-09-22 DIAGNOSIS — I10 Essential (primary) hypertension: Secondary | ICD-10-CM | POA: Diagnosis not present

## 2019-09-22 DIAGNOSIS — R5383 Other fatigue: Secondary | ICD-10-CM | POA: Diagnosis not present

## 2019-09-22 DIAGNOSIS — D519 Vitamin B12 deficiency anemia, unspecified: Secondary | ICD-10-CM | POA: Diagnosis not present

## 2019-09-22 DIAGNOSIS — R7301 Impaired fasting glucose: Secondary | ICD-10-CM | POA: Diagnosis not present

## 2019-09-22 DIAGNOSIS — C670 Malignant neoplasm of trigone of bladder: Secondary | ICD-10-CM | POA: Diagnosis not present

## 2019-09-22 DIAGNOSIS — I25118 Atherosclerotic heart disease of native coronary artery with other forms of angina pectoris: Secondary | ICD-10-CM | POA: Diagnosis not present

## 2019-09-22 DIAGNOSIS — D649 Anemia, unspecified: Secondary | ICD-10-CM | POA: Diagnosis not present

## 2019-09-22 DIAGNOSIS — I480 Paroxysmal atrial fibrillation: Secondary | ICD-10-CM | POA: Diagnosis not present

## 2019-09-22 DIAGNOSIS — I350 Nonrheumatic aortic (valve) stenosis: Secondary | ICD-10-CM | POA: Diagnosis not present

## 2019-09-22 DIAGNOSIS — E782 Mixed hyperlipidemia: Secondary | ICD-10-CM | POA: Diagnosis not present

## 2019-09-22 DIAGNOSIS — M353 Polymyalgia rheumatica: Secondary | ICD-10-CM | POA: Diagnosis not present

## 2019-09-29 DIAGNOSIS — I25118 Atherosclerotic heart disease of native coronary artery with other forms of angina pectoris: Secondary | ICD-10-CM | POA: Diagnosis not present

## 2019-09-29 DIAGNOSIS — I480 Paroxysmal atrial fibrillation: Secondary | ICD-10-CM | POA: Diagnosis not present

## 2019-09-29 DIAGNOSIS — D649 Anemia, unspecified: Secondary | ICD-10-CM | POA: Diagnosis not present

## 2019-09-29 DIAGNOSIS — D519 Vitamin B12 deficiency anemia, unspecified: Secondary | ICD-10-CM | POA: Diagnosis not present

## 2019-09-29 DIAGNOSIS — R5383 Other fatigue: Secondary | ICD-10-CM | POA: Diagnosis not present

## 2019-09-29 DIAGNOSIS — M353 Polymyalgia rheumatica: Secondary | ICD-10-CM | POA: Diagnosis not present

## 2019-09-29 DIAGNOSIS — I10 Essential (primary) hypertension: Secondary | ICD-10-CM | POA: Diagnosis not present

## 2019-09-29 DIAGNOSIS — C670 Malignant neoplasm of trigone of bladder: Secondary | ICD-10-CM | POA: Diagnosis not present

## 2019-09-29 DIAGNOSIS — E782 Mixed hyperlipidemia: Secondary | ICD-10-CM | POA: Diagnosis not present

## 2019-09-29 DIAGNOSIS — R7301 Impaired fasting glucose: Secondary | ICD-10-CM | POA: Diagnosis not present

## 2019-09-29 DIAGNOSIS — I350 Nonrheumatic aortic (valve) stenosis: Secondary | ICD-10-CM | POA: Diagnosis not present

## 2019-09-29 DIAGNOSIS — R531 Weakness: Secondary | ICD-10-CM | POA: Diagnosis not present

## 2019-10-20 ENCOUNTER — Encounter: Payer: Self-pay | Admitting: Cardiovascular Disease

## 2019-10-20 ENCOUNTER — Telehealth (INDEPENDENT_AMBULATORY_CARE_PROVIDER_SITE_OTHER): Payer: Medicare Other | Admitting: Cardiovascular Disease

## 2019-10-20 VITALS — BP 169/63 | Ht 72.0 in | Wt 200.0 lb

## 2019-10-20 DIAGNOSIS — I10 Essential (primary) hypertension: Secondary | ICD-10-CM

## 2019-10-20 DIAGNOSIS — I25118 Atherosclerotic heart disease of native coronary artery with other forms of angina pectoris: Secondary | ICD-10-CM | POA: Diagnosis not present

## 2019-10-20 DIAGNOSIS — Z8551 Personal history of malignant neoplasm of bladder: Secondary | ICD-10-CM

## 2019-10-20 DIAGNOSIS — Z955 Presence of coronary angioplasty implant and graft: Secondary | ICD-10-CM

## 2019-10-20 DIAGNOSIS — I480 Paroxysmal atrial fibrillation: Secondary | ICD-10-CM

## 2019-10-20 DIAGNOSIS — I350 Nonrheumatic aortic (valve) stenosis: Secondary | ICD-10-CM | POA: Diagnosis not present

## 2019-10-20 DIAGNOSIS — I4892 Unspecified atrial flutter: Secondary | ICD-10-CM | POA: Diagnosis not present

## 2019-10-20 MED ORDER — LOSARTAN POTASSIUM 50 MG PO TABS: 75.0000 mg | ORAL_TABLET | Freq: Every day | ORAL | 6 refills | Status: DC

## 2019-10-20 NOTE — Addendum Note (Signed)
Addended by: Laurine Blazer on: 10/20/2019 10:50 AM   Modules accepted: Orders

## 2019-10-20 NOTE — Patient Instructions (Addendum)
Medication Instructions:   Increase Losartan to 75mg  daily - new prescription sent to [facility] today.    Continue all other medications.    Labwork: none  Testing/Procedures: none  Follow-Up: 6 months   Any Other Special Instructions Will Be Listed Below (If Applicable).  If you need a refill on your cardiac medications before your next appointment, please call your pharmacy.

## 2019-10-20 NOTE — Progress Notes (Signed)
Virtual Visit via Telephone Note   This visit type was conducted due to national recommendations for restrictions regarding the COVID-19 Pandemic (e.g. social distancing) in an effort to limit this patient's exposure and mitigate transmission in our community.  Due to his co-morbid illnesses, this patient is at least at moderate risk for complications without adequate follow up.  This format is felt to be most appropriate for this patient at this time.  The patient did not have access to video technology/had technical difficulties with video requiring transitioning to audio format only (telephone).  All issues noted in this document were discussed and addressed.  No physical exam could be performed with this format.  Please refer to the patient's chart for his  consent to telehealth for [facility].   Date:  10/20/2019   ID:  Manuel Le, DOB 12-19-1926, MRN [id number]  Patient Location: Home Provider Location: Office  PCP:  Celene Squibb, MD  Cardiologist:  Kate Sable, MD  Electrophysiologist:  None   Evaluation Performed:  Follow-Up Visit  Chief Complaint:  CAD  History of Present Illness:    Manuel Le is a 86 y.o. male with coronary artery disease. He underwent bare-metal stent placement to the ostium of the RCA on 07/08/14. He also has a history of atrial fibrillation and flutter. He underwent transurethral resection for bladder tumor recurrence on 01/20/15.   The patient denies any symptoms of chest pain, palpitations, shortness of breath, lightheadedness, dizziness, leg swelling, orthopnea, PND, and syncope.  He denies hematuria, hematochezia, and melena.  The patient does not have symptoms concerning for COVID-19 infection (fever, chills, cough, or new shortness of breath).    Past Medical History:  Diagnosis Date  . Arthritis    OSTEO  . Atrial fibrillation and flutter ([place])   . Bladder cancer ([place])   . Coronary artery disease   . Dysrhythmia   .  Exertional angina ([place]) 07/08/2014  . H/O urinary frequency   . Hematuria   . Hypertension   . Iron deficiency anemia   . Kidney stones    hx of   . Mild obstructive sleep apnea    no cpap  . S/P coronary artery stent placement 07/24/2014  . Skin cancer   . Weakness    Past Surgical History:  Procedure Laterality Date  . CARDIAC CATHETERIZATION    . CARDIOVASCULAR STRESS TEST  12-15-2013  DR Kate Sable ([place])   MILD - MODERATE PERI-INFARCT ISCHEMIA  OF INFERIOR WALL, MID-INFEROSEPTAL WALL, MID-INFEROLATERAL WALL, & BASAL INFERIOR WALL/   NORMAL LVF /  EF 59%/  INTERMEDIATE RISK STUDY  . COLONOSCOPY  07/04/2012   Procedure: COLONOSCOPY;  Surgeon: Rogene Houston, MD;  [facility];  Service: Endoscopy;  Laterality: N/A;  320  . CORONARY STENT PLACEMENT  07/08/2014   PTCA of RCA   DR COOPER  . CYSTOSCOPY W/ RETROGRADES Bilateral 12/31/2013   Procedure: CYSTOSCOPY WITH RETROGRADE PYELOGRAM;  Surgeon: Alexis Frock, MD;  [facility];  Service: Urology;  Laterality: Bilateral;  . CYSTOSCOPY W/ RETROGRADES Bilateral 01/20/2015   Procedure: CYSTOSCOPY WITH RETROGRADE PYELOGRAM;  Surgeon: Alexis Frock, MD;  [facility];  Service: Urology;  Laterality: Bilateral;  . ESOPHAGOGASTRODUODENOSCOPY  07/04/2012   Procedure: ESOPHAGOGASTRODUODENOSCOPY (EGD);  Surgeon: Rogene Houston, MD;  [facility];  Service: Endoscopy;  Laterality: N/A;  . EYE SURGERY     past cataract surgery bilat  . HERNIA REPAIR Right   .  LEFT HEART CATHETERIZATION WITH CORONARY ANGIOGRAM N/A 07/08/2014   Procedure: LEFT HEART CATHETERIZATION WITH CORONARY ANGIOGRAM;  Surgeon: Blane Ohara, MD;  Location[facility] CATH LAB;  Service: Cardiovascular;  Laterality: N/A;  . PERCUTANEOUS CORONARY STENT INTERVENTION (PCI-S)  07/08/2014   Procedure: PERCUTANEOUS CORONARY STENT INTERVENTION (PCI-S);  Surgeon: Blane Ohara, MD;  Location: [facility] CATH LAB;  Service: Cardiovascular;;    . TOTAL KNEE ARTHROPLASTY Bilateral 2000  &  2003?  . TRANSTHORACIC ECHOCARDIOGRAM  09-11-2013   MODERATE LVH/  EF 55-60%/  MILD MR & TR/  MILD TO MODERATE CALCIFIED AV WITHOUT STENOSIS/  MILD LAE/   MODERATE PR  . TRANSURETHRAL RESECTION OF BLADDER TUMOR N/A 01/20/2015   Procedure: TRANSURETHRAL RESECTION OF BLADDER TUMOR (TURBT);  Surgeon: Alexis Frock, MD;  [facility];  Service: Urology;  Laterality: N/A;  . TRANSURETHRAL RESECTION OF BLADDER TUMOR WITH GYRUS (TURBT-GYRUS) N/A 12/31/2013   Procedure: TRANSURETHRAL RESECTION OF BLADDER TUMOR WITH GYRUS (TURBT-GYRUS);  Surgeon: Alexis Frock, MD;  [facility];  Service: Urology;  Laterality: N/A;  . ZENKER'S DIVERTICULECTOMY N/A 09/09/2015   Procedure: ENDOSCOPIC ZENKER'S DIVERTICULECTOMY;  Surgeon: Izora Gala, MD;  Location: [place];  Service: ENT;  Laterality: N/A;  . ZENKER'S DIVERTICULECTOMY ENDOSCOPIC  09/09/2015     Current Meds  Medication Sig  . HYDROcodone-acetaminophen (NORCO/VICODIN) 5-325 MG tablet Take 1 tablet by mouth every 4 (four) hours as needed.     Allergies:   Patient has no known allergies.   Social History   Tobacco Use  . Smoking status: Former Smoker    Packs/day: 2.00    Years: 20.00    Pack years: 40.00    Types: Cigarettes, Cigars, Pipe    Quit date: 08/21/1968    Years since quitting: 51.1  . Smokeless tobacco: Never Used  . Tobacco comment: quit smoking in 1980  Substance Use Topics  . Alcohol use: No    Alcohol/week: 0.0 standard drinks  . Drug use: No     Family Hx: The patient's family history is not on file.  ROS:   Please see the history of present illness.     All other systems reviewed and are negative.   Prior CV studies:   The following studies were reviewed today:  Echocardiogram 09/26/2017 showed normal left ventricular systolic function and regional wall motion, LVEF 55 to 60%, mild LVH, moderately calcified aortic annulus and moderately calcified leaflets with  probable mild aortic stenosis, and moderate left atrial dilatation.  Nuclear stress test 09/20/2018:   RBBB with PVC's seen.  Defect 1: There is a medium defect of moderate severity present in the mid inferoseptal and mid inferior location. There is excessive soft tissue attenuation artifact making rest/stress image comparison difficult. Wall motion appears grossly normal. Some evidence of reverse redistribution.  This is a low risk study. No significant ischemic zones.  Nuclear stress EF: 53%.     Labs/Other Tests and Data Reviewed:    EKG:  No ECG reviewed.  Recent Labs: No results found for requested labs within last 8760 hours.   Recent Lipid Panel No results found for: CHOL, TRIG, HDL, CHOLHDL, LDLCALC, LDLDIRECT  Wt Readings from Last 3 Encounters:  10/20/19 200 lb (90.7 kg)  07/27/19 190 lb (86.2 kg)  03/06/19 190 lb (86.2 kg)     Objective:    Vital Signs:  BP (!) 169/63   Ht 6' (1.829 m)   Wt 200 lb (90.7 kg)   BMI 27.12 kg/m  VITAL SIGNS:  reviewed  ASSESSMENT & PLAN:    1. CAD s/p BMS to RCA: Symptomatically stable.  He underwent a low risk nuclear stress test on 09/20/2018.  ContinueASA 81 mg daily.  He is no longer on beta-blockers or statin therapy.  I will aim to control blood pressure.  2. Paroxysmal atrial fibrillation/flutter:Symptomatically stable. Warfarin was previously discontinued due to bladder cancer and issues with hematuria. He does have a history of frequent falls and walks with awalker. I will not pursue the reinitiation of anticoagulation. He remains on aspirin primarily for secondary prevention of coronary artery disease. He is no longer on a beta-blocker.  3. Essential hypertension:Blood pressure is significantly elevated.    He is no longer on carvedilol nor amlodipine.  I will increase losartan 75 mg daily.  4. Bladder cancer: s/p resection on 01/20/15.No further issues with hematuria.  5. Aortic stenosis: Mild in  severity by echocardiogram on 09/26/2017.    COVID-19 Education: The signs and symptoms of COVID-19 were discussed with the patient and how to seek care for testing (follow up with PCP or arrange E-visit).  The importance of social distancing was discussed today.  Time:   Today, I have spent 15 minutes with the patient with telehealth technology discussing the above problems.     Medication Adjustments/Labs and Tests Ordered: Current medicines are reviewed at length with the patient today.  Concerns regarding medicines are outlined above.   Tests Ordered: No orders of the defined types were placed in this encounter.   Medication Changes: No orders of the defined types were placed in this encounter.   Follow Up:  Virtual Visit  in 6 month(s)  Signed, Kate Sable, MD  10/20/2019 10:27 AM    [place]

## 2020-01-02 ENCOUNTER — Other Ambulatory Visit: Payer: Self-pay | Admitting: Cardiovascular Disease

## 2020-01-09 DIAGNOSIS — I480 Paroxysmal atrial fibrillation: Secondary | ICD-10-CM | POA: Diagnosis not present

## 2020-01-09 DIAGNOSIS — I10 Essential (primary) hypertension: Secondary | ICD-10-CM | POA: Diagnosis not present

## 2020-01-09 DIAGNOSIS — D519 Vitamin B12 deficiency anemia, unspecified: Secondary | ICD-10-CM | POA: Diagnosis not present

## 2020-01-09 DIAGNOSIS — D72829 Elevated white blood cell count, unspecified: Secondary | ICD-10-CM | POA: Diagnosis not present

## 2020-01-09 DIAGNOSIS — I25118 Atherosclerotic heart disease of native coronary artery with other forms of angina pectoris: Secondary | ICD-10-CM | POA: Diagnosis not present

## 2020-01-09 DIAGNOSIS — E782 Mixed hyperlipidemia: Secondary | ICD-10-CM | POA: Diagnosis not present

## 2020-01-09 DIAGNOSIS — C670 Malignant neoplasm of trigone of bladder: Secondary | ICD-10-CM | POA: Diagnosis not present

## 2020-01-09 DIAGNOSIS — M353 Polymyalgia rheumatica: Secondary | ICD-10-CM | POA: Diagnosis not present

## 2020-01-09 DIAGNOSIS — I350 Nonrheumatic aortic (valve) stenosis: Secondary | ICD-10-CM | POA: Diagnosis not present

## 2020-02-04 DIAGNOSIS — D72829 Elevated white blood cell count, unspecified: Secondary | ICD-10-CM | POA: Diagnosis not present

## 2020-02-04 DIAGNOSIS — F330 Major depressive disorder, recurrent, mild: Secondary | ICD-10-CM | POA: Diagnosis not present

## 2020-02-04 DIAGNOSIS — E782 Mixed hyperlipidemia: Secondary | ICD-10-CM | POA: Diagnosis not present

## 2020-02-04 DIAGNOSIS — C670 Malignant neoplasm of trigone of bladder: Secondary | ICD-10-CM | POA: Diagnosis not present

## 2020-02-04 DIAGNOSIS — C679 Malignant neoplasm of bladder, unspecified: Secondary | ICD-10-CM | POA: Diagnosis not present

## 2020-02-04 DIAGNOSIS — D519 Vitamin B12 deficiency anemia, unspecified: Secondary | ICD-10-CM | POA: Diagnosis not present

## 2020-02-04 DIAGNOSIS — I2510 Atherosclerotic heart disease of native coronary artery without angina pectoris: Secondary | ICD-10-CM | POA: Diagnosis not present

## 2020-02-04 DIAGNOSIS — I350 Nonrheumatic aortic (valve) stenosis: Secondary | ICD-10-CM | POA: Diagnosis not present

## 2020-02-04 DIAGNOSIS — I10 Essential (primary) hypertension: Secondary | ICD-10-CM | POA: Diagnosis not present

## 2020-02-04 DIAGNOSIS — I25118 Atherosclerotic heart disease of native coronary artery with other forms of angina pectoris: Secondary | ICD-10-CM | POA: Diagnosis not present

## 2020-02-04 DIAGNOSIS — D509 Iron deficiency anemia, unspecified: Secondary | ICD-10-CM | POA: Diagnosis not present

## 2020-02-04 DIAGNOSIS — D649 Anemia, unspecified: Secondary | ICD-10-CM | POA: Diagnosis not present

## 2020-03-23 DIAGNOSIS — R0602 Shortness of breath: Secondary | ICD-10-CM | POA: Diagnosis not present

## 2020-03-23 DIAGNOSIS — Z634 Disappearance and death of family member: Secondary | ICD-10-CM | POA: Diagnosis not present

## 2020-03-23 DIAGNOSIS — R531 Weakness: Secondary | ICD-10-CM | POA: Diagnosis not present

## 2020-03-23 DIAGNOSIS — I480 Paroxysmal atrial fibrillation: Secondary | ICD-10-CM | POA: Diagnosis not present

## 2020-03-23 DIAGNOSIS — I25118 Atherosclerotic heart disease of native coronary artery with other forms of angina pectoris: Secondary | ICD-10-CM | POA: Diagnosis not present

## 2020-03-23 DIAGNOSIS — R5383 Other fatigue: Secondary | ICD-10-CM | POA: Diagnosis not present

## 2020-03-23 DIAGNOSIS — I350 Nonrheumatic aortic (valve) stenosis: Secondary | ICD-10-CM | POA: Diagnosis not present

## 2020-03-23 DIAGNOSIS — F331 Major depressive disorder, recurrent, moderate: Secondary | ICD-10-CM | POA: Diagnosis not present

## 2020-03-23 DIAGNOSIS — I10 Essential (primary) hypertension: Secondary | ICD-10-CM | POA: Diagnosis not present

## 2020-03-29 ENCOUNTER — Telehealth: Payer: Self-pay | Admitting: Student

## 2020-03-29 NOTE — Telephone Encounter (Signed)
  Patient Consent for Virtual Visit         Manuel Le has provided verbal consent on 03/29/2020 for a virtual visit (video or telephone).   CONSENT FOR VIRTUAL VISIT FOR:  Manuel Le  By participating in this virtual visit I agree to the following:  I hereby voluntarily request, consent and authorize [facility] and its employed or contracted physicians, physician assistants, nurse practitioners or other licensed health care professionals (the Practitioner), to provide me with telemedicine health care services (the "Services") as deemed necessary by the treating Practitioner. I acknowledge and consent to receive the Services by the Practitioner via telemedicine. I understand that the telemedicine visit will involve communicating with the Practitioner through live audiovisual communication technology and the disclosure of certain medical information by electronic transmission. I acknowledge that I have been given the opportunity to request an in-person assessment or other available alternative prior to the telemedicine visit and am voluntarily participating in the telemedicine visit.  I understand that I have the right to withhold or withdraw my consent to the use of telemedicine in the course of my care at any time, without affecting my right to future care or treatment, and that the Practitioner or I may terminate the telemedicine visit at any time. I understand that I have the right to inspect all information obtained and/or recorded in the course of the telemedicine visit and may receive copies of available information for a reasonable fee.  I understand that some of the potential risks of receiving the Services via telemedicine include:  Marland Kitchen Delay or interruption in medical evaluation due to technological equipment failure or disruption; . Information transmitted may not be sufficient (e.g. poor resolution of images) to allow for appropriate medical decision making by the Practitioner;  and/or  . In rare instances, security protocols could fail, causing a breach of personal health information.  Furthermore, I acknowledge that it is my responsibility to provide information about my medical history, conditions and care that is complete and accurate to the best of my ability. I acknowledge that Practitioner's advice, recommendations, and/or decision may be based on factors not within their control, such as incomplete or inaccurate data provided by me or distortions of diagnostic images or specimens that may result from electronic transmissions. I understand that the practice of medicine is not an exact science and that Practitioner makes no warranties or guarantees regarding treatment outcomes. I acknowledge that a copy of this consent can be made available to me via my patient portal ([facility]), or I can request a printed copy by calling the office of [facility].    I understand that my insurance will be billed for this visit.   I have read or had this consent read to me. . I understand the contents of this consent, which adequately explains the benefits and risks of the Services being provided via telemedicine.  . I have been provided ample opportunity to ask questions regarding this consent and the Services and have had my questions answered to my satisfaction. . I give my informed consent for the services to be provided through the use of telemedicine in my medical care

## 2020-04-12 DIAGNOSIS — Z0001 Encounter for general adult medical examination with abnormal findings: Secondary | ICD-10-CM | POA: Diagnosis not present

## 2020-04-12 DIAGNOSIS — I25118 Atherosclerotic heart disease of native coronary artery with other forms of angina pectoris: Secondary | ICD-10-CM | POA: Diagnosis not present

## 2020-04-12 DIAGNOSIS — Z634 Disappearance and death of family member: Secondary | ICD-10-CM | POA: Diagnosis not present

## 2020-04-12 DIAGNOSIS — R0602 Shortness of breath: Secondary | ICD-10-CM | POA: Diagnosis not present

## 2020-04-12 DIAGNOSIS — R5383 Other fatigue: Secondary | ICD-10-CM | POA: Diagnosis not present

## 2020-04-12 DIAGNOSIS — I10 Essential (primary) hypertension: Secondary | ICD-10-CM | POA: Diagnosis not present

## 2020-04-12 DIAGNOSIS — I480 Paroxysmal atrial fibrillation: Secondary | ICD-10-CM | POA: Diagnosis not present

## 2020-04-12 DIAGNOSIS — R531 Weakness: Secondary | ICD-10-CM | POA: Diagnosis not present

## 2020-04-12 DIAGNOSIS — F331 Major depressive disorder, recurrent, moderate: Secondary | ICD-10-CM | POA: Diagnosis not present

## 2020-04-12 DIAGNOSIS — I350 Nonrheumatic aortic (valve) stenosis: Secondary | ICD-10-CM | POA: Diagnosis not present

## 2020-04-21 ENCOUNTER — Telehealth: Payer: PRIVATE HEALTH INSURANCE | Admitting: Cardiovascular Disease

## 2020-05-13 ENCOUNTER — Encounter: Payer: Medicare Other | Admitting: Family Medicine

## 2020-05-13 ENCOUNTER — Telehealth: Payer: Medicare Other | Admitting: Student

## 2020-05-14 DIAGNOSIS — R5382 Chronic fatigue, unspecified: Secondary | ICD-10-CM | POA: Diagnosis not present

## 2020-05-14 DIAGNOSIS — I10 Essential (primary) hypertension: Secondary | ICD-10-CM | POA: Diagnosis not present

## 2020-05-14 DIAGNOSIS — L97529 Non-pressure chronic ulcer of other part of left foot with unspecified severity: Secondary | ICD-10-CM | POA: Diagnosis not present

## 2020-05-14 DIAGNOSIS — R7301 Impaired fasting glucose: Secondary | ICD-10-CM | POA: Diagnosis not present

## 2020-05-14 DIAGNOSIS — C679 Malignant neoplasm of bladder, unspecified: Secondary | ICD-10-CM | POA: Diagnosis not present

## 2020-05-14 DIAGNOSIS — M353 Polymyalgia rheumatica: Secondary | ICD-10-CM | POA: Diagnosis not present

## 2020-05-14 DIAGNOSIS — R05 Cough: Secondary | ICD-10-CM | POA: Diagnosis not present

## 2020-05-14 DIAGNOSIS — R1310 Dysphagia, unspecified: Secondary | ICD-10-CM | POA: Diagnosis not present

## 2020-05-14 DIAGNOSIS — I499 Cardiac arrhythmia, unspecified: Secondary | ICD-10-CM | POA: Diagnosis not present

## 2020-05-14 DIAGNOSIS — M6281 Muscle weakness (generalized): Secondary | ICD-10-CM | POA: Diagnosis not present

## 2020-05-14 DIAGNOSIS — J069 Acute upper respiratory infection, unspecified: Secondary | ICD-10-CM | POA: Diagnosis not present

## 2020-05-14 DIAGNOSIS — D509 Iron deficiency anemia, unspecified: Secondary | ICD-10-CM | POA: Diagnosis not present

## 2020-05-17 DIAGNOSIS — I480 Paroxysmal atrial fibrillation: Secondary | ICD-10-CM | POA: Diagnosis not present

## 2020-05-17 DIAGNOSIS — R0602 Shortness of breath: Secondary | ICD-10-CM | POA: Diagnosis not present

## 2020-05-17 DIAGNOSIS — Z634 Disappearance and death of family member: Secondary | ICD-10-CM | POA: Diagnosis not present

## 2020-05-17 DIAGNOSIS — I25118 Atherosclerotic heart disease of native coronary artery with other forms of angina pectoris: Secondary | ICD-10-CM | POA: Diagnosis not present

## 2020-05-17 DIAGNOSIS — I10 Essential (primary) hypertension: Secondary | ICD-10-CM | POA: Diagnosis not present

## 2020-05-17 DIAGNOSIS — I350 Nonrheumatic aortic (valve) stenosis: Secondary | ICD-10-CM | POA: Diagnosis not present

## 2020-05-17 DIAGNOSIS — F331 Major depressive disorder, recurrent, moderate: Secondary | ICD-10-CM | POA: Diagnosis not present

## 2020-05-17 DIAGNOSIS — R531 Weakness: Secondary | ICD-10-CM | POA: Diagnosis not present

## 2020-05-17 DIAGNOSIS — R5383 Other fatigue: Secondary | ICD-10-CM | POA: Diagnosis not present

## 2020-05-19 DIAGNOSIS — C670 Malignant neoplasm of trigone of bladder: Secondary | ICD-10-CM | POA: Diagnosis not present

## 2020-05-19 DIAGNOSIS — I480 Paroxysmal atrial fibrillation: Secondary | ICD-10-CM | POA: Diagnosis not present

## 2020-05-19 DIAGNOSIS — I10 Essential (primary) hypertension: Secondary | ICD-10-CM | POA: Diagnosis not present

## 2020-05-19 DIAGNOSIS — D649 Anemia, unspecified: Secondary | ICD-10-CM | POA: Diagnosis not present

## 2020-05-19 DIAGNOSIS — R5383 Other fatigue: Secondary | ICD-10-CM | POA: Diagnosis not present

## 2020-05-19 DIAGNOSIS — D519 Vitamin B12 deficiency anemia, unspecified: Secondary | ICD-10-CM | POA: Diagnosis not present

## 2020-05-19 DIAGNOSIS — M353 Polymyalgia rheumatica: Secondary | ICD-10-CM | POA: Diagnosis not present

## 2020-05-19 DIAGNOSIS — R531 Weakness: Secondary | ICD-10-CM | POA: Diagnosis not present

## 2020-05-19 DIAGNOSIS — R7301 Impaired fasting glucose: Secondary | ICD-10-CM | POA: Diagnosis not present

## 2020-05-19 DIAGNOSIS — E782 Mixed hyperlipidemia: Secondary | ICD-10-CM | POA: Diagnosis not present

## 2020-05-19 DIAGNOSIS — I350 Nonrheumatic aortic (valve) stenosis: Secondary | ICD-10-CM | POA: Diagnosis not present

## 2020-05-19 DIAGNOSIS — I25118 Atherosclerotic heart disease of native coronary artery with other forms of angina pectoris: Secondary | ICD-10-CM | POA: Diagnosis not present

## 2020-05-21 ENCOUNTER — Encounter: Payer: Self-pay | Admitting: Family Medicine

## 2020-05-21 ENCOUNTER — Telehealth (INDEPENDENT_AMBULATORY_CARE_PROVIDER_SITE_OTHER): Payer: Medicare Other | Admitting: Family Medicine

## 2020-05-21 VITALS — Ht 72.0 in | Wt 214.0 lb

## 2020-05-21 DIAGNOSIS — I350 Nonrheumatic aortic (valve) stenosis: Secondary | ICD-10-CM | POA: Diagnosis not present

## 2020-05-21 DIAGNOSIS — I2510 Atherosclerotic heart disease of native coronary artery without angina pectoris: Secondary | ICD-10-CM

## 2020-05-21 DIAGNOSIS — I10 Essential (primary) hypertension: Secondary | ICD-10-CM | POA: Diagnosis not present

## 2020-05-21 DIAGNOSIS — I480 Paroxysmal atrial fibrillation: Secondary | ICD-10-CM

## 2020-05-21 NOTE — Patient Instructions (Addendum)

## 2020-05-21 NOTE — Progress Notes (Signed)
This was a telemedicine visit Patient location: Home Provider location: Office    Cardiology Office Note  Date: 05/21/2020   ID: Manuel Le, DOB 1927-06-19, MRN [id number]  PCP:  Celene Squibb, MD  Cardiologist:  No primary care provider on file. Electrophysiologist:  None   Chief Complaint: CAD  History of Present Illness: Manuel Le is a 86 y.o. male with a history of CAD, PAF/flutter, HTN, aortic stenosis, bladder CA status post resection 2016.  Last encounter with Dr. Bronson Ing 10/20/2019 via telemedicine visit.  His CAD was symptomatically stable he did undergo 90 low risk stress test January 2020.  Continue aspirin 81 mg.  He was no longer on beta-blockers or statin therapy.  PAF/flutter symptomatically stable, Coumadin previously DC'd due to bladder cancer issues and hematuria.  History of frequent falls and using a walker to ambulate.  Aortic stenosis was mild severity in 2019.  Blood pressure was significantly elevated.  Losartan was increased to 75 mg daily.  Past Medical History:  Diagnosis Date  . Arthritis    OSTEO  . Atrial fibrillation and flutter ([place])   . Bladder cancer ([place])   . Coronary artery disease   . Dysrhythmia   . Exertional angina ([place]) 07/08/2014  . H/O urinary frequency   . Hematuria   . Hypertension   . Iron deficiency anemia   . Kidney stones    hx of   . Mild obstructive sleep apnea    no cpap  . S/P coronary artery stent placement 07/24/2014  . Skin cancer   . Weakness     Past Surgical History:  Procedure Laterality Date  . CARDIAC CATHETERIZATION    . CARDIOVASCULAR STRESS TEST  12-15-2013  DR Kate Sable ([place])   MILD - MODERATE PERI-INFARCT ISCHEMIA  OF INFERIOR WALL, MID-INFEROSEPTAL WALL, MID-INFEROLATERAL WALL, & BASAL INFERIOR WALL/   NORMAL LVF /  EF 59%/  INTERMEDIATE RISK STUDY  . COLONOSCOPY  07/04/2012   Procedure: COLONOSCOPY;  Surgeon: Rogene Houston, MD;  Location: [facility];  Service:  Endoscopy;  Laterality: N/A;  320  . CORONARY STENT PLACEMENT  07/08/2014   PTCA of RCA   DR COOPER  . CYSTOSCOPY W/ RETROGRADES Bilateral 12/31/2013   Procedure: CYSTOSCOPY WITH RETROGRADE PYELOGRAM;  Surgeon: Alexis Frock, MD;  Location: [facility];  Service: Urology;  Laterality: Bilateral;  . CYSTOSCOPY W/ RETROGRADES Bilateral 01/20/2015   Procedure: CYSTOSCOPY WITH RETROGRADE PYELOGRAM;  Surgeon: Alexis Frock, MD;  Location: [facility];  Service: Urology;  Laterality: Bilateral;  . ESOPHAGOGASTRODUODENOSCOPY  07/04/2012   Procedure: ESOPHAGOGASTRODUODENOSCOPY (EGD);  Surgeon: Rogene Houston, MD;  Location: [facility];  Service: Endoscopy;  Laterality: N/A;  . EYE SURGERY     past cataract surgery bilat  . HERNIA REPAIR Right   . LEFT HEART CATHETERIZATION WITH CORONARY ANGIOGRAM N/A 07/08/2014   Procedure: LEFT HEART CATHETERIZATION WITH CORONARY ANGIOGRAM;  Surgeon: Blane Ohara, MD;  Location: [facility] CATH LAB;  Service: Cardiovascular;  Laterality: N/A;  . PERCUTANEOUS CORONARY STENT INTERVENTION (PCI-S)  07/08/2014   Procedure: PERCUTANEOUS CORONARY STENT INTERVENTION (PCI-S);  Surgeon: Blane Ohara, MD;  Location: [facility] CATH LAB;  Service: Cardiovascular;;  . TOTAL KNEE ARTHROPLASTY Bilateral 2000  &  2003?  . TRANSTHORACIC ECHOCARDIOGRAM  09-11-2013   MODERATE LVH/  EF 55-60%/  MILD MR & TR/  MILD TO MODERATE CALCIFIED AV WITHOUT STENOSIS/  MILD LAE/   MODERATE PR  . TRANSURETHRAL RESECTION OF BLADDER TUMOR  N/A 01/20/2015   Procedure: TRANSURETHRAL RESECTION OF BLADDER TUMOR (TURBT);  Surgeon: Alexis Frock, MD;  Location: [facility];  Service: Urology;  Laterality: N/A;  . TRANSURETHRAL RESECTION OF BLADDER TUMOR WITH GYRUS (TURBT-GYRUS) N/A 12/31/2013   Procedure: TRANSURETHRAL RESECTION OF BLADDER TUMOR WITH GYRUS (TURBT-GYRUS);  Surgeon: Alexis Frock, MD;  Location: [facility];  Service: Urology;  Laterality: N/A;  . ZENKER'S DIVERTICULECTOMY N/A 09/09/2015   Procedure: ENDOSCOPIC  ZENKER'S DIVERTICULECTOMY;  Surgeon: Izora Gala, MD;  Location: [facility];  Service: ENT;  Laterality: N/A;  . ZENKER'S DIVERTICULECTOMY ENDOSCOPIC  09/09/2015    Current Outpatient Medications  Medication Sig Dispense Refill  . ascorbic acid (VITAMIN C) 500 MG tablet Take 500 mg by mouth daily.    Marland Kitchen aspirin 81 MG tablet Take 1 tablet (81 mg total) by mouth daily.    . carvedilol (COREG) 3.125 MG tablet Take 3.125 mg by mouth 2 (two) times daily.    . cetirizine (ZYRTEC) 10 MG tablet Take 10 mg by mouth daily.    . ferrous sulfate 325 (65 FE) MG EC tablet Take 325 mg by mouth daily with breakfast.    . finasteride (PROSCAR) 5 MG tablet Take 5 mg by mouth daily.    Marland Kitchen losartan (COZAAR) 100 MG tablet Take 100 mg by mouth daily.    . Misc Natural Products (OSTEO BI-FLEX ADV JOINT SHIELD) TABS Take 1 tablet by mouth daily.     . Multiple Vitamins-Minerals (CENTRUM SILVER ADULT 50+) TABS Take 1 tablet by mouth every morning.    . nitroGLYCERIN (NITROSTAT) 0.4 MG SL tablet Place 1 tablet (0.4 mg total) under the tongue every 5 (five) minutes as needed for chest pain. 25 tablet 3  . Omega-3 Fatty Acids (FISH OIL) 1000 MG CAPS Take 1 capsule by mouth daily.    . sertraline (ZOLOFT) 50 MG tablet Take 50 mg by mouth daily.    . vitamin B-12 (CYANOCOBALAMIN) 1000 MCG tablet 1 tablet (1,000 mcg total) by Per NG tube route daily. (Patient taking differently: Take 1,000 mcg by mouth daily. ) 30 tablet 0   No current facility-administered medications for this visit.   Allergies:  Patient has no known allergies.   Social History: The patient  reports that he quit smoking about 51 years ago. His smoking use included cigarettes, cigars, and pipe. He has a 40.00 pack-year smoking history. He has never used smokeless tobacco. He reports that he does not drink alcohol and does not use drugs.   Family History: The patient's family history is not on file.   ROS:  Please see the history of present illness.  Otherwise, complete review of systems is positive for none.  All other systems are reviewed and negative.   Physical Exam: VS:  Ht 6' (1.829 m)   Wt 214 lb (97.1 kg)   BMI 29.02 kg/m , BMI Body mass index is 29.02 kg/m.  Wt Readings from Last 3 Encounters:  05/21/20 214 lb (97.1 kg)  10/20/19 200 lb (90.7 kg)  07/27/19 190 lb (86.2 kg)    Speech pattern is normal and responding appropriately to questions.  No evidence of dyspnea, cough or wheezing noted during phone conversation.  ECG: No EKG today due to telemedicine visit  Recent Labwork: No results found for requested labs within last 8760 hours.  No results found for: CHOL, TRIG, HDL, CHOLHDL, VLDL, LDLCALC, LDLDIRECT  Other Studies Reviewed Today:   Echocardiogram 2/6/2019showed normal left ventricular systolic function and regional wall motion, LVEF  55 to 60%, mild LVH, moderately calcified aortic annulus and moderately calcified leaflets with probable mild aortic stenosis, and moderate left atrial dilatation.  Nuclear stress test 09/20/2018:   RBBB with PVC's seen.  Defect 1: There is a medium defect of moderate severity present in the mid inferoseptal and mid inferior location. There is excessive soft tissue attenuation artifact making rest/stress image comparison difficult. Wall motion appears grossly normal. Some evidence of reverse redistribution.  This is a low risk study. No significant ischemic zones.  Nuclear stress EF: 53%.      Assessment and Plan:  1. CAD in native artery   2. Paroxysmal atrial fibrillation (HCC)   3. Essential hypertension   4. Aortic valve stenosis, etiology of cardiac valve disease unspecified    1. CAD in native artery Denies any anginal or exertional symptoms.  Not very active on a daily basis.  Uses a walker to ambulate.  Continue aspirin 81 mg daily, nitroglycerin 0.4 mg sublingual as needed.  2. Paroxysmal atrial fibrillation (HCC) Denies any palpitations or  arrhythmias.  Continue aspirin 81 mg daily.  Continue carvedilol 3.125 mg p.o. twice daily.  3. Essential hypertension Blood pressure today 142/65 and heart rate of 90.  PCP recently increased losartan to 100 mg.  Due to history of falls and difficulty ambulating may be better for patient to have some permissive hypertension given history of falls, advanced age.  No adjustments to therapy made.  4. Aortic valve stenosis, etiology of cardiac valve disease unspecified Probable mild aortic stenosis on echocardiogram 2019  Medication Adjustments/Labs and Tests Ordered: Current medicines are reviewed at length with the patient today.  Concerns regarding medicines are outlined above.   Disposition: Follow-up with Dr. Domenic Polite or APP 6 months  I talked with the patient and his niece 10 minutes during telemedicine visit  Signed, Levell July, NP 05/21/2020 2:18 PM    [facility] [place], [place], [place] [address] Phone: [phone]; Fax: [phone]

## 2020-06-26 ENCOUNTER — Other Ambulatory Visit: Payer: Self-pay | Admitting: Cardiology

## 2020-08-04 DIAGNOSIS — D509 Iron deficiency anemia, unspecified: Secondary | ICD-10-CM | POA: Diagnosis not present

## 2020-08-04 DIAGNOSIS — M353 Polymyalgia rheumatica: Secondary | ICD-10-CM | POA: Diagnosis not present

## 2020-08-04 DIAGNOSIS — J069 Acute upper respiratory infection, unspecified: Secondary | ICD-10-CM | POA: Diagnosis not present

## 2020-08-04 DIAGNOSIS — C679 Malignant neoplasm of bladder, unspecified: Secondary | ICD-10-CM | POA: Diagnosis not present

## 2020-08-04 DIAGNOSIS — Z8551 Personal history of malignant neoplasm of bladder: Secondary | ICD-10-CM | POA: Diagnosis not present

## 2020-08-04 DIAGNOSIS — R1310 Dysphagia, unspecified: Secondary | ICD-10-CM | POA: Diagnosis not present

## 2020-08-04 DIAGNOSIS — M6281 Muscle weakness (generalized): Secondary | ICD-10-CM | POA: Diagnosis not present

## 2020-08-04 DIAGNOSIS — R7301 Impaired fasting glucose: Secondary | ICD-10-CM | POA: Diagnosis not present

## 2020-08-04 DIAGNOSIS — N390 Urinary tract infection, site not specified: Secondary | ICD-10-CM | POA: Diagnosis not present

## 2020-08-04 DIAGNOSIS — R5382 Chronic fatigue, unspecified: Secondary | ICD-10-CM | POA: Diagnosis not present

## 2020-08-04 DIAGNOSIS — L97529 Non-pressure chronic ulcer of other part of left foot with unspecified severity: Secondary | ICD-10-CM | POA: Diagnosis not present

## 2020-08-04 DIAGNOSIS — I10 Essential (primary) hypertension: Secondary | ICD-10-CM | POA: Diagnosis not present

## 2020-08-04 DIAGNOSIS — I499 Cardiac arrhythmia, unspecified: Secondary | ICD-10-CM | POA: Diagnosis not present

## 2020-08-20 DIAGNOSIS — I350 Nonrheumatic aortic (valve) stenosis: Secondary | ICD-10-CM | POA: Diagnosis not present

## 2020-08-20 DIAGNOSIS — I10 Essential (primary) hypertension: Secondary | ICD-10-CM | POA: Diagnosis not present

## 2020-08-20 DIAGNOSIS — C670 Malignant neoplasm of trigone of bladder: Secondary | ICD-10-CM | POA: Diagnosis not present

## 2020-08-20 DIAGNOSIS — R5383 Other fatigue: Secondary | ICD-10-CM | POA: Diagnosis not present

## 2020-08-20 DIAGNOSIS — I25118 Atherosclerotic heart disease of native coronary artery with other forms of angina pectoris: Secondary | ICD-10-CM | POA: Diagnosis not present

## 2020-08-20 DIAGNOSIS — R531 Weakness: Secondary | ICD-10-CM | POA: Diagnosis not present

## 2020-08-20 DIAGNOSIS — E782 Mixed hyperlipidemia: Secondary | ICD-10-CM | POA: Diagnosis not present

## 2020-08-20 DIAGNOSIS — D649 Anemia, unspecified: Secondary | ICD-10-CM | POA: Diagnosis not present

## 2020-08-20 DIAGNOSIS — M353 Polymyalgia rheumatica: Secondary | ICD-10-CM | POA: Diagnosis not present

## 2020-08-20 DIAGNOSIS — I480 Paroxysmal atrial fibrillation: Secondary | ICD-10-CM | POA: Diagnosis not present

## 2020-08-20 DIAGNOSIS — D519 Vitamin B12 deficiency anemia, unspecified: Secondary | ICD-10-CM | POA: Diagnosis not present

## 2020-08-20 DIAGNOSIS — R7301 Impaired fasting glucose: Secondary | ICD-10-CM | POA: Diagnosis not present

## 2020-09-15 DIAGNOSIS — M353 Polymyalgia rheumatica: Secondary | ICD-10-CM | POA: Diagnosis not present

## 2020-09-15 DIAGNOSIS — C679 Malignant neoplasm of bladder, unspecified: Secondary | ICD-10-CM | POA: Diagnosis not present

## 2020-09-15 DIAGNOSIS — D509 Iron deficiency anemia, unspecified: Secondary | ICD-10-CM | POA: Diagnosis not present

## 2020-09-15 DIAGNOSIS — I499 Cardiac arrhythmia, unspecified: Secondary | ICD-10-CM | POA: Diagnosis not present

## 2020-09-15 DIAGNOSIS — R5382 Chronic fatigue, unspecified: Secondary | ICD-10-CM | POA: Diagnosis not present

## 2020-09-15 DIAGNOSIS — R1310 Dysphagia, unspecified: Secondary | ICD-10-CM | POA: Diagnosis not present

## 2020-09-15 DIAGNOSIS — Z712 Person consulting for explanation of examination or test findings: Secondary | ICD-10-CM | POA: Diagnosis not present

## 2020-09-15 DIAGNOSIS — J069 Acute upper respiratory infection, unspecified: Secondary | ICD-10-CM | POA: Diagnosis not present

## 2020-09-15 DIAGNOSIS — L570 Actinic keratosis: Secondary | ICD-10-CM | POA: Diagnosis not present

## 2020-09-15 DIAGNOSIS — S2242XD Multiple fractures of ribs, left side, subsequent encounter for fracture with routine healing: Secondary | ICD-10-CM | POA: Diagnosis not present

## 2020-09-15 DIAGNOSIS — Z634 Disappearance and death of family member: Secondary | ICD-10-CM | POA: Diagnosis not present

## 2020-09-15 DIAGNOSIS — Z0000 Encounter for general adult medical examination without abnormal findings: Secondary | ICD-10-CM | POA: Diagnosis not present

## 2020-09-16 DIAGNOSIS — N390 Urinary tract infection, site not specified: Secondary | ICD-10-CM | POA: Diagnosis not present

## 2020-10-18 DIAGNOSIS — R296 Repeated falls: Secondary | ICD-10-CM | POA: Diagnosis not present

## 2020-10-18 DIAGNOSIS — F331 Major depressive disorder, recurrent, moderate: Secondary | ICD-10-CM | POA: Diagnosis not present

## 2020-10-18 DIAGNOSIS — I10 Essential (primary) hypertension: Secondary | ICD-10-CM | POA: Diagnosis not present

## 2020-10-20 ENCOUNTER — Other Ambulatory Visit: Payer: Self-pay

## 2020-10-20 ENCOUNTER — Emergency Department (HOSPITAL_COMMUNITY): Payer: PPO

## 2020-10-20 ENCOUNTER — Encounter (HOSPITAL_COMMUNITY): Payer: Self-pay

## 2020-10-20 ENCOUNTER — Observation Stay (HOSPITAL_COMMUNITY)
Admission: EM | Admit: 2020-10-20 | Discharge: 2020-10-22 | Disposition: A | Discharge status: Home / Self Care | Payer: PPO | Attending: Family Medicine | Admitting: Family Medicine

## 2020-10-20 DIAGNOSIS — Z7982 Long term (current) use of aspirin: Secondary | ICD-10-CM | POA: Diagnosis not present

## 2020-10-20 DIAGNOSIS — Z20822 Contact with and (suspected) exposure to covid-19: Secondary | ICD-10-CM | POA: Diagnosis not present

## 2020-10-20 DIAGNOSIS — R42 Dizziness and giddiness: Secondary | ICD-10-CM | POA: Diagnosis not present

## 2020-10-20 DIAGNOSIS — Z87891 Personal history of nicotine dependence: Secondary | ICD-10-CM | POA: Insufficient documentation

## 2020-10-20 DIAGNOSIS — Z8551 Personal history of malignant neoplasm of bladder: Secondary | ICD-10-CM | POA: Diagnosis not present

## 2020-10-20 DIAGNOSIS — Y900 Blood alcohol level of less than 20 mg/100 ml: Secondary | ICD-10-CM | POA: Insufficient documentation

## 2020-10-20 DIAGNOSIS — Z85820 Personal history of malignant melanoma of skin: Secondary | ICD-10-CM | POA: Diagnosis not present

## 2020-10-20 DIAGNOSIS — I2510 Atherosclerotic heart disease of native coronary artery without angina pectoris: Secondary | ICD-10-CM | POA: Insufficient documentation

## 2020-10-20 DIAGNOSIS — R278 Other lack of coordination: Secondary | ICD-10-CM | POA: Diagnosis not present

## 2020-10-20 DIAGNOSIS — R404 Transient alteration of awareness: Secondary | ICD-10-CM | POA: Diagnosis not present

## 2020-10-20 DIAGNOSIS — N3000 Acute cystitis without hematuria: Secondary | ICD-10-CM | POA: Diagnosis not present

## 2020-10-20 DIAGNOSIS — R2681 Unsteadiness on feet: Secondary | ICD-10-CM | POA: Diagnosis not present

## 2020-10-20 DIAGNOSIS — W19XXXA Unspecified fall, initial encounter: Secondary | ICD-10-CM

## 2020-10-20 DIAGNOSIS — B9689 Other specified bacterial agents as the cause of diseases classified elsewhere: Secondary | ICD-10-CM | POA: Insufficient documentation

## 2020-10-20 DIAGNOSIS — G8191 Hemiplegia, unspecified affecting right dominant side: Secondary | ICD-10-CM | POA: Diagnosis not present

## 2020-10-20 DIAGNOSIS — R6889 Other general symptoms and signs: Secondary | ICD-10-CM | POA: Diagnosis not present

## 2020-10-20 DIAGNOSIS — R270 Ataxia, unspecified: Secondary | ICD-10-CM | POA: Diagnosis not present

## 2020-10-20 DIAGNOSIS — Z743 Need for continuous supervision: Secondary | ICD-10-CM | POA: Diagnosis not present

## 2020-10-20 DIAGNOSIS — R262 Difficulty in walking, not elsewhere classified: Secondary | ICD-10-CM | POA: Diagnosis not present

## 2020-10-20 DIAGNOSIS — G459 Transient cerebral ischemic attack, unspecified: Secondary | ICD-10-CM | POA: Diagnosis not present

## 2020-10-20 DIAGNOSIS — S0990XA Unspecified injury of head, initial encounter: Secondary | ICD-10-CM | POA: Diagnosis not present

## 2020-10-20 DIAGNOSIS — Z79899 Other long term (current) drug therapy: Secondary | ICD-10-CM | POA: Diagnosis not present

## 2020-10-20 DIAGNOSIS — N390 Urinary tract infection, site not specified: Secondary | ICD-10-CM | POA: Diagnosis present

## 2020-10-20 DIAGNOSIS — I10 Essential (primary) hypertension: Secondary | ICD-10-CM | POA: Diagnosis not present

## 2020-10-20 LAB — URINALYSIS, ROUTINE W REFLEX MICROSCOPIC
Bilirubin Urine: NEGATIVE
Glucose, UA: NEGATIVE mg/dL
Ketones, ur: NEGATIVE mg/dL
Nitrite: NEGATIVE
Protein, ur: NEGATIVE mg/dL
Specific Gravity, Urine: 1.01 (ref 1.005–1.030)
pH: 5.5 (ref 5.0–8.0)

## 2020-10-20 LAB — ETHANOL: Alcohol, Ethyl (B): <10 mg/dL (ref <10)

## 2020-10-20 LAB — RAPID URINE DRUG SCREEN, HOSP PERFORMED
Amphetamines: NOT DETECTED
Barbiturates: NOT DETECTED
Benzodiazepines: NOT DETECTED
Cocaine: NOT DETECTED
Opiates: NOT DETECTED
Tetrahydrocannabinol: NOT DETECTED

## 2020-10-20 LAB — I-STAT CHEM 8, ED
BUN: 25 mg/dL — ABNORMAL HIGH (ref 8–23)
Calcium, Ion: 1.25 mmol/L (ref 1.15–1.40)
Chloride: 103 mmol/L (ref 98–111)
Creatinine, Ser: 0.9 mg/dL (ref 0.61–1.24)
Glucose, Bld: 93 mg/dL (ref 70–99)
HCT: 35 % — ABNORMAL LOW (ref 39.0–52.0)
Hemoglobin: 11.9 g/dL — ABNORMAL LOW (ref 13.0–17.0)
Potassium: 4.4 mmol/L (ref 3.5–5.1)
Sodium: 138 mmol/L (ref 135–145)
TCO2: 25 mmol/L (ref 22–32)

## 2020-10-20 LAB — DIFFERENTIAL
Abs Immature Granulocytes: 0.01 10*3/uL (ref 0.00–0.07)
Basophils Absolute: 0.1 10*3/uL (ref 0.0–0.1)
Basophils Relative: 1 %
Eosinophils Absolute: 0.4 10*3/uL (ref 0.0–0.5)
Eosinophils Relative: 5 %
Immature Granulocytes: 0 %
Lymphocytes Relative: 25 %
Lymphs Abs: 1.7 10*3/uL (ref 0.7–4.0)
Monocytes Absolute: 0.8 10*3/uL (ref 0.1–1.0)
Monocytes Relative: 12 %
Neutro Abs: 3.8 10*3/uL (ref 1.7–7.7)
Neutrophils Relative %: 57 %

## 2020-10-20 LAB — CBC
HCT: 34.1 % — ABNORMAL LOW (ref 39.0–52.0)
Hemoglobin: 10.9 g/dL — ABNORMAL LOW (ref 13.0–17.0)
MCH: 29.2 pg (ref 26.0–34.0)
MCHC: 32 g/dL (ref 30.0–36.0)
MCV: 91.4 fL (ref 80.0–100.0)
Platelets: 216 10*3/uL (ref 150–400)
RBC: 3.73 MIL/uL — ABNORMAL LOW (ref 4.22–5.81)
RDW: 12.7 % (ref 11.5–15.5)
WBC: 6.7 10*3/uL (ref 4.0–10.5)
nRBC: 0 % (ref 0.0–0.2)

## 2020-10-20 LAB — URINALYSIS, MICROSCOPIC (REFLEX)

## 2020-10-20 LAB — COMPREHENSIVE METABOLIC PANEL
ALT: 14 U/L (ref 0–44)
AST: 19 U/L (ref 15–41)
Albumin: 3.7 g/dL (ref 3.5–5.0)
Alkaline Phosphatase: 48 U/L (ref 38–126)
Anion gap: 7 (ref 5–15)
BUN: 24 mg/dL — ABNORMAL HIGH (ref 8–23)
CO2: 26 mmol/L (ref 22–32)
Calcium: 8.7 mg/dL — ABNORMAL LOW (ref 8.9–10.3)
Chloride: 103 mmol/L (ref 98–111)
Creatinine, Ser: 0.86 mg/dL (ref 0.61–1.24)
GFR, Estimated: >60 mL/min (ref >60)
Glucose, Bld: 99 mg/dL (ref 70–99)
Potassium: 4.4 mmol/L (ref 3.5–5.1)
Sodium: 136 mmol/L (ref 135–145)
Total Bilirubin: 0.8 mg/dL (ref 0.3–1.2)
Total Protein: 7.2 g/dL (ref 6.5–8.1)

## 2020-10-20 LAB — RESP PANEL BY RT-PCR (FLU A&B, COVID) ARPGX2
Influenza A by PCR: NEGATIVE
Influenza B by PCR: NEGATIVE
SARS Coronavirus 2 by RT PCR: NEGATIVE

## 2020-10-20 LAB — PROTIME-INR
INR: 1 (ref 0.8–1.2)
Prothrombin Time: 13.2 seconds (ref 11.4–15.2)

## 2020-10-20 LAB — APTT: aPTT: 31 seconds (ref 24–36)

## 2020-10-20 LAB — POC OCCULT BLOOD, ED: Fecal Occult Bld: NEGATIVE

## 2020-10-20 MED ORDER — ACETAMINOPHEN 160 MG/5ML PO SOLN: 650.0000 mg | ORAL | Status: DC | PRN

## 2020-10-20 MED ORDER — SODIUM CHLORIDE 0.9 % IV SOLN: INTRAVENOUS | Status: DC

## 2020-10-20 MED ORDER — VITAMIN B-12 1000 MCG PO TABS
1000.0000 ug | ORAL_TABLET | Freq: Every day | ORAL | Status: DC
  Administered 2020-10-20 – 2020-10-22 (×3): 1000 ug via ORAL
  Filled 2020-10-20 (×3): qty 1

## 2020-10-20 MED ORDER — ASPIRIN EC 81 MG PO TBEC
81.0000 mg | DELAYED_RELEASE_TABLET | Freq: Every day | ORAL | Status: DC
  Administered 2020-10-20: 81 mg via ORAL
  Filled 2020-10-20: qty 1

## 2020-10-20 MED ORDER — ENOXAPARIN SODIUM 40 MG/0.4ML ~~LOC~~ SOLN
40.0000 mg | SUBCUTANEOUS | Status: DC
  Administered 2020-10-20 – 2020-10-21 (×2): 40 mg via SUBCUTANEOUS
  Filled 2020-10-20 (×2): qty 0.4

## 2020-10-20 MED ORDER — LORAZEPAM 2 MG/ML IJ SOLN
1.0000 mg | Freq: Once | INTRAMUSCULAR | Status: AC
  Administered 2020-10-20: 1 mg via INTRAVENOUS
  Filled 2020-10-20: qty 1

## 2020-10-20 MED ORDER — FINASTERIDE 5 MG PO TABS
5.0000 mg | ORAL_TABLET | Freq: Every day | ORAL | Status: DC
Start: 2020-10-20 — End: 2020-10-22
  Administered 2020-10-20 – 2020-10-22 (×3): 5 mg via ORAL
  Filled 2020-10-20 (×3): qty 1

## 2020-10-20 MED ORDER — LOSARTAN POTASSIUM 50 MG PO TABS
100.0000 mg | ORAL_TABLET | Freq: Every day | ORAL | Status: DC
  Administered 2020-10-20 – 2020-10-22 (×3): 100 mg via ORAL
  Filled 2020-10-20: qty 4
  Filled 2020-10-20 (×2): qty 2

## 2020-10-20 MED ORDER — IOHEXOL 350 MG/ML SOLN
100.0000 mL | Freq: Once | INTRAVENOUS | Status: AC | PRN
  Administered 2020-10-20: 100 mL via INTRAVENOUS

## 2020-10-20 MED ORDER — NITROGLYCERIN 0.4 MG SL SUBL: 0.4000 mg | SUBLINGUAL_TABLET | SUBLINGUAL | Status: DC | PRN

## 2020-10-20 MED ORDER — ACETAMINOPHEN 650 MG RE SUPP: 650.0000 mg | RECTAL | Status: DC | PRN

## 2020-10-20 MED ORDER — SENNOSIDES-DOCUSATE SODIUM 8.6-50 MG PO TABS
1.0000 | ORAL_TABLET | Freq: Every day | ORAL | Status: DC
  Administered 2020-10-20 – 2020-10-21 (×2): 1 via ORAL
  Filled 2020-10-20 (×2): qty 1

## 2020-10-20 MED ORDER — CARVEDILOL 3.125 MG PO TABS
3.1250 mg | ORAL_TABLET | Freq: Two times a day (BID) | ORAL | Status: DC
  Administered 2020-10-20 – 2020-10-22 (×4): 3.125 mg via ORAL
  Filled 2020-10-20 (×4): qty 1

## 2020-10-20 MED ORDER — ACETAMINOPHEN 325 MG PO TABS
650.0000 mg | ORAL_TABLET | ORAL | Status: DC | PRN
  Administered 2020-10-21: 650 mg via ORAL
  Filled 2020-10-20: qty 2

## 2020-10-20 MED ORDER — SERTRALINE HCL 50 MG PO TABS
50.0000 mg | ORAL_TABLET | Freq: Every day | ORAL | Status: DC
Start: 2020-10-20 — End: 2020-10-22
  Administered 2020-10-20 – 2020-10-22 (×3): 50 mg via ORAL
  Filled 2020-10-20 (×3): qty 1

## 2020-10-20 MED ORDER — STROKE: EARLY STAGES OF RECOVERY BOOK
Freq: Once | Status: DC
  Filled 2020-10-20: qty 1

## 2020-10-20 MED ORDER — CIPROFLOXACIN HCL 250 MG PO TABS
250.0000 mg | ORAL_TABLET | Freq: Two times a day (BID) | ORAL | Status: DC
  Administered 2020-10-20 – 2020-10-22 (×4): 250 mg via ORAL
  Filled 2020-10-20 (×4): qty 1

## 2020-10-20 MED ORDER — SODIUM CHLORIDE 0.9 % IV BOLUS
500.0000 mL | Freq: Once | INTRAVENOUS | Status: AC
  Administered 2020-10-20: 500 mL via INTRAVENOUS

## 2020-10-20 MED ORDER — FERROUS SULFATE 325 (65 FE) MG PO TABS
325.0000 mg | ORAL_TABLET | Freq: Every day | ORAL | Status: DC
  Administered 2020-10-21 – 2020-10-22 (×2): 325 mg via ORAL
  Filled 2020-10-20 (×2): qty 1

## 2020-10-20 NOTE — ED Notes (Signed)
Alanson PulsJuanda Crumble Pyor [phone]

## 2020-10-20 NOTE — ED Triage Notes (Signed)
Arrived via EMS Fell Sunday at home and has dizziness since fall Pt reports he has fell the last 2 nights at home Recently completed abx for UTI   Denies pain  No LOC

## 2020-10-20 NOTE — H&P (Addendum)
History and Physical    Manuel Le LFY:[id number] DOB: 03-Nov-1926 DOA: 10/20/2020  PCP: Celene Squibb, MD ( ) Patient coming from: home  I have personally briefly reviewed patient's old medical records in [place]  Chief Complaint: dizziness, falls, imbalance  HPI: Manuel Le is a 87 y.o. male with medical history significant of h/o A fib/flutter, bladder ca, CAD, HTN, iron deficiency anemia who  had two episodes of falling out of bed and problems with balance over the past two days. He denies focal weakness, paresthesias, HA, change in speech or other focal symptoms. He presents to [facility] for evaluation with a concern for possible CVA.  ED Course: T 98.1  169/82 HR 77  RR 16. Per EDP he was too dizzy to stand for full evaluation but his neuro exam was reported as non-focal. Bmet - nl, U/A w/ rare bacteria and 11-2- WBC/hpf. CBC with mild anemia w/o leukocytosis. CTA head and neck negative for LVO or significant stenosis. EGK reveals Sinus Rhythm with PACs. Per EDP neuro consult recommended admission and MRI brain to evaluate for cerebellar disease. TRH called to admit.   Review of Systems: As per HPI otherwise 10 point review of systems negative.    Past Medical History:  Diagnosis Date  . Arthritis    OSTEO  . Atrial fibrillation and flutter ([place])   . Bladder cancer ([place])   . Coronary artery disease   . Dysrhythmia   . Exertional angina ([place]) 07/08/2014  . H/O urinary frequency   . Hematuria   . Hypertension   . Iron deficiency anemia   . Kidney stones    hx of   . Mild obstructive sleep apnea    no cpap  . S/P coronary artery stent placement 07/24/2014  . Skin cancer   . Weakness     Past Surgical History:  Procedure Laterality Date  . CARDIAC CATHETERIZATION    . CARDIOVASCULAR STRESS TEST  12-15-2013  DR Kate Sable ([place])   MILD - MODERATE PERI-INFARCT ISCHEMIA  OF INFERIOR WALL, MID-INFEROSEPTAL WALL, MID-INFEROLATERAL WALL, & BASAL  INFERIOR WALL/   NORMAL LVF /  EF 59%/  INTERMEDIATE RISK STUDY  . COLONOSCOPY  07/04/2012   Procedure: COLONOSCOPY;  Surgeon: Rogene Houston, MD;  Location[facility];  Service: Endoscopy;  Laterality: N/A;  320  . CORONARY STENT PLACEMENT  07/08/2014   PTCA of RCA   DR COOPER  . CYSTOSCOPY W/ RETROGRADES Bilateral 12/31/2013   Procedure: CYSTOSCOPY WITH RETROGRADE PYELOGRAM;  Surgeon: Alexis Frock, MD;  Location: [facility];  Service: Urology;  Laterality: Bilateral;  . CYSTOSCOPY W/ RETROGRADES Bilateral 01/20/2015   Procedure: CYSTOSCOPY WITH RETROGRADE PYELOGRAM;  Surgeon: Alexis Frock, MD;  Location: [facility];  Service: Urology;  Laterality: Bilateral;  . ESOPHAGOGASTRODUODENOSCOPY  07/04/2012   Procedure: ESOPHAGOGASTRODUODENOSCOPY (EGD);  Surgeon: Rogene Houston, MD;  [facility];  Service: Endoscopy;  Laterality: N/A;  . EYE SURGERY     past cataract surgery bilat  . HERNIA REPAIR Right   . LEFT HEART CATHETERIZATION WITH CORONARY ANGIOGRAM N/A 07/08/2014   Procedure: LEFT HEART CATHETERIZATION WITH CORONARY ANGIOGRAM;  Surgeon: Blane Ohara, MD;  Location: [facility] CATH LAB;  Service: Cardiovascular;  Laterality: N/A;  . PERCUTANEOUS CORONARY STENT INTERVENTION (PCI-S)  07/08/2014   Procedure: PERCUTANEOUS CORONARY STENT INTERVENTION (PCI-S);  Surgeon: Blane Ohara, MD;  Location: [facility] CATH LAB;  Service: Cardiovascular;;  . TOTAL KNEE ARTHROPLASTY Bilateral 2000  &  2003?  . TRANSTHORACIC ECHOCARDIOGRAM  09-11-2013   MODERATE LVH/  EF 55-60%/  MILD MR & TR/  MILD TO MODERATE CALCIFIED AV WITHOUT STENOSIS/  MILD LAE/   MODERATE PR  . TRANSURETHRAL RESECTION OF BLADDER TUMOR N/A 01/20/2015   Procedure: TRANSURETHRAL RESECTION OF BLADDER TUMOR (TURBT);  Surgeon: Alexis Frock, MD;  Location[facility];  Service: Urology;  Laterality: N/A;  . TRANSURETHRAL RESECTION OF BLADDER TUMOR WITH GYRUS (TURBT-GYRUS) N/A 12/31/2013   Procedure: TRANSURETHRAL RESECTION OF BLADDER TUMOR  WITH GYRUS (TURBT-GYRUS);  Surgeon: Alexis Frock, MD;  Location: [facility];  Service: Urology;  Laterality: N/A;  . ZENKER'S DIVERTICULECTOMY N/A 09/09/2015   Procedure: ENDOSCOPIC ZENKER'S DIVERTICULECTOMY;  Surgeon: Izora Gala, MD;  Location: [place];  Service: ENT;  Laterality: N/A;  . ZENKER'S DIVERTICULECTOMY ENDOSCOPIC  09/09/2015    Soc Hx - 1st marriage ended, 2nd marriage 36 years and then he was widowed. He has not children. 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 He lives in his own home. His niece and nephew look after him.   reports that he quit smoking about 52 years ago. His smoking use included cigarettes, cigars, and pipe. He has a 40.00 pack-year smoking history. He has never used smokeless tobacco. He reports that he does not drink alcohol and does not use drugs.  No Known Allergies  History reviewed. No pertinent family history.   Prior to Admission medications   Medication Sig Start Date End Date Taking? Authorizing Provider  ascorbic acid (VITAMIN C) 500 MG tablet Take 500 mg by mouth daily.    [provider]  aspirin 81 MG tablet Take 1 tablet (81 mg total) by mouth daily. 09/09/18   Herminio Commons, MD  carvedilol (COREG) 3.125 MG tablet Take 3.125 mg by mouth 2 (two) times daily. 05/17/20   [provider]  cetirizine (ZYRTEC) 10 MG tablet Take 10 mg by mouth daily.    [provider]  ferrous sulfate 325 (65 FE) MG EC tablet Take 325 mg by mouth daily with breakfast.    [provider]  finasteride (PROSCAR) 5 MG tablet Take 5 mg by mouth daily.    [provider]  losartan (COZAAR) 100 MG tablet Take 100 mg by mouth daily.    [provider]  Misc Natural Products (OSTEO BI-FLEX ADV JOINT SHIELD) TABS Take 1 tablet by mouth daily.     [provider]  Multiple Vitamins-Minerals (CENTRUM SILVER ADULT 50+) TABS Take 1 tablet by mouth every morning.    [provider]   nitroGLYCERIN (NITROSTAT) 0.4 MG SL tablet Place 1 tablet (0.4 mg total) under the tongue every 5 (five) minutes as needed for chest pain. 07/03/14   Herminio Commons, MD  Omega-3 Fatty Acids (FISH OIL) 1000 MG CAPS Take 1 capsule by mouth daily.    [provider]  sertraline (ZOLOFT) 50 MG tablet Take 50 mg by mouth daily. 06/30/19   [provider]  vitamin B-12 (CYANOCOBALAMIN) 1000 MCG tablet 1 tablet (1,000 mcg total) by Per NG tube route daily. Patient taking differently: Take 1,000 mcg by mouth daily.  09/15/15   Izora Gala, MD    Physical Exam: Vitals:   10/20/20 1119 10/20/20 1230 10/20/20 1400 10/20/20 1605  BP: (!) 192/77 (!) 172/82 (!) 191/69 (!) 169/82  Pulse: 67 72 71 77  Resp: 20 (!) 24 (!) 23 16  Temp: 98.1 F (36.7 C)     TempSrc:  Oral     SpO2: 99% 98% 95% 100%  Weight:      Height:         Vitals:   10/20/20 1119 10/20/20 1230 10/20/20 1400 10/20/20 1605  BP: (!) 192/77 (!) 172/82 (!) 191/69 (!) 169/82  Pulse: 67 72 71 77  Resp: 20 (!) 24 (!) 23 16  Temp: 98.1 F (36.7 C)     TempSrc: Oral     SpO2: 99% 98% 95% 100%  Weight:      Height:       Caveat - exam limited by mobility issues. General: WNWD heavyset man in no distress. Mildly HOH. Eyes: PERRL, lids and conjunctivae normal ENMT: Mucous membranes are moist.   Neck: normal, supple, no masses, no thyromegaly Respiratory: clear to auscultation bilaterally, no wheezing, no crackles. Normal respiratory effort. No accessory muscle use.  Cardiovascular: Regular rate and rhythm, II/VI  Murmur at RSB /no rubs / no gallops. No extremity edema. 2+ pedal pulses. No carotid bruits.  Abdomen: no tenderness, no masses palpated. No hepatosplenomegaly. Bowel sounds positive.  Musculoskeletal: no clubbing / cyanosis. No joint deformity upper and lower extremities. Good ROM, no contractures. Normal muscle tone.  Skin: no rashes, lesions, ulcers. No induration Neurologic: CN 2-12 grossly  intact with nl facial symmetry and movement, PERRLA, EOMI, speech clear.  Strength 5/5 in all 4.  Psychiatric: Normal judgment and insight. Alert and oriented x 3. Normal mood.     Labs on Admission: I have personally reviewed following labs and imaging studies  CBC: Recent Labs  Lab 10/20/20 1205 10/20/20 1218  WBC 6.7  --   NEUTROABS 3.8  --   HGB 10.9* 11.9*  HCT 34.1* 35.0*  MCV 91.4  --   PLT 216  --    Basic Metabolic Panel: Recent Labs  Lab 10/20/20 1205 10/20/20 1218  NA 136 138  K 4.4 4.4  CL 103 103  CO2 26  --   GLUCOSE 99 93  BUN 24* 25*  CREATININE 0.86 0.90  CALCIUM 8.7*  --    GFR: Estimated Creatinine Clearance: 64.6 mL/min (by C-G formula based on SCr of 0.9 mg/dL). Liver Function Tests: Recent Labs  Lab 10/20/20 1205  AST 19  ALT 14  ALKPHOS 48  BILITOT 0.8  PROT 7.2  ALBUMIN 3.7   No results for input(s): LIPASE, AMYLASE in the last 168 hours. No results for input(s): AMMONIA in the last 168 hours. Coagulation Profile: Recent Labs  Lab 10/20/20 1205  INR 1.0   Cardiac Enzymes: No results for input(s): CKTOTAL, CKMB, CKMBINDEX, TROPONINI in the last 168 hours. BNP (last 3 results) No results for input(s): PROBNP in the last 8760 hours. HbA1C: No results for input(s): HGBA1C in the last 72 hours. CBG: No results for input(s): GLUCAP in the last 168 hours. Lipid Profile: No results for input(s): CHOL, HDL, LDLCALC, TRIG, CHOLHDL, LDLDIRECT in the last 72 hours. Thyroid Function Tests: No results for input(s): TSH, T4TOTAL, FREET4, T3FREE, THYROIDAB in the last 72 hours. Anemia Panel: No results for input(s): VITAMINB12, FOLATE, FERRITIN, TIBC, IRON, RETICCTPCT in the last 72 hours. Urine analysis:    Component Value Date/Time   COLORURINE YELLOW 10/20/2020 1218   APPEARANCEUR CLEAR 10/20/2020 1218   LABSPEC 1.010 10/20/2020 1218   PHURINE 5.5 10/20/2020 [place] 10/20/2020 [place] (A) 10/20/2020  1218   BILIRUBINUR NEGATIVE 10/20/2020 [place] 10/20/2020 1218   PROTEINUR NEGATIVE 10/20/2020  1218   UROBILINOGEN 1.0 11/27/2013 2214   NITRITE NEGATIVE 10/20/2020 1218   LEUKOCYTESUR TRACE (A) 10/20/2020 1218    Radiological Exams on Admission: CT Angio Head W or Wo Contrast  Result Date: 10/20/2020 CLINICAL DATA:  Recent fall, dizziness EXAM: CT ANGIOGRAPHY HEAD AND NECK TECHNIQUE: Multidetector CT imaging of the head and neck was performed using the standard protocol during bolus administration of intravenous contrast. Multiplanar CT image reconstructions and MIPs were obtained to evaluate the vascular anatomy. Carotid stenosis measurements (when applicable) are obtained utilizing NASCET criteria, using the distal internal carotid diameter as the denominator. CONTRAST:  160mL OMNIPAQUE IOHEXOL 350 MG/ML SOLN COMPARISON:  None. FINDINGS: CT HEAD Brain: There is no acute intracranial hemorrhage, mass effect, or edema. Gray-white differentiation is preserved. There is no extra-axial fluid collection. Patchy and confluent areas of hypoattenuation in the supratentorial white matter are nonspecific but probably reflect moderate chronic microvascular ischemic changes. Prominence of the ventricles and sulci reflects generalized parenchymal volume loss. Vascular: No hyperdense vessel. Skull: Unremarkable. Sinuses/Orbits: No acute finding. Other: None. Review of the MIP images confirms the above findings CTA NECK Aortic arch: Mixed calcified plaque along the arch and patent great vessel origins. Right carotid system: Patent. Calcified plaque along the common carotid causing less than 50% stenosis. Mixed but primarily calcified plaque along the proximal internal carotid causing less than 50% stenosis. Left carotid system: Patent. Mixed plaque along the common carotid without measurable stenosis. Calcified plaque along the proximal internal carotid without measurable stenosis. Vertebral  arteries: Patent. Calcified plaque near the origins without significant stenosis. Right vertebral artery slightly dominant. No measurable stenosis. Skeleton: Degenerative changes of the cervical spine. Other neck: No mass or adenopathy. Upper chest: No apical lung mass. Subpleural interstitial thickening may reflect fibrosis. Review of the MIP images confirms the above findings CTA HEAD Anterior circulation: Intracranial internal carotid arteries are patent with calcified plaque but no significant stenosis. Anterior cerebral arteries are patent with anterior communicating artery present. Middle cerebral arteries are patent. Posterior circulation: Intracranial vertebral arteries are patent with mild calcified plaque on the left. Basilar artery is patent. Major cerebellar artery branch origins are patent. Posterior cerebral arteries are patent. Venous sinuses: Patent as allowed by contrast bolus timing. Review of the MIP images confirms the above findings IMPRESSION: No acute intracranial abnormality. Chronic microvascular ischemic changes. No large vessel occlusion or hemodynamically significant stenosis. No evidence of dissection. Electronically Signed   By: Macy Mis M.D.   On: 10/20/2020 14:25   CT Angio Neck W and/or Wo Contrast  Result Date: 10/20/2020 CLINICAL DATA:  Recent fall, dizziness EXAM: CT ANGIOGRAPHY HEAD AND NECK TECHNIQUE: Multidetector CT imaging of the head and neck was performed using the standard protocol during bolus administration of intravenous contrast. Multiplanar CT image reconstructions and MIPs were obtained to evaluate the vascular anatomy. Carotid stenosis measurements (when applicable) are obtained utilizing NASCET criteria, using the distal internal carotid diameter as the denominator. CONTRAST:  183mL OMNIPAQUE IOHEXOL 350 MG/ML SOLN COMPARISON:  None. FINDINGS: CT HEAD Brain: There is no acute intracranial hemorrhage, mass effect, or edema. Gray-white differentiation is  preserved. There is no extra-axial fluid collection. Patchy and confluent areas of hypoattenuation in the supratentorial white matter are nonspecific but probably reflect moderate chronic microvascular ischemic changes. Prominence of the ventricles and sulci reflects generalized parenchymal volume loss. Vascular: No hyperdense vessel. Skull: Unremarkable. Sinuses/Orbits: No acute finding. Other: None. Review of the MIP images confirms the above findings CTA NECK Aortic arch: Mixed  calcified plaque along the arch and patent great vessel origins. Right carotid system: Patent. Calcified plaque along the common carotid causing less than 50% stenosis. Mixed but primarily calcified plaque along the proximal internal carotid causing less than 50% stenosis. Left carotid system: Patent. Mixed plaque along the common carotid without measurable stenosis. Calcified plaque along the proximal internal carotid without measurable stenosis. Vertebral arteries: Patent. Calcified plaque near the origins without significant stenosis. Right vertebral artery slightly dominant. No measurable stenosis. Skeleton: Degenerative changes of the cervical spine. Other neck: No mass or adenopathy. Upper chest: No apical lung mass. Subpleural interstitial thickening may reflect fibrosis. Review of the MIP images confirms the above findings CTA HEAD Anterior circulation: Intracranial internal carotid arteries are patent with calcified plaque but no significant stenosis. Anterior cerebral arteries are patent with anterior communicating artery present. Middle cerebral arteries are patent. Posterior circulation: Intracranial vertebral arteries are patent with mild calcified plaque on the left. Basilar artery is patent. Major cerebellar artery branch origins are patent. Posterior cerebral arteries are patent. Venous sinuses: Patent as allowed by contrast bolus timing. Review of the MIP images confirms the above findings IMPRESSION: No acute intracranial  abnormality. Chronic microvascular ischemic changes. No large vessel occlusion or hemodynamically significant stenosis. No evidence of dissection. Electronically Signed   By: Macy Mis M.D.   On: 10/20/2020 14:25    EKG: Independently reviewed. Sinus Rhythm, APCs RSR' V1V2.  ECHO - 09/26/17 - EF 65-68%  Diastolic dysfunction - not graded.  Assessment/Plan Active Problems:   Truncal ataxia   Dizzy   HTN (hypertension)   UTI (urinary tract infection)     1. Neuro - truncal ataxia, imbalance with non-focal exam. CTA Head and Neck negative. MRI brain pending. Concern is for possible Cerebellar/brainstem injury as cause of imbalance. Plan Tele admit  MRI pending  PT/OT eval  Neurology to follow  2. HTN - mildly elevated. Will be permissive at this time Plan Continue home meds  3. UTI - mildly positive U/A Plan  cipro 250 mg bid x 5 days.  4. Cardiac - h/o a fib/flutter but now in NSR.  Plan Continue home meds  Telemetry admit  5. Code status - on questioning patient states "I have a paper" - DNR  DVT prophylaxis: lovenox Code Status: DNR  Family Communication: attempted to contact Haze Justin, nephew - no answer. Stephannie Li, another nephew, holds Kathi Der  [phone] Disposition Plan: TBD  Consults called: Neurology-Dr. Lorrin Goodell Admission status: observation-tele   Adella Hare MD [facility] Pager [phone]  If 7PM-7AM, please contact night-coverage [email or web address] Password [facility]  10/20/2020, 4:37 PM

## 2020-10-20 NOTE — ED Provider Notes (Signed)
[facility] EMERGENCY DEPARTMENT Provider Note   CSN: [id number] Arrival date & time: 10/20/20  1111     History Chief Complaint  Patient presents with  . Dizziness    Manuel Le is a 87 y.o. male.  He has a history of A. fib but does not take anticoagulation.  He is presenting by EMS after 2 falls.  Apparently he fell out of bed 2 days ago and fell out of bed again last night.  He said he is dizzy lightheaded when he stands up.  He denies any focal numbness or weakness.  No headache neck pain blurry vision double vision chest pain shortness of breath abdominal pain vomiting diarrhea or urinary symptoms.  He said he talked to his primary care doctor who told him he might had a stroke and needed to come up here.  Recent UTI reportedly.  The history is provided by the patient and the EMS personnel.  Dizziness Quality:  Lightheadedness Severity:  Unable to specify Onset quality:  Gradual Timing:  Intermittent Progression:  Unchanged Chronicity:  New Context: standing up   Relieved by:  Lying down Worsened by:  Standing up Ineffective treatments:  None tried Associated symptoms: no chest pain, no diarrhea, no headaches, no nausea, no shortness of breath, no syncope, no vision changes and no vomiting   Risk factors: heart disease        Past Medical History:  Diagnosis Date  . Arthritis    OSTEO  . Atrial fibrillation and flutter ([place])   . Bladder cancer ([place])   . Coronary artery disease   . Dysrhythmia   . Exertional angina ([place]) 07/08/2014  . H/O urinary frequency   . Hematuria   . Hypertension   . Iron deficiency anemia   . Kidney stones    hx of   . Mild obstructive sleep apnea    no cpap  . S/P coronary artery stent placement 07/24/2014  . Skin cancer   . Weakness     Patient Active Problem List   Diagnosis Date Noted  . Acute respiratory failure with hypoxia ([place])   . Influenza with pneumonia   . Palliative care encounter   . Sepsis ([place]) 09/25/2017  .  Fever 09/25/2017  . UTI (urinary tract infection) 09/25/2017  . Abnormal liver function 09/25/2017  . Tachycardia 09/25/2017  . Influenza A   . Pressure ulcer 09/14/2015  . Zenker's diverticulum 09/09/2015  . Bladder cancer ([place]) 07/14/2015  . CAD (coronary artery disease) 07/24/2014  . S/P coronary artery stent placement (RCA bare metal stent on 07/08/14) 07/24/2014  . Exertional angina ([place]) 07/08/2014  . Encounter for therapeutic drug monitoring 09/12/2013  . Atrial fibrillation ([place]) 09/08/2013  . HTN (hypertension) 09/08/2013  . Fatigue 09/08/2013  . Anemia 06/26/2012    Past Surgical History:  Procedure Laterality Date  . CARDIAC CATHETERIZATION    . CARDIOVASCULAR STRESS TEST  12-15-2013  DR Kate Sable ([place])   MILD - MODERATE PERI-INFARCT ISCHEMIA  OF INFERIOR WALL, MID-INFEROSEPTAL WALL, MID-INFEROLATERAL WALL, & BASAL INFERIOR WALL/   NORMAL LVF /  EF 59%/  INTERMEDIATE RISK STUDY  . COLONOSCOPY  07/04/2012   Procedure: COLONOSCOPY;  Surgeon: Rogene Houston, MD;  Location: [facility];  Service: Endoscopy;  Laterality: N/A;  320  . CORONARY STENT PLACEMENT  07/08/2014   PTCA of RCA   DR COOPER  . CYSTOSCOPY W/ RETROGRADES Bilateral 12/31/2013   Procedure: CYSTOSCOPY WITH RETROGRADE PYELOGRAM;  Surgeon: Alexis Frock,  MD;  Location: [facility] ORS;  Service: Urology;  Laterality: Bilateral;  . CYSTOSCOPY W/ RETROGRADES Bilateral 01/20/2015   Procedure: CYSTOSCOPY WITH RETROGRADE PYELOGRAM;  Surgeon: Alexis Frock, MD;  Location: [facility];  Service: Urology;  Laterality: Bilateral;  . ESOPHAGOGASTRODUODENOSCOPY  07/04/2012   Procedure: ESOPHAGOGASTRODUODENOSCOPY (EGD);  Surgeon: Rogene Houston, MD;  [facility];  Service: Endoscopy;  Laterality: N/A;  . EYE SURGERY     past cataract surgery bilat  . HERNIA REPAIR Right   . LEFT HEART CATHETERIZATION WITH CORONARY ANGIOGRAM N/A 07/08/2014   Procedure: LEFT HEART CATHETERIZATION WITH CORONARY  ANGIOGRAM;  Surgeon: Blane Ohara, MD;  Location: [facility] CATH LAB;  Service: Cardiovascular;  Laterality: N/A;  . PERCUTANEOUS CORONARY STENT INTERVENTION (PCI-S)  07/08/2014   Procedure: PERCUTANEOUS CORONARY STENT INTERVENTION (PCI-S);  Surgeon: Blane Ohara, MD;  Location: [facility] CATH LAB;  Service: Cardiovascular;;  . TOTAL KNEE ARTHROPLASTY Bilateral 2000  &  2003?  . TRANSTHORACIC ECHOCARDIOGRAM  09-11-2013   MODERATE LVH/  EF 55-60%/  MILD MR & TR/  MILD TO MODERATE CALCIFIED AV WITHOUT STENOSIS/  MILD LAE/   MODERATE PR  . TRANSURETHRAL RESECTION OF BLADDER TUMOR N/A 01/20/2015   Procedure: TRANSURETHRAL RESECTION OF BLADDER TUMOR (TURBT);  Surgeon: Alexis Frock, MD;  Location: [facility];  Service: Urology;  Laterality: N/A;  . TRANSURETHRAL RESECTION OF BLADDER TUMOR WITH GYRUS (TURBT-GYRUS) N/A 12/31/2013   Procedure: TRANSURETHRAL RESECTION OF BLADDER TUMOR WITH GYRUS (TURBT-GYRUS);  Surgeon: Alexis Frock, MD;  Location: [facility];  Service: Urology;  Laterality: N/A;  . ZENKER'S DIVERTICULECTOMY N/A 09/09/2015   Procedure: ENDOSCOPIC ZENKER'S DIVERTICULECTOMY;  Surgeon: Izora Gala, MD;  Location: [place];  Service: ENT;  Laterality: N/A;  . ZENKER'S DIVERTICULECTOMY ENDOSCOPIC  09/09/2015       History reviewed. No pertinent family history.  Social History   Tobacco Use  . Smoking status: Former Smoker    Packs/day: 2.00    Years: 20.00    Pack years: 40.00    Types: Cigarettes, Cigars, Pipe    Quit date: 08/21/1968    Years since quitting: 52.2  . Smokeless tobacco: Never Used  . Tobacco comment: quit smoking in 1980  Substance Use Topics  . Alcohol use: No    Alcohol/week: 0.0 standard drinks  . Drug use: No    Home Medications Prior to Admission medications   Medication Sig Start Date End Date Taking? Authorizing Provider  ascorbic acid (VITAMIN C) 500 MG tablet Take 500 mg by mouth daily.    [provider]  aspirin 81 MG tablet Take 1 tablet (81 mg total) by  mouth daily. 09/09/18   Herminio Commons, MD  carvedilol (COREG) 3.125 MG tablet Take 3.125 mg by mouth 2 (two) times daily. 05/17/20   [provider]  cetirizine (ZYRTEC) 10 MG tablet Take 10 mg by mouth daily.    [provider]  ferrous sulfate 325 (65 FE) MG EC tablet Take 325 mg by mouth daily with breakfast.    [provider]  finasteride (PROSCAR) 5 MG tablet Take 5 mg by mouth daily.    [provider]  losartan (COZAAR) 100 MG tablet Take 100 mg by mouth daily.    [provider]  Misc Natural Products (OSTEO BI-FLEX ADV JOINT SHIELD) TABS Take 1 tablet by mouth daily.     [provider]  Multiple Vitamins-Minerals (CENTRUM SILVER ADULT 50+) TABS Take 1 tablet by mouth every morning.    [provider]  nitroGLYCERIN (NITROSTAT) 0.4 MG SL tablet Place 1 tablet (0.4 mg total) under the tongue every 5 (five) minutes as needed for chest pain. 07/03/14   Herminio Commons, MD  Omega-3 Fatty Acids (FISH OIL) 1000 MG CAPS Take 1 capsule by mouth daily.    [provider]  sertraline (ZOLOFT) 50 MG tablet Take 50 mg by mouth daily. 06/30/19   [provider]  vitamin B-12 (CYANOCOBALAMIN) 1000 MCG tablet 1 tablet (1,000 mcg total) by Per NG tube route daily. Patient taking differently: Take 1,000 mcg by mouth daily.  09/15/15   Izora Gala, MD    Allergies    Patient has no known allergies.  Review of Systems   Review of Systems  Constitutional: Negative for fever.  HENT: Negative for sore throat.   Eyes: Negative for visual disturbance.  Respiratory: Negative for shortness of breath.   Cardiovascular: Negative for chest pain and syncope.  Gastrointestinal: Negative for abdominal pain, diarrhea, nausea and vomiting.  Genitourinary: Negative for dysuria.  Musculoskeletal: Positive for gait problem.  Skin: Negative for rash.  Neurological: Positive for dizziness. Negative for headaches.    Physical  Exam Updated Vital Signs BP (!) 192/77 (BP Location: Right Arm)   Pulse 67   Temp 98.1 F (36.7 C) (Oral)   Resp 20   Ht 6' (1.829 m)   Wt 97.1 kg   SpO2 99%   BMI 29.02 kg/m   Physical Exam Vitals and nursing note reviewed.  Constitutional:      Appearance: Normal appearance. He is well-developed and well-nourished.  HENT:     Head: Normocephalic and atraumatic.  Eyes:     Conjunctiva/sclera: Conjunctivae normal.  Cardiovascular:     Rate and Rhythm: Normal rate and regular rhythm.     Heart sounds: No murmur heard.   Pulmonary:     Effort: Pulmonary effort is normal. No respiratory distress.     Breath sounds: Normal breath sounds.  Abdominal:     Palpations: Abdomen is soft.     Tenderness: There is no abdominal tenderness. There is no guarding or rebound.  Musculoskeletal:        General: No deformity, signs of injury or edema. Normal range of motion.     Cervical back: Neck supple.  Skin:    General: Skin is warm and dry.  Neurological:     General: No focal deficit present.     Mental Status: He is alert. He is disoriented.     Comments: Patient is awake and alert.  He follows commands.  Very hard of hearing.  No facial asymmetry or dysarthric speech.  Extraocular movements intact and tongue midline.  Upper and lower extremity strength without any focal weakness.  Normal sensation.  Gait deferred.  He has not oriented to time and thinks it is Tuesday and February.  Psychiatric:        Mood and Affect: Mood and affect normal.     ED Results / Procedures / Treatments   Labs (all labs ordered are listed, but only abnormal results are displayed) Labs Reviewed  CBC - Abnormal; Notable for the following components:      Result Value   RBC 3.73 (*)    Hemoglobin 10.9 (*)    HCT 34.1 (*)    All other components within normal limits  COMPREHENSIVE METABOLIC PANEL - Abnormal; Notable for the following components:   BUN 24 (*)    Calcium 8.7 (*)    All  other  components within normal limits  URINALYSIS, ROUTINE W REFLEX MICROSCOPIC - Abnormal; Notable for the following components:   Hgb urine dipstick TRACE (*)    Leukocytes,Ua TRACE (*)    All other components within normal limits  URINALYSIS, MICROSCOPIC (REFLEX) - Abnormal; Notable for the following components:   Bacteria, UA RARE (*)    All other components within normal limits  I-STAT CHEM 8, ED - Abnormal; Notable for the following components:   BUN 25 (*)    Hemoglobin 11.9 (*)    HCT 35.0 (*)    All other components within normal limits  RESP PANEL BY RT-PCR (FLU A&B, COVID) ARPGX2  ETHANOL  PROTIME-INR  APTT  DIFFERENTIAL  RAPID URINE DRUG SCREEN, HOSP PERFORMED  HEMOGLOBIN A1C  LIPID PANEL  POC OCCULT BLOOD, ED    EKG EKG Interpretation  Date/Time:  Wednesday October 20 2020 12:25:55 EST Ventricular Rate:  70 PR Interval:    QRS Duration: 106 QT Interval:  406 QTC Calculation: 439 R Axis:   -32 Text Interpretation: Sinus rhythm Atrial premature complex Prolonged PR interval Left axis deviation RSR' in V1 or V2, right VCD or RVH RBBB in prior 2/19 has resolved Confirmed by Aletta Edouard [phone]) on 10/20/2020 12:28:39 PM   Radiology CT Angio Head W or Wo Contrast  Result Date: 10/20/2020 CLINICAL DATA:  Recent fall, dizziness EXAM: CT ANGIOGRAPHY HEAD AND NECK TECHNIQUE: Multidetector CT imaging of the head and neck was performed using the standard protocol during bolus administration of intravenous contrast. Multiplanar CT image reconstructions and MIPs were obtained to evaluate the vascular anatomy. Carotid stenosis measurements (when applicable) are obtained utilizing NASCET criteria, using the distal internal carotid diameter as the denominator. CONTRAST:  155mL OMNIPAQUE IOHEXOL 350 MG/ML SOLN COMPARISON:  None. FINDINGS: CT HEAD Brain: There is no acute intracranial hemorrhage, mass effect, or edema. Gray-white differentiation is preserved. There is no extra-axial fluid  collection. Patchy and confluent areas of hypoattenuation in the supratentorial white matter are nonspecific but probably reflect moderate chronic microvascular ischemic changes. Prominence of the ventricles and sulci reflects generalized parenchymal volume loss. Vascular: No hyperdense vessel. Skull: Unremarkable. Sinuses/Orbits: No acute finding. Other: None. Review of the MIP images confirms the above findings CTA NECK Aortic arch: Mixed calcified plaque along the arch and patent great vessel origins. Right carotid system: Patent. Calcified plaque along the common carotid causing less than 50% stenosis. Mixed but primarily calcified plaque along the proximal internal carotid causing less than 50% stenosis. Left carotid system: Patent. Mixed plaque along the common carotid without measurable stenosis. Calcified plaque along the proximal internal carotid without measurable stenosis. Vertebral arteries: Patent. Calcified plaque near the origins without significant stenosis. Right vertebral artery slightly dominant. No measurable stenosis. Skeleton: Degenerative changes of the cervical spine. Other neck: No mass or adenopathy. Upper chest: No apical lung mass. Subpleural interstitial thickening may reflect fibrosis. Review of the MIP images confirms the above findings CTA HEAD Anterior circulation: Intracranial internal carotid arteries are patent with calcified plaque but no significant stenosis. Anterior cerebral arteries are patent with anterior communicating artery present. Middle cerebral arteries are patent. Posterior circulation: Intracranial vertebral arteries are patent with mild calcified plaque on the left. Basilar artery is patent. Major cerebellar artery branch origins are patent. Posterior cerebral arteries are patent. Venous sinuses: Patent as allowed by contrast bolus timing. Review of the MIP images confirms the above findings IMPRESSION: No acute intracranial abnormality. Chronic microvascular  ischemic changes. No large vessel occlusion or  hemodynamically significant stenosis. No evidence of dissection. Electronically Signed   By: Macy Mis M.D.   On: 10/20/2020 14:25   CT Angio Neck W and/or Wo Contrast  Result Date: 10/20/2020 CLINICAL DATA:  Recent fall, dizziness EXAM: CT ANGIOGRAPHY HEAD AND NECK TECHNIQUE: Multidetector CT imaging of the head and neck was performed using the standard protocol during bolus administration of intravenous contrast. Multiplanar CT image reconstructions and MIPs were obtained to evaluate the vascular anatomy. Carotid stenosis measurements (when applicable) are obtained utilizing NASCET criteria, using the distal internal carotid diameter as the denominator. CONTRAST:  17mL OMNIPAQUE IOHEXOL 350 MG/ML SOLN COMPARISON:  None. FINDINGS: CT HEAD Brain: There is no acute intracranial hemorrhage, mass effect, or edema. Gray-white differentiation is preserved. There is no extra-axial fluid collection. Patchy and confluent areas of hypoattenuation in the supratentorial white matter are nonspecific but probably reflect moderate chronic microvascular ischemic changes. Prominence of the ventricles and sulci reflects generalized parenchymal volume loss. Vascular: No hyperdense vessel. Skull: Unremarkable. Sinuses/Orbits: No acute finding. Other: None. Review of the MIP images confirms the above findings CTA NECK Aortic arch: Mixed calcified plaque along the arch and patent great vessel origins. Right carotid system: Patent. Calcified plaque along the common carotid causing less than 50% stenosis. Mixed but primarily calcified plaque along the proximal internal carotid causing less than 50% stenosis. Left carotid system: Patent. Mixed plaque along the common carotid without measurable stenosis. Calcified plaque along the proximal internal carotid without measurable stenosis. Vertebral arteries: Patent. Calcified plaque near the origins without significant stenosis. Right  vertebral artery slightly dominant. No measurable stenosis. Skeleton: Degenerative changes of the cervical spine. Other neck: No mass or adenopathy. Upper chest: No apical lung mass. Subpleural interstitial thickening may reflect fibrosis. Review of the MIP images confirms the above findings CTA HEAD Anterior circulation: Intracranial internal carotid arteries are patent with calcified plaque but no significant stenosis. Anterior cerebral arteries are patent with anterior communicating artery present. Middle cerebral arteries are patent. Posterior circulation: Intracranial vertebral arteries are patent with mild calcified plaque on the left. Basilar artery is patent. Major cerebellar artery branch origins are patent. Posterior cerebral arteries are patent. Venous sinuses: Patent as allowed by contrast bolus timing. Review of the MIP images confirms the above findings IMPRESSION: No acute intracranial abnormality. Chronic microvascular ischemic changes. No large vessel occlusion or hemodynamically significant stenosis. No evidence of dissection. Electronically Signed   By: Macy Mis M.D.   On: 10/20/2020 14:25    Procedures Procedures   Medications Ordered in ED Medications  aspirin EC tablet 81 mg (81 mg Oral Given 10/20/20 1743)  carvedilol (COREG) tablet 3.125 mg (has no administration in time range)  losartan (COZAAR) tablet 100 mg (100 mg Oral Given 10/20/20 1743)  nitroGLYCERIN (NITROSTAT) SL tablet 0.4 mg (has no administration in time range)  sertraline (ZOLOFT) tablet 50 mg (50 mg Oral Given 10/20/20 1743)  finasteride (PROSCAR) tablet 5 mg (5 mg Oral Given 10/20/20 1743)  ferrous sulfate tablet 325 mg (has no administration in time range)  vitamin B-12 (CYANOCOBALAMIN) tablet 1,000 mcg (1,000 mcg Oral Given 10/20/20 1743)   stroke: mapping our early stages of recovery book (has no administration in time range)  acetaminophen (TYLENOL) tablet 650 mg (has no administration in time range)    Or   acetaminophen (TYLENOL) 160 MG/5ML solution 650 mg (has no administration in time range)    Or  acetaminophen (TYLENOL) suppository 650 mg (has no administration in time range)  senna-docusate (Senokot-S)  tablet 1 tablet (has no administration in time range)  enoxaparin (LOVENOX) injection 40 mg (40 mg Subcutaneous Given 10/20/20 1742)  0.9 %  sodium chloride infusion (has no administration in time range)  iohexol (OMNIPAQUE) 350 MG/ML injection 100 mL (100 mLs Intravenous Contrast Given 10/20/20 1340)  sodium chloride 0.9 % bolus 500 mL (0 mLs Intravenous Stopped 10/20/20 1613)  LORazepam (ATIVAN) injection 1 mg (1 mg Intravenous Given 10/20/20 1620)    ED Course  I have reviewed the triage vital signs and the nursing notes.  Pertinent labs & imaging results that were available during my care of the patient were reviewed by me and considered in my medical decision making (see chart for details).  Clinical Course as of 10/20/20 1749  Wed Oct 20, 2020  1226 Hemoglobin slightly down from baseline.  Rectal exam done with tech as chaperone.  Normal sphincter tone no masses.  Sample sent for guaiac. [MB]  2426 I ordered orthostatics.  The tech said the patient was too unsteady to even sit on the side of the bed.  Guaiac was negative. [MB]  1450 Discussed with Dr. Lorrin Goodell neurology.  He agrees with current work-up and thinks it may still just be peripheral vertigo but at his age he needs an MRI. [MB]  1506 Discussed with Dr. Linda Hedges from [facility] hospitalist will evaluate the patient for admission. [MB]    Clinical Course User Index [MB] Hayden Rasmussen, MD   MDM Rules/Calculators/A&P                         This patient complains of frequent falls, unsteadiness, dizziness; this involves an extensive number of treatment Options and is a complaint that carries with it a high risk of complications and Morbidity. The differential includes stroke, peripheral labyrinthitis, anemia, infection, metabolic  derangement  I ordered, reviewed and interpreted labs, which included CBC with normal white count, hemoglobin slightly down from baseline, chemistries and LFTs normal, Covid testing negative  I ordered imaging studies which included CT angio head and neck and I independently    visualized and interpreted imaging which showed no acute findings Previous records obtained and reviewed in epic, no recent admissions I consulted Dr. Lorrin Goodell neurology and Dr. Linda Hedges [facility] hospitalist and discussed lab and imaging findings  Critical Interventions: None  After the interventions stated above, I reevaluated the patient and found patient still to be symptomatic.  He is pending MRI brain.  Will need admission to the hospital as it is unsafe to ambulate.  Patient in agreement with plan   Final Clinical Impression(s) / ED Diagnoses Final diagnoses:  Dizziness  Unsteady gait  Fall, initial encounter    Rx / DC Orders ED Discharge Orders    None       Hayden Rasmussen, MD 10/20/20 1752

## 2020-10-20 NOTE — ED Notes (Signed)
Pt was unable to sit up or stand at bedside

## 2020-10-21 DIAGNOSIS — R42 Dizziness and giddiness: Secondary | ICD-10-CM | POA: Diagnosis not present

## 2020-10-21 DIAGNOSIS — R270 Ataxia, unspecified: Secondary | ICD-10-CM | POA: Diagnosis not present

## 2020-10-21 DIAGNOSIS — N3000 Acute cystitis without hematuria: Secondary | ICD-10-CM | POA: Diagnosis not present

## 2020-10-21 DIAGNOSIS — R278 Other lack of coordination: Secondary | ICD-10-CM | POA: Diagnosis not present

## 2020-10-21 DIAGNOSIS — I10 Essential (primary) hypertension: Secondary | ICD-10-CM | POA: Diagnosis not present

## 2020-10-21 LAB — HEMOGLOBIN A1C
Hgb A1c MFr Bld: 5.9 % — ABNORMAL HIGH (ref 4.8–5.6)
Mean Plasma Glucose: 122.63 mg/dL

## 2020-10-21 LAB — LIPID PANEL
Cholesterol: 169 mg/dL (ref 0–200)
HDL: 40 mg/dL — ABNORMAL LOW (ref >40)
LDL Cholesterol: 96 mg/dL (ref 0–99)
Total CHOL/HDL Ratio: 4.2 RATIO
Triglycerides: 164 mg/dL — ABNORMAL HIGH (ref <150)
VLDL: 33 mg/dL (ref 0–40)

## 2020-10-21 LAB — TSH: TSH: 2.702 u[IU]/mL (ref 0.350–4.500)

## 2020-10-21 LAB — VITAMIN B12: Vitamin B-12: 359 pg/mL (ref 180–914)

## 2020-10-21 MED ORDER — ASPIRIN EC 325 MG PO TBEC
325.0000 mg | DELAYED_RELEASE_TABLET | Freq: Every day | ORAL | Status: DC
  Administered 2020-10-21 – 2020-10-22 (×2): 325 mg via ORAL
  Filled 2020-10-21 (×2): qty 1

## 2020-10-21 MED ORDER — SODIUM CHLORIDE 0.9 % IV SOLN: INTRAVENOUS | Status: AC

## 2020-10-21 NOTE — Progress Notes (Signed)
Pt. Is continuously trying to get out of bed. Keeps stating "he needs to pee" I reassure him that he has a device catching his urine and that his bed is not wet. Also keeps saying "I need my gun to shoot you in the foot."   Bed alarm is on and fall mats on each side of bed.

## 2020-10-21 NOTE — Care Management Obs Status (Signed)
[facility] NOTIFICATION   Patient Details  Name: Manuel Le MRN: [id number] Date of Birth: 24-Feb-1927   Medicare Observation Status Notification Given:  Yes (copy mailed to healthcare POA [name] as requested at [address] [place], [id number])    Tommy Medal 10/21/2020, 3:13 PM

## 2020-10-21 NOTE — Plan of Care (Signed)
  Problem: Education: Goal: Knowledge of disease or condition will improve Outcome: Progressing Goal: Knowledge of secondary prevention will improve Outcome: Progressing Goal: Knowledge of patient specific risk factors addressed and post discharge goals established will improve Outcome: Progressing Goal: Individualized Educational Video(s) Outcome: Progressing   Problem: Coping: Goal: Will verbalize positive feelings about self Outcome: Progressing   Problem: Self-Care: Goal: Ability to participate in self-care as condition permits will improve Outcome: Progressing

## 2020-10-21 NOTE — Progress Notes (Signed)
PROGRESS NOTE    Patient: Manuel Le                            PCP: Celene Squibb, MD                    DOB: June 04, 1927            DOA: 10/20/2020 ZOX:[id number]             DOS: 10/21/2020, 10:23 AM   LOS: 0 days   Date of Service: The patient was seen and examined on 10/21/2020  Subjective:   The patient was seen and examined this morning. Stable at this time. Still complaining of generalized weaknesses, otherwise awake alert oriented. Not complaining of any change in his speech, or asymmetric weakness. Otherwise no issues overnight .  Brief Narrative:   Manuel Le is a 87 y.o. male with medical history significant of h/o A fib/flutter, bladder ca, CAD, HTN, iron deficiency anemia who  had two episodes of falling out of bed and problems with balance over the past two days. He denies focal weakness, paresthesias, HA, change in speech or other focal symptoms. He presents [facility] for evaluation with a concern for possible CVA.  U/A w/ rare bacteria and 11-2- WBC/hpf. CBC with mild anemia w/o leukocytosis. CTA head and neck negative for LVO or significant stenosis. EGK reveals Sinus Rhythm with PACs  Assessment & Plan:   Active Problems:   HTN (hypertension)   UTI (urinary tract infection)   Truncal ataxia   Dizzy   TIA (transient ischemic attack)  Truncal ataxia -Acute CVA ruled out -CT of the head and MRI of the brain was reviewed in detail -no acute ischemic changes was noted, significant atrophy Neuro - truncal ataxia, imbalance with non-focal exam.  -CTA Head and Neck negative -We will continue to monitor patient on telemetry bed, -PT/OT evaluation for significant generalized weakness -Appreciate neuro input -Increase aspirin from 81 to 325 mg daily -Lipid panel reviewed LDL 96  HTN  - mildly elevated.  -We will monitor closely, orthostatic blood pressure checks  UTI  - mildly positive U/A -He did confirm dysuria, pain burning upon urination -Patient has  been started on ciprofloxacin 250 mg p.o. twice daily-will be continued (Anticipating prolonged antibiotic treatment for possible prostatitis)  History of A. fib/flutter  - h/o a fib/flutter but now in NSR.  -Patient is not on any chronic anticoagulation therapy -We will have the patient follow-up with PCP -Continue aspirin    DVT prophylaxis: lovenox Code Status: DNR  Family Communication: attempted to contact Manuel Le, nephew - no answer.  Manuel Le, another nephew, holds Manuel Le  [phone] called 10/21/20 Disposition Plan: in Am  Hom with [place] called: Neurology-Dr. Lorrin Goodell Admission status: observation-tele    --------------------------------------------------------------------------------------------------------------------------------------- Nutritional status:  The patient's BMI is: Body mass index is 29.02 kg/m. I agree with the assessment and plan as outlined below:   Procedures:   No admission procedures for hospital encounter.    Antimicrobials:  Anti-infectives (From admission, onward)   Start     Dose/Rate Route Frequency Ordered Stop   10/20/20 2000  ciprofloxacin (CIPRO) tablet 250 mg        250 mg Oral 2 times daily 10/20/20 1850 10/25/20 1959       Medication:  .  stroke: mapping our early stages of recovery book   Does not  apply Once  . aspirin EC  325 mg Oral Daily  . carvedilol  3.125 mg Oral BID  . ciprofloxacin  250 mg Oral BID  . enoxaparin (LOVENOX) injection  40 mg Subcutaneous Q24H  . ferrous sulfate  325 mg Oral Q breakfast  . finasteride  5 mg Oral Daily  . losartan  100 mg Oral Daily  . senna-docusate  1 tablet Oral QHS  . sertraline  50 mg Oral Daily  . vitamin B-12  1,000 mcg Oral Daily    acetaminophen **OR** acetaminophen (TYLENOL) oral liquid 160 mg/5 mL **OR** acetaminophen, nitroGLYCERIN   Objective:   Vitals:   10/21/20 0107 10/21/20 0307 10/21/20 0504 10/21/20 1003  BP: (!) 163/67 (!)  163/86 (!) 167/88 (!) 175/86  Pulse: 77 81 77 67  Resp: 20 18 18 18   Temp: 98 F (36.7 C) 97.9 F (36.6 C) 97.7 F (36.5 C) 98.2 F (36.8 C)  TempSrc: Oral Oral Tympanic Oral  SpO2: 95% 95% 100% 96%  Weight:      Height:        Intake/Output Summary (Last 24 hours) at 10/21/2020 1023 Last data filed at 10/21/2020 0900 Gross per 24 hour  Intake 930 ml  Output 900 ml  Net 30 ml   Filed Weights   10/20/20 1113  Weight: 97.1 kg     Examination:   Physical Exam  Constitution:  Alert, cooperative, no distress,  Appears calm and comfortable  Psychiatric: Normal and stable mood and affect, cognition intact,   HEENT: Normocephalic, PERRL, otherwise with in Normal limits  Chest:Chest symmetric Cardio vascular:  S1/S2, RRR, No murmure, No Rubs or Gallops  pulmonary: Clear to auscultation bilaterally, respirations unlabored, negative wheezes / crackles Abdomen: Soft, non-tender, non-distended, bowel sounds,no masses, no organomegaly Muscular skeletal:  Severe generalized weaknesses, ambulates with assist Limited exam - was normal spoke to him today she is more responsive to me in bed, was able to get up at bedside with 1 assist and a walker, able to move all 4 extremities, Normal strength,  Neuro: CNII-XII intact. , normal motor and sensation, reflexes intact  Extremities: No pitting edema lower extremities, +2 pulses  Skin: Dry, warm to touch, negative for any Rashes, No open wounds Wounds: per nursing documentation    ------------------------------------------------------------------------------------------------------------------------------------------    LABs:  CBC Latest Ref Rng & Units 10/20/2020 10/20/2020 09/28/2017  WBC 4.0 - 10.5 K/uL - 6.7 11.7(H)  Hemoglobin 13.0 - 17.0 g/dL 11.9(L) 10.9(L) 13.1  Hematocrit 39.0 - 52.0 % 35.0(L) 34.1(L) 39.6  Platelets 150 - 400 K/uL - 216 201   CMP Latest Ref Rng & Units 10/20/2020 10/20/2020 09/27/2017  Glucose 70 - 99 mg/dL 93 99 113(H)   BUN 8 - 23 mg/dL 25(H) 24(H) 16  Creatinine 0.61 - 1.24 mg/dL 0.90 0.86 0.55(L)  Sodium 135 - 145 mmol/L 138 136 139  Potassium 3.5 - 5.1 mmol/L 4.4 4.4 3.7  Chloride 98 - 111 mmol/L 103 103 106  CO2 22 - 32 mmol/L - 26 22  Calcium 8.9 - 10.3 mg/dL - 8.7(L) 8.1(L)  Total Protein 6.5 - 8.1 g/dL - 7.2 -  Total Bilirubin 0.3 - 1.2 mg/dL - 0.8 -  Alkaline Phos 38 - 126 U/L - 48 -  AST 15 - 41 U/L - 19 -  ALT 0 - 44 U/L - 14 -       Micro Results Recent Results (from the past 240 hour(s))  Resp Panel by RT-PCR (Flu A&B, Covid)  Status: None   Collection Time: 10/20/20 12:17 PM   Specimen: Nasopharyngeal(NP) swabs in vial transport medium  Result Value Ref Range Status   SARS Coronavirus 2 by RT PCR NEGATIVE NEGATIVE Final    Comment: (NOTE) SARS-CoV-2 target nucleic acids are NOT DETECTED.  The SARS-CoV-2 RNA is generally detectable in upper respiratory specimens during the acute phase of infection. The lowest concentration of SARS-CoV-2 viral copies this assay can detect is 138 copies/mL. A negative result does not preclude SARS-Cov-2 infection and should not be used as the sole basis for treatment or other patient management decisions. A negative result may occur with  improper specimen collection/handling, submission of specimen other than nasopharyngeal swab, presence of viral mutation(s) within the areas targeted by this assay, and inadequate number of viral copies(<138 copies/mL). A negative result must be combined with clinical observations, patient history, and epidemiological information. The expected result is Negative.  Fact Sheet for Patients:  EntrepreneurPulse.com.au  Fact Sheet for Healthcare Providers:  IncredibleEmployment.be  This test is no t yet approved or cleared by the [place] FDA and  has been authorized for detection and/or diagnosis of SARS-CoV-2 by FDA under an Emergency Use Authorization (EUA). This  EUA will remain  in effect (meaning this test can be used) for the duration of the COVID-19 declaration under Section 564(b)(1) of the Act, 21 U.S.C.section 360bbb-3(b)(1), unless the authorization is terminated  or revoked sooner.       Influenza A by PCR NEGATIVE NEGATIVE Final   Influenza B by PCR NEGATIVE NEGATIVE Final    Comment: (NOTE) The Xpert Xpress SARS-CoV-2/FLU/RSV plus assay is intended as an aid in the diagnosis of influenza from Nasopharyngeal swab specimens and should not be used as a sole basis for treatment. Nasal washings and aspirates are unacceptable for Xpert Xpress SARS-CoV-2/FLU/RSV testing.  Fact Sheet for Patients: EntrepreneurPulse.com.au  Fact Sheet for Healthcare Providers: IncredibleEmployment.be  This test is not yet approved or cleared by the [place] FDA and has been authorized for detection and/or diagnosis of SARS-CoV-2 by FDA under an Emergency Use Authorization (EUA). This EUA will remain in effect (meaning this test can be used) for the duration of the COVID-19 declaration under Section 564(b)(1) of the Act, 21 U.S.C. section 360bbb-3(b)(1), unless the authorization is terminated or revoked.  Performed at [facility], [address] [place], [place] [address]     Radiology Reports CT Angio Head W or Wo Contrast  Result Date: 10/20/2020 CLINICAL DATA:  Recent fall, dizziness EXAM: CT ANGIOGRAPHY HEAD AND NECK TECHNIQUE: Multidetector CT imaging of the head and neck was performed using the standard protocol during bolus administration of intravenous contrast. Multiplanar CT image reconstructions and MIPs were obtained to evaluate the vascular anatomy. Carotid stenosis measurements (when applicable) are obtained utilizing NASCET criteria, using the distal internal carotid diameter as the denominator. CONTRAST:  131mL OMNIPAQUE IOHEXOL 350 MG/ML SOLN COMPARISON:  None. FINDINGS: CT HEAD Brain: There is no  acute intracranial hemorrhage, mass effect, or edema. Gray-white differentiation is preserved. There is no extra-axial fluid collection. Patchy and confluent areas of hypoattenuation in the supratentorial white matter are nonspecific but probably reflect moderate chronic microvascular ischemic changes. Prominence of the ventricles and sulci reflects generalized parenchymal volume loss. Vascular: No hyperdense vessel. Skull: Unremarkable. Sinuses/Orbits: No acute finding. Other: None. Review of the MIP images confirms the above findings CTA NECK Aortic arch: Mixed calcified plaque along the arch and patent great vessel origins. Right carotid system: Patent. Calcified plaque along the  common carotid causing less than 50% stenosis. Mixed but primarily calcified plaque along the proximal internal carotid causing less than 50% stenosis. Left carotid system: Patent. Mixed plaque along the common carotid without measurable stenosis. Calcified plaque along the proximal internal carotid without measurable stenosis. Vertebral arteries: Patent. Calcified plaque near the origins without significant stenosis. Right vertebral artery slightly dominant. No measurable stenosis. Skeleton: Degenerative changes of the cervical spine. Other neck: No mass or adenopathy. Upper chest: No apical lung mass. Subpleural interstitial thickening may reflect fibrosis. Review of the MIP images confirms the above findings CTA HEAD Anterior circulation: Intracranial internal carotid arteries are patent with calcified plaque but no significant stenosis. Anterior cerebral arteries are patent with anterior communicating artery present. Middle cerebral arteries are patent. Posterior circulation: Intracranial vertebral arteries are patent with mild calcified plaque on the left. Basilar artery is patent. Major cerebellar artery branch origins are patent. Posterior cerebral arteries are patent. Venous sinuses: Patent as allowed by contrast bolus timing.  Review of the MIP images confirms the above findings IMPRESSION: No acute intracranial abnormality. Chronic microvascular ischemic changes. No large vessel occlusion or hemodynamically significant stenosis. No evidence of dissection. Electronically Signed   By: Macy Mis M.D.   On: 10/20/2020 14:25   CT Angio Neck W and/or Wo Contrast  Result Date: 10/20/2020 CLINICAL DATA:  Recent fall, dizziness EXAM: CT ANGIOGRAPHY HEAD AND NECK TECHNIQUE: Multidetector CT imaging of the head and neck was performed using the standard protocol during bolus administration of intravenous contrast. Multiplanar CT image reconstructions and MIPs were obtained to evaluate the vascular anatomy. Carotid stenosis measurements (when applicable) are obtained utilizing NASCET criteria, using the distal internal carotid diameter as the denominator. CONTRAST:  171mL OMNIPAQUE IOHEXOL 350 MG/ML SOLN COMPARISON:  None. FINDINGS: CT HEAD Brain: There is no acute intracranial hemorrhage, mass effect, or edema. Gray-white differentiation is preserved. There is no extra-axial fluid collection. Patchy and confluent areas of hypoattenuation in the supratentorial white matter are nonspecific but probably reflect moderate chronic microvascular ischemic changes. Prominence of the ventricles and sulci reflects generalized parenchymal volume loss. Vascular: No hyperdense vessel. Skull: Unremarkable. Sinuses/Orbits: No acute finding. Other: None. Review of the MIP images confirms the above findings CTA NECK Aortic arch: Mixed calcified plaque along the arch and patent great vessel origins. Right carotid system: Patent. Calcified plaque along the common carotid causing less than 50% stenosis. Mixed but primarily calcified plaque along the proximal internal carotid causing less than 50% stenosis. Left carotid system: Patent. Mixed plaque along the common carotid without measurable stenosis. Calcified plaque along the proximal internal carotid without  measurable stenosis. Vertebral arteries: Patent. Calcified plaque near the origins without significant stenosis. Right vertebral artery slightly dominant. No measurable stenosis. Skeleton: Degenerative changes of the cervical spine. Other neck: No mass or adenopathy. Upper chest: No apical lung mass. Subpleural interstitial thickening may reflect fibrosis. Review of the MIP images confirms the above findings CTA HEAD Anterior circulation: Intracranial internal carotid arteries are patent with calcified plaque but no significant stenosis. Anterior cerebral arteries are patent with anterior communicating artery present. Middle cerebral arteries are patent. Posterior circulation: Intracranial vertebral arteries are patent with mild calcified plaque on the left. Basilar artery is patent. Major cerebellar artery branch origins are patent. Posterior cerebral arteries are patent. Venous sinuses: Patent as allowed by contrast bolus timing. Review of the MIP images confirms the above findings IMPRESSION: No acute intracranial abnormality. Chronic microvascular ischemic changes. No large vessel occlusion or hemodynamically significant stenosis. No  evidence of dissection. Electronically Signed   By: Macy Mis M.D.   On: 10/20/2020 14:25   MR Brain Wo Contrast (neuro protocol)  Result Date: 10/20/2020 CLINICAL DATA:  Initial evaluation for acute neuro deficit, stroke, ataxia. EXAM: MRI HEAD WITHOUT CONTRAST TECHNIQUE: Multiplanar, multiecho pulse sequences of the brain and surrounding structures were obtained without intravenous contrast. COMPARISON:  Prior CT from earlier the same day. FINDINGS: Brain: Diffuse prominence of the CSF containing spaces compatible with moderately advanced cerebral atrophy. Patchy and confluent T2/FLAIR hyperintensity within the periventricular deep white matter both cerebral hemispheres most consistent with chronic small vessel ischemic disease, moderate in nature. Superimposed remote  lacunar infarct at the right thalamus. No abnormal foci of restricted diffusion to suggest acute or subacute ischemia. Gray-white matter differentiation maintained. No encephalomalacia to suggest chronic cortical infarction. No evidence for acute or chronic intracranial hemorrhage. No mass lesion, midline shift or mass effect. No hydrocephalus or extra-axial fluid collection. Pituitary gland suprasellar region normal. Midline structures intact. Vascular: Major intracranial vascular flow voids are maintained. Skull and upper cervical spine: Craniocervical junction within normal limits. Degenerative thickening noted about the tectorial membrane. Diffusely decreased T1 signal intensity seen throughout the visualized bone marrow, nonspecific, but most commonly related to anemia, smoking, or obesity. No visible focal marrow replacing lesion. No scalp soft tissue abnormality. Sinuses/Orbits: Patient status post bilateral ocular lens replacement. Globes and orbital soft tissues demonstrate no acute finding. Scattered mucosal thickening noted throughout the paranasal sinuses. No mastoid effusion. Other: None. IMPRESSION: 1. No acute intracranial abnormality. 2. Moderately advanced cerebral atrophy with chronic small vessel ischemic disease. Small remote lacunar infarct at the right thalamus. Electronically Signed   By: Jeannine Boga M.D.   On: 10/20/2020 19:07    SIGNED: Deatra James, MD, FHM. [facility],  Pager (please use [email or web address] to page/text) Please use Epic Secure Chat for non-urgent communication (7AM-7PM)  If 7PM-7AM, please contact night-coverage [email or web address], 10/21/2020, 10:23 AM

## 2020-10-21 NOTE — Evaluation (Signed)
Physical Therapy Evaluation Patient Details Name: Manuel Le MRN: [id number] DOB: 02-23-1927 Today's Date: 10/21/2020   History of Present Illness  Manuel Le is a 87 y.o. male with medical history significant of h/o A fib/flutter, bladder ca, CAD, HTN, iron deficiency anemia who  had two episodes of falling out of bed and problems with balance over the past two days. He denies focal weakness, paresthesias, HA, change in speech or other focal symptoms. He presents to [facility] for evaluation with a concern for possible CVA.    Clinical Impression  Patient functioning near baseline for functional mobility and gait, has tendency to lean over RW pushing it to far in front was fatigued during gait training, limited secondary to fatigue and tolerated sitting up in chair after therapy.  Orthostatic BP's as follows: supine 160/83, sitting 185/83, and standing 180/99 - MD aware, RN notified.  Patient will benefit from continued physical therapy in hospital and recommended venue below to increase strength, balance, endurance for safe ADLs and gait.     Follow Up Recommendations Home health PT;Supervision for mobility/OOB;Supervision - Intermittent    Equipment Recommendations  None recommended by PT    Recommendations for Other Services       Precautions / Restrictions Precautions Precautions: Fall Restrictions Weight Bearing Restrictions: No      Mobility  Bed Mobility Overal bed mobility: Needs Assistance Bed Mobility: Supine to Sit     Supine to sit: Min assist     General bed mobility comments: increased time, labored movement    Transfers Overall transfer level: Needs assistance Equipment used: Rolling walker (2 wheeled) Transfers: Sit to/from [facility] Sit to Stand: Min guard;Min assist Stand pivot transfers: Min assist       General transfer comment: labored movement, increased time  Ambulation/Gait Ambulation/Gait assistance: Min assist Gait  Distance (Feet): 35 Feet Assistive device: Rolling walker (2 wheeled) Gait Pattern/deviations: Decreased step length - right;Decreased step length - left;Decreased stride length;Trunk flexed Gait velocity: decreased   General Gait Details: slow slabored cadence with tendency to push RW to far in front with flexed trunk once fatigued  Stairs            Wheelchair Mobility    Modified Rankin (Stroke Patients Only)       Balance Overall balance assessment: Needs assistance Sitting-balance support: Feet supported;No upper extremity supported Sitting balance-Leahy Scale: Good Sitting balance - Comments: seated at EOB   Standing balance support: During functional activity;Bilateral upper extremity supported Standing balance-Leahy Scale: Fair Standing balance comment: using RW                             Pertinent Vitals/Pain Pain Assessment: No/denies pain    Home Living Family/patient expects to be discharged to:: Private residence Living Arrangements: Other relatives Available Help at Discharge: Family;Available 24 hours/day Type of Home: House Home Access: Ramped entrance     Home Layout: Two level;Able to live on main level with bedroom/bathroom;Other (Comment) (Patient states she does not go upstairs) Home Equipment: Walker - 2 wheels;Cane - single point;Walker - 4 wheels;Wheelchair - manual      Prior Function Level of Independence: Needs assistance   Gait / Transfers Assistance Needed: household Geologist, engineering with assistance PRN  ADL's / Homemaking Assistance Needed: assisted by family (nephew)        Hand Dominance        Extremity/Trunk Assessment   Upper Extremity Assessment  Upper Extremity Assessment: Defer to OT evaluation    Lower Extremity Assessment Lower Extremity Assessment: Generalized weakness    Cervical / Trunk Assessment Cervical / Trunk Assessment: Kyphotic  Communication   Communication: No difficulties   Cognition Arousal/Alertness: Awake/alert Behavior During Therapy: WFL for tasks assessed/performed Overall Cognitive Status: Within Functional Limits for tasks assessed                                        General Comments      Exercises     Assessment/Plan    PT Assessment Patient needs continued PT services  PT Problem List Decreased strength;Decreased activity tolerance;Decreased balance;Decreased mobility       PT Treatment Interventions DME instruction;Gait training;Stair training;Functional mobility training;Therapeutic activities;Therapeutic exercise;Patient/family education;Balance training    PT Goals (Current goals can be found in the Care Plan section)  Acute Rehab PT Goals Patient Stated Goal: return home with family to assist PT Goal Formulation: With patient Time For Goal Achievement: 10/28/20 Potential to Achieve Goals: Good    Frequency Min 3X/week   Barriers to discharge        Co-evaluation               AM-PAC PT "6 Clicks" Mobility  Outcome Measure Help needed turning from your back to your side while in a flat bed without using bedrails?: None Help needed moving from lying on your back to sitting on the side of a flat bed without using bedrails?: A Little Help needed moving to and from a bed to a chair (including a wheelchair)?: A Little Help needed standing up from a chair using your arms (e.g., wheelchair or bedside chair)?: A Little Help needed to walk in hospital room?: A Lot Help needed climbing 3-5 steps with a railing? : A Lot 6 Click Score: 17    End of Session   Activity Tolerance: Patient tolerated treatment well;Patient limited by fatigue Patient left: in chair;with call bell/phone within reach Nurse Communication: Mobility status PT Visit Diagnosis: Unsteadiness on feet (R26.81);Other abnormalities of gait and mobility (R26.89);Muscle weakness (generalized) (M62.81)    Time: 8309-4076 PT Time Calculation  (min) (ACUTE ONLY): 30 min   Charges:   PT Evaluation $PT Eval Moderate Complexity: 1 Mod PT Treatments $Therapeutic Activity: 23-37 mins        11:46 AM, 10/21/20 [name], MPT Physical Therapist with [facility] 336 [phone] office [phone] mobile phone

## 2020-10-21 NOTE — Plan of Care (Signed)
  Problem: Education: Goal: Knowledge of disease or condition will improve Outcome: Progressing Goal: Knowledge of secondary prevention will improve Outcome: Progressing Goal: Knowledge of patient specific risk factors addressed and post discharge goals established will improve Outcome: Progressing Goal: Individualized Educational Video(s) Outcome: Progressing   Problem: Coping: Goal: Will verbalize positive feelings about self Outcome: Progressing   Problem: Self-Care: Goal: Ability to participate in self-care as condition permits will improve Outcome: Progressing   Problem: Education: Goal: Knowledge of General Education information will improve Description: Including pain rating scale, medication(s)/side effects and non-pharmacologic comfort measures Outcome: Progressing   Problem: Health Behavior/Discharge Planning: Goal: Ability to manage health-related needs will improve Outcome: Progressing   Problem: Clinical Measurements: Goal: Ability to maintain clinical measurements within normal limits will improve Outcome: Progressing Goal: Will remain free from infection Outcome: Progressing Goal: Diagnostic test results will improve Outcome: Progressing Goal: Respiratory complications will improve Outcome: Progressing Goal: Cardiovascular complication will be avoided Outcome: Progressing   Problem: Activity: Goal: Risk for activity intolerance will decrease Outcome: Progressing   Problem: Nutrition: Goal: Adequate nutrition will be maintained Outcome: Progressing   Problem: Coping: Goal: Level of anxiety will decrease Outcome: Progressing   Problem: Elimination: Goal: Will not experience complications related to bowel motility Outcome: Progressing Goal: Will not experience complications related to urinary retention Outcome: Progressing   Problem: Pain Managment: Goal: General experience of comfort will improve Outcome: Progressing   Problem: Safety: Goal:  Ability to remain free from injury will improve Outcome: Progressing   Problem: Skin Integrity: Goal: Risk for impaired skin integrity will decrease Outcome: Progressing

## 2020-10-21 NOTE — Plan of Care (Signed)
  Problem: Acute Rehab PT Goals(only PT should resolve) Goal: Pt Will Go Supine/Side To Sit Outcome: Progressing Flowsheets (Taken 10/21/2020 1148) Pt will go Supine/Side to Sit:  with modified independence  with supervision Goal: Patient Will Transfer Sit To/From Stand Outcome: Progressing Flowsheets (Taken 10/21/2020 1148) Patient will transfer sit to/from stand:  with modified independence  with supervision Goal: Pt Will Transfer Bed To Chair/Chair To Bed Outcome: Progressing Flowsheets (Taken 10/21/2020 1148) Pt will Transfer Bed to Chair/Chair to Bed: with supervision Goal: Pt Will Ambulate Outcome: Progressing Flowsheets (Taken 10/21/2020 1148) Pt will Ambulate:  50 feet  with supervision  with min guard assist  with rolling walker   11:48 AM, 10/21/20 [name], MPT Physical Therapist with [facility] [phone] office [phone] mobile phone

## 2020-10-22 DIAGNOSIS — R278 Other lack of coordination: Secondary | ICD-10-CM | POA: Diagnosis not present

## 2020-10-22 DIAGNOSIS — N3000 Acute cystitis without hematuria: Secondary | ICD-10-CM | POA: Diagnosis not present

## 2020-10-22 DIAGNOSIS — I10 Essential (primary) hypertension: Secondary | ICD-10-CM | POA: Diagnosis not present

## 2020-10-22 DIAGNOSIS — R42 Dizziness and giddiness: Secondary | ICD-10-CM | POA: Diagnosis not present

## 2020-10-22 DIAGNOSIS — R270 Ataxia, unspecified: Secondary | ICD-10-CM | POA: Diagnosis not present

## 2020-10-22 LAB — BASIC METABOLIC PANEL
Anion gap: 9 (ref 5–15)
BUN: 18 mg/dL (ref 8–23)
CO2: 22 mmol/L (ref 22–32)
Calcium: 9.1 mg/dL (ref 8.9–10.3)
Chloride: 107 mmol/L (ref 98–111)
Creatinine, Ser: 0.87 mg/dL (ref 0.61–1.24)
GFR, Estimated: >60 mL/min (ref >60)
Glucose, Bld: 120 mg/dL — ABNORMAL HIGH (ref 70–99)
Potassium: 4.1 mmol/L (ref 3.5–5.1)
Sodium: 138 mmol/L (ref 135–145)

## 2020-10-22 MED ORDER — LACTINEX PO CHEW: 1.0000 | CHEWABLE_TABLET | Freq: Three times a day (TID) | ORAL | 0 refills | Status: AC

## 2020-10-22 MED ORDER — CIPROFLOXACIN HCL 250 MG PO TABS
250.0000 mg | ORAL_TABLET | Freq: Two times a day (BID) | ORAL | 0 refills | Status: AC
Start: 2020-10-22 — End: 2020-11-01

## 2020-10-22 NOTE — Progress Notes (Signed)
Physical Therapy Treatment Patient Details Name: Manuel Le MRN: [id number] DOB: 02-12-1927 Today's Date: 10/22/2020    History of Present Illness Manuel Le is a 87 y.o. male with medical history significant of h/o A fib/flutter, bladder ca, CAD, HTN, iron deficiency anemia who  had two episodes of falling out of bed and problems with balance over the past two days. He denies focal weakness, paresthesias, HA, change in speech or other focal symptoms. He presents to [facility] for evaluation with a concern for possible CVA.    PT Comments    Patient just waking up upon entrance for session. Patient requires min/mod assist to pull to seated to transition to seated EOB. Patient requiring support initially for sitting balance secondary to lightheadedness which resolves after several minutes of sitting. Patient requires reduced to no support for sitting after that time. Patient attempted to transfer to standing with RW and mod assist and is unsuccessful first attempt but is able to following. He is limited on second attempt by impaired standing tolerance. 3rd attempt patient able to remain standing with cueing for RW use and posture. Patient ambulates to chair with slow, labored cadence with use of RW and assist. Patient provided with frequent cueing for sequencing throughout session. He ends session seated in chair at bedside. Patient will benefit from continued physical therapy in hospital and recommended venue below to increase strength, balance, endurance for safe ADLs and gait.   Follow Up Recommendations  Home health PT;Supervision for mobility/OOB;Supervision - Intermittent     Equipment Recommendations  None recommended by PT    Recommendations for Other Services       Precautions / Restrictions Precautions Precautions: Fall Restrictions Weight Bearing Restrictions: No    Mobility  Bed Mobility Overal bed mobility: Needs Assistance Bed Mobility: Supine to Sit     Supine to  sit: Min assist;Mod assist     General bed mobility comments: increased time, labored movement, assist to pull to seated    Transfers Overall transfer level: Needs assistance Equipment used: Rolling walker (2 wheeled) Transfers: Sit to/from Stand Sit to Stand: Mod assist Stand pivot transfers: Min assist       General transfer comment: Mod assist for sit to stand using RW and gait belt, frequent cueing for sequencing, improving ability to stand after several minutes of standing, STS 3x with first attempt unsuccessful  Ambulation/Gait Ambulation/Gait assistance: Min assist;Mod assist Gait Distance (Feet): 3 Feet Assistive device: Rolling walker (2 wheeled) Gait Pattern/deviations: Decreased step length - right;Decreased step length - left;Decreased stride length;Trunk flexed Gait velocity: decreased   General Gait Details: slow labored cadence with RW, frequent cueing for sequencing   Stairs             Wheelchair Mobility    Modified Rankin (Stroke Patients Only)       Balance Overall balance assessment: Needs assistance Sitting-balance support: Feet supported;No upper extremity supported Sitting balance-Leahy Scale: Fair Sitting balance - Comments: fair/good seated EOB   Standing balance support: Bilateral upper extremity supported Standing balance-Leahy Scale: Poor Standing balance comment: fair/poor using RW                            Cognition Arousal/Alertness: Awake/alert Behavior During Therapy: WFL for tasks assessed/performed Overall Cognitive Status: Within Functional Limits for tasks assessed  Exercises      General Comments        Pertinent Vitals/Pain Pain Assessment: No/denies pain    Home Living Family/patient expects to be discharged to:: Private residence Living Arrangements: Other relatives Available Help at Discharge: Family;Available 24 hours/day Type of Home:  House Home Access: Ramped entrance   Home Layout: Two level;Able to live on main level with bedroom/bathroom;Other (Comment) (3 stairs with rails both sides to enter home) Home Equipment: Walker - 4 wheels;Cane - single point;Wheelchair - manual;Bedside commode      Prior Function Level of Independence: Needs assistance  Gait / Transfers Assistance Needed: household ambulator using Rollator with assistance; reported typically transfers when assist present. ADL's / Homemaking Assistance Needed: assisted by family (nephew)     PT Goals (current goals can now be found in the care plan section) Acute Rehab PT Goals Patient Stated Goal: return home with family to assist PT Goal Formulation: With patient Time For Goal Achievement: 10/28/20 Potential to Achieve Goals: Good Progress towards PT goals: Progressing toward goals    Frequency    Min 3X/week      PT Plan Current plan remains appropriate    Co-evaluation              AM-PAC PT "6 Clicks" Mobility   Outcome Measure  Help needed turning from your back to your side while in a flat bed without using bedrails?: None Help needed moving from lying on your back to sitting on the side of a flat bed without using bedrails?: A Little Help needed moving to and from a bed to a chair (including a wheelchair)?: A Little Help needed standing up from a chair using your arms (e.g., wheelchair or bedside chair)?: A Little Help needed to walk in hospital room?: A Lot Help needed climbing 3-5 steps with a railing? : A Lot 6 Click Score: 17    End of Session   Activity Tolerance: Patient tolerated treatment well;Patient limited by fatigue Patient left: in chair;with call bell/phone within reach Nurse Communication: Mobility status PT Visit Diagnosis: Unsteadiness on feet (R26.81);Other abnormalities of gait and mobility (R26.89);Muscle weakness (generalized) (M62.81)     Time: 9407-6808 PT Time Calculation (min) (ACUTE ONLY): 17  min  Charges:  $Therapeutic Activity: 8-22 mins                     10:38 AM, 10/22/20 Mearl Latin PT, DPT Physical Therapist at [facility]

## 2020-10-22 NOTE — Evaluation (Signed)
Occupational Therapy Evaluation Patient Details Name: Manuel Le MRN: [id number] DOB: [birth year] Today's Date: 10/22/2020    History of Present Illness Manuel Le is a 87 y.o. male with medical history significant of h/o A fib/flutter, bladder ca, CAD, HTN, iron deficiency anemia who  had two episodes of falling out of bed and problems with balance over the past two days. He denies focal weakness, paresthesias, HA, change in speech or other focal symptoms. He presents to [facility] for evaluation with a concern for possible CVA.   Clinical Impression   Pt agreeable to OT evaluation. Pt able to eat breakfast seated in chair with MOD I. Pt demonstrated sit to stand with Moderate assist progressing to minimal once standing. Able to stand for ~5 to 10 seconds before seeking to return to chair. Pt would benefit from further acute OT services to improve functional strength and endurance to improve ADL independence.     Follow Up Recommendations  Home health OT;Supervision/Assistance - 24 hour    Equipment Recommendations  None recommended by OT           Precautions / Restrictions Precautions Precautions: Fall Restrictions Weight Bearing Restrictions: No      Mobility Bed Mobility                    Transfers Overall transfer level: Needs assistance Equipment used: Rolling walker (2 wheeled) Transfers: Sit to/from Stand Sit to Stand: Mod assist         General transfer comment: Mod assist for sit to stand using RW and gait belt. Minimal assist once in standing to remain for 5 to 10 seconds.    Balance Overall balance assessment: Needs assistance         Standing balance support: Bilateral upper extremity supported;During functional activity (RW) Standing balance-Leahy Scale: Poor (Poor to fair) Standing balance comment: using RW                           ADL either performed or assessed with clinical judgement   ADL Overall ADL's : Needs  assistance/impaired Eating/Feeding: Mod I;Sitting                                           Vision Baseline Vision/History: Wears glasses                  Pertinent Vitals/Pain Pain Assessment: No/denies pain     Hand Dominance Right   Extremity/Trunk Assessment Upper Extremity Assessment Upper Extremity Assessment: Generalized weakness   Lower Extremity Assessment Lower Extremity Assessment: Defer to PT evaluation   Cervical / Trunk Assessment Cervical / Trunk Assessment: Kyphotic   Communication Communication Communication: No difficulties   Cognition Arousal/Alertness: Awake/alert Behavior During Therapy: WFL for tasks assessed/performed Overall Cognitive Status: Within Functional Limits for tasks assessed                                                      Home Living Family/patient expects to be discharged to:: Private residence Living Arrangements: Other relatives Available Help at Discharge: Family;Available 24 hours/day Type of Home: House Home Access: Ramped entrance     Home Layout: Two level;Able to  live on main level with bedroom/bathroom;Other (Comment) (3 stairs with rails both sides to enter home) Alternate Level Stairs-Number of Steps: 10 Alternate Level Stairs-Rails: Right;Left;Can reach both (per doc review) Bathroom Shower/Tub: Teacher, early years/pre: Standard Bathroom Accessibility: Yes How Accessible: Accessible via walker;Accessible via wheelchair Home Equipment: Gilford Rile - 4 wheels;Cane - single point;Wheelchair - manual;Bedside commode          Prior Functioning/Environment Level of Independence: Needs assistance  Gait / Transfers Assistance Needed: household ambulator using Rollator with assistance; reported typically transfers when assist present. ADL's / Homemaking Assistance Needed: assisted by family (nephew)            OT Problem List: Decreased strength;Decreased  activity tolerance;Decreased cognition      OT Treatment/Interventions: Self-care/ADL training;Therapeutic exercise;Therapeutic activities;Balance training    OT Goals(Current goals can be found in the care plan section) Acute Rehab OT Goals Patient Stated Goal: return home with family to assist OT Goal Formulation: With patient Time For Goal Achievement: 11/05/20 Potential to Achieve Goals: Fair  OT Frequency: Min 2X/week    AM-PAC OT "6 Clicks" Daily Activity     Outcome Measure Help from another person eating meals?: A Little Help from another person taking care of personal grooming?: A Little Help from another person toileting, which includes using toliet, bedpan, or urinal?: A Lot Help from another person bathing (including washing, rinsing, drying)?: A Lot Help from another person to put on and taking off regular upper body clothing?: A Little Help from another person to put on and taking off regular lower body clothing?: A Lot 6 Click Score: 15   End of Session Equipment Utilized During Treatment: Gait belt;Rolling walker Nurse Communication: Mobility status  Activity Tolerance: Patient tolerated treatment well Patient left: in chair;with call bell/phone within reach;with chair alarm set  OT Visit Diagnosis: Unsteadiness on feet (R26.81);Repeated falls (R29.6);History of falling (Z91.81);Muscle weakness (generalized) (M62.81)                Time: 0867-6195 OT Time Calculation (min): 23 min Charges:  OT General Charges $OT Visit: 1 Visit OT Evaluation $OT Eval Low Complexity: [place] OT, MOT   Larey Seat 10/22/2020, 10:22 AM

## 2020-10-22 NOTE — Progress Notes (Signed)
Discussed all findings plan of care and discharge with patient's nephew POA  Would like to be called by nursing staff at [phone] To discuss discharge and transportation

## 2020-10-22 NOTE — Plan of Care (Signed)
  Problem: Education: Goal: Knowledge of disease or condition will improve Outcome: Adequate for Discharge Goal: Knowledge of secondary prevention will improve Outcome: Adequate for Discharge Goal: Knowledge of patient specific risk factors addressed and post discharge goals established will improve Outcome: Adequate for Discharge Goal: Individualized Educational Video(s) Outcome: Adequate for Discharge   Problem: Coping: Goal: Will verbalize positive feelings about self Outcome: Adequate for Discharge   Problem: Self-Care: Goal: Ability to participate in self-care as condition permits will improve Outcome: Adequate for Discharge   Problem: Education: Goal: Knowledge of General Education information will improve Description: Including pain rating scale, medication(s)/side effects and non-pharmacologic comfort measures Outcome: Adequate for Discharge   Problem: Health Behavior/Discharge Planning: Goal: Ability to manage health-related needs will improve Outcome: Adequate for Discharge   Problem: Clinical Measurements: Goal: Ability to maintain clinical measurements within normal limits will improve Outcome: Adequate for Discharge Goal: Will remain free from infection Outcome: Adequate for Discharge Goal: Diagnostic test results will improve Outcome: Adequate for Discharge Goal: Respiratory complications will improve Outcome: Adequate for Discharge Goal: Cardiovascular complication will be avoided Outcome: Adequate for Discharge   Problem: Activity: Goal: Risk for activity intolerance will decrease Outcome: Adequate for Discharge   Problem: Nutrition: Goal: Adequate nutrition will be maintained Outcome: Adequate for Discharge   Problem: Coping: Goal: Level of anxiety will decrease Outcome: Adequate for Discharge   Problem: Elimination: Goal: Will not experience complications related to bowel motility Outcome: Adequate for Discharge Goal: Will not experience  complications related to urinary retention Outcome: Adequate for Discharge   Problem: Pain Managment: Goal: General experience of comfort will improve Outcome: Adequate for Discharge   Problem: Safety: Goal: Ability to remain free from injury will improve Outcome: Adequate for Discharge   Problem: Skin Integrity: Goal: Risk for impaired skin integrity will decrease Outcome: Adequate for Discharge

## 2020-10-22 NOTE — TOC Initial Note (Signed)
Transition of Care [facility]) - Initial/Assessment Note    Patient Details  Name: Manuel Le MRN: [id number] Date of Birth: 1927-06-24  Transition of [facility]) CM/SW Contact:    Natasha Bence, LCSW Phone Number: 10/22/2020, 12:47 PM  Clinical Narrative:                 CSW notified of patient's readiness for discharge. HH ordered for patient. CS[facility] to [facility] with [facility] agreeable to to take patient. TOC signing off.     Barriers to Discharge: Barriers Resolved   Patient Goals and CMS Choice Patient states their goals for this hospitalization and ongoing recovery are:: [facility] home with [facility] CMS Medicare.gov Compare Post Acute Care list provided to:: Patient Choice offered to / list presented to : Patient  Expected Discharge Plan and Services           Expected Discharge Date: 10/22/20               DME Arranged: N/A DME Agency: NA       HH Arranged: RN,PT,OT,Nurse's Aide [place] Agency: [place] ([place]) Date [place]: 10/22/20 Time [place]: 1244 Representative spoke with at [place]: Vaughan Basta  Prior Living Arrangements/Services                       Activities of Daily Living Home Assistive Devices/Equipment: Environmental consultant (specify type) ADL Screening (condition at time of admission) Patient's cognitive ability adequate to safely complete daily activities?: No Is the patient deaf or have difficulty hearing?: Yes Does the patient have difficulty seeing, even when wearing glasses/contacts?: No Does the patient have difficulty concentrating, remembering, or making decisions?: Yes Patient able to express need for assistance with ADLs?: Yes Does the patient have difficulty dressing or bathing?: Yes Independently performs ADLs?: No Communication: Independent Dressing (OT): Needs assistance Is this a change from baseline?: Pre-admission baseline Grooming: Needs assistance Is this a change from baseline?:  Pre-admission baseline Feeding: Independent Bathing: Needs assistance Is this a change from baseline?: Pre-admission baseline Toileting: Independent with device (comment) (walker) In/Out Bed: Needs assistance Is this a change from baseline?: Pre-admission baseline Walks in Home: Independent with device (comment) (walker) Does the patient have difficulty walking or climbing stairs?: Yes Weakness of Legs: Both Weakness of Arms/Hands: Both  Permission Sought/Granted                  Emotional Assessment              Admission diagnosis:  TIA (transient ischemic attack) [G45.9] Dizziness [R42] Unsteady gait [R26.81] Fall, initial encounter [id number]] Patient Active Problem List   Diagnosis Date Noted  . Truncal ataxia 10/20/2020  . Dizzy 10/20/2020  . TIA (transient ischemic attack) 10/20/2020  . Acute respiratory failure with hypoxia ([place])   . Influenza with pneumonia   . Palliative care encounter   . Sepsis ([place]) 09/25/2017  . Fever 09/25/2017  . UTI (urinary tract infection) 09/25/2017  . Abnormal liver function 09/25/2017  . Tachycardia 09/25/2017  . Influenza A   . Pressure ulcer 09/14/2015  . Zenker's diverticulum 09/09/2015  . Bladder cancer ([place]) 07/14/2015  . CAD (coronary artery disease) 07/24/2014  . S/P coronary artery stent placement (RCA bare metal stent on 07/08/14) 07/24/2014  . Exertional angina ([place]) 07/08/2014  . Encounter for therapeutic drug monitoring 09/12/2013  . Atrial fibrillation ([place]) 09/08/2013  . HTN (hypertension) 09/08/2013  . Fatigue 09/08/2013  . Anemia 06/26/2012  PCP:  Celene Squibb, MD Pharmacy:   [facility] [address] [place] [address] Phone: [phone] Fax: [phone]     Social Determinants of Health (SDOH) Interventions    Readmission Risk Interventions No flowsheet data found.

## 2020-10-22 NOTE — Discharge Summary (Signed)
Physician Discharge Summary [facility]    Patient: Manuel Le                   Admit date: 10/20/2020   DOB: 09-03-1926             Discharge date:10/22/2020/8:00 AM [id number]                          PCP: Celene Squibb, MD  Disposition: HOME with Home Health   Recommendations for Outpatient Follow-up:   . Follow up: in 2 weeks  Discharge Condition: Stable   Code Status:   Code Status: DNR  Diet recommendation: Regular healthy diet   Discharge Diagnoses:    Active Problems:   HTN (hypertension)   UTI (urinary tract infection)   Truncal ataxia   Dizzy   TIA (transient ischemic attack)   History of Present Illness/ Hospital Course Manuel Le Summary:    Manuel Le a 87 y.o.malewith medical history significant ofh/o A fib/flutter, bladder ca, CAD, HTN, iron deficiency anemiawhohad two episodes of falling out of bed and problems with balance over the past two days. He denies focal weakness, paresthesias, HA, change in speech or other focal symptoms. He presents to [facility] for evaluation with a concern for possible CVA.  U/A w/ rare bacteria and 11-2- WBC/hpf. CBC with mild anemia w/o leukocytosis. CTA head and neck negative for LVO or significant stenosis. EGK reveals Sinus Rhythm with PACs  Assessment & Plan:   Active Problems:   HTN (hypertension)   UTI (urinary tract infection)   Truncal ataxia   Dizzy   TIA (transient ischemic attack)  Truncal ataxia -Acute CVA ruled out -CT of the head and MRI of the brain was reviewed in detail -no acute ischemic changes was noted, significant atrophy Neuro - truncal ataxia, imbalance with no other-focal findings onexam.  -CTA Head and Neck negative -PT/OT evaluation recommending home health-Appreciate neuro input -Continue aspirin -Lipid panel reviewed LDL 96  HTN  - mildly elevated. ....  Blood pressure stabilized 135/80 this morning -We will monitor closely, orthostatic blood pressure  checks  UTI  - mildly positive U/A -He did confirm dysuria, pain burning upon urination -Patient has been started on ciprofloxacin 250 mg p.o. twice daily-will be continued (Anticipating prolonged antibiotic treatment for possible prostatitis) -Urine cultures no growth to date  History of A. fib/flutter  - h/o a fib/flutter but now in NSR.  -Patient is not on any chronic anticoagulation therapy -We will have the patient follow-up with PCP -Continue aspirin     Code Status:DNR Family Communication:attempted to contact Haze Justin, nephew - no answer.  Stephannie Li, another nephew, Kathi Der [phone] called 10/21/20 Disposition Plan: Hom with [facility] called:Neurology-Dr. Lorrin Goodell  Nutritional status:  The patient's BMI is: Body mass index is 29.02 kg/m. I agree with the assessment and plan as outlined ---  Discharge Instructions:   Discharge Instructions    Activity as tolerated - No restrictions   Complete by: As directed    Diet - low sodium heart healthy   Complete by: As directed    Discharge instructions   Complete by: As directed    F/up with PCP ASAP .Marland KitchenMarland Kitchen Home health and Social worker at home,   Increase activity slowly   Complete by: As directed        Medication List    STOP taking these medications   loratadine 10 MG tablet  Commonly known as: CLARITIN     TAKE these medications   ascorbic acid 500 MG tablet Commonly known as: VITAMIN C Take 500 mg by mouth daily.   aspirin 81 MG tablet Take 1 tablet (81 mg total) by mouth daily.   carvedilol 3.125 MG tablet Commonly known as: COREG Take 3.125 mg by mouth 2 (two) times daily.   Centrum Silver Adult 50+ Tabs Take 1 tablet by mouth every morning.   ciprofloxacin 250 MG tablet Commonly known as: CIPRO Take 1 tablet (250 mg total) by mouth 2 (two) times daily for 10 days.   ferrous sulfate 325 (65 FE) MG EC tablet Take 325 mg by mouth daily with breakfast.    finasteride 5 MG tablet Commonly known as: PROSCAR Take 5 mg by mouth daily.   Fish Oil 1000 MG Caps Take 1 capsule by mouth daily.   furosemide 20 MG tablet Commonly known as: LASIX Take 20 mg by mouth daily.   lactobacillus acidophilus & bulgar chewable tablet Chew 1 tablet by mouth 3 (three) times daily with meals for 10 days.   losartan 100 MG tablet Commonly known as: COZAAR Take 100 mg by mouth daily.   nitroGLYCERIN 0.4 MG SL tablet Commonly known as: NITROSTAT Place 1 tablet (0.4 mg total) under the tongue every 5 (five) minutes as needed for chest pain.   Osteo Bi-Flex Adv Joint Shield Tabs Take 1 tablet by mouth daily.   potassium chloride 10 MEQ tablet Commonly known as: KLOR-CON Take 10 mEq by mouth daily.   sertraline 50 MG tablet Commonly known as: ZOLOFT Take 50 mg by mouth daily.   vitamin B-12 1000 MCG tablet Commonly known as: CYANOCOBALAMIN 1 tablet (1,000 mcg total) by Per NG tube route daily. What changed: how to take this       No Known Allergies   Procedures /Studies:   CT Angio Head W or Wo Contrast  Result Date: 10/20/2020 CLINICAL DATA:  Recent fall, dizziness EXAM: CT ANGIOGRAPHY HEAD AND NECK TECHNIQUE: Multidetector CT imaging of the head and neck was performed using the standard protocol during bolus administration of intravenous contrast. Multiplanar CT image reconstructions and MIPs were obtained to evaluate the vascular anatomy. Carotid stenosis measurements (when applicable) are obtained utilizing NASCET criteria, using the distal internal carotid diameter as the denominator. CONTRAST:  113mL OMNIPAQUE IOHEXOL 350 MG/ML SOLN COMPARISON:  None. FINDINGS: CT HEAD Brain: There is no acute intracranial hemorrhage, mass effect, or edema. Gray-white differentiation is preserved. There is no extra-axial fluid collection. Patchy and confluent areas of hypoattenuation in the supratentorial white matter are nonspecific but probably reflect  moderate chronic microvascular ischemic changes. Prominence of the ventricles and sulci reflects generalized parenchymal volume loss. Vascular: No hyperdense vessel. Skull: Unremarkable. Sinuses/Orbits: No acute finding. Other: None. Review of the MIP images confirms the above findings CTA NECK Aortic arch: Mixed calcified plaque along the arch and patent great vessel origins. Right carotid system: Patent. Calcified plaque along the common carotid causing less than 50% stenosis. Mixed but primarily calcified plaque along the proximal internal carotid causing less than 50% stenosis. Left carotid system: Patent. Mixed plaque along the common carotid without measurable stenosis. Calcified plaque along the proximal internal carotid without measurable stenosis. Vertebral arteries: Patent. Calcified plaque near the origins without significant stenosis. Right vertebral artery slightly dominant. No measurable stenosis. Skeleton: Degenerative changes of the cervical spine. Other neck: No mass or adenopathy. Upper chest: No apical lung mass. Subpleural interstitial thickening may reflect fibrosis.  Review of the MIP images confirms the above findings CTA HEAD Anterior circulation: Intracranial internal carotid arteries are patent with calcified plaque but no significant stenosis. Anterior cerebral arteries are patent with anterior communicating artery present. Middle cerebral arteries are patent. Posterior circulation: Intracranial vertebral arteries are patent with mild calcified plaque on the left. Basilar artery is patent. Major cerebellar artery branch origins are patent. Posterior cerebral arteries are patent. Venous sinuses: Patent as allowed by contrast bolus timing. Review of the MIP images confirms the above findings IMPRESSION: No acute intracranial abnormality. Chronic microvascular ischemic changes. No large vessel occlusion or hemodynamically significant stenosis. No evidence of dissection. Electronically Signed    By: Macy Mis M.D.   On: 10/20/2020 14:25   CT Angio Neck W and/or Wo Contrast  Result Date: 10/20/2020 CLINICAL DATA:  Recent fall, dizziness EXAM: CT ANGIOGRAPHY HEAD AND NECK TECHNIQUE: Multidetector CT imaging of the head and neck was performed using the standard protocol during bolus administration of intravenous contrast. Multiplanar CT image reconstructions and MIPs were obtained to evaluate the vascular anatomy. Carotid stenosis measurements (when applicable) are obtained utilizing NASCET criteria, using the distal internal carotid diameter as the denominator. CONTRAST:  19mL OMNIPAQUE IOHEXOL 350 MG/ML SOLN COMPARISON:  None. FINDINGS: CT HEAD Brain: There is no acute intracranial hemorrhage, mass effect, or edema. Gray-white differentiation is preserved. There is no extra-axial fluid collection. Patchy and confluent areas of hypoattenuation in the supratentorial white matter are nonspecific but probably reflect moderate chronic microvascular ischemic changes. Prominence of the ventricles and sulci reflects generalized parenchymal volume loss. Vascular: No hyperdense vessel. Skull: Unremarkable. Sinuses/Orbits: No acute finding. Other: None. Review of the MIP images confirms the above findings CTA NECK Aortic arch: Mixed calcified plaque along the arch and patent great vessel origins. Right carotid system: Patent. Calcified plaque along the common carotid causing less than 50% stenosis. Mixed but primarily calcified plaque along the proximal internal carotid causing less than 50% stenosis. Left carotid system: Patent. Mixed plaque along the common carotid without measurable stenosis. Calcified plaque along the proximal internal carotid without measurable stenosis. Vertebral arteries: Patent. Calcified plaque near the origins without significant stenosis. Right vertebral artery slightly dominant. No measurable stenosis. Skeleton: Degenerative changes of the cervical spine. Other neck: No mass or  adenopathy. Upper chest: No apical lung mass. Subpleural interstitial thickening may reflect fibrosis. Review of the MIP images confirms the above findings CTA HEAD Anterior circulation: Intracranial internal carotid arteries are patent with calcified plaque but no significant stenosis. Anterior cerebral arteries are patent with anterior communicating artery present. Middle cerebral arteries are patent. Posterior circulation: Intracranial vertebral arteries are patent with mild calcified plaque on the left. Basilar artery is patent. Major cerebellar artery branch origins are patent. Posterior cerebral arteries are patent. Venous sinuses: Patent as allowed by contrast bolus timing. Review of the MIP images confirms the above findings IMPRESSION: No acute intracranial abnormality. Chronic microvascular ischemic changes. No large vessel occlusion or hemodynamically significant stenosis. No evidence of dissection. Electronically Signed   By: Macy Mis M.D.   On: 10/20/2020 14:25   MR Brain Wo Contrast (neuro protocol)  Result Date: 10/20/2020 CLINICAL DATA:  Initial evaluation for acute neuro deficit, stroke, ataxia. EXAM: MRI HEAD WITHOUT CONTRAST TECHNIQUE: Multiplanar, multiecho pulse sequences of the brain and surrounding structures were obtained without intravenous contrast. COMPARISON:  Prior CT from earlier the same day. FINDINGS: Brain: Diffuse prominence of the CSF containing spaces compatible with moderately advanced cerebral atrophy. Patchy and confluent T2/FLAIR  hyperintensity within the periventricular deep white matter both cerebral hemispheres most consistent with chronic small vessel ischemic disease, moderate in nature. Superimposed remote lacunar infarct at the right thalamus. No abnormal foci of restricted diffusion to suggest acute or subacute ischemia. Gray-white matter differentiation maintained. No encephalomalacia to suggest chronic cortical infarction. No evidence for acute or chronic  intracranial hemorrhage. No mass lesion, midline shift or mass effect. No hydrocephalus or extra-axial fluid collection. Pituitary gland suprasellar region normal. Midline structures intact. Vascular: Major intracranial vascular flow voids are maintained. Skull and upper cervical spine: Craniocervical junction within normal limits. Degenerative thickening noted about the tectorial membrane. Diffusely decreased T1 signal intensity seen throughout the visualized bone marrow, nonspecific, but most commonly related to anemia, smoking, or obesity. No visible focal marrow replacing lesion. No scalp soft tissue abnormality. Sinuses/Orbits: Patient status post bilateral ocular lens replacement. Globes and orbital soft tissues demonstrate no acute finding. Scattered mucosal thickening noted throughout the paranasal sinuses. No mastoid effusion. Other: None. IMPRESSION: 1. No acute intracranial abnormality. 2. Moderately advanced cerebral atrophy with chronic small vessel ischemic disease. Small remote lacunar infarct at the right thalamus. Electronically Signed   By: Jeannine Boga M.D.   On: 10/20/2020 19:07     Subjective:   Patient was seen and examined 10/22/2020, 8:00 AM Patient stable today. No acute distress.  No issues overnight Stable for discharge.  Discharge Exam:    Vitals:   10/21/20 1129 10/21/20 1402 10/21/20 2050 10/22/20 0627  BP:  (!) 161/65 (!) 178/77 135/80  Pulse: 67 67 87 89  Resp:  18 18 19   Temp:  97.8 F (36.6 C)  98.7 F (37.1 C)  TempSrc:    Oral  SpO2: 96% 97% 96% 95%  Weight:      Height:        General: Pt lying comfortably in bed & appears in no obvious distress. Cardiovascular: S1 & S2 heard, RRR, S1/S2 +. No murmurs, rubs, gallops or clicks. No JVD or pedal edema. Respiratory: Clear to auscultation without wheezing, rhonchi or crackles. No increased work of breathing. Abdominal:  Non-distended, non-tender & soft. No organomegaly or masses appreciated. Normal  bowel sounds heard. CNS: Alert and oriented. No focal deficits. Extremities: no edema, no cyanosis      The results of significant diagnostics from this hospitalization (including imaging, microbiology, ancillary and laboratory) are listed below for reference.      Microbiology:   Recent Results (from the past 240 hour(s))  Resp Panel by RT-PCR (Flu A&B, Covid)     Status: None   Collection Time: 10/20/20 12:17 PM   Specimen: Nasopharyngeal(NP) swabs in vial transport medium  Result Value Ref Range Status   SARS Coronavirus 2 by RT PCR NEGATIVE NEGATIVE Final    Comment: (NOTE) SARS-CoV-2 target nucleic acids are NOT DETECTED.  The SARS-CoV-2 RNA is generally detectable in upper respiratory specimens during the acute phase of infection. The lowest concentration of SARS-CoV-2 viral copies this assay can detect is 138 copies/mL. A negative result does not preclude SARS-Cov-2 infection and should not be used as the sole basis for treatment or other patient management decisions. A negative result may occur with  improper specimen collection/handling, submission of specimen other than nasopharyngeal swab, presence of viral mutation(s) within the areas targeted by this assay, and inadequate number of viral copies(<138 copies/mL). A negative result must be combined with clinical observations, patient history, and epidemiological information. The expected result is Negative.  Fact Sheet for Patients:  EntrepreneurPulse.com.au  Fact Sheet for Healthcare Providers:  IncredibleEmployment.be  This test is no t yet approved or cleared by the [place] FDA and  has been authorized for detection and/or diagnosis of SARS-CoV-2 by FDA under an Emergency Use Authorization (EUA). This EUA will remain  in effect (meaning this test can be used) for the duration of the COVID-19 declaration under Section 564(b)(1) of the Act, 21 U.S.C.section  360bbb-3(b)(1), unless the authorization is terminated  or revoked sooner.       Influenza A by PCR NEGATIVE NEGATIVE Final   Influenza B by PCR NEGATIVE NEGATIVE Final    Comment: (NOTE) The Xpert Xpress SARS-CoV-2/FLU/RSV plus assay is intended as an aid in the diagnosis of influenza from Nasopharyngeal swab specimens and should not be used as a sole basis for treatment. Nasal washings and aspirates are unacceptable for Xpert Xpress SARS-CoV-2/FLU/RSV testing.  Fact Sheet for Patients: EntrepreneurPulse.com.au  Fact Sheet for Healthcare Providers: IncredibleEmployment.be  This test is not yet approved or cleared by the [place] FDA and has been authorized for detection and/or diagnosis of SARS-CoV-2 by FDA under an Emergency Use Authorization (EUA). This EUA will remain in effect (meaning this test can be used) for the duration of the COVID-19 declaration under Section 564(b)(1) of the Act, 21 U.S.C. section 360bbb-3(b)(1), unless the authorization is terminated or revoked.  Performed at [facility], [address] [place], [place] [address]      Labs:   CBC: Recent Labs  Lab 10/20/20 1205 10/20/20 1218  WBC 6.7  --   NEUTROABS 3.8  --   HGB 10.9* 11.9*  HCT 34.1* 35.0*  MCV 91.4  --   PLT 216  --    Basic Metabolic Panel: Recent Labs  Lab 10/20/20 1205 10/20/20 1218 10/22/20 0454  NA 136 138 138  K 4.4 4.4 4.1  CL 103 103 107  CO2 26  --  22  GLUCOSE 99 93 120*  BUN 24* 25* 18  CREATININE 0.86 0.90 0.87  CALCIUM 8.7*  --  9.1   Liver Function Tests: Recent Labs  Lab 10/20/20 1205  AST 19  ALT 14  ALKPHOS 48  BILITOT 0.8  PROT 7.2  ALBUMIN 3.7   BNP (last 3 results) No results for input(s): BNP in the last 8760 hours. Cardiac Enzymes: No results for input(s): CKTOTAL, CKMB, CKMBINDEX, TROPONINI in the last 168 hours. CBG: No results for input(s): GLUCAP in the last 168 hours. Hgb A1c Recent Labs     10/20/20 1205  HGBA1C 5.9*   Lipid Profile Recent Labs    10/21/20 0411  CHOL 169  HDL 40*  LDLCALC 96  TRIG 164*  CHOLHDL 4.2   Thyroid function studies Recent Labs    10/20/20 1205  TSH 2.702   Anemia work up Recent Labs    10/20/20 1205  VITAMINB12 359   Urinalysis    Component Value Date/Time   COLORURINE YELLOW 10/20/2020 [place] 10/20/2020 1218   LABSPEC 1.010 10/20/2020 1218   PHURINE 5.5 10/20/2020 [place] 10/20/2020 [place] (A) 10/20/2020 [place] 10/20/2020 [place] 10/20/2020 [place] 10/20/2020 1218   UROBILINOGEN 1.0 11/27/2013 2214   NITRITE NEGATIVE 10/20/2020 [place] (A) 10/20/2020 1218         Time coordinating discharge: Over 45 minutes  SIGNED: Deatra James, MD, FACP, FHM. [facility],  Please use  [email or web address] to Page If 7PM-7AM, please contact night-coverage [email or web address], Password [facility] 10/22/2020, 8:00 AM

## 2020-10-22 NOTE — Plan of Care (Signed)
  Problem: Acute Rehab OT Goals (only OT should resolve) Goal: Pt. Will Perform Upper Body Dressing Flowsheets (Taken 10/22/2020 1026) Pt Will Perform Upper Body Dressing:  with modified independence  sitting Goal: Pt. Will Perform Lower Body Dressing Flowsheets (Taken 10/22/2020 1026) Pt Will Perform Lower Body Dressing:  with mod assist  sitting/lateral leans Goal: Pt. Will Transfer To Toilet Flowsheets (Taken 10/22/2020 1026) Pt Will Transfer to Toilet:  with min assist  stand pivot transfer  Gisselle Galvis OT, MOT

## 2020-12-19 DIAGNOSIS — M6281 Muscle weakness (generalized): Secondary | ICD-10-CM | POA: Diagnosis not present

## 2020-12-19 DIAGNOSIS — I2510 Atherosclerotic heart disease of native coronary artery without angina pectoris: Secondary | ICD-10-CM | POA: Diagnosis not present

## 2020-12-19 DIAGNOSIS — I10 Essential (primary) hypertension: Secondary | ICD-10-CM | POA: Diagnosis not present

## 2020-12-28 DIAGNOSIS — M6281 Muscle weakness (generalized): Secondary | ICD-10-CM | POA: Diagnosis not present

## 2020-12-28 DIAGNOSIS — N390 Urinary tract infection, site not specified: Secondary | ICD-10-CM | POA: Diagnosis not present

## 2021-01-26 DIAGNOSIS — J189 Pneumonia, unspecified organism: Secondary | ICD-10-CM | POA: Diagnosis not present

## 2021-02-17 DIAGNOSIS — I2510 Atherosclerotic heart disease of native coronary artery without angina pectoris: Secondary | ICD-10-CM | POA: Diagnosis not present

## 2021-02-17 DIAGNOSIS — I10 Essential (primary) hypertension: Secondary | ICD-10-CM | POA: Diagnosis not present

## 2021-03-18 ENCOUNTER — Emergency Department (HOSPITAL_COMMUNITY): Payer: PPO

## 2021-03-18 ENCOUNTER — Other Ambulatory Visit: Payer: Self-pay

## 2021-03-18 ENCOUNTER — Inpatient Hospital Stay (HOSPITAL_COMMUNITY)
Admission: EM | Admit: 2021-03-18 | Discharge: 2021-03-22 | DRG: 177 | Disposition: A | Discharge status: Home Health | Payer: PPO | Attending: Internal Medicine | Admitting: Internal Medicine

## 2021-03-18 ENCOUNTER — Encounter (HOSPITAL_COMMUNITY): Payer: Self-pay | Admitting: Internal Medicine

## 2021-03-18 DIAGNOSIS — Z28310 Unvaccinated for covid-19: Secondary | ICD-10-CM | POA: Diagnosis not present

## 2021-03-18 DIAGNOSIS — R279 Unspecified lack of coordination: Secondary | ICD-10-CM | POA: Diagnosis not present

## 2021-03-18 DIAGNOSIS — I10 Essential (primary) hypertension: Secondary | ICD-10-CM | POA: Diagnosis not present

## 2021-03-18 DIAGNOSIS — Z96653 Presence of artificial knee joint, bilateral: Secondary | ICD-10-CM | POA: Diagnosis not present

## 2021-03-18 DIAGNOSIS — Z87442 Personal history of urinary calculi: Secondary | ICD-10-CM

## 2021-03-18 DIAGNOSIS — Z743 Need for continuous supervision: Secondary | ICD-10-CM | POA: Diagnosis not present

## 2021-03-18 DIAGNOSIS — I2510 Atherosclerotic heart disease of native coronary artery without angina pectoris: Secondary | ICD-10-CM | POA: Diagnosis not present

## 2021-03-18 DIAGNOSIS — U071 COVID-19: Secondary | ICD-10-CM | POA: Diagnosis not present

## 2021-03-18 DIAGNOSIS — Z8673 Personal history of transient ischemic attack (TIA), and cerebral infarction without residual deficits: Secondary | ICD-10-CM

## 2021-03-18 DIAGNOSIS — R6889 Other general symptoms and signs: Secondary | ICD-10-CM | POA: Diagnosis not present

## 2021-03-18 DIAGNOSIS — I4891 Unspecified atrial fibrillation: Secondary | ICD-10-CM | POA: Diagnosis present

## 2021-03-18 DIAGNOSIS — Z85828 Personal history of other malignant neoplasm of skin: Secondary | ICD-10-CM

## 2021-03-18 DIAGNOSIS — J9601 Acute respiratory failure with hypoxia: Secondary | ICD-10-CM | POA: Diagnosis not present

## 2021-03-18 DIAGNOSIS — Z66 Do not resuscitate: Secondary | ICD-10-CM | POA: Diagnosis not present

## 2021-03-18 DIAGNOSIS — F32A Depression, unspecified: Secondary | ICD-10-CM | POA: Diagnosis not present

## 2021-03-18 DIAGNOSIS — R0689 Other abnormalities of breathing: Secondary | ICD-10-CM | POA: Diagnosis not present

## 2021-03-18 DIAGNOSIS — T502X5A Adverse effect of carbonic-anhydrase inhibitors, benzothiadiazides and other diuretics, initial encounter: Secondary | ICD-10-CM | POA: Diagnosis present

## 2021-03-18 DIAGNOSIS — J9811 Atelectasis: Secondary | ICD-10-CM | POA: Diagnosis not present

## 2021-03-18 DIAGNOSIS — Y92239 Unspecified place in hospital as the place of occurrence of the external cause: Secondary | ICD-10-CM | POA: Diagnosis present

## 2021-03-18 DIAGNOSIS — I110 Hypertensive heart disease with heart failure: Secondary | ICD-10-CM | POA: Diagnosis present

## 2021-03-18 DIAGNOSIS — J9600 Acute respiratory failure, unspecified whether with hypoxia or hypercapnia: Secondary | ICD-10-CM | POA: Diagnosis not present

## 2021-03-18 DIAGNOSIS — R531 Weakness: Secondary | ICD-10-CM

## 2021-03-18 DIAGNOSIS — E876 Hypokalemia: Secondary | ICD-10-CM | POA: Diagnosis not present

## 2021-03-18 DIAGNOSIS — Z955 Presence of coronary angioplasty implant and graft: Secondary | ICD-10-CM

## 2021-03-18 DIAGNOSIS — J439 Emphysema, unspecified: Secondary | ICD-10-CM | POA: Diagnosis present

## 2021-03-18 DIAGNOSIS — Z87891 Personal history of nicotine dependence: Secondary | ICD-10-CM

## 2021-03-18 DIAGNOSIS — G4733 Obstructive sleep apnea (adult) (pediatric): Secondary | ICD-10-CM | POA: Diagnosis present

## 2021-03-18 DIAGNOSIS — I452 Bifascicular block: Secondary | ICD-10-CM | POA: Diagnosis present

## 2021-03-18 DIAGNOSIS — Z8551 Personal history of malignant neoplasm of bladder: Secondary | ICD-10-CM

## 2021-03-18 DIAGNOSIS — R0602 Shortness of breath: Secondary | ICD-10-CM | POA: Diagnosis not present

## 2021-03-18 DIAGNOSIS — I5032 Chronic diastolic (congestive) heart failure: Secondary | ICD-10-CM | POA: Diagnosis not present

## 2021-03-18 DIAGNOSIS — Z7401 Bed confinement status: Secondary | ICD-10-CM | POA: Diagnosis not present

## 2021-03-18 DIAGNOSIS — J1282 Pneumonia due to coronavirus disease 2019: Secondary | ICD-10-CM | POA: Diagnosis not present

## 2021-03-18 DIAGNOSIS — J189 Pneumonia, unspecified organism: Secondary | ICD-10-CM | POA: Diagnosis not present

## 2021-03-18 LAB — COMPREHENSIVE METABOLIC PANEL
ALT: 20 U/L (ref 0–44)
AST: 31 U/L (ref 15–41)
Albumin: 3.6 g/dL (ref 3.5–5.0)
Alkaline Phosphatase: 46 U/L (ref 38–126)
Anion gap: 8 (ref 5–15)
BUN: 19 mg/dL (ref 8–23)
CO2: 27 mmol/L (ref 22–32)
Calcium: 8.9 mg/dL (ref 8.9–10.3)
Chloride: 104 mmol/L (ref 98–111)
Creatinine, Ser: 0.76 mg/dL (ref 0.61–1.24)
GFR, Estimated: >60 mL/min (ref >60)
Glucose, Bld: 127 mg/dL — ABNORMAL HIGH (ref 70–99)
Potassium: 3.4 mmol/L — ABNORMAL LOW (ref 3.5–5.1)
Sodium: 139 mmol/L (ref 135–145)
Total Bilirubin: 1 mg/dL (ref 0.3–1.2)
Total Protein: 7.5 g/dL (ref 6.5–8.1)

## 2021-03-18 LAB — TRIGLYCERIDES: Triglycerides: 103 mg/dL (ref <150)

## 2021-03-18 LAB — CBC WITH DIFFERENTIAL/PLATELET
Abs Immature Granulocytes: 0.05 10*3/uL (ref 0.00–0.07)
Basophils Absolute: 0 10*3/uL (ref 0.0–0.1)
Basophils Relative: 0 %
Eosinophils Absolute: 0 10*3/uL (ref 0.0–0.5)
Eosinophils Relative: 0 %
HCT: 44.4 % (ref 39.0–52.0)
Hemoglobin: 14.7 g/dL (ref 13.0–17.0)
Immature Granulocytes: 1 %
Lymphocytes Relative: 12 %
Lymphs Abs: 1.2 10*3/uL (ref 0.7–4.0)
MCH: 31 pg (ref 26.0–34.0)
MCHC: 33.1 g/dL (ref 30.0–36.0)
MCV: 93.7 fL (ref 80.0–100.0)
Monocytes Absolute: 0.7 10*3/uL (ref 0.1–1.0)
Monocytes Relative: 7 %
Neutro Abs: 8 10*3/uL — ABNORMAL HIGH (ref 1.7–7.7)
Neutrophils Relative %: 80 %
Platelets: 128 10*3/uL — ABNORMAL LOW (ref 150–400)
RBC: 4.74 MIL/uL (ref 4.22–5.81)
RDW: 13.9 % (ref 11.5–15.5)
WBC: 10 10*3/uL (ref 4.0–10.5)
nRBC: 0 % (ref 0.0–0.2)

## 2021-03-18 LAB — LACTIC ACID, PLASMA: Lactic Acid, Venous: 1 mmol/L (ref 0.5–1.9)

## 2021-03-18 LAB — FIBRINOGEN: Fibrinogen: 669 mg/dL — ABNORMAL HIGH (ref 210–475)

## 2021-03-18 LAB — RESP PANEL BY RT-PCR (FLU A&B, COVID) ARPGX2
Influenza A by PCR: NEGATIVE
Influenza B by PCR: NEGATIVE
SARS Coronavirus 2 by RT PCR: POSITIVE — AB

## 2021-03-18 LAB — LACTATE DEHYDROGENASE: LDH: 179 U/L (ref 98–192)

## 2021-03-18 LAB — PROCALCITONIN: Procalcitonin: 0.14 ng/mL

## 2021-03-18 LAB — FERRITIN: Ferritin: 106 ng/mL (ref 24–336)

## 2021-03-18 LAB — C-REACTIVE PROTEIN: CRP: 17.8 mg/dL — ABNORMAL HIGH (ref <1.0)

## 2021-03-18 LAB — D-DIMER, QUANTITATIVE: D-Dimer, Quant: 1 ug/mL-FEU — ABNORMAL HIGH (ref 0.00–0.50)

## 2021-03-18 MED ORDER — ACETAMINOPHEN 325 MG PO TABS
650.0000 mg | ORAL_TABLET | Freq: Four times a day (QID) | ORAL | Status: DC | PRN
  Administered 2021-03-19 – 2021-03-20 (×2): 650 mg via ORAL
  Filled 2021-03-18 (×2): qty 2

## 2021-03-18 MED ORDER — SODIUM CHLORIDE 0.9 % IV SOLN: 250.0000 mL | INTRAVENOUS | Status: DC | PRN

## 2021-03-18 MED ORDER — ASPIRIN EC 81 MG PO TBEC
81.0000 mg | DELAYED_RELEASE_TABLET | Freq: Every day | ORAL | Status: DC
  Administered 2021-03-18 – 2021-03-22 (×5): 81 mg via ORAL
  Filled 2021-03-18 (×5): qty 1

## 2021-03-18 MED ORDER — VITAMIN B-12 1000 MCG PO TABS
1000.0000 ug | ORAL_TABLET | Freq: Every day | ORAL | Status: DC
  Administered 2021-03-19 – 2021-03-22 (×4): 1000 ug via ORAL
  Filled 2021-03-18 (×4): qty 1

## 2021-03-18 MED ORDER — POTASSIUM CHLORIDE CRYS ER 10 MEQ PO TBCR
10.0000 meq | EXTENDED_RELEASE_TABLET | Freq: Every day | ORAL | Status: DC
  Administered 2021-03-18 – 2021-03-22 (×5): 10 meq via ORAL
  Filled 2021-03-18 (×5): qty 1

## 2021-03-18 MED ORDER — ALBUTEROL SULFATE HFA 108 (90 BASE) MCG/ACT IN AERS
2.0000 | INHALATION_SPRAY | Freq: Four times a day (QID) | RESPIRATORY_TRACT | Status: DC
  Administered 2021-03-18 – 2021-03-21 (×11): 2 via RESPIRATORY_TRACT
  Filled 2021-03-18: qty 6.7

## 2021-03-18 MED ORDER — SODIUM CHLORIDE 0.9 % IV SOLN: 200.0000 mg | Freq: Once | INTRAVENOUS | Status: DC

## 2021-03-18 MED ORDER — IOHEXOL 350 MG/ML SOLN
100.0000 mL | Freq: Once | INTRAVENOUS | Status: AC | PRN
  Administered 2021-03-18: 100 mL via INTRAVENOUS

## 2021-03-18 MED ORDER — SODIUM CHLORIDE 0.9 % IV SOLN
100.0000 mg | Freq: Every day | INTRAVENOUS | Status: DC
  Administered 2021-03-19 – 2021-03-21 (×3): 100 mg via INTRAVENOUS
  Filled 2021-03-18 (×4): qty 20

## 2021-03-18 MED ORDER — NITROGLYCERIN 0.4 MG SL SUBL: 0.4000 mg | SUBLINGUAL_TABLET | SUBLINGUAL | Status: DC | PRN

## 2021-03-18 MED ORDER — FUROSEMIDE 20 MG PO TABS
20.0000 mg | ORAL_TABLET | Freq: Every day | ORAL | Status: DC
  Administered 2021-03-19 – 2021-03-22 (×4): 20 mg via ORAL
  Filled 2021-03-18 (×4): qty 1

## 2021-03-18 MED ORDER — ZINC SULFATE 220 (50 ZN) MG PO CAPS
220.0000 mg | ORAL_CAPSULE | Freq: Every day | ORAL | Status: DC
  Administered 2021-03-19 – 2021-03-22 (×4): 220 mg via ORAL
  Filled 2021-03-18 (×4): qty 1

## 2021-03-18 MED ORDER — ASCORBIC ACID 500 MG PO TABS
500.0000 mg | ORAL_TABLET | Freq: Every day | ORAL | Status: DC
  Administered 2021-03-19 – 2021-03-22 (×4): 500 mg via ORAL
  Filled 2021-03-18 (×4): qty 1

## 2021-03-18 MED ORDER — SERTRALINE HCL 50 MG PO TABS
50.0000 mg | ORAL_TABLET | Freq: Every day | ORAL | Status: DC
  Administered 2021-03-19 – 2021-03-22 (×4): 50 mg via ORAL
  Filled 2021-03-18 (×4): qty 1

## 2021-03-18 MED ORDER — ADULT MULTIVITAMIN W/MINERALS CH
1.0000 | ORAL_TABLET | Freq: Every morning | ORAL | Status: DC
  Administered 2021-03-19 – 2021-03-21 (×3): 1 via ORAL
  Filled 2021-03-18 (×3): qty 1

## 2021-03-18 MED ORDER — FINASTERIDE 5 MG PO TABS
5.0000 mg | ORAL_TABLET | Freq: Every day | ORAL | Status: DC
  Administered 2021-03-19 – 2021-03-22 (×4): 5 mg via ORAL
  Filled 2021-03-18 (×4): qty 1

## 2021-03-18 MED ORDER — OMEGA-3-ACID ETHYL ESTERS 1 G PO CAPS
1.0000 g | ORAL_CAPSULE | Freq: Every day | ORAL | Status: DC
  Administered 2021-03-19 – 2021-03-22 (×4): 1 g via ORAL
  Filled 2021-03-18 (×4): qty 1

## 2021-03-18 MED ORDER — CARVEDILOL 3.125 MG PO TABS
3.1250 mg | ORAL_TABLET | Freq: Two times a day (BID) | ORAL | Status: DC
  Administered 2021-03-18 – 2021-03-22 (×8): 3.125 mg via ORAL
  Filled 2021-03-18 (×8): qty 1

## 2021-03-18 MED ORDER — OSTEO BI-FLEX ADV JOINT SHIELD PO TABS: 1.0000 | ORAL_TABLET | Freq: Every day | ORAL | Status: DC

## 2021-03-18 MED ORDER — ASPIRIN 81 MG PO TABS: 81.0000 mg | ORAL_TABLET | Freq: Every day | ORAL | Status: DC

## 2021-03-18 MED ORDER — LOSARTAN POTASSIUM 25 MG PO TABS: 100.0000 mg | ORAL_TABLET | Freq: Every day | ORAL | Status: DC

## 2021-03-18 MED ORDER — ONDANSETRON HCL 4 MG PO TABS: 4.0000 mg | ORAL_TABLET | Freq: Four times a day (QID) | ORAL | Status: DC | PRN

## 2021-03-18 MED ORDER — SODIUM CHLORIDE 0.9% FLUSH
3.0000 mL | Freq: Two times a day (BID) | INTRAVENOUS | Status: DC
  Administered 2021-03-18 – 2021-03-21 (×7): 3 mL via INTRAVENOUS

## 2021-03-18 MED ORDER — SODIUM CHLORIDE 0.9 % IV SOLN: 100.0000 mg | Freq: Every day | INTRAVENOUS | Status: DC

## 2021-03-18 MED ORDER — DEXAMETHASONE SODIUM PHOSPHATE 10 MG/ML IJ SOLN
6.0000 mg | INTRAMUSCULAR | Status: DC
  Administered 2021-03-18 – 2021-03-21 (×4): 6 mg via INTRAVENOUS
  Filled 2021-03-18 (×4): qty 1

## 2021-03-18 MED ORDER — SODIUM CHLORIDE 0.9 % IV SOLN
100.0000 mg | INTRAVENOUS | Status: AC
  Administered 2021-03-18 (×2): 100 mg via INTRAVENOUS
  Filled 2021-03-18 (×2): qty 20

## 2021-03-18 MED ORDER — ONDANSETRON HCL 4 MG/2ML IJ SOLN: 4.0000 mg | Freq: Four times a day (QID) | INTRAMUSCULAR | Status: DC | PRN

## 2021-03-18 MED ORDER — LOSARTAN POTASSIUM 50 MG PO TABS
100.0000 mg | ORAL_TABLET | Freq: Every day | ORAL | Status: DC
  Administered 2021-03-19 – 2021-03-22 (×4): 100 mg via ORAL
  Filled 2021-03-18 (×3): qty 2
  Filled 2021-03-18: qty 4

## 2021-03-18 MED ORDER — FERROUS SULFATE 325 (65 FE) MG PO TABS
325.0000 mg | ORAL_TABLET | Freq: Every day | ORAL | Status: DC
  Administered 2021-03-19 – 2021-03-22 (×4): 325 mg via ORAL
  Filled 2021-03-18 (×4): qty 1

## 2021-03-18 MED ORDER — GUAIFENESIN-DM 100-10 MG/5ML PO SYRP
10.0000 mL | ORAL_SOLUTION | ORAL | Status: DC | PRN
  Administered 2021-03-20 – 2021-03-21 (×3): 10 mL via ORAL
  Filled 2021-03-18 (×3): qty 10

## 2021-03-18 MED ORDER — SODIUM CHLORIDE 0.9% FLUSH: 3.0000 mL | INTRAVENOUS | Status: DC | PRN

## 2021-03-18 MED ORDER — ENOXAPARIN SODIUM 40 MG/0.4ML IJ SOSY
40.0000 mg | PREFILLED_SYRINGE | INTRAMUSCULAR | Status: DC
  Administered 2021-03-18 – 2021-03-21 (×4): 40 mg via SUBCUTANEOUS
  Filled 2021-03-18 (×4): qty 0.4

## 2021-03-18 NOTE — ED Provider Notes (Signed)
[facility] EMERGENCY DEPARTMENT Provider Note   CSN: [id number] Arrival date & time: 03/18/21  [id number]     History Chief Complaint  Patient presents with   Shortness of Breath   Weakness    GORGE KEHR is a 87 y.o. male.  Patient has had shortness of breath and weakness for couple days.  He can even get out of bed and walk around..  Symptoms started couple days ago with cough  The history is provided by the patient. No language interpreter was used.  Shortness of Breath Severity:  Moderate Onset quality:  Sudden Duration:  3 days Timing:  Constant Progression:  Waxing and waning Chronicity:  New Context: activity   Relieved by:  Nothing Worsened by:  Nothing Ineffective treatments:  None tried Associated symptoms: cough   Associated symptoms: no abdominal pain, no chest pain, no headaches and no rash   Weakness Associated symptoms: cough and shortness of breath   Associated symptoms: no abdominal pain, no chest pain, no diarrhea, no frequency, no headaches and no seizures       Past Medical History:  Diagnosis Date   Arthritis    OSTEO   Atrial fibrillation and flutter (HCC)    Bladder cancer ([place])    Coronary artery disease    Dysrhythmia    Exertional angina ([place]) 07/08/2014   H/O urinary frequency    Hematuria    Hypertension    Iron deficiency anemia    Kidney stones    hx of    Mild obstructive sleep apnea    no cpap   S/P coronary artery stent placement 07/24/2014   Skin cancer    Weakness     Patient Active Problem List   Diagnosis Date Noted   Truncal ataxia 10/20/2020   Dizzy 10/20/2020   TIA (transient ischemic attack) 10/20/2020   Acute respiratory failure with hypoxia ([place])    Influenza with pneumonia    Palliative care encounter    Sepsis ([place]) 09/25/2017   Fever 09/25/2017   UTI (urinary tract infection) 09/25/2017   Abnormal liver function 09/25/2017   Tachycardia 09/25/2017   Influenza A    Pressure ulcer 09/14/2015    Zenker's diverticulum 09/09/2015   Bladder cancer ([place]) 07/14/2015   CAD (coronary artery disease) 07/24/2014   S/P coronary artery stent placement (RCA bare metal stent on 07/08/14) 07/24/2014   Exertional angina ([place]) 07/08/2014   Encounter for therapeutic drug monitoring 09/12/2013   Atrial fibrillation ([place]) 09/08/2013   HTN (hypertension) 09/08/2013   Fatigue 09/08/2013   Anemia 06/26/2012    Past Surgical History:  Procedure Laterality Date   CARDIAC CATHETERIZATION     CARDIOVASCULAR STRESS TEST  12-15-2013  DR Kate Sable ([place])   MILD - MODERATE PERI-INFARCT ISCHEMIA  OF INFERIOR WALL, MID-INFEROSEPTAL WALL, MID-INFEROLATERAL WALL, & BASAL INFERIOR WALL/   NORMAL LVF /  EF 59%/  INTERMEDIATE RISK STUDY   COLONOSCOPY  07/04/2012   Procedure: COLONOSCOPY;  Surgeon: Rogene Houston, MD;  [facility];  Service: Endoscopy;  Laterality: N/A;  320   CORONARY STENT PLACEMENT  07/08/2014   PTCA of RCA   DR COOPER   CYSTOSCOPY W/ RETROGRADES Bilateral 12/31/2013   Procedure: CYSTOSCOPY WITH RETROGRADE PYELOGRAM;  Surgeon: Alexis Frock, MD;  Location: [facility] ORS;  Service: Urology;  Laterality: Bilateral;   CYSTOSCOPY W/ RETROGRADES Bilateral 01/20/2015   Procedure: CYSTOSCOPY WITH RETROGRADE PYELOGRAM;  Surgeon: Alexis Frock, MD;  Location: [facility] ORS;  Service: Urology;  Laterality: Bilateral;   ESOPHAGOGASTRODUODENOSCOPY  07/04/2012   Procedure: ESOPHAGOGASTRODUODENOSCOPY (EGD);  Surgeon: Rogene Houston, MD;  Location[facility];  Service: Endoscopy;  Laterality: N/A;   EYE SURGERY     past cataract surgery bilat   HERNIA REPAIR Right    LEFT HEART CATHETERIZATION WITH CORONARY ANGIOGRAM N/A 07/08/2014   Procedure: LEFT HEART CATHETERIZATION WITH CORONARY ANGIOGRAM;  Surgeon: Blane Ohara, MD;  Location[facility] CATH LAB;  Service: Cardiovascular;  Laterality: N/A;   PERCUTANEOUS CORONARY STENT INTERVENTION (PCI-S)  07/08/2014   Procedure: PERCUTANEOUS  CORONARY STENT INTERVENTION (PCI-S);  Surgeon: Blane Ohara, MD;  Location: [facility] CATH LAB;  Service: Cardiovascular;;   TOTAL KNEE ARTHROPLASTY Bilateral 2000  &  2003?   TRANSTHORACIC ECHOCARDIOGRAM  09-11-2013   MODERATE LVH/  EF 55-60%/  MILD MR & TR/  MILD TO MODERATE CALCIFIED AV WITHOUT STENOSIS/  MILD LAE/   MODERATE PR   TRANSURETHRAL RESECTION OF BLADDER TUMOR N/A 01/20/2015   Procedure: TRANSURETHRAL RESECTION OF BLADDER TUMOR (TURBT);  Surgeon: Alexis Frock, MD;  Location: [facility];  Service: Urology;  Laterality: N/A;   TRANSURETHRAL RESECTION OF BLADDER TUMOR WITH GYRUS (TURBT-GYRUS) N/A 12/31/2013   Procedure: TRANSURETHRAL RESECTION OF BLADDER TUMOR WITH GYRUS (TURBT-GYRUS);  Surgeon: Alexis Frock, MD;  Location: [facility];  Service: Urology;  Laterality: N/A;   ZENKER'S DIVERTICULECTOMY N/A 09/09/2015   Procedure: ENDOSCOPIC ZENKER'S DIVERTICULECTOMY;  Surgeon: Izora Gala, MD;  Location: [place];  Service: ENT;  Laterality: N/A;   ZENKER'S DIVERTICULECTOMY ENDOSCOPIC  09/09/2015       No family history on file.  Social History   Tobacco Use   Smoking status: Former    Packs/day: 2.00    Years: 20.00    Pack years: 40.00    Types: Cigarettes, Cigars, Pipe    Quit date: 08/21/1968    Years since quitting: 52.6   Smokeless tobacco: Never   Tobacco comments:    quit smoking in 1980  Substance Use Topics   Alcohol use: No    Alcohol/week: 0.0 standard drinks   Drug use: No    Home Medications Prior to Admission medications   Medication Sig Start Date End Date Taking? Authorizing Provider  ascorbic acid (VITAMIN C) 500 MG tablet Take 500 mg by mouth daily.    [provider]  aspirin 81 MG tablet Take 1 tablet (81 mg total) by mouth daily. 09/09/18   Herminio Commons, MD  carvedilol (COREG) 3.125 MG tablet Take 3.125 mg by mouth 2 (two) times daily. 05/17/20   [provider]  ferrous sulfate 325 (65 FE) MG EC tablet Take 325 mg by mouth daily with  breakfast.    [provider]  finasteride (PROSCAR) 5 MG tablet Take 5 mg by mouth daily.    [provider]  furosemide (LASIX) 20 MG tablet Take 20 mg by mouth daily. 09/29/20   [provider]  losartan (COZAAR) 100 MG tablet Take 100 mg by mouth daily.    [provider]  Misc Natural Products (OSTEO BI-FLEX ADV JOINT SHIELD) TABS Take 1 tablet by mouth daily.     [provider]  Multiple Vitamins-Minerals (CENTRUM SILVER ADULT 50+) TABS Take 1 tablet by mouth every morning.    [provider]  nitroGLYCERIN (NITROSTAT) 0.4 MG SL tablet Place 1 tablet (0.4 mg total) under the tongue every 5 (five) minutes as needed for chest pain. 07/03/14   Herminio Commons, MD  Omega-3 Fatty Acids (  FISH OIL) 1000 MG CAPS Take 1 capsule by mouth daily.    [provider]  potassium chloride (KLOR-CON) 10 MEQ tablet Take 10 mEq by mouth daily. 09/29/20   [provider]  sertraline (ZOLOFT) 50 MG tablet Take 50 mg by mouth daily. 06/30/19   [provider]  vitamin B-12 (CYANOCOBALAMIN) 1000 MCG tablet 1 tablet (1,000 mcg total) by Per NG tube route daily. Patient taking differently: Take 1,000 mcg by mouth daily. 09/15/15   Izora Gala, MD    Allergies    Patient has no known allergies.  Review of Systems   Review of Systems  Constitutional:  Negative for appetite change and fatigue.  HENT:  Negative for congestion, ear discharge and sinus pressure.   Eyes:  Negative for discharge.  Respiratory:  Positive for cough and shortness of breath.   Cardiovascular:  Negative for chest pain.  Gastrointestinal:  Negative for abdominal pain and diarrhea.  Genitourinary:  Negative for frequency and hematuria.  Musculoskeletal:  Negative for back pain.  Skin:  Negative for rash.  Neurological:  Positive for weakness. Negative for seizures and headaches.  Psychiatric/Behavioral:  Negative for hallucinations.    Physical  Exam Updated Vital Signs BP (!) 188/91   Pulse 78   Temp 98.9 F (37.2 C) (Oral)   Resp (!) 25   SpO2 91%   Physical Exam Vitals and nursing note reviewed.  Constitutional:      Appearance: He is well-developed.  HENT:     Head: Normocephalic.     Nose: Nose normal.  Eyes:     General: No scleral icterus.    Conjunctiva/sclera: Conjunctivae normal.  Neck:     Thyroid: No thyromegaly.  Cardiovascular:     Rate and Rhythm: Normal rate and regular rhythm.     Heart sounds: No murmur heard.   No friction rub. No gallop.  Pulmonary:     Breath sounds: No stridor. No wheezing or rales.  Chest:     Chest wall: No tenderness.  Abdominal:     General: There is no distension.     Tenderness: There is no abdominal tenderness. There is no rebound.  Musculoskeletal:        General: Normal range of motion.     Cervical back: Neck supple.  Lymphadenopathy:     Cervical: No cervical adenopathy.  Skin:    Findings: No erythema or rash.  Neurological:     Mental Status: He is alert and oriented to person, place, and time.     Motor: No abnormal muscle tone.     Coordination: Coordination normal.  Psychiatric:        Behavior: Behavior normal.    ED Results / Procedures / Treatments   Labs (all labs ordered are listed, but only abnormal results are displayed) Labs Reviewed  RESP PANEL BY RT-PCR (FLU A&B, COVID) ARPGX2 - Abnormal; Notable for the following components:      Result Value   SARS Coronavirus 2 by RT PCR POSITIVE (*)    All other components within normal limits  CBC WITH DIFFERENTIAL/PLATELET - Abnormal; Notable for the following components:   Platelets 128 (*)    Neutro Abs 8.0 (*)    All other components within normal limits  COMPREHENSIVE METABOLIC PANEL - Abnormal; Notable for the following components:   Potassium 3.4 (*)    Glucose, Bld 127 (*)    All other components within normal limits  CULTURE, BLOOD (ROUTINE X 2)  CULTURE, BLOOD (  ROUTINE X 2)  LACTIC  ACID, PLASMA  D-DIMER, QUANTITATIVE  PROCALCITONIN  LACTATE DEHYDROGENASE  FERRITIN  TRIGLYCERIDES  FIBRINOGEN  C-REACTIVE PROTEIN    EKG None  Radiology CT Angio Chest PE W and/or Wo Contrast  Result Date: 03/18/2021 CLINICAL DATA:  87 year old male with shortness of breath, COVID positive. EXAM: CT ANGIOGRAPHY CHEST WITH CONTRAST TECHNIQUE: Multidetector CT imaging of the chest was performed using the standard protocol during bolus administration of intravenous contrast. Multiplanar CT image reconstructions and MIPs were obtained to evaluate the vascular anatomy. CONTRAST:  100 mL Omnipaque 350, intravenous COMPARISON:  09/25/2017 FINDINGS: Cardiovascular: Satisfactory opacification of the pulmonary arteries to the segmental level. No evidence of pulmonary embolism. Normal heart size. No pericardial effusion. Coronary, aortic valvular, mitral annular, and aortic atherosclerotic calcifications are noted. Mediastinum/Nodes: No enlarged mediastinal, hilar, or axillary lymph nodes. Thyroid gland, trachea, and esophagus demonstrate no significant findings. Lungs/Pleura: Similar appearing bibasilar peribronchovascular thickening with associated dependent atelectasis and scattered consolidative opacities. No new consolidative opacities, significant pleural effusion, pneumothorax. Mild emphysematous changes are noted. Mild Upper Abdomen: The visualized upper abdomen is within normal limits. Musculoskeletal: No chest wall abnormality. No acute or significant osseous findings. Review of the MIP images confirms the above findings. IMPRESSION: Vascular: 1. No evidence of pulmonary embolism. 2. Coronary and aortic atherosclerosis (ICD10-I70.0). Non-Vascular: 1. Bibasilar bronchial wall thickening and associated atelectasis, similar to 2019 comparison, likely indicative of sequela of chronic aspiration. Superimposed pneumonia could appear similarly. 2.  Mild emphysema (ICD10-J43.9). Ruthann Cancer, MD [facility] Radiology Electronically Signed   By: Ruthann Cancer MD   On: 03/18/2021 13:44   DG Chest Port 1 View  Result Date: 03/18/2021 CLINICAL DATA:  87 year old male with shortness of breath for 1 week. Positive COVID-19. EXAM: PORTABLE CHEST 1 VIEW COMPARISON:  Chest radiographs 07/27/2019 and earlier. FINDINGS: Portable AP semi upright view at 1039 hours. Lower lung volumes. Indistinct increased lung base opacity greater on the left. Stable cardiac size and mediastinal contours. No pneumothorax or pulmonary edema. No definite pleural effusion. No other confluent pulmonary opacity. Visualized tracheal air column is within normal limits. Paucity of bowel gas in the upper abdomen. No acute osseous abnormality identified. IMPRESSION: Low lung volumes with nonspecific bibasilar opacity greater on the left, possibly atelectasis but left lung base pneumonia not excluded. PA and lateral views would be helpful when feasible. Electronically Signed   By: Genevie Ann M.D.   On: 03/18/2021 11:02    Procedures Procedures   Medications Ordered in ED Medications  iohexol (OMNIPAQUE) 350 MG/ML injection 100 mL (100 mLs Intravenous Contrast Given 03/18/21 1306)    ED Course  I have reviewed the triage vital signs and the nursing notes.  Pertinent labs & imaging results that were available during my care of the patient were reviewed by me and considered in my medical decision making (see chart for details). Patient with COVID.  Patient has mild hypoxia with his sats on room air 88-8 9%.  Patient is on 2 L now.  Patient also has extreme fatigue.   MDM Rules/Calculators/A&P                           Patient will be admitted to medicine for hypoxia from COVID and extreme fatigue Final Clinical Impression(s) / ED Diagnoses Final diagnoses:  None    Rx / DC Orders ED Discharge Orders     None  Milton Ferguson, MD 03/21/21 1101

## 2021-03-18 NOTE — ED Triage Notes (Signed)
Pt states that he has been short of breath and weak for a week or more pt is covid positive.

## 2021-03-18 NOTE — ED Notes (Signed)
Pt clothing and pull-up removed due to incontinence of urine and stool. Pt cleaned up and changed into gown. Condom cath placed on pt.

## 2021-03-18 NOTE — H&P (Signed)
History and Physical    Manuel Le [id number] DOB: [birth year] DOA: 03/18/2021  PCP: Celene Squibb, MD   Patient coming from: Home  I have personally briefly reviewed patient's old medical records in [place]  Chief Complaint: Weakness Most of the history was obtained from patient's healthcare power of attorney via phone as well as the EMR. Patient is only oriented to person unable to provide any history.  HPI: Manuel Le is a 87 y.o. male with medical history significant for coronary artery disease, hypertension, depression, chronic diastolic dysfunction CHF who was brought into the ER by EMS for evaluation of weakness and inability to ambulate for couple of days. At baseline patient ambulates with a rolling walker but over the last couple of days he has been very weak and unable to ambulate and was noted to be covered in feces and urine when he arrived to ER. He also complained of shortness of breath as well as a cough. Patient is unvaccinated. I am unable to do review of systems due to his underlying mental status. Labs show sodium 139, potassium 3.4, chloride 104, bicarb 27, glucose 127, BUN 19, creatinine 0.76, calcium 8.9, alkaline phosphatase 46, albumin 3.6, AST 31, ALT 20, total protein 7.5, lactic acid 1.0, procalcitonin 0.14, white count 10.0, hemoglobin 14.7, hematocrit 44.4, MCV 93.7, RDW 13.9, platelet count 128, D-dimer 1.0, fibrinogen 669 Chest x-ray reviewed by me shows low lung volumes with nonspecific bibasilar opacity greater on the left possibly atelectasis but left lung base pneumonia not excluded. CT angiogram of the chest shows bibasilar bronchial wall thickening and associated atelectasis similar to 2019 likely indicative of sequela of chronic aspiration.  Superimposed pneumonia could appear similarly.  Mild emphysema. Twelve-lead EKG reviewed by me shows sinus rhythm with PVCs, right bundle branch block and left anterior fascicular block.   ED  Course: Patient is a 87 year old unvaccinated male who presents to the ER via EMS for evaluation of weakness, inability to ambulate and shortness of breath for couple of days. At baseline he usually ambulates with a rolling walker. Patient is unvaccinated and his point-of-care COVID-19 test is positive. Room air pulse oximetry was 88% and he is currently on 2 L of oxygen with improvement in his pulse oximetry to 91-92%. He will be admitted to the hospital for further evaluation.  Review of Systems: As per HPI otherwise all other systems reviewed and negative.    Past Medical History:  Diagnosis Date   Arthritis    OSTEO   Atrial fibrillation and flutter ([place])    Bladder cancer ([place])    Coronary artery disease    Dysrhythmia    Exertional angina ([place]) 07/08/2014   H/O urinary frequency    Hematuria    Hypertension    Iron deficiency anemia    Kidney stones    hx of    Mild obstructive sleep apnea    no cpap   S/P coronary artery stent placement 07/24/2014   Skin cancer    Weakness     Past Surgical History:  Procedure Laterality Date   CARDIAC CATHETERIZATION     CARDIOVASCULAR STRESS TEST  12-15-2013  DR Kate Sable ([place])   MILD - MODERATE PERI-INFARCT ISCHEMIA  OF INFERIOR WALL, MID-INFEROSEPTAL WALL, MID-INFEROLATERAL WALL, & BASAL INFERIOR WALL/   NORMAL LVF /  EF 59%/  INTERMEDIATE RISK STUDY   COLONOSCOPY  07/04/2012   Procedure: COLONOSCOPY;  Surgeon: Rogene Houston, MD;  [facility];  Service: Endoscopy;  Laterality: N/A;  320   CORONARY STENT PLACEMENT  07/08/2014   PTCA of RCA   DR COOPER   CYSTOSCOPY W/ RETROGRADES Bilateral 12/31/2013   Procedure: CYSTOSCOPY WITH RETROGRADE PYELOGRAM;  Surgeon: Alexis Frock, MD;  Location: [facility];  Service: Urology;  Laterality: Bilateral;   CYSTOSCOPY W/ RETROGRADES Bilateral 01/20/2015   Procedure: CYSTOSCOPY WITH RETROGRADE PYELOGRAM;  Surgeon: Alexis Frock, MD;  [facility];  Service:  Urology;  Laterality: Bilateral;   ESOPHAGOGASTRODUODENOSCOPY  07/04/2012   Procedure: ESOPHAGOGASTRODUODENOSCOPY (EGD);  Surgeon: Rogene Houston, MD;  [facility];  Service: Endoscopy;  Laterality: N/A;   EYE SURGERY     past cataract surgery bilat   HERNIA REPAIR Right    LEFT HEART CATHETERIZATION WITH CORONARY ANGIOGRAM N/A 07/08/2014   Procedure: LEFT HEART CATHETERIZATION WITH CORONARY ANGIOGRAM;  Surgeon: Blane Ohara, MD;  Location: [facility] CATH LAB;  Service: Cardiovascular;  Laterality: N/A;   PERCUTANEOUS CORONARY STENT INTERVENTION (PCI-S)  07/08/2014   Procedure: PERCUTANEOUS CORONARY STENT INTERVENTION (PCI-S);  Surgeon: Blane Ohara, MD;  Location: [facility] CATH LAB;  Service: Cardiovascular;;   TOTAL KNEE ARTHROPLASTY Bilateral 2000  &  2003?   TRANSTHORACIC ECHOCARDIOGRAM  09-11-2013   MODERATE LVH/  EF 55-60%/  MILD MR & TR/  MILD TO MODERATE CALCIFIED AV WITHOUT STENOSIS/  MILD LAE/   MODERATE PR   TRANSURETHRAL RESECTION OF BLADDER TUMOR N/A 01/20/2015   Procedure: TRANSURETHRAL RESECTION OF BLADDER TUMOR (TURBT);  Surgeon: Alexis Frock, MD;  [facility];  Service: Urology;  Laterality: N/A;   TRANSURETHRAL RESECTION OF BLADDER TUMOR WITH GYRUS (TURBT-GYRUS) N/A 12/31/2013   Procedure: TRANSURETHRAL RESECTION OF BLADDER TUMOR WITH GYRUS (TURBT-GYRUS);  Surgeon: Alexis Frock, MD;  [facility];  Service: Urology;  Laterality: N/A;   ZENKER'S DIVERTICULECTOMY N/A 09/09/2015   Procedure: ENDOSCOPIC ZENKER'S DIVERTICULECTOMY;  Surgeon: Izora Gala, MD;  Location: [facility];  Service: ENT;  Laterality: N/A;   ZENKER'S DIVERTICULECTOMY ENDOSCOPIC  09/09/2015     reports that he quit smoking about 52 years ago. His smoking use included cigarettes, cigars, and pipe. He has a 40.00 pack-year smoking history. He has never used smokeless tobacco. He reports that he does not drink alcohol and does not use drugs.  No Known Allergies  History reviewed. No pertinent  family history.  Patient is unable to provide any history  Prior to Admission medications   Medication Sig Start Date End Date Taking? Authorizing Provider  ascorbic acid (VITAMIN C) 500 MG tablet Take 500 mg by mouth daily.    [provider]  aspirin 81 MG tablet Take 1 tablet (81 mg total) by mouth daily. 09/09/18   Herminio Commons, MD  carvedilol (COREG) 3.125 MG tablet Take 3.125 mg by mouth 2 (two) times daily. 05/17/20   [provider]  ferrous sulfate 325 (65 FE) MG EC tablet Take 325 mg by mouth daily with breakfast.    [provider]  finasteride (PROSCAR) 5 MG tablet Take 5 mg by mouth daily.    [provider]  furosemide (LASIX) 20 MG tablet Take 20 mg by mouth daily. 09/29/20   [provider]  losartan (COZAAR) 100 MG tablet Take 100 mg by mouth daily.    [provider]  Misc Natural Products (OSTEO BI-FLEX ADV JOINT SHIELD) TABS Take 1 tablet by mouth daily.     [provider]  Multiple Vitamins-Minerals (CENTRUM SILVER ADULT 50+) TABS Take 1 tablet by  mouth every morning.    [provider]  nitroGLYCERIN (NITROSTAT) 0.4 MG SL tablet Place 1 tablet (0.4 mg total) under the tongue every 5 (five) minutes as needed for chest pain. 07/03/14   Herminio Commons, MD  Omega-3 Fatty Acids (FISH OIL) 1000 MG CAPS Take 1 capsule by mouth daily.    [provider]  potassium chloride (KLOR-CON) 10 MEQ tablet Take 10 mEq by mouth daily. 09/29/20   [provider]  sertraline (ZOLOFT) 50 MG tablet Take 50 mg by mouth daily. 06/30/19   [provider]  vitamin B-12 (CYANOCOBALAMIN) 1000 MCG tablet 1 tablet (1,000 mcg total) by Per NG tube route daily. Patient taking differently: Take 1,000 mcg by mouth daily. 09/15/15   Izora Gala, MD    Physical Exam: Vitals:   03/18/21 1515 03/18/21 1530 03/18/21 1542 03/18/21 1600  BP:  (!) 184/89  (!) 181/92  Pulse: 77 78 79 83  Resp: 20 (!) 26  (!) 25 (!) 21  Temp:      TempSrc:      SpO2: 94% 92% 92% 90%     Vitals:   03/18/21 1515 03/18/21 1530 03/18/21 1542 03/18/21 1600  BP:  (!) 184/89  (!) 181/92  Pulse: 77 78 79 83  Resp: 20 (!) 26 (!) 25 (!) 21  Temp:      TempSrc:      SpO2: 94% 92% 92% 90%      Constitutional: Alert and oriented x 1.  Only to person. Not in any apparent distress HEENT:      Head: Normocephalic and atraumatic.         Eyes: PERLA, EOMI, Conjunctivae are normal. Sclera is non-icteric.       Mouth/Throat: Mucous membranes are moist.       Neck: Supple with no signs of meningismus. Cardiovascular: Regular rate and rhythm. No murmurs, gallops, or rubs. 2+ symmetrical distal pulses are present . No JVD. No LE edema Respiratory: Respiratory effort normal.  Crackles at the bases bilaterally.No wheezes or rhonchi.  Gastrointestinal: Soft, non tender, and non distended with positive bowel sounds.  Genitourinary: No CVA tenderness. Musculoskeletal: Nontender with normal range of motion in all extremities. No cyanosis, or erythema of extremities. Neurologic:  Face is symmetric. Moving all extremities. No gross focal neurologic deficits . Skin: Skin is warm, dry.  No rash or ulcers Psychiatric: Mood and affect are normal    Labs on Admission: I have personally reviewed following labs and imaging studies  CBC: Recent Labs  Lab 03/18/21 1034  WBC 10.0  NEUTROABS 8.0*  HGB 14.7  HCT 44.4  MCV 93.7  PLT 0000000*   Basic Metabolic Panel: Recent Labs  Lab 03/18/21 1034  NA 139  K 3.4*  CL 104  CO2 27  GLUCOSE 127*  BUN 19  CREATININE 0.76  CALCIUM 8.9   GFR: CrCl cannot be calculated (Unknown ideal weight.). Liver Function Tests: Recent Labs  Lab 03/18/21 1034  AST 31  ALT 20  ALKPHOS 46  BILITOT 1.0  PROT 7.5  ALBUMIN 3.6   No results for input(s): LIPASE, AMYLASE in the last 168 hours. No results for input(s): AMMONIA in the last 168 hours. Coagulation Profile: No results  for input(s): INR, PROTIME in the last 168 hours. Cardiac Enzymes: No results for input(s): CKTOTAL, CKMB, CKMBINDEX, TROPONINI in the last 168 hours. BNP (last 3 results) No results for input(s): PROBNP in the last 8760 hours. HbA1C: No results for input(s): HGBA1C  in the last 72 hours. CBG: No results for input(s): GLUCAP in the last 168 hours. Lipid Profile: No results for input(s): CHOL, HDL, LDLCALC, TRIG, CHOLHDL, LDLDIRECT in the last 72 hours. Thyroid Function Tests: No results for input(s): TSH, T4TOTAL, FREET4, T3FREE, THYROIDAB in the last 72 hours. Anemia Panel: No results for input(s): VITAMINB12, FOLATE, FERRITIN, TIBC, IRON, RETICCTPCT in the last 72 hours. Urine analysis:    Component Value Date/Time   COLORURINE YELLOW 10/20/2020 [place] 10/20/2020 1218   LABSPEC 1.010 10/20/2020 1218   PHURINE 5.5 10/20/2020 1218   GLUCOSEU NEGATIVE 10/20/2020 1218   HGBUR TRACE (A) 10/20/2020 1218   BILIRUBINUR NEGATIVE 10/20/2020 [place] 10/20/2020 1218   PROTEINUR NEGATIVE 10/20/2020 1218   UROBILINOGEN 1.0 11/27/2013 2214   NITRITE NEGATIVE 10/20/2020 1218   LEUKOCYTESUR TRACE (A) 10/20/2020 1218    Radiological Exams on Admission: CT Angio Chest PE W and/or Wo Contrast  Result Date: 03/18/2021 CLINICAL DATA:  87 year old male with shortness of breath, COVID positive. EXAM: CT ANGIOGRAPHY CHEST WITH CONTRAST TECHNIQUE: Multidetector CT imaging of the chest was performed using the standard protocol during bolus administration of intravenous contrast. Multiplanar CT image reconstructions and MIPs were obtained to evaluate the vascular anatomy. CONTRAST:  100 mL Omnipaque 350, intravenous COMPARISON:  09/25/2017 FINDINGS: Cardiovascular: Satisfactory opacification of the pulmonary arteries to the segmental level. No evidence of pulmonary embolism. Normal heart size. No pericardial effusion. Coronary, aortic valvular, mitral annular, and aortic  atherosclerotic calcifications are noted. Mediastinum/Nodes: No enlarged mediastinal, hilar, or axillary lymph nodes. Thyroid gland, trachea, and esophagus demonstrate no significant findings. Lungs/Pleura: Similar appearing bibasilar peribronchovascular thickening with associated dependent atelectasis and scattered consolidative opacities. No new consolidative opacities, significant pleural effusion, pneumothorax. Mild emphysematous changes are noted. Mild Upper Abdomen: The visualized upper abdomen is within normal limits. Musculoskeletal: No chest wall abnormality. No acute or significant osseous findings. Review of the MIP images confirms the above findings. IMPRESSION: Vascular: 1. No evidence of pulmonary embolism. 2. Coronary and aortic atherosclerosis (ICD10-I70.0). Non-Vascular: 1. Bibasilar bronchial wall thickening and associated atelectasis, similar to 2019 comparison, likely indicative of sequela of chronic aspiration. Superimposed pneumonia could appear similarly. 2.  Mild emphysema (ICD10-J43.9). Ruthann Cancer, MD [facility] Radiology Electronically Signed   By: Ruthann Cancer MD   On: 03/18/2021 13:44   DG Chest Port 1 View  Result Date: 03/18/2021 CLINICAL DATA:  87 year old male with shortness of breath for 1 week. Positive COVID-19. EXAM: PORTABLE CHEST 1 VIEW COMPARISON:  Chest radiographs 07/27/2019 and earlier. FINDINGS: Portable AP semi upright view at 1039 hours. Lower lung volumes. Indistinct increased lung base opacity greater on the left. Stable cardiac size and mediastinal contours. No pneumothorax or pulmonary edema. No definite pleural effusion. No other confluent pulmonary opacity. Visualized tracheal air column is within normal limits. Paucity of bowel gas in the upper abdomen. No acute osseous abnormality identified. IMPRESSION: Low lung volumes with nonspecific bibasilar opacity greater on the left, possibly atelectasis but left  lung base pneumonia not excluded. PA and lateral views would be helpful when feasible. Electronically Signed   By: Genevie Ann M.D.   On: 03/18/2021 11:02     Assessment/Plan Principal Problem:   Pneumonia due to COVID-19 virus Active Problems:   Atrial fibrillation (HCC)   HTN (hypertension)   CAD (coronary artery disease)   Acute respiratory failure due to COVID-19 [facility])   Chronic diastolic CHF (congestive heart failure) ([place])  Generalized weakness     Pneumonia due to COVID-19 virus with acute respiratory failure  Patient is unvaccinated and presents to the ER for evaluation of shortness of breath and weakness. Noted to be hypoxic with room air pulse oximetry of 88% and is currently on 2 L of oxygen Discussed with patient's healthcare power of attorney who consents to patient receiving IV remdesivir and systemic steroids per protocol Supportive care with bronchodilator therapy, antitussives and multivitamins Will monitor inflammatory markers    Generalized weakness Related to COVID-19 infection At baseline patient was able to ambulate with a rolling walker May benefit from PT evaluation depending on how he progresses    Depression Continue sertraline    Chronic diastolic dysfunction CHF Last known LVEF from a 2D echocardiogram done in 2019 was 50 to 55% Continue losartan, Lasix and carvedilol    Hypokalemia Secondary to diuretic use Supplement potassium     DVT prophylaxis: Lovenox  Code Status: DO NOT RESUSCITATE Family Communication: Greater than 50% of time was spent discussing patient's condition and plan of care with his healthcare power of attorney [name] over the phone.  All questions and concerns have been addressed.  He verbalizes understanding and agrees with the plan.  CODE STATUS was discussed and patient is a DO NOT RESUSCITATE. Disposition Plan: Back to previous home environment Consults called: none  Status: At the time of admission, it  appears that the appropriate admission status for this patient is inpatient. This is judged to be reasonable and necessary in order to provide the required intensity of service to ensure the patient's safety given the presenting symptoms, physical exam findings, and initial radiographic and laboratory data in the context of their comorbid conditions. Patient requires inpatient status due to high intensity of service, high risk for further deterioration and high frequency of surveillance required.    Collier Bullock MD [facility]     03/18/2021, 4:57 PM

## 2021-03-19 ENCOUNTER — Encounter (HOSPITAL_COMMUNITY): Payer: Self-pay | Admitting: Internal Medicine

## 2021-03-19 DIAGNOSIS — J1282 Pneumonia due to coronavirus disease 2019: Secondary | ICD-10-CM

## 2021-03-19 DIAGNOSIS — U071 COVID-19: Principal | ICD-10-CM

## 2021-03-19 LAB — COMPREHENSIVE METABOLIC PANEL
ALT: 17 U/L (ref 0–44)
AST: 24 U/L (ref 15–41)
Albumin: 3.2 g/dL — ABNORMAL LOW (ref 3.5–5.0)
Alkaline Phosphatase: 42 U/L (ref 38–126)
Anion gap: 8 (ref 5–15)
BUN: 20 mg/dL (ref 8–23)
CO2: 27 mmol/L (ref 22–32)
Calcium: 8.8 mg/dL — ABNORMAL LOW (ref 8.9–10.3)
Chloride: 105 mmol/L (ref 98–111)
Creatinine, Ser: 0.71 mg/dL (ref 0.61–1.24)
GFR, Estimated: >60 mL/min (ref >60)
Glucose, Bld: 154 mg/dL — ABNORMAL HIGH (ref 70–99)
Potassium: 3.8 mmol/L (ref 3.5–5.1)
Sodium: 140 mmol/L (ref 135–145)
Total Bilirubin: 0.9 mg/dL (ref 0.3–1.2)
Total Protein: 6.8 g/dL (ref 6.5–8.1)

## 2021-03-19 LAB — FERRITIN: Ferritin: 105 ng/mL (ref 24–336)

## 2021-03-19 LAB — CBC WITH DIFFERENTIAL/PLATELET
Abs Immature Granulocytes: 0.02 10*3/uL (ref 0.00–0.07)
Basophils Absolute: 0 10*3/uL (ref 0.0–0.1)
Basophils Relative: 0 %
Eosinophils Absolute: 0 10*3/uL (ref 0.0–0.5)
Eosinophils Relative: 0 %
HCT: 41.3 % (ref 39.0–52.0)
Hemoglobin: 13.8 g/dL (ref 13.0–17.0)
Immature Granulocytes: 0 %
Lymphocytes Relative: 9 %
Lymphs Abs: 0.4 10*3/uL — ABNORMAL LOW (ref 0.7–4.0)
MCH: 31.4 pg (ref 26.0–34.0)
MCHC: 33.4 g/dL (ref 30.0–36.0)
MCV: 94.1 fL (ref 80.0–100.0)
Monocytes Absolute: 0.3 10*3/uL (ref 0.1–1.0)
Monocytes Relative: 6 %
Neutro Abs: 4.3 10*3/uL (ref 1.7–7.7)
Neutrophils Relative %: 85 %
Platelets: 142 10*3/uL — ABNORMAL LOW (ref 150–400)
RBC: 4.39 MIL/uL (ref 4.22–5.81)
RDW: 13.7 % (ref 11.5–15.5)
WBC: 5 10*3/uL (ref 4.0–10.5)
nRBC: 0 % (ref 0.0–0.2)

## 2021-03-19 LAB — C-REACTIVE PROTEIN: CRP: 16.8 mg/dL — ABNORMAL HIGH (ref <1.0)

## 2021-03-19 LAB — D-DIMER, QUANTITATIVE: D-Dimer, Quant: 0.86 ug/mL-FEU — ABNORMAL HIGH (ref 0.00–0.50)

## 2021-03-19 LAB — PHOSPHORUS: Phosphorus: 3.3 mg/dL (ref 2.5–4.6)

## 2021-03-19 LAB — MAGNESIUM: Magnesium: 2.2 mg/dL (ref 1.7–2.4)

## 2021-03-19 MED ORDER — ORAL CARE MOUTH RINSE
15.0000 mL | Freq: Two times a day (BID) | OROMUCOSAL | Status: DC
  Administered 2021-03-19 – 2021-03-22 (×7): 15 mL via OROMUCOSAL

## 2021-03-19 NOTE — TOC Initial Note (Signed)
Transition of Care [facility]) - Initial/Assessment Note    Patient Details  Name: Manuel Le MRN: [id number] Date of Birth: 1927-01-24  Transition of [facility]) CM/SW Contact:    Natasha Bence, LCSW Phone Number: 03/19/2021, 4:21 PM  Clinical Narrative:                 Patient is a 87 year old male admitted for Pneumonia due to COVID-19 virus. CSW notified of patient's POA request for [facility] and hospital bed. CSW referred patient to [facility] for Hospital bed. CSW attempted to refer patient to [facility] and [place] for [facility]. CSW received no answer from [facility]. [facility] declined patient. TOC to follow.  Expected Discharge Plan: [place] Barriers to Discharge: Barriers Resolved   Patient Goals and CMS Choice Patient states their goals for this hospitalization and ongoing recovery are:: Return home with [facility] CMS Medicare.gov Compare Post Acute Care list provided to:: Patient Choice offered to / list presented to : Patient  Expected Discharge Plan and Services Expected Discharge Plan: [place]                         DME Arranged: Hospital bed DME Agency: [facility] Date DME Agency Contacted: 03/19/21 Time DME Agency Contacted: [phone] Representative spoke with at DME Agency: [email or web address]            Prior Living Arrangements/Services   Lives with:: Self, Adult Children Patient language and need for interpreter reviewed:: Yes        Need for Family Participation in Patient Care: Yes (Comment) Care giver support system in place?: Yes (comment)   Criminal Activity/Legal Involvement Pertinent to Current Situation/Hospitalization: No - Comment as needed  Activities of Daily Living Home Assistive Devices/Equipment: Walker (specify type), Wheelchair ADL Screening (condition at time of admission) Patient's cognitive ability adequate to safely complete daily activities?: Yes Is the patient deaf or have difficulty hearing?: Yes Does the patient have  difficulty seeing, even when wearing glasses/contacts?: Yes Does the patient have difficulty concentrating, remembering, or making decisions?: No Patient able to express need for assistance with ADLs?: Yes Does the patient have difficulty dressing or bathing?: Yes Independently performs ADLs?: No Does the patient have difficulty walking or climbing stairs?: Yes Weakness of Legs: Both Weakness of Arms/Hands: Both  Permission Sought/Granted   Permission granted to share information with : Yes, Verbal Permission Granted  Share Information with NAME: P[place],(POA) [phone]  Permission granted to share info w AGENCY: Local HH and DME  Permission granted to share info w Relationship: [name]     Emotional Assessment       Orientation: : Oriented to Self, Oriented to Place Alcohol / Substance Use: Not Applicable Psych Involvement: No (comment)  Admission diagnosis:  Pneumonia due to COVID-19 virus [U07.1, J12.82] Patient Active Problem List   Diagnosis Date Noted   Pneumonia due to COVID-19 virus 03/18/2021   Acute respiratory failure due to COVID-19 ([place]) 03/18/2021   Chronic diastolic CHF (congestive heart failure) ([place]) 03/18/2021   Generalized weakness 03/18/2021   Truncal ataxia 10/20/2020   Dizzy 10/20/2020   TIA (transient ischemic attack) 10/20/2020   Acute respiratory failure with hypoxia ([place])    Influenza with pneumonia    Palliative care encounter    Sepsis ([place]) 09/25/2017   Fever 09/25/2017   UTI (urinary tract infection) 09/25/2017   Abnormal liver function 09/25/2017   Tachycardia 09/25/2017   Influenza A  Pressure ulcer 09/14/2015   Zenker's diverticulum 09/09/2015   Bladder cancer ([place]) 07/14/2015   CAD (coronary artery disease) 07/24/2014   S/P coronary artery stent placement (RCA bare metal stent on 07/08/14) 07/24/2014   Exertional angina ([place]) 07/08/2014   Encounter for therapeutic drug monitoring 09/12/2013   Atrial fibrillation ([place]) 09/08/2013    HTN (hypertension) 09/08/2013   Fatigue 09/08/2013   Anemia 06/26/2012   PCP:  Celene Squibb, MD Pharmacy:   [facility] [place] [place] [address] Phone: [phone] Fax: [phone]     Social Determinants of Health (SDOH) Interventions    Readmission Risk Interventions No flowsheet data found.

## 2021-03-19 NOTE — Progress Notes (Signed)
PROGRESS NOTE    DONTAE Le  [id number] DOB: 1927-02-03 DOA: 03/18/2021 PCP: Celene Squibb, MD   Brief Narrative:   Manuel Le is a 87 y.o. male with medical history significant for coronary artery disease, hypertension, depression, chronic diastolic dysfunction CHF who was brought into the ER by EMS for evaluation of weakness and inability to ambulate for couple of days. At baseline patient ambulates with a rolling walker but over the last couple of days he has been very weak and unable to ambulate and was noted to be covered in feces and urine when he arrived to ER.  Patient was admitted with acute hypoxemic respiratory failure secondary to COVID-19 pneumonia.  PT evaluation pending.  Assessment & Plan:   Principal Problem:   Pneumonia due to COVID-19 virus Active Problems:   Atrial fibrillation (HCC)   HTN (hypertension)   CAD (coronary artery disease)   Acute respiratory failure due to COVID-19 (HCC)   Chronic diastolic CHF (congestive heart failure) (HCC)   Generalized weakness   Acute hypoxemic respiratory failure secondary to COVID-19 pneumonia -Patient is unvaccinated -IV remdesivir and systemic steroids per protocol -Wean oxygen as tolerated -Monitor inflammatory markers -Supportive measures -Continue isolation  Generalized weakness possibly related to above -At baseline he was able to ambulate with a rolling walker -PT evaluation ordered  Depression -Continue sertraline  Chronic diastolic CHF -Last known LVEF in 20 1950-55% -Continue losartan, Lasix, and carvedilol   DVT prophylaxis: Lovenox Code Status: DNR Family Communication: Discussed with nephew [name] 7/30 Disposition Plan:  Status is: Inpatient  Remains inpatient appropriate because:IV treatments appropriate due to intensity of illness or inability to take PO and Inpatient level of care appropriate due to severity of illness  Dispo: The patient is from: Home               Anticipated d/c is to: SNF              Patient currently is not medically stable to d/c.   Difficult to place patient No   Consultants:  Palliative  Procedures:  See below  Antimicrobials:  Anti-infectives (From admission, onward)    Start     Dose/Rate Route Frequency Ordered Stop   03/19/21 1000  remdesivir 100 mg in sodium chloride 0.9 % 100 mL IVPB  Status:  Discontinued       See Hyperspace for full Linked Orders Report.   100 mg 200 mL/hr over 30 Minutes Intravenous Daily 03/18/21 1650 03/18/21 1653   03/19/21 1000  remdesivir 100 mg in sodium chloride 0.9 % 100 mL IVPB        100 mg 200 mL/hr over 30 Minutes Intravenous Daily 03/18/21 1654 03/23/21 0959   03/18/21 1730  remdesivir 100 mg in sodium chloride 0.9 % 100 mL IVPB        100 mg 200 mL/hr over 30 Minutes Intravenous Every 1 hr x 2 03/18/21 1654 03/18/21 1905   03/18/21 1700  remdesivir 200 mg in sodium chloride 0.9% 250 mL IVPB  Status:  Discontinued       See Hyperspace for full Linked Orders Report.   200 mg 580 mL/hr over 30 Minutes Intravenous Once 03/18/21 1650 03/18/21 1653       Subjective: Patient seen and evaluated today with no new acute complaints or concerns. No acute concerns or events noted overnight.  Objective: Vitals:   03/19/21 0900 03/19/21 0930 03/19/21 0945 03/19/21 1100  BP: (!) 192/78 (!) 164/75  Marland Kitchen)  154/76  Pulse: 72 79  72  Resp: 18 (!) 23  18  Temp:      TempSrc:      SpO2: 95% 96% 96% 91%    Intake/Output Summary (Last 24 hours) at 03/19/2021 1219 Last data filed at 03/19/2021 0659 Gross per 24 hour  Intake 520 ml  Output 1150 ml  Net -630 ml   There were no vitals filed for this visit.  Examination:  General exam: Appears calm and comfortable  Respiratory system: Clear to auscultation. Respiratory effort normal. On 2L [place]. Cardiovascular system: S1 & S2 heard, RRR.  Gastrointestinal system: Abdomen is soft Central nervous system: Alert and awake Extremities: No  edema Skin: No significant lesions noted Psychiatry: Flat affect.    Data Reviewed: I have personally reviewed following labs and imaging studies  CBC: Recent Labs  Lab 03/18/21 1034 03/19/21 0608  WBC 10.0 5.0  NEUTROABS 8.0* 4.3  HGB 14.7 13.8  HCT 44.4 41.3  MCV 93.7 94.1  PLT 128* [id number]*   Basic Metabolic Panel: Recent Labs  Lab 03/18/21 1034 03/19/21 0608  NA 139 140  K 3.4* 3.8  CL 104 105  CO2 27 27  GLUCOSE 127* 154*  BUN 19 20  CREATININE 0.76 0.71  CALCIUM 8.9 8.8*  MG  --  2.2  PHOS  --  3.3   GFR: CrCl cannot be calculated (Unknown ideal weight.). Liver Function Tests: Recent Labs  Lab 03/18/21 1034 03/19/21 0608  AST 31 24  ALT 20 17  ALKPHOS 46 42  BILITOT 1.0 0.9  PROT 7.5 6.8  ALBUMIN 3.6 3.2*   No results for input(s): LIPASE, AMYLASE in the last 168 hours. No results for input(s): AMMONIA in the last 168 hours. Coagulation Profile: No results for input(s): INR, PROTIME in the last 168 hours. Cardiac Enzymes: No results for input(s): CKTOTAL, CKMB, CKMBINDEX, TROPONINI in the last 168 hours. BNP (last 3 results) No results for input(s): PROBNP in the last 8760 hours. HbA1C: No results for input(s): HGBA1C in the last 72 hours. CBG: No results for input(s): GLUCAP in the last 168 hours. Lipid Profile: Recent Labs    03/18/21 1552  TRIG 103   Thyroid Function Tests: No results for input(s): TSH, T4TOTAL, FREET4, T3FREE, THYROIDAB in the last 72 hours. Anemia Panel: Recent Labs    03/18/21 1552 03/19/21 0608  FERRITIN 106 105   Sepsis Labs: Recent Labs  Lab 03/18/21 1243 03/18/21 1552  PROCALCITON  --  0.14  LATICACIDVEN 1.0  --     Recent Results (from the past 240 hour(s))  Resp Panel by RT-PCR (Flu A&B, Covid) Nasopharyngeal Swab     Status: Abnormal   Collection Time: 03/18/21 12:39 PM   Specimen: Nasopharyngeal Swab; Nasopharyngeal(NP) swabs in vial transport medium  Result Value Ref Range Status   SARS  Coronavirus 2 by RT PCR POSITIVE (A) NEGATIVE Final    Comment: RESULT CALLED TO, READ BACK BY AND VERIFIED WITH:  DR [id number] BY STEPHTR (NOTE) SARS-CoV-2 target nucleic acids are DETECTED.  The SARS-CoV-2 RNA is generally detectable in upper respiratory specimens during the acute phase of infection. Positive results are indicative of the presence of the identified virus, but do not rule out bacterial infection or co-infection with other pathogens not detected by the test. Clinical correlation with patient history and other diagnostic information is necessary to determine patient infection status. The expected result is Negative.  Fact Sheet for Patients: EntrepreneurPulse.com.au  Fact Sheet for Healthcare  Providers: IncredibleEmployment.be  This test is not yet approved or cleared by the [place] and  has been authorized for detection and/or diagnosis of SARS-CoV-2 by FDA under an Emergency Use Authorization (EUA).  This EUA will remain in effect (meaning this test can b e used) for the duration of  the COVID-19 declaration under Section 564(b)(1) of the Act, 21 U.S.C. section 360bbb-3(b)(1), unless the authorization is terminated or revoked sooner.     Influenza A by PCR NEGATIVE NEGATIVE Final   Influenza B by PCR NEGATIVE NEGATIVE Final    Comment: (NOTE) The Xpert Xpress SARS-CoV-2/FLU/RSV plus assay is intended as an aid in the diagnosis of influenza from Nasopharyngeal swab specimens and should not be used as a sole basis for treatment. Nasal washings and aspirates are unacceptable for Xpert Xpress SARS-CoV-2/FLU/RSV testing.  Fact Sheet for Patients: EntrepreneurPulse.com.au  Fact Sheet for Healthcare Providers: IncredibleEmployment.be  This test is not yet approved or cleared by the [place] FDA and has been authorized for detection and/or diagnosis of SARS-CoV-2  by FDA under an Emergency Use Authorization (EUA). This EUA will remain in effect (meaning this test can be used) for the duration of the COVID-19 declaration under Section 564(b)(1) of the Act, 21 U.S.C. section 360bbb-3(b)(1), unless the authorization is terminated or revoked.  Performed at [facility], [address], [place], [place] [address]   Blood Culture (routine x 2)     Status: None (Preliminary result)   Collection Time: 03/18/21  4:02 PM   Specimen: Right Antecubital; Blood  Result Value Ref Range Status   Specimen Description RIGHT ANTECUBITAL  Final   Special Requests   Final    BOTTLES DRAWN AEROBIC AND ANAEROBIC Blood Culture adequate volume   Culture   Final    NO GROWTH < 24 HOURS Performed at [facility], [address] [place], [place] [address]    Report Status PENDING  Incomplete  Blood Culture (routine x 2)     Status: None (Preliminary result)   Collection Time: 03/18/21  4:03 PM   Specimen: BLOOD RIGHT ARM  Result Value Ref Range Status   Specimen Description BLOOD RIGHT ARM  Final   Special Requests   Final    BOTTLES DRAWN AEROBIC AND ANAEROBIC Blood Culture adequate volume   Culture   Final    NO GROWTH < 24 HOURS Performed at [facility], [address], [place], [place] [address]    Report Status PENDING  Incomplete         Radiology Studies: CT Angio Chest PE W and/or Wo Contrast  Result Date: 03/18/2021 CLINICAL DATA:  87 year old male with shortness of breath, COVID positive. EXAM: CT ANGIOGRAPHY CHEST WITH CONTRAST TECHNIQUE: Multidetector CT imaging of the chest was performed using the standard protocol during bolus administration of intravenous contrast. Multiplanar CT image reconstructions and MIPs were obtained to evaluate the vascular anatomy. CONTRAST:  100 mL Omnipaque 350, intravenous COMPARISON:  09/25/2017 FINDINGS: Cardiovascular: Satisfactory opacification of the pulmonary arteries to the segmental level. No evidence of  pulmonary embolism. Normal heart size. No pericardial effusion. Coronary, aortic valvular, mitral annular, and aortic atherosclerotic calcifications are noted. Mediastinum/Nodes: No enlarged mediastinal, hilar, or axillary lymph nodes. Thyroid gland, trachea, and esophagus demonstrate no significant findings. Lungs/Pleura: Similar appearing bibasilar peribronchovascular thickening with associated dependent atelectasis and scattered consolidative opacities. No new consolidative opacities, significant pleural effusion, pneumothorax. Mild emphysematous changes are noted. Mild Upper Abdomen: The visualized upper abdomen is within normal limits. Musculoskeletal: No  chest wall abnormality. No acute or significant osseous findings. Review of the MIP images confirms the above findings. IMPRESSION: Vascular: 1. No evidence of pulmonary embolism. 2. Coronary and aortic atherosclerosis (ICD10-I70.0). Non-Vascular: 1. Bibasilar bronchial wall thickening and associated atelectasis, similar to 2019 comparison, likely indicative of sequela of chronic aspiration. Superimposed pneumonia could appear similarly. 2.  Mild emphysema (ICD10-J43.9). Ruthann Cancer, MD [facility] Electronically Signed   By: Ruthann Cancer MD   On: 03/18/2021 13:44   DG Chest Port 1 View  Result Date: 03/18/2021 CLINICAL DATA:  87 year old male with shortness of breath for 1 week. Positive COVID-19. EXAM: PORTABLE CHEST 1 VIEW COMPARISON:  Chest radiographs 07/27/2019 and earlier. FINDINGS: Portable AP semi upright view at 1039 hours. Lower lung volumes. Indistinct increased lung base opacity greater on the left. Stable cardiac size and mediastinal contours. No pneumothorax or pulmonary edema. No definite pleural effusion. No other confluent pulmonary opacity. Visualized tracheal air column is within normal limits. Paucity of bowel gas in the upper abdomen. No acute osseous abnormality identified.  IMPRESSION: Low lung volumes with nonspecific bibasilar opacity greater on the left, possibly atelectasis but left lung base pneumonia not excluded. PA and lateral views would be helpful when feasible. Electronically Signed   By: Genevie Ann M.D.   On: 03/18/2021 11:02        Scheduled Meds:  albuterol  2 puff Inhalation Q6H   ascorbic acid  500 mg Oral Daily   aspirin EC  81 mg Oral Daily   carvedilol  3.125 mg Oral BID WC   dexamethasone (DECADRON) injection  6 mg Intravenous Q24H   enoxaparin (LOVENOX) injection  40 mg Subcutaneous Q24H   ferrous sulfate  325 mg Oral Q breakfast   finasteride  5 mg Oral Daily   furosemide  20 mg Oral Daily   losartan  100 mg Oral Daily   multivitamin with minerals  1 tablet Oral q morning   omega-3 acid ethyl esters  1 g Oral Daily   potassium chloride  10 mEq Oral Daily   sertraline  50 mg Oral Daily   sodium chloride flush  3 mL Intravenous Q12H   vitamin B-12  1,000 mcg Oral Daily   zinc sulfate  220 mg Oral Daily   Continuous Infusions:  sodium chloride     remdesivir 100 mg in NS 100 mL 100 mg (03/19/21 1118)     LOS: 1 day    Time spent: 35 minutes    Sammy Douthitt Darleen Crocker, DO [facility]  If 7PM-7AM, please contact night-coverage [email or web address] 03/19/2021, 12:19 PM

## 2021-03-20 DIAGNOSIS — I2510 Atherosclerotic heart disease of native coronary artery without angina pectoris: Secondary | ICD-10-CM | POA: Diagnosis not present

## 2021-03-20 DIAGNOSIS — I10 Essential (primary) hypertension: Secondary | ICD-10-CM | POA: Diagnosis not present

## 2021-03-20 LAB — C-REACTIVE PROTEIN: CRP: 8.7 mg/dL — ABNORMAL HIGH (ref <1.0)

## 2021-03-20 LAB — CBC WITH DIFFERENTIAL/PLATELET
Abs Immature Granulocytes: 0.05 10*3/uL (ref 0.00–0.07)
Basophils Absolute: 0 10*3/uL (ref 0.0–0.1)
Basophils Relative: 0 %
Eosinophils Absolute: 0 10*3/uL (ref 0.0–0.5)
Eosinophils Relative: 0 %
HCT: 41.4 % (ref 39.0–52.0)
Hemoglobin: 14 g/dL (ref 13.0–17.0)
Immature Granulocytes: 1 %
Lymphocytes Relative: 6 %
Lymphs Abs: 0.4 10*3/uL — ABNORMAL LOW (ref 0.7–4.0)
MCH: 30.6 pg (ref 26.0–34.0)
MCHC: 33.8 g/dL (ref 30.0–36.0)
MCV: 90.6 fL (ref 80.0–100.0)
Monocytes Absolute: 0.5 10*3/uL (ref 0.1–1.0)
Monocytes Relative: 7 %
Neutro Abs: 6.8 10*3/uL (ref 1.7–7.7)
Neutrophils Relative %: 86 %
Platelets: 175 10*3/uL (ref 150–400)
RBC: 4.57 MIL/uL (ref 4.22–5.81)
RDW: 13.1 % (ref 11.5–15.5)
WBC: 7.8 10*3/uL (ref 4.0–10.5)
nRBC: 0 % (ref 0.0–0.2)

## 2021-03-20 LAB — COMPREHENSIVE METABOLIC PANEL
ALT: 23 U/L (ref 0–44)
AST: 31 U/L (ref 15–41)
Albumin: 3.3 g/dL — ABNORMAL LOW (ref 3.5–5.0)
Alkaline Phosphatase: 44 U/L (ref 38–126)
Anion gap: 8 (ref 5–15)
BUN: 24 mg/dL — ABNORMAL HIGH (ref 8–23)
CO2: 24 mmol/L (ref 22–32)
Calcium: 8.6 mg/dL — ABNORMAL LOW (ref 8.9–10.3)
Chloride: 105 mmol/L (ref 98–111)
Creatinine, Ser: 0.65 mg/dL (ref 0.61–1.24)
GFR, Estimated: >60 mL/min (ref >60)
Glucose, Bld: 161 mg/dL — ABNORMAL HIGH (ref 70–99)
Potassium: 3.4 mmol/L — ABNORMAL LOW (ref 3.5–5.1)
Sodium: 137 mmol/L (ref 135–145)
Total Bilirubin: 0.9 mg/dL (ref 0.3–1.2)
Total Protein: 6.9 g/dL (ref 6.5–8.1)

## 2021-03-20 LAB — MAGNESIUM: Magnesium: 2.2 mg/dL (ref 1.7–2.4)

## 2021-03-20 LAB — FERRITIN: Ferritin: 121 ng/mL (ref 24–336)

## 2021-03-20 LAB — PHOSPHORUS: Phosphorus: 2.8 mg/dL (ref 2.5–4.6)

## 2021-03-20 LAB — D-DIMER, QUANTITATIVE: D-Dimer, Quant: 0.93 ug/mL-FEU — ABNORMAL HIGH (ref 0.00–0.50)

## 2021-03-20 MED ORDER — POTASSIUM CHLORIDE CRYS ER 20 MEQ PO TBCR
40.0000 meq | EXTENDED_RELEASE_TABLET | Freq: Once | ORAL | Status: AC
  Administered 2021-03-20: 40 meq via ORAL
  Filled 2021-03-20: qty 2

## 2021-03-20 NOTE — Progress Notes (Signed)
PROGRESS NOTE    Manuel Le  [id number] DOB: 1927/02/05 DOA: 03/18/2021 PCP: Celene Squibb, MD   Brief Narrative:   Manuel Le is a 87 y.o. male with medical history significant for coronary artery disease, hypertension, depression, chronic diastolic dysfunction CHF who was brought into the ER by EMS for evaluation of weakness and inability to ambulate for couple of days. At baseline patient ambulates with a rolling walker but over the last couple of days he has been very weak and unable to ambulate and was noted to be covered in feces and urine when he arrived to ER.  Patient was admitted with acute hypoxemic respiratory failure secondary to COVID-19 pneumonia.  PT evaluation pending as well as palliative evaluation.  Assessment & Plan:   Principal Problem:   Pneumonia due to COVID-19 virus Active Problems:   Atrial fibrillation (HCC)   HTN (hypertension)   CAD (coronary artery disease)   Acute respiratory failure due to COVID-19 (HCC)   Chronic diastolic CHF (congestive heart failure) (HCC)   Generalized weakness   Acute hypoxemic respiratory failure secondary to COVID-19 pneumonia -Patient is unvaccinated -IV remdesivir and systemic steroids per protocol -Wean oxygen as tolerated -Monitor inflammatory markers -Supportive measures -Continue isolation  Mild hypokalemia -Replete and reevaluate in a.m.   Generalized weakness possibly related to above -At baseline he was able to ambulate with a rolling walker -PT evaluation ordered   Depression -Continue sertraline   Chronic diastolic CHF -Last known LVEF in 20 1950-55% -Continue losartan, Lasix, and carvedilol     DVT prophylaxis: Lovenox Code Status: DNR Family Communication: Discussed with nephew [name] 7/31 Disposition Plan:  Status is: Inpatient   Remains inpatient appropriate because:IV treatments appropriate due to intensity of illness or inability to take PO and Inpatient level of care  appropriate due to severity of illness   Dispo: The patient is from: Home              Anticipated d/c is to: Home with PT/RN vs hospice              Patient currently is not medically stable to d/c.              Difficult to place patient No     Consultants:  Palliative   Procedures:  See below  Antimicrobials:  Anti-infectives (From admission, onward)    Start     Dose/Rate Route Frequency Ordered Stop   03/19/21 1000  remdesivir 100 mg in sodium chloride 0.9 % 100 mL IVPB  Status:  Discontinued       See Hyperspace for full Linked Orders Report.   100 mg 200 mL/hr over 30 Minutes Intravenous Daily 03/18/21 1650 03/18/21 1653   03/19/21 1000  remdesivir 100 mg in sodium chloride 0.9 % 100 mL IVPB        100 mg 200 mL/hr over 30 Minutes Intravenous Daily 03/18/21 1654 03/23/21 0959   03/18/21 1730  remdesivir 100 mg in sodium chloride 0.9 % 100 mL IVPB        100 mg 200 mL/hr over 30 Minutes Intravenous Every 1 hr x 2 03/18/21 1654 03/18/21 1905   03/18/21 1700  remdesivir 200 mg in sodium chloride 0.9% 250 mL IVPB  Status:  Discontinued       See Hyperspace for full Linked Orders Report.   200 mg 580 mL/hr over 30 Minutes Intravenous Once 03/18/21 1650 03/18/21 1653      Subjective: Patient seen and  evaluated today with no new acute complaints or concerns. No acute concerns or events noted overnight.  Objective: Vitals:   03/19/21 1951 03/19/21 2019 03/20/21 0318 03/20/21 0820  BP:  (!) 176/72 (!) 189/66   Pulse: 70 72 70   Resp: '20 17 16   '$ Temp:  98.4 F (36.9 C) 98.5 F (36.9 C)   TempSrc:  Oral    SpO2: 92% 90% 98% 91%    Intake/Output Summary (Last 24 hours) at 03/20/2021 1050 Last data filed at 03/20/2021 0900 Gross per 24 hour  Intake 480 ml  Output 1350 ml  Net -870 ml   There were no vitals filed for this visit.  Examination:  General exam: Appears calm and comfortable  Respiratory system: Clear to auscultation. Respiratory effort normal.  Continues on 3L [place]. Cardiovascular system: S1 & S2 heard, RRR.  Gastrointestinal system: Abdomen is soft Central nervous system: Alert and awake Extremities: No edema Skin: No significant lesions noted Psychiatry: Flat affect.    Data Reviewed: I have personally reviewed following labs and imaging studies  CBC: Recent Labs  Lab 03/18/21 1034 03/19/21 0608 03/20/21 0634  WBC 10.0 5.0 7.8  NEUTROABS 8.0* 4.3 6.8  HGB 14.7 13.8 14.0  HCT 44.4 41.3 41.4  MCV 93.7 94.1 90.6  PLT 128* 142* 0000000   Basic Metabolic Panel: Recent Labs  Lab 03/18/21 1034 03/19/21 0608 03/20/21 0634  NA 139 140 137  K 3.4* 3.8 3.4*  CL 104 105 105  CO2 '27 27 24  '$ GLUCOSE 127* 154* 161*  BUN 19 20 24*  CREATININE 0.76 0.71 0.65  CALCIUM 8.9 8.8* 8.6*  MG  --  2.2 2.2  PHOS  --  3.3 2.8   GFR: CrCl cannot be calculated (Unknown ideal weight.). Liver Function Tests: Recent Labs  Lab 03/18/21 1034 03/19/21 0608 03/20/21 0634  AST '31 24 31  '$ ALT '20 17 23  '$ ALKPHOS 46 42 44  BILITOT 1.0 0.9 0.9  PROT 7.5 6.8 6.9  ALBUMIN 3.6 3.2* 3.3*   No results for input(s): LIPASE, AMYLASE in the last 168 hours. No results for input(s): AMMONIA in the last 168 hours. Coagulation Profile: No results for input(s): INR, PROTIME in the last 168 hours. Cardiac Enzymes: No results for input(s): CKTOTAL, CKMB, CKMBINDEX, TROPONINI in the last 168 hours. BNP (last 3 results) No results for input(s): PROBNP in the last 8760 hours. HbA1C: No results for input(s): HGBA1C in the last 72 hours. CBG: No results for input(s): GLUCAP in the last 168 hours. Lipid Profile: Recent Labs    03/18/21 1552  TRIG 103   Thyroid Function Tests: No results for input(s): TSH, T4TOTAL, FREET4, T3FREE, THYROIDAB in the last 72 hours. Anemia Panel: Recent Labs    03/19/21 0608 03/20/21 0634  FERRITIN 105 121   Sepsis Labs: Recent Labs  Lab 03/18/21 1243 03/18/21 1552  PROCALCITON  --  0.14  LATICACIDVEN 1.0   --     Recent Results (from the past 240 hour(s))  Resp Panel by RT-PCR (Flu A&B, Covid) Nasopharyngeal Swab     Status: Abnormal   Collection Time: 03/18/21 12:39 PM   Specimen: Nasopharyngeal Swab; Nasopharyngeal(NP) swabs in vial transport medium  Result Value Ref Range Status   SARS Coronavirus 2 by RT PCR POSITIVE (A) NEGATIVE Final    Comment: RESULT CALLED TO, READ BACK BY AND VERIFIED WITH:  DR [id number]/29/22 BY STEPHTR (NOTE) SARS-CoV-2 target nucleic acids are DETECTED.  The SARS-CoV-2 RNA is generally detectable  in upper respiratory specimens during the acute phase of infection. Positive results are indicative of the presence of the identified virus, but do not rule out bacterial infection or co-infection with other pathogens not detected by the test. Clinical correlation with patient history and other diagnostic information is necessary to determine patient infection status. The expected result is Negative.  Fact Sheet for Patients: EntrepreneurPulse.com.au  Fact Sheet for Healthcare Providers: IncredibleEmployment.be  This test is not yet approved or cleared by the [place] FDA and  has been authorized for detection and/or diagnosis of SARS-CoV-2 by FDA under an Emergency Use Authorization (EUA).  This EUA will remain in effect (meaning this test can b e used) for the duration of  the COVID-19 declaration under Section 564(b)(1) of the Act, 21 U.S.C. section 360bbb-3(b)(1), unless the authorization is terminated or revoked sooner.     Influenza A by PCR NEGATIVE NEGATIVE Final   Influenza B by PCR NEGATIVE NEGATIVE Final    Comment: (NOTE) The Xpert Xpress SARS-CoV-2/FLU/RSV plus assay is intended as an aid in the diagnosis of influenza from Nasopharyngeal swab specimens and should not be used as a sole basis for treatment. Nasal washings and aspirates are unacceptable for Xpert Xpress  SARS-CoV-2/FLU/RSV testing.  Fact Sheet for Patients: EntrepreneurPulse.com.au  Fact Sheet for Healthcare Providers: IncredibleEmployment.be  This test is not yet approved or cleared by the [place] FDA and has been authorized for detection and/or diagnosis of SARS-CoV-2 by FDA under an Emergency Use Authorization (EUA). This EUA will remain in effect (meaning this test can be used) for the duration of the COVID-19 declaration under Section 564(b)(1) of the Act, 21 U.S.C. section 360bbb-3(b)(1), unless the authorization is terminated or revoked.  Performed at [facility], [address] [place], [place] [address]   Blood Culture (routine x 2)     Status: None (Preliminary result)   Collection Time: 03/18/21  4:02 PM   Specimen: Right Antecubital; Blood  Result Value Ref Range Status   Specimen Description RIGHT ANTECUBITAL  Final   Special Requests   Final    BOTTLES DRAWN AEROBIC AND ANAEROBIC Blood Culture adequate volume   Culture   Final    NO GROWTH 2 DAYS Performed at [facility], [address], [place], [place] [address]    Report Status PENDING  Incomplete  Blood Culture (routine x 2)     Status: None (Preliminary result)   Collection Time: 03/18/21  4:03 PM   Specimen: BLOOD RIGHT ARM  Result Value Ref Range Status   Specimen Description BLOOD RIGHT ARM  Final   Special Requests   Final    BOTTLES DRAWN AEROBIC AND ANAEROBIC Blood Culture adequate volume   Culture   Final    NO GROWTH 2 DAYS Performed at [facility], [address], [place], [place] [address]    Report Status PENDING  Incomplete         Radiology Studies: CT Angio Chest PE W and/or Wo Contrast  Result Date: 03/18/2021 CLINICAL DATA:  87 year old male with shortness of breath, COVID positive. EXAM: CT ANGIOGRAPHY CHEST WITH CONTRAST TECHNIQUE: Multidetector CT imaging of the chest was performed using the standard protocol during bolus administration  of intravenous contrast. Multiplanar CT image reconstructions and MIPs were obtained to evaluate the vascular anatomy. CONTRAST:  100 mL Omnipaque 350, intravenous COMPARISON:  09/25/2017 FINDINGS: Cardiovascular: Satisfactory opacification of the pulmonary arteries to the segmental level. No evidence of pulmonary embolism. Normal heart size. No pericardial effusion. Coronary,  aortic valvular, mitral annular, and aortic atherosclerotic calcifications are noted. Mediastinum/Nodes: No enlarged mediastinal, hilar, or axillary lymph nodes. Thyroid gland, trachea, and esophagus demonstrate no significant findings. Lungs/Pleura: Similar appearing bibasilar peribronchovascular thickening with associated dependent atelectasis and scattered consolidative opacities. No new consolidative opacities, significant pleural effusion, pneumothorax. Mild emphysematous changes are noted. Mild Upper Abdomen: The visualized upper abdomen is within normal limits. Musculoskeletal: No chest wall abnormality. No acute or significant osseous findings. Review of the MIP images confirms the above findings. IMPRESSION: Vascular: 1. No evidence of pulmonary embolism. 2. Coronary and aortic atherosclerosis (ICD10-I70.0). Non-Vascular: 1. Bibasilar bronchial wall thickening and associated atelectasis, similar to 2019 comparison, likely indicative of sequela of chronic aspiration. Superimposed pneumonia could appear similarly. 2.  Mild emphysema (ICD10-J43.9). Ruthann Cancer, MD [facility] Radiology Electronically Signed   By: Ruthann Cancer MD   On: 03/18/2021 13:44        Scheduled Meds:  albuterol  2 puff Inhalation Q6H   ascorbic acid  500 mg Oral Daily   aspirin EC  81 mg Oral Daily   carvedilol  3.125 mg Oral BID WC   dexamethasone (DECADRON) injection  6 mg Intravenous Q24H   enoxaparin (LOVENOX) injection  40 mg Subcutaneous Q24H   ferrous sulfate  325 mg Oral Q breakfast    finasteride  5 mg Oral Daily   furosemide  20 mg Oral Daily   losartan  100 mg Oral Daily   mouth rinse  15 mL Mouth Rinse BID   multivitamin with minerals  1 tablet Oral q morning   omega-3 acid ethyl esters  1 g Oral Daily   potassium chloride  10 mEq Oral Daily   potassium chloride  40 mEq Oral Once   sertraline  50 mg Oral Daily   sodium chloride flush  3 mL Intravenous Q12H   vitamin B-12  1,000 mcg Oral Daily   zinc sulfate  220 mg Oral Daily   Continuous Infusions:  sodium chloride     remdesivir 100 mg in NS 100 mL Stopped (03/19/21 1228)     LOS: 2 days    Time spent: 35 minutes    Shara Hartis Darleen Crocker, DO [facility]  If 7PM-7AM, please contact night-coverage [email or web address] 03/20/2021, 10:50 AM

## 2021-03-20 NOTE — TOC Progression Note (Signed)
Transition of Care [facility]) - Progression Note    Patient Details  Name: TAITUM WENGERD MRN: [id number] Date of Birth: 1926/10/11  [facility] CM/SW Contact  Natasha Bence, LCSW Phone Number: 03/20/2021, 12:14 PM  Clinical Narrative:    Marjory Lies with [facility] agreeable to take patient if discharging on 03/21/2021. TOC to follow.   Expected Discharge Plan: [place] Barriers to Discharge: Barriers Resolved  Expected Discharge Plan and Services Expected Discharge Plan: [place]                         DME Arranged: Hospital bed DME Agency: [facility] Date DME Agency Contacted: 03/19/21 Time DME Agency Contacted: [phone] Representative spoke with at DME Agency: [email or web address]             Social Determinants of Health (English) Interventions    Readmission Risk Interventions No flowsheet data found.

## 2021-03-21 ENCOUNTER — Encounter (HOSPITAL_COMMUNITY): Payer: Self-pay | Admitting: Internal Medicine

## 2021-03-21 LAB — CBC WITH DIFFERENTIAL/PLATELET
Abs Immature Granulocytes: 0.05 10*3/uL (ref 0.00–0.07)
Basophils Absolute: 0 10*3/uL (ref 0.0–0.1)
Basophils Relative: 0 %
Eosinophils Absolute: 0 10*3/uL (ref 0.0–0.5)
Eosinophils Relative: 0 %
HCT: 44.8 % (ref 39.0–52.0)
Hemoglobin: 15 g/dL (ref 13.0–17.0)
Immature Granulocytes: 0 %
Lymphocytes Relative: 4 %
Lymphs Abs: 0.5 10*3/uL — ABNORMAL LOW (ref 0.7–4.0)
MCH: 30.9 pg (ref 26.0–34.0)
MCHC: 33.5 g/dL (ref 30.0–36.0)
MCV: 92.4 fL (ref 80.0–100.0)
Monocytes Absolute: 0.7 10*3/uL (ref 0.1–1.0)
Monocytes Relative: 6 %
Neutro Abs: 10.3 10*3/uL — ABNORMAL HIGH (ref 1.7–7.7)
Neutrophils Relative %: 90 %
Platelets: 205 10*3/uL (ref 150–400)
RBC: 4.85 MIL/uL (ref 4.22–5.81)
RDW: 13.3 % (ref 11.5–15.5)
WBC: 11.5 10*3/uL — ABNORMAL HIGH (ref 4.0–10.5)
nRBC: 0 % (ref 0.0–0.2)

## 2021-03-21 LAB — COMPREHENSIVE METABOLIC PANEL
ALT: 26 U/L (ref 0–44)
AST: 29 U/L (ref 15–41)
Albumin: 3.3 g/dL — ABNORMAL LOW (ref 3.5–5.0)
Alkaline Phosphatase: 42 U/L (ref 38–126)
Anion gap: 9 (ref 5–15)
BUN: 23 mg/dL (ref 8–23)
CO2: 26 mmol/L (ref 22–32)
Calcium: 8.8 mg/dL — ABNORMAL LOW (ref 8.9–10.3)
Chloride: 105 mmol/L (ref 98–111)
Creatinine, Ser: 0.66 mg/dL (ref 0.61–1.24)
GFR, Estimated: >60 mL/min (ref >60)
Glucose, Bld: 158 mg/dL — ABNORMAL HIGH (ref 70–99)
Potassium: 3.7 mmol/L (ref 3.5–5.1)
Sodium: 140 mmol/L (ref 135–145)
Total Bilirubin: 0.7 mg/dL (ref 0.3–1.2)
Total Protein: 7 g/dL (ref 6.5–8.1)

## 2021-03-21 LAB — MAGNESIUM: Magnesium: 2.2 mg/dL (ref 1.7–2.4)

## 2021-03-21 LAB — C-REACTIVE PROTEIN: CRP: 6.2 mg/dL — ABNORMAL HIGH (ref <1.0)

## 2021-03-21 LAB — FERRITIN: Ferritin: 112 ng/mL (ref 24–336)

## 2021-03-21 LAB — PHOSPHORUS: Phosphorus: 3 mg/dL (ref 2.5–4.6)

## 2021-03-21 LAB — D-DIMER, QUANTITATIVE: D-Dimer, Quant: 0.72 ug/mL-FEU — ABNORMAL HIGH (ref 0.00–0.50)

## 2021-03-21 MED ORDER — GUAIFENESIN-DM 100-10 MG/5ML PO SYRP: 10.0000 mL | ORAL_SOLUTION | ORAL | 0 refills | Status: DC | PRN

## 2021-03-21 MED ORDER — PREDNISONE 20 MG PO TABS: 40.0000 mg | ORAL_TABLET | Freq: Every day | ORAL | 0 refills | Status: AC

## 2021-03-21 NOTE — Care Management Important Message (Signed)
Important Message  Patient Details  Name: Manuel Le MRN: [id number] Date of Birth: 1926-09-05   Medicare Important Message Given:  Yes - Important Message mailed due to current National Emergency     Tommy Medal 03/21/2021, 11:18 AM

## 2021-03-21 NOTE — Progress Notes (Deleted)
Pulse oximetry on room air is 80% while attempting to ambulate.

## 2021-03-21 NOTE — Plan of Care (Signed)
  Problem: Acute Rehab PT Goals(only PT should resolve) Goal: Pt Will Go Supine/Side To Sit Outcome: Progressing Flowsheets (Taken 03/21/2021 1223) Pt will go Supine/Side to Sit: with minimal assist Goal: Patient Will Transfer Sit To/From Stand Outcome: Progressing Flowsheets (Taken 03/21/2021 1223) Patient will transfer sit to/from stand: with minimal assist Goal: Pt Will Transfer Bed To Chair/Chair To Bed Outcome: Progressing Flowsheets (Taken 03/21/2021 1223) Pt will Transfer Bed to Chair/Chair to Bed:  with min assist  with mod assist Goal: Pt Will Ambulate Outcome: Progressing Flowsheets (Taken 03/21/2021 1223) Pt will Ambulate:  25 feet  15 feet  with moderate assist  with rolling walker  with minimal assist   12:24 PM, 03/21/21 [name], MPT Physical Therapist with [facility] [phone] office [phone] mobile phone

## 2021-03-21 NOTE — Evaluation (Signed)
Physical Therapy Evaluation Patient Details Name: Manuel Le MRN: [id number] DOB: 1927/07/04 Today's Date: 03/21/2021   History of Present Illness  Manuel Le is a 87 y.o. male with medical history significant for coronary artery disease, hypertension, depression, chronic diastolic dysfunction CHF who was brought into the ER by EMS for evaluation of weakness and inability to ambulate for couple of days.  At baseline patient ambulates with a rolling walker but over the last couple of days he has been very weak and unable to ambulate and was noted to be covered in feces and urine when he arrived to ER.  He also complained of shortness of breath as well as a cough.  Patient is unvaccinated.  I am unable to do review of systems due to his underlying mental status.  Labs show sodium 139, potassium 3.4, chloride 104, bicarb 27, glucose 127, BUN 19, creatinine 0.76, calcium 8.9, alkaline phosphatase 46, albumin 3.6, AST 31, ALT 20, total protein 7.5, lactic acid 1.0, procalcitonin 0.14, white count 10.0, hemoglobin 14.7, hematocrit 44.4, MCV 93.7, RDW 13.9, platelet count 128, D-dimer 1.0, fibrinogen 669  Chest x-ray reviewed by me shows low lung volumes with nonspecific bibasilar opacity greater on the left possibly atelectasis but left lung base pneumonia not excluded.  CT angiogram of the chest shows bibasilar bronchial wall thickening and associated atelectasis similar to 2019 likely indicative of sequela of chronic aspiration.  Superimposed pneumonia could appear similarly.  Mild emphysema.  Twelve-lead EKG reviewed by me shows sinus rhythm with PVCs, right bundle branch block and left anterior fascicular block.   Clinical Impression  Patient demonstrates slow labored movement for sitting up at bedside requiring frequent verbal/tactile cueing for proper placement of hands during supine to sitting with fair carryover, very unsteady on feet, high risk for falls and limited to a few steps at bedside before  having to sit due to fatigue.  Patient tolerated sitting up in chair after therapy - LPN notified.  Patient will benefit from continued physical therapy in hospital and recommended venue below to increase strength, balance, endurance for safe ADLs and gait.       Follow Up Recommendations SNF;Supervision for mobility/OOB;Supervision - Intermittent    Equipment Recommendations  None recommended by PT    Recommendations for Other Services       Precautions / Restrictions Precautions Precautions: Fall Restrictions Weight Bearing Restrictions: No      Mobility  Bed Mobility Overal bed mobility: Needs Assistance Bed Mobility: Supine to Sit     Supine to sit: Min assist;Mod assist     General bed mobility comments: increased time, labored movement, frequent verbal cueing for proper hand placement duirng supine to sitting    Transfers Overall transfer level: Needs assistance Equipment used: Rolling walker (2 wheeled) Transfers: Sit to/from [facility] Sit to Stand: Mod assist Stand pivot transfers: Mod assist       General transfer comment: unsteady labored movement  Ambulation/Gait Ambulation/Gait assistance: Mod assist;Max assist Gait Distance (Feet): 4 Feet Assistive device: Rolling walker (2 wheeled) Gait Pattern/deviations: Decreased step length - right;Decreased step length - left;Decreased stride length Gait velocity: decreased   General Gait Details: limited to 4-5 slow labored unsteady side steps before having to sit due to fatigue and poor standing balance  Stairs            Wheelchair Mobility    Modified Rankin (Stroke Patients Only)       Balance Overall balance assessment: Needs assistance Sitting-balance support:  Feet supported;No upper extremity supported Sitting balance-Leahy Scale: Fair Sitting balance - Comments: fair/good seated at EOB   Standing balance support: During functional activity;Bilateral upper extremity  supported Standing balance-Leahy Scale: Poor Standing balance comment: using RW                             Pertinent Vitals/Pain Pain Assessment: No/denies pain    Home Living Family/patient expects to be discharged to:: Private residence Living Arrangements: Alone Available Help at Discharge: Family;Available 24 hours/day Type of Home: House Home Access: Ramped entrance     Home Layout: Two level;Able to live on main level with bedroom/bathroom;Other (Comment) Home Equipment: Walker - 4 wheels;Cane - single point;Wheelchair - manual;Bedside commode Additional Comments: information taken from previous admission, patient is poor historian    Prior Function Level of Independence: Needs assistance   Gait / Transfers Assistance Needed: assisted household ambulator using Rollator and assisted for transfers  ADL's / Homemaking Assistance Needed: assisted by family        Hand Dominance   Dominant Hand: Right    Extremity/Trunk Assessment   Upper Extremity Assessment Upper Extremity Assessment: Generalized weakness    Lower Extremity Assessment Lower Extremity Assessment: Generalized weakness    Cervical / Trunk Assessment Cervical / Trunk Assessment: Normal  Communication   Communication: Other (comment) (poor carryover for answerning questions)  Cognition Arousal/Alertness: Awake/alert Behavior During Therapy: Flat affect Overall Cognitive Status: No family/caregiver present to determine baseline cognitive functioning                                        General Comments      Exercises     Assessment/Plan    PT Assessment Patient needs continued PT services  PT Problem List Decreased strength;Decreased activity tolerance;Decreased balance;Decreased mobility       PT Treatment Interventions DME instruction;Gait training;Stair training;Functional mobility training;Therapeutic activities;Therapeutic exercise;Patient/family  education;Balance training    PT Goals (Current goals can be found in the Care Plan section)  Acute Rehab PT Goals Patient Stated Goal: return home with family to assist PT Goal Formulation: With patient Time For Goal Achievement: 04/04/21 Potential to Achieve Goals: Good    Frequency Min 3X/week   Barriers to discharge        Co-evaluation               AM-PAC PT "6 Clicks" Mobility  Outcome Measure Help needed turning from your back to your side while in a flat bed without using bedrails?: A Lot Help needed moving from lying on your back to sitting on the side of a flat bed without using bedrails?: A Lot Help needed moving to and from a bed to a chair (including a wheelchair)?: A Lot Help needed standing up from a chair using your arms (e.g., wheelchair or bedside chair)?: A Lot Help needed to walk in hospital room?: A Lot Help needed climbing 3-5 steps with a railing? : Total 6 Click Score: 11    End of Session Equipment Utilized During Treatment: Oxygen Activity Tolerance: Patient tolerated treatment well;Patient limited by fatigue Patient left: in chair;with call bell/phone within reach;with chair alarm set Nurse Communication: Mobility status PT Visit Diagnosis: Unsteadiness on feet (R26.81);Other abnormalities of gait and mobility (R26.89);Muscle weakness (generalized) (M62.81)    Time: [id number] PT Time Calculation (min) (ACUTE ONLY): 22 min  Charges:   PT Evaluation $PT Eval Moderate Complexity: 1 Mod PT Treatments $Therapeutic Activity: 8-22 mins        12:22 PM, 03/21/21 [name], MPT Physical Therapist with [facility] [phone] office [phone] mobile phone

## 2021-03-21 NOTE — TOC Transition Note (Signed)
Transition of Care [facility]) - CM/SW Discharge Note   Patient Details  Name: Manuel Le MRN: [id number] Date of Birth: 03-17-1927  Transition of Care [facility]) CM/SW Contact:  Boneta Lucks, RN Phone Number: 03/21/2021, 4:25 PM   Clinical Narrative:   [name] with [employer] called to confirm to bed is the next stop for delivery. TOC called the POA, he ask that patient not come home late. Medical necessity printed,called EMS, updated MD/RN with the plan and to cancel EMS if they are not here by 8PM.   Final next level of care: Home/Self Care Barriers to Discharge: Barriers Resolved  Patient Goals and CMS Choice Patient states their goals for this hospitalization and ongoing recovery are:: Return home with [facility] CMS Medicare.gov Compare Post Acute Care list provided to:: Patient Choice offered to / list presented to : Patient  Discharge Placement               Patient to be transferred to facility by: EMS Name of family member notified: POA - Charles Patient and family notified of of transfer: 03/21/21  Discharge Plan and Services               DME Arranged: Hospital bed DME Agency: [facility] Date DME Agency Contacted: 03/19/21 Time DME Agency Contacted: [phone] Representative spoke with at DME Agency: jasmine      Readmission Risk Interventions Readmission Risk Prevention Plan 03/21/2021  Transportation Screening Complete  PCP or Specialist Appt within 3-5 Days Complete  HRI or [place] Complete  Social Work Consult for [place] Planning/Counseling Complete  Palliative Care Screening Not Applicable  Medication Review [employer] photographer) Complete  Some recent data might be hidden

## 2021-03-21 NOTE — TOC Progression Note (Addendum)
Transition of Care [facility]) - Progression Note    Patient Details  Name: Manuel Le MRN: [id number] Date of Birth: [birth year]  Transition of Care [facility] CM/SW Contact  Salome Arnt, Force Phone Number: 03/21/2021, 1:21 PM  Clinical Narrative:  TOC received consult indicating HCPOA requesting home hospice. LCSW spoke with HCPOA, [name] and explained difference in home health and hospice. He indicates they are not ready for hospice and would like to have home health. Pt has a family member living with him who is full-time caregiver. LCSW discussed palliative services and he is agreeable to outpatient follow up. He requests [facility]. Referral made. Pt will need home O2. LCSW called [facility] as hospital bed was already referred there. Per [name] with [facility], pt's insurance requires prior auth for hospital bed and O2. Awaiting authorization prior to delivery. MD and [name] updated. Once delivered, pt will require EMS transport home.    Update: Discussed recommendation of SNF with [name]. He reports they would prefer for pt to return home.    Expected Discharge Plan: [place] Barriers to Discharge: Barriers Resolved  Expected Discharge Plan and Services Expected Discharge Plan: [place]         Expected Discharge Date: 03/21/21               DME Arranged: Hospital bed DME Agency: [facility] Date DME Agency Contacted: 03/19/21 Time DME Agency Contacted: [phone] Representative spoke with at DME Agency: jasmine             Social Determinants of Health ([place]) Interventions    Readmission Risk Interventions No flowsheet data found.

## 2021-03-21 NOTE — Progress Notes (Signed)
SATURATION QUALIFICATIONS: (This note is used to comply with regulatory documentation for home oxygen)  Patient Saturations on Room Air at Rest = 82%  Patient Saturations on Room Air while Ambulating = 80%  Patient Saturations on 3 Liters of oxygen while Ambulating = 93%  Please briefly explain why patient needs home oxygen:

## 2021-03-21 NOTE — Discharge Summary (Signed)
Physician Discharge Summary  Manuel Le [id number] DOB: 20-Nov-1926 DOA: 03/18/2021  PCP: Celene Squibb, MD  Admit date: 03/18/2021  Discharge date: 03/21/2021  Admitted From:Home  Disposition:  Home  Recommendations for Outpatient Follow-up:  Follow up with PCP in 1-2 weeks Continue on prednisone as prescribed for 5 more days  Continues to have severe weakness and limitations, but family is not ready for full transition to hospice care and does not want SNF.  We will plan for home health PT/RN as well as outpatient palliative and hospital bed for now.  He appears to have very poor prognosis short-term. Continue other home medications as prior  Home Health:Yes, PT/RN with outpatient palliative evaluation  Equipment/Devices:Home 2L [place] and hospital bed  Discharge Condition:Stable  CODE STATUS: DNR  Diet recommendation: Heart Healthy  Brief/Interim Summary:  Manuel Le is a 87 y.o. male with medical history significant for coronary artery disease, hypertension, depression, chronic diastolic dysfunction CHF who was brought into the ER by EMS for evaluation of weakness and inability to ambulate for couple of days. At baseline patient ambulates with a rolling walker but over the last couple of days he has been very weak and unable to ambulate and was noted to be covered in feces and urine when he arrived to ER.  Patient was admitted with acute hypoxemic respiratory failure secondary to COVID-19 pneumonia.  He continues to have ongoing hypoxemia requiring 2 L nasal cannula oxygen.  He appeared to be needing SNF/rehab, but his healthcare power of attorney refuses this option and states that it would be best for him to come home with RN/PT and agrees that palliative evaluation outpatient would be best with likely transition to home hospice in the very near future.  He requires a 3 person assist for getting out of bed.  He has had no other acute concerns or events noted throughout the course  of this admission and is stable for discharge.  Discharge Diagnoses:  Principal Problem:   Pneumonia due to COVID-19 virus Active Problems:   Atrial fibrillation (HCC)   HTN (hypertension)   CAD (coronary artery disease)   Acute respiratory failure due to COVID-19 (HCC)   Chronic diastolic CHF (congestive heart failure) (HCC)   Generalized weakness  Principal discharge diagnosis: Generalized weakness with acute hypoxemic respiratory failure secondary to COVID-19 pneumonia.  Discharge Instructions  Discharge Instructions     Diet - low sodium heart healthy   Complete by: As directed    Increase activity slowly   Complete by: As directed       Allergies as of 03/21/2021   No Known Allergies      Medication List     TAKE these medications    albuterol 108 (90 Base) MCG/ACT inhaler Commonly known as: VENTOLIN HFA Inhale 2 puffs into the lungs every 4 (four) hours as needed for shortness of breath or wheezing.   ascorbic acid 500 MG tablet Commonly known as: VITAMIN C Take 500 mg by mouth daily.   aspirin 81 MG tablet Take 1 tablet (81 mg total) by mouth daily.   carvedilol 3.125 MG tablet Commonly known as: COREG Take 3.125 mg by mouth 2 (two) times daily.   Centrum Silver Adult 50+ Tabs Take 1 tablet by mouth every morning.   ferrous sulfate 325 (65 FE) MG EC tablet Take 325 mg by mouth daily with breakfast.   finasteride 5 MG tablet Commonly known as: PROSCAR Take 5 mg by mouth daily.   Fish  Oil 1000 MG Caps Take 1 capsule by mouth daily.   furosemide 20 MG tablet Commonly known as: LASIX Take 20 mg by mouth See admin instructions.   guaiFENesin-dextromethorphan 100-10 MG/5ML syrup Commonly known as: ROBITUSSIN DM Take 10 mLs by mouth every 4 (four) hours as needed for cough.   losartan 100 MG tablet Commonly known as: COZAAR Take 100 mg by mouth daily.   melatonin 5 MG Tabs Take 5 mg by mouth at bedtime. Take with 3 mg   Melatonin 3 MG  Caps Take 1 capsule by mouth at bedtime. Take with 5 mg   nitroGLYCERIN 0.4 MG SL tablet Commonly known as: NITROSTAT Place 1 tablet (0.4 mg total) under the tongue every 5 (five) minutes as needed for chest pain.   Osteo Bi-Flex Adv Joint Shield Tabs Take 1 tablet by mouth daily.   potassium chloride 10 MEQ tablet Commonly known as: KLOR-CON Take 10 mEq by mouth See admin instructions.   predniSONE 20 MG tablet Commonly known as: DELTASONE Take 2 tablets (40 mg total) by mouth daily for 5 days.   sertraline 50 MG tablet Commonly known as: ZOLOFT Take 50 mg by mouth daily.   vitamin B-12 1000 MCG tablet Commonly known as: CYANOCOBALAMIN 1 tablet (1,000 mcg total) by Per NG tube route daily. What changed: how to take this   Vitamin D3 25 MCG (1000 UT) Caps Take 1 capsule by mouth daily.               Durable Medical Equipment  (From admission, onward)           Start     Ordered   03/21/21 1024  For home use only DME oxygen  Once       Question Answer Comment  Length of Need Lifetime   Mode or (Route) Nasal cannula   Liters per Minute 2   Frequency Continuous (stationary and portable oxygen unit needed)   Oxygen conserving device Yes   Oxygen delivery system Gas      03/21/21 1024   03/19/21 1231  For home use only DME Hospital bed  Once       Question Answer Comment  Length of Need 12 Months   Bed type Semi-electric      03/19/21 1231            Follow-up Information     Celene Squibb, MD .   Specialty: Internal Medicine Contact information: [place] [address] [phone]                No Known Allergies  Consultations: None   Procedures/Studies: CT Angio Chest PE W and/or Wo Contrast  Result Date: 03/18/2021 CLINICAL DATA:  87 year old male with shortness of breath, COVID positive. EXAM: CT ANGIOGRAPHY CHEST WITH CONTRAST TECHNIQUE: Multidetector CT imaging of the chest was performed using the  standard protocol during bolus administration of intravenous contrast. Multiplanar CT image reconstructions and MIPs were obtained to evaluate the vascular anatomy. CONTRAST:  100 mL Omnipaque 350, intravenous COMPARISON:  09/25/2017 FINDINGS: Cardiovascular: Satisfactory opacification of the pulmonary arteries to the segmental level. No evidence of pulmonary embolism. Normal heart size. No pericardial effusion. Coronary, aortic valvular, mitral annular, and aortic atherosclerotic calcifications are noted. Mediastinum/Nodes: No enlarged mediastinal, hilar, or axillary lymph nodes. Thyroid gland, trachea, and esophagus demonstrate no significant findings. Lungs/Pleura: Similar appearing bibasilar peribronchovascular thickening with associated dependent atelectasis and scattered consolidative opacities. No new consolidative opacities, significant pleural effusion,  pneumothorax. Mild emphysematous changes are noted. Mild Upper Abdomen: The visualized upper abdomen is within normal limits. Musculoskeletal: No chest wall abnormality. No acute or significant osseous findings. Review of the MIP images confirms the above findings. IMPRESSION: Vascular: 1. No evidence of pulmonary embolism. 2. Coronary and aortic atherosclerosis (ICD10-I70.0). Non-Vascular: 1. Bibasilar bronchial wall thickening and associated atelectasis, similar to 2019 comparison, likely indicative of sequela of chronic aspiration. Superimposed pneumonia could appear similarly. 2.  Mild emphysema (ICD10-J43.9). Ruthann Cancer, MD [facility] Radiology Electronically Signed   By: Ruthann Cancer MD   On: 03/18/2021 13:44   DG Chest Port 1 View  Result Date: 03/18/2021 CLINICAL DATA:  87 year old male with shortness of breath for 1 week. Positive COVID-19. EXAM: PORTABLE CHEST 1 VIEW COMPARISON:  Chest radiographs 07/27/2019 and earlier. FINDINGS: Portable AP semi upright view at 1039 hours. Lower lung  volumes. Indistinct increased lung base opacity greater on the left. Stable cardiac size and mediastinal contours. No pneumothorax or pulmonary edema. No definite pleural effusion. No other confluent pulmonary opacity. Visualized tracheal air column is within normal limits. Paucity of bowel gas in the upper abdomen. No acute osseous abnormality identified. IMPRESSION: Low lung volumes with nonspecific bibasilar opacity greater on the left, possibly atelectasis but left lung base pneumonia not excluded. PA and lateral views would be helpful when feasible. Electronically Signed   By: Genevie Ann M.D.   On: 03/18/2021 11:02     Discharge Exam: Vitals:   03/21/21 0620 03/21/21 0720  BP: (!) 182/85   Pulse: 63   Resp: 19   Temp: 97.6 F (36.4 C)   SpO2: 92% 93%   Vitals:   03/20/21 1654 03/20/21 2054 03/21/21 0620 03/21/21 0720  BP: (!) 163/80 (!) 179/90 (!) 182/85   Pulse: 71 77 63   Resp:  19 19   Temp: 97.6 F (36.4 C) 98.6 F (37 C) 97.6 F (36.4 C)   TempSrc: Oral  Oral   SpO2: 97% 93% 92% 93%    General: Pt is alert, awake, not in acute distress Cardiovascular: RRR, S1/S2 +, no rubs, no gallops Respiratory: CTA bilaterally, no wheezing, no rhonchi, currently on 2 L nasal cannula oxygen Abdominal: Soft, NT, ND, bowel sounds + Extremities: no edema, no cyanosis    The results of significant diagnostics from this hospitalization (including imaging, microbiology, ancillary and laboratory) are listed below for reference.     Microbiology: Recent Results (from the past 240 hour(s))  Resp Panel by RT-PCR (Flu A&B, Covid) Nasopharyngeal Swab     Status: Abnormal   Collection Time: 03/18/21 12:39 PM   Specimen: Nasopharyngeal Swab; Nasopharyngeal(NP) swabs in vial transport medium  Result Value Ref Range Status   SARS Coronavirus 2 by RT PCR POSITIVE (A) NEGATIVE Final    Comment: RESULT CALLED TO, READ BACK BY AND VERIFIED WITH:  DR [id number] 15007/29/22 BY STEPHTR (NOTE) SARS-CoV-2  target nucleic acids are DETECTED.  The SARS-CoV-2 RNA is generally detectable in upper respiratory specimens during the acute phase of infection. Positive results are indicative of the presence of the identified virus, but do not rule out bacterial infection or co-infection with other pathogens not detected by the test. Clinical correlation with patient history and other diagnostic information is necessary to determine patient infection status. The expected result is Negative.  Fact Sheet for Patients: EntrepreneurPulse.com.au  Fact Sheet for Healthcare Providers: IncredibleEmployment.be  This test is not yet approved or cleared by the [place] and  has been authorized for detection and/or diagnosis of SARS-CoV-2 by FDA under an Emergency Use Authorization (EUA).  This EUA will remain in effect (meaning this test can b e used) for the duration of  the COVID-19 declaration under Section 564(b)(1) of the Act, 21 U.S.C. section 360bbb-3(b)(1), unless the authorization is terminated or revoked sooner.     Influenza A by PCR NEGATIVE NEGATIVE Final   Influenza B by PCR NEGATIVE NEGATIVE Final    Comment: (NOTE) The Xpert Xpress SARS-CoV-2/FLU/RSV plus assay is intended as an aid in the diagnosis of influenza from Nasopharyngeal swab specimens and should not be used as a sole basis for treatment. Nasal washings and aspirates are unacceptable for Xpert Xpress SARS-CoV-2/FLU/RSV testing.  Fact Sheet for Patients: EntrepreneurPulse.com.au  Fact Sheet for Healthcare Providers: IncredibleEmployment.be  This test is not yet approved or cleared by the [place] FDA and has been authorized for detection and/or diagnosis of SARS-CoV-2 by FDA under an Emergency Use Authorization (EUA). This EUA will remain in effect (meaning this test can be used) for the duration of the COVID-19 declaration under Section  564(b)(1) of the Act, 21 U.S.C. section 360bbb-3(b)(1), unless the authorization is terminated or revoked.  Performed at [facility], [address] [place], [place] [address]   Blood Culture (routine x 2)     Status: None (Preliminary result)   Collection Time: 03/18/21  4:02 PM   Specimen: Right Antecubital; Blood  Result Value Ref Range Status   Specimen Description RIGHT ANTECUBITAL  Final   Special Requests   Final    BOTTLES DRAWN AEROBIC AND ANAEROBIC Blood Culture adequate volume   Culture   Final    NO GROWTH 3 DAYS Performed at [facility], [address], [place], [place] [address]    Report Status PENDING  Incomplete  Blood Culture (routine x 2)     Status: None (Preliminary result)   Collection Time: 03/18/21  4:03 PM   Specimen: BLOOD RIGHT ARM  Result Value Ref Range Status   Specimen Description BLOOD RIGHT ARM  Final   Special Requests   Final    BOTTLES DRAWN AEROBIC AND ANAEROBIC Blood Culture adequate volume   Culture   Final    NO GROWTH 3 DAYS Performed at [facility], [address], [place], [place] [address]    Report Status PENDING  Incomplete     Labs: BNP (last 3 results) No results for input(s): BNP in the last 8760 hours. Basic Metabolic Panel: Recent Labs  Lab 03/18/21 1034 03/19/21 0608 03/20/21 0634 03/21/21 0630  NA 139 140 137 140  K 3.4* 3.8 3.4* 3.7  CL 104 105 105 105  CO2 '27 27 24 26  '$ GLUCOSE 127* 154* 161* 158*  BUN 19 20 24* 23  CREATININE 0.76 0.71 0.65 0.66  CALCIUM 8.9 8.8* 8.6* 8.8*  MG  --  2.2 2.2 2.2  PHOS  --  3.3 2.8 3.0   Liver Function Tests: Recent Labs  Lab 03/18/21 1034 03/19/21 0608 03/20/21 0634 03/21/21 0630  AST '31 24 31 29  '$ ALT '20 17 23 26  '$ ALKPHOS 46 42 44 42  BILITOT 1.0 0.9 0.9 0.7  PROT 7.5 6.8 6.9 7.0  ALBUMIN 3.6 3.2* 3.3* 3.3*   No results for input(s): LIPASE, AMYLASE in the last 168 hours. No results for input(s): AMMONIA in the last 168 hours. CBC: Recent Labs  Lab  03/18/21 1034 03/19/21 0608 03/20/21 0634 03/21/21 0630  WBC 10.0 5.0 7.8 11.5*  NEUTROABS 8.0* 4.3 6.8 10.3*  HGB 14.7 13.8 14.0 15.0  HCT 44.4 41.3 41.4 44.8  MCV 93.7 94.1 90.6 92.4  PLT 128* 142* 175 205   Cardiac Enzymes: No results for input(s): CKTOTAL, CKMB, CKMBINDEX, TROPONINI in the last 168 hours. BNP: Invalid input(s): POCBNP CBG: No results for input(s): GLUCAP in the last 168 hours. D-Dimer Recent Labs    03/20/21 0634 03/21/21 0630  DDIMER 0.93* 0.72*   Hgb A1c No results for input(s): HGBA1C in the last 72 hours. Lipid Profile Recent Labs    03/18/21 1552  TRIG 103   Thyroid function studies No results for input(s): TSH, T4TOTAL, T3FREE, THYROIDAB in the last 72 hours.  Invalid input(s): FREET3 Anemia work up Recent Labs    03/20/21 0634 03/21/21 0630  FERRITIN 121 112   Urinalysis    Component Value Date/Time   COLORURINE YELLOW 10/20/2020 [place] 10/20/2020 1218   LABSPEC 1.010 10/20/2020 1218   PHURINE 5.5 10/20/2020 1218   GLUCOSEU NEGATIVE 10/20/2020 [place] (A) 10/20/2020 1218   BILIRUBINUR NEGATIVE 10/20/2020 [place] 10/20/2020 1218   PROTEINUR NEGATIVE 10/20/2020 1218   UROBILINOGEN 1.0 11/27/2013 2214   NITRITE NEGATIVE 10/20/2020 1218   LEUKOCYTESUR TRACE (A) 10/20/2020 1218   Sepsis Labs Invalid input(s): PROCALCITONIN,  WBC,  LACTICIDVEN Microbiology Recent Results (from the past 240 hour(s))  Resp Panel by RT-PCR (Flu A&B, Covid) Nasopharyngeal Swab     Status: Abnormal   Collection Time: 03/18/21 12:39 PM   Specimen: Nasopharyngeal Swab; Nasopharyngeal(NP) swabs in vial transport medium  Result Value Ref Range Status   SARS Coronavirus 2 by RT PCR POSITIVE (A) NEGATIVE Final    Comment: RESULT CALLED TO, READ BACK BY AND VERIFIED WITH:  DR [id number] 15007/29/22 BY STEPHTR (NOTE) SARS-CoV-2 target nucleic acids are DETECTED.  The SARS-CoV-2 RNA is generally detectable  in upper respiratory specimens during the acute phase of infection. Positive results are indicative of the presence of the identified virus, but do not rule out bacterial infection or co-infection with other pathogens not detected by the test. Clinical correlation with patient history and other diagnostic information is necessary to determine patient infection status. The expected result is Negative.  Fact Sheet for Patients: EntrepreneurPulse.com.au  Fact Sheet for Healthcare Providers: IncredibleEmployment.be  This test is not yet approved or cleared by the [place] FDA and  has been authorized for detection and/or diagnosis of SARS-CoV-2 by FDA under an Emergency Use Authorization (EUA).  This EUA will remain in effect (meaning this test can b e used) for the duration of  the COVID-19 declaration under Section 564(b)(1) of the Act, 21 U.S.C. section 360bbb-3(b)(1), unless the authorization is terminated or revoked sooner.     Influenza A by PCR NEGATIVE NEGATIVE Final   Influenza B by PCR NEGATIVE NEGATIVE Final    Comment: (NOTE) The Xpert Xpress SARS-CoV-2/FLU/RSV plus assay is intended as an aid in the diagnosis of influenza from Nasopharyngeal swab specimens and should not be used as a sole basis for treatment. Nasal washings and aspirates are unacceptable for Xpert Xpress SARS-CoV-2/FLU/RSV testing.  Fact Sheet for Patients: EntrepreneurPulse.com.au  Fact Sheet for Healthcare Providers: IncredibleEmployment.be  This test is not yet approved or cleared by the [place] FDA and has been authorized for detection and/or diagnosis of SARS-CoV-2 by FDA under an Emergency Use Authorization (EUA). This EUA will remain in effect (meaning this test can be used) for the duration of  the COVID-19 declaration under Section 564(b)(1) of the Act, 21 U.S.C. section 360bbb-3(b)(1), unless the authorization  is terminated or revoked.  Performed at [facility], [address], [place], [place] [address]   Blood Culture (routine x 2)     Status: None (Preliminary result)   Collection Time: 03/18/21  4:02 PM   Specimen: Right Antecubital; Blood  Result Value Ref Range Status   Specimen Description RIGHT ANTECUBITAL  Final   Special Requests   Final    BOTTLES DRAWN AEROBIC AND ANAEROBIC Blood Culture adequate volume   Culture   Final    NO GROWTH 3 DAYS Performed at [facility], [address], [place], [place] [address]    Report Status PENDING  Incomplete  Blood Culture (routine x 2)     Status: None (Preliminary result)   Collection Time: 03/18/21  4:03 PM   Specimen: BLOOD RIGHT ARM  Result Value Ref Range Status   Specimen Description BLOOD RIGHT ARM  Final   Special Requests   Final    BOTTLES DRAWN AEROBIC AND ANAEROBIC Blood Culture adequate volume   Culture   Final    NO GROWTH 3 DAYS Performed at [facility], [address], [place],  [address]    Report Status PENDING  Incomplete     Time coordinating discharge: 35 minutes  SIGNED:   Rodena Goldmann, DO [facility] 03/21/2021, 10:31 AM  If 7PM-7AM, please contact night-coverage [email or web address]

## 2021-03-22 LAB — CBC WITH DIFFERENTIAL/PLATELET
Abs Immature Granulocytes: 0.06 10*3/uL (ref 0.00–0.07)
Basophils Absolute: 0 10*3/uL (ref 0.0–0.1)
Basophils Relative: 0 %
Eosinophils Absolute: 0 10*3/uL (ref 0.0–0.5)
Eosinophils Relative: 0 %
HCT: 44.1 % (ref 39.0–52.0)
Hemoglobin: 14.9 g/dL (ref 13.0–17.0)
Immature Granulocytes: 1 %
Lymphocytes Relative: 5 %
Lymphs Abs: 0.6 10*3/uL — ABNORMAL LOW (ref 0.7–4.0)
MCH: 31.3 pg (ref 26.0–34.0)
MCHC: 33.8 g/dL (ref 30.0–36.0)
MCV: 92.6 fL (ref 80.0–100.0)
Monocytes Absolute: 0.6 10*3/uL (ref 0.1–1.0)
Monocytes Relative: 6 %
Neutro Abs: 9.7 10*3/uL — ABNORMAL HIGH (ref 1.7–7.7)
Neutrophils Relative %: 88 %
Platelets: 191 10*3/uL (ref 150–400)
RBC: 4.76 MIL/uL (ref 4.22–5.81)
RDW: 13 % (ref 11.5–15.5)
WBC: 10.9 10*3/uL — ABNORMAL HIGH (ref 4.0–10.5)
nRBC: 0 % (ref 0.0–0.2)

## 2021-03-22 LAB — COMPREHENSIVE METABOLIC PANEL
ALT: 37 U/L (ref 0–44)
AST: 33 U/L (ref 15–41)
Albumin: 3.2 g/dL — ABNORMAL LOW (ref 3.5–5.0)
Alkaline Phosphatase: 44 U/L (ref 38–126)
Anion gap: 6 (ref 5–15)
BUN: 26 mg/dL — ABNORMAL HIGH (ref 8–23)
CO2: 28 mmol/L (ref 22–32)
Calcium: 8.7 mg/dL — ABNORMAL LOW (ref 8.9–10.3)
Chloride: 104 mmol/L (ref 98–111)
Creatinine, Ser: 0.61 mg/dL (ref 0.61–1.24)
GFR, Estimated: >60 mL/min (ref >60)
Glucose, Bld: 174 mg/dL — ABNORMAL HIGH (ref 70–99)
Potassium: 4.1 mmol/L (ref 3.5–5.1)
Sodium: 138 mmol/L (ref 135–145)
Total Bilirubin: 0.9 mg/dL (ref 0.3–1.2)
Total Protein: 6.9 g/dL (ref 6.5–8.1)

## 2021-03-22 LAB — C-REACTIVE PROTEIN: CRP: 5.9 mg/dL — ABNORMAL HIGH (ref <1.0)

## 2021-03-22 LAB — PHOSPHORUS: Phosphorus: 3.2 mg/dL (ref 2.5–4.6)

## 2021-03-22 LAB — D-DIMER, QUANTITATIVE: D-Dimer, Quant: 0.55 ug/mL-FEU — ABNORMAL HIGH (ref 0.00–0.50)

## 2021-03-22 LAB — MAGNESIUM: Magnesium: 2.3 mg/dL (ref 1.7–2.4)

## 2021-03-22 LAB — FERRITIN: Ferritin: 102 ng/mL (ref 24–336)

## 2021-03-22 MED ORDER — ALBUTEROL SULFATE HFA 108 (90 BASE) MCG/ACT IN AERS
2.0000 | INHALATION_SPRAY | Freq: Four times a day (QID) | RESPIRATORY_TRACT | Status: DC
  Administered 2021-03-22: 2 via RESPIRATORY_TRACT

## 2021-03-22 NOTE — Progress Notes (Signed)
Patient seen and evaluated this a.m. with no other acute events noted overnight.  Please refer to discharge summary dictated 8/1 for full details of hospitalization.  He is awaiting EMS transport to go back home.  Total care time: 15 minutes.

## 2021-03-22 NOTE — TOC Transition Note (Signed)
Transition of Care [facility]) - CM/SW Discharge Note   Patient Details  Name: Manuel Le MRN: [id number] Date of Birth: 1927-07-30  [facility] CM/SW Contact:  Ihor Gully, LCSW Phone Number: 03/22/2021, 11:03 AM   Clinical Narrative:    TOC contacted EMS for transport. EMS advised that patient was already in [facility] and they would pick him up as soon as a truck became available. They were unsure as to what time a truck would be available    Final next level of care: Home/Self Care Barriers to Discharge: Barriers Resolved   Patient Goals and CMS Choice Patient states their goals for this hospitalization and ongoing recovery are:: Return home with [facility] CMS Medicare.gov Compare Post Acute Care list provided to:: Patient Choice offered to / list presented to : Patient  Discharge Placement                Patient to be transferred to facility by: EMS Name of family member notified: POA - Charles Patient and family notified of of transfer: 03/21/21  Discharge Plan and Services                DME Arranged: Hospital bed DME Agency: [facility] Date DME Agency Contacted: 03/19/21 Time DME Agency Contacted: [phone] Representative spoke with at DME Agency: jasmine            Social Determinants of Health ([place]) Interventions     Readmission Risk Interventions Readmission Risk Prevention Plan 03/21/2021  Transportation Screening Complete  PCP or Specialist Appt within 3-5 Days Complete  HRI or [place] Complete  Social Work Consult for [place] Planning/Counseling Complete  Palliative Care Screening Not Applicable  Medication Review Press photographer) Complete  Some recent data might be hidden

## 2021-03-23 DIAGNOSIS — R531 Weakness: Secondary | ICD-10-CM | POA: Diagnosis not present

## 2021-03-23 DIAGNOSIS — I5032 Chronic diastolic (congestive) heart failure: Secondary | ICD-10-CM | POA: Diagnosis not present

## 2021-03-23 DIAGNOSIS — J9600 Acute respiratory failure, unspecified whether with hypoxia or hypercapnia: Secondary | ICD-10-CM | POA: Diagnosis not present

## 2021-03-23 LAB — CULTURE, BLOOD (ROUTINE X 2)
Culture: NO GROWTH
Culture: NO GROWTH
Special Requests: ADEQUATE
Special Requests: ADEQUATE

## 2021-03-26 ENCOUNTER — Emergency Department (HOSPITAL_COMMUNITY): Payer: PPO

## 2021-03-26 ENCOUNTER — Other Ambulatory Visit: Payer: Self-pay

## 2021-03-26 ENCOUNTER — Encounter (HOSPITAL_COMMUNITY): Payer: Self-pay

## 2021-03-26 ENCOUNTER — Inpatient Hospital Stay (HOSPITAL_COMMUNITY)
Admission: EM | Admit: 2021-03-26 | Discharge: 2021-04-07 | DRG: 177 | Disposition: A | Discharge status: Skilled Nursing Facility | Payer: PPO | Attending: Family Medicine | Admitting: Family Medicine

## 2021-03-26 DIAGNOSIS — G4733 Obstructive sleep apnea (adult) (pediatric): Secondary | ICD-10-CM | POA: Diagnosis not present

## 2021-03-26 DIAGNOSIS — R0689 Other abnormalities of breathing: Secondary | ICD-10-CM

## 2021-03-26 DIAGNOSIS — Z9981 Dependence on supplemental oxygen: Secondary | ICD-10-CM | POA: Diagnosis not present

## 2021-03-26 DIAGNOSIS — M1990 Unspecified osteoarthritis, unspecified site: Secondary | ICD-10-CM | POA: Diagnosis not present

## 2021-03-26 DIAGNOSIS — I2510 Atherosclerotic heart disease of native coronary artery without angina pectoris: Secondary | ICD-10-CM | POA: Diagnosis present

## 2021-03-26 DIAGNOSIS — R0602 Shortness of breath: Secondary | ICD-10-CM

## 2021-03-26 DIAGNOSIS — Z28310 Unvaccinated for covid-19: Secondary | ICD-10-CM | POA: Diagnosis not present

## 2021-03-26 DIAGNOSIS — J9621 Acute and chronic respiratory failure with hypoxia: Secondary | ICD-10-CM | POA: Diagnosis not present

## 2021-03-26 DIAGNOSIS — I10 Essential (primary) hypertension: Secondary | ICD-10-CM | POA: Diagnosis not present

## 2021-03-26 DIAGNOSIS — I5032 Chronic diastolic (congestive) heart failure: Secondary | ICD-10-CM | POA: Diagnosis present

## 2021-03-26 DIAGNOSIS — Z66 Do not resuscitate: Secondary | ICD-10-CM | POA: Diagnosis not present

## 2021-03-26 DIAGNOSIS — I2699 Other pulmonary embolism without acute cor pulmonale: Secondary | ICD-10-CM | POA: Diagnosis not present

## 2021-03-26 DIAGNOSIS — Z515 Encounter for palliative care: Secondary | ICD-10-CM | POA: Diagnosis not present

## 2021-03-26 DIAGNOSIS — R0902 Hypoxemia: Secondary | ICD-10-CM | POA: Diagnosis not present

## 2021-03-26 DIAGNOSIS — J9601 Acute respiratory failure with hypoxia: Secondary | ICD-10-CM | POA: Diagnosis not present

## 2021-03-26 DIAGNOSIS — Z7189 Other specified counseling: Secondary | ICD-10-CM | POA: Diagnosis not present

## 2021-03-26 DIAGNOSIS — U071 COVID-19: Secondary | ICD-10-CM | POA: Diagnosis present

## 2021-03-26 DIAGNOSIS — J069 Acute upper respiratory infection, unspecified: Secondary | ICD-10-CM

## 2021-03-26 DIAGNOSIS — I482 Chronic atrial fibrillation, unspecified: Secondary | ICD-10-CM | POA: Diagnosis not present

## 2021-03-26 DIAGNOSIS — K219 Gastro-esophageal reflux disease without esophagitis: Secondary | ICD-10-CM | POA: Diagnosis present

## 2021-03-26 DIAGNOSIS — I110 Hypertensive heart disease with heart failure: Secondary | ICD-10-CM | POA: Diagnosis present

## 2021-03-26 DIAGNOSIS — I959 Hypotension, unspecified: Secondary | ICD-10-CM | POA: Diagnosis not present

## 2021-03-26 DIAGNOSIS — Z8551 Personal history of malignant neoplasm of bladder: Secondary | ICD-10-CM

## 2021-03-26 DIAGNOSIS — F32A Depression, unspecified: Secondary | ICD-10-CM | POA: Diagnosis not present

## 2021-03-26 DIAGNOSIS — Z743 Need for continuous supervision: Secondary | ICD-10-CM | POA: Diagnosis not present

## 2021-03-26 DIAGNOSIS — D6959 Other secondary thrombocytopenia: Secondary | ICD-10-CM | POA: Diagnosis not present

## 2021-03-26 DIAGNOSIS — I4892 Unspecified atrial flutter: Secondary | ICD-10-CM | POA: Diagnosis not present

## 2021-03-26 DIAGNOSIS — J1282 Pneumonia due to coronavirus disease 2019: Secondary | ICD-10-CM | POA: Diagnosis not present

## 2021-03-26 DIAGNOSIS — I480 Paroxysmal atrial fibrillation: Secondary | ICD-10-CM | POA: Diagnosis not present

## 2021-03-26 DIAGNOSIS — N400 Enlarged prostate without lower urinary tract symptoms: Secondary | ICD-10-CM | POA: Diagnosis present

## 2021-03-26 DIAGNOSIS — D509 Iron deficiency anemia, unspecified: Secondary | ICD-10-CM | POA: Diagnosis not present

## 2021-03-26 DIAGNOSIS — J1289 Other viral pneumonia: Secondary | ICD-10-CM | POA: Diagnosis not present

## 2021-03-26 DIAGNOSIS — T380X5A Adverse effect of glucocorticoids and synthetic analogues, initial encounter: Secondary | ICD-10-CM | POA: Diagnosis not present

## 2021-03-26 DIAGNOSIS — Z7401 Bed confinement status: Secondary | ICD-10-CM | POA: Diagnosis not present

## 2021-03-26 DIAGNOSIS — R0600 Dyspnea, unspecified: Secondary | ICD-10-CM

## 2021-03-26 DIAGNOSIS — Z955 Presence of coronary angioplasty implant and graft: Secondary | ICD-10-CM

## 2021-03-26 DIAGNOSIS — R739 Hyperglycemia, unspecified: Secondary | ICD-10-CM | POA: Diagnosis not present

## 2021-03-26 DIAGNOSIS — Z7982 Long term (current) use of aspirin: Secondary | ICD-10-CM | POA: Diagnosis not present

## 2021-03-26 DIAGNOSIS — I4891 Unspecified atrial fibrillation: Secondary | ICD-10-CM | POA: Diagnosis present

## 2021-03-26 DIAGNOSIS — Z8615 Personal history of latent tuberculosis infection: Secondary | ICD-10-CM | POA: Diagnosis not present

## 2021-03-26 DIAGNOSIS — Z87891 Personal history of nicotine dependence: Secondary | ICD-10-CM | POA: Diagnosis not present

## 2021-03-26 DIAGNOSIS — I517 Cardiomegaly: Secondary | ICD-10-CM | POA: Diagnosis not present

## 2021-03-26 DIAGNOSIS — Z85828 Personal history of other malignant neoplasm of skin: Secondary | ICD-10-CM

## 2021-03-26 LAB — CBC WITH DIFFERENTIAL/PLATELET
Abs Immature Granulocytes: 0.1 10*3/uL — ABNORMAL HIGH (ref 0.00–0.07)
Basophils Absolute: 0 10*3/uL (ref 0.0–0.1)
Basophils Relative: 0 %
Eosinophils Absolute: 0 10*3/uL (ref 0.0–0.5)
Eosinophils Relative: 0 %
HCT: 41.5 % (ref 39.0–52.0)
Hemoglobin: 13.8 g/dL (ref 13.0–17.0)
Immature Granulocytes: 1 %
Lymphocytes Relative: 1 %
Lymphs Abs: 0.2 10*3/uL — ABNORMAL LOW (ref 0.7–4.0)
MCH: 31.1 pg (ref 26.0–34.0)
MCHC: 33.3 g/dL (ref 30.0–36.0)
MCV: 93.5 fL (ref 80.0–100.0)
Monocytes Absolute: 0.6 10*3/uL (ref 0.1–1.0)
Monocytes Relative: 3 %
Neutro Abs: 15.9 10*3/uL — ABNORMAL HIGH (ref 1.7–7.7)
Neutrophils Relative %: 95 %
Platelets: 237 10*3/uL (ref 150–400)
RBC: 4.44 MIL/uL (ref 4.22–5.81)
RDW: 13.3 % (ref 11.5–15.5)
WBC: 16.8 10*3/uL — ABNORMAL HIGH (ref 4.0–10.5)
nRBC: 0 % (ref 0.0–0.2)

## 2021-03-26 LAB — BRAIN NATRIURETIC PEPTIDE: B Natriuretic Peptide: 68 pg/mL (ref 0.0–100.0)

## 2021-03-26 LAB — COMPREHENSIVE METABOLIC PANEL
ALT: 31 U/L (ref 0–44)
AST: 37 U/L (ref 15–41)
Albumin: 3.1 g/dL — ABNORMAL LOW (ref 3.5–5.0)
Alkaline Phosphatase: 49 U/L (ref 38–126)
Anion gap: 8 (ref 5–15)
BUN: 25 mg/dL — ABNORMAL HIGH (ref 8–23)
CO2: 22 mmol/L (ref 22–32)
Calcium: 8.4 mg/dL — ABNORMAL LOW (ref 8.9–10.3)
Chloride: 104 mmol/L (ref 98–111)
Creatinine, Ser: 0.87 mg/dL (ref 0.61–1.24)
GFR, Estimated: >60 mL/min (ref >60)
Glucose, Bld: 299 mg/dL — ABNORMAL HIGH (ref 70–99)
Potassium: 4 mmol/L (ref 3.5–5.1)
Sodium: 134 mmol/L — ABNORMAL LOW (ref 135–145)
Total Bilirubin: 0.8 mg/dL (ref 0.3–1.2)
Total Protein: 6.8 g/dL (ref 6.5–8.1)

## 2021-03-26 LAB — TROPONIN I (HIGH SENSITIVITY)
Troponin I (High Sensitivity): 11 ng/L (ref <18)
Troponin I (High Sensitivity): 10 ng/L (ref <18)

## 2021-03-26 LAB — D-DIMER, QUANTITATIVE: D-Dimer, Quant: 1.55 ug/mL-FEU — ABNORMAL HIGH (ref 0.00–0.50)

## 2021-03-26 LAB — CBG MONITORING, ED: Glucose-Capillary: 217 mg/dL — ABNORMAL HIGH (ref 70–99)

## 2021-03-26 MED ORDER — GUAIFENESIN ER 600 MG PO TB12
600.0000 mg | ORAL_TABLET | Freq: Two times a day (BID) | ORAL | Status: DC
  Administered 2021-03-26 – 2021-04-07 (×24): 600 mg via ORAL
  Filled 2021-03-26 (×24): qty 1

## 2021-03-26 MED ORDER — SODIUM CHLORIDE 0.9% FLUSH
3.0000 mL | Freq: Two times a day (BID) | INTRAVENOUS | Status: DC
  Administered 2021-03-26 – 2021-04-07 (×18): 3 mL via INTRAVENOUS

## 2021-03-26 MED ORDER — ASCORBIC ACID 500 MG PO TABS
500.0000 mg | ORAL_TABLET | Freq: Every day | ORAL | Status: DC
  Administered 2021-03-27 – 2021-04-07 (×12): 500 mg via ORAL
  Filled 2021-03-26 (×12): qty 1

## 2021-03-26 MED ORDER — BARICITINIB 2 MG PO TABS
4.0000 mg | ORAL_TABLET | Freq: Every day | ORAL | Status: DC
  Administered 2021-03-27: 4 mg via ORAL
  Filled 2021-03-26: qty 2

## 2021-03-26 MED ORDER — ASPIRIN 81 MG PO CHEW
81.0000 mg | CHEWABLE_TABLET | Freq: Every day | ORAL | Status: DC
  Administered 2021-03-27 – 2021-04-07 (×12): 81 mg via ORAL
  Filled 2021-03-26 (×13): qty 1

## 2021-03-26 MED ORDER — ZINC SULFATE 220 (50 ZN) MG PO CAPS
220.0000 mg | ORAL_CAPSULE | Freq: Every day | ORAL | Status: DC
  Administered 2021-03-27 – 2021-04-07 (×12): 220 mg via ORAL
  Filled 2021-03-26 (×12): qty 1

## 2021-03-26 MED ORDER — ACETAMINOPHEN 325 MG PO TABS: 650.0000 mg | ORAL_TABLET | Freq: Four times a day (QID) | ORAL | Status: DC | PRN

## 2021-03-26 MED ORDER — ALBUTEROL SULFATE HFA 108 (90 BASE) MCG/ACT IN AERS
2.0000 | INHALATION_SPRAY | RESPIRATORY_TRACT | Status: DC | PRN
  Administered 2021-03-27 – 2021-04-04 (×7): 2 via RESPIRATORY_TRACT

## 2021-03-26 MED ORDER — ACETAMINOPHEN 650 MG RE SUPP: 650.0000 mg | Freq: Four times a day (QID) | RECTAL | Status: DC | PRN

## 2021-03-26 MED ORDER — INSULIN ASPART 100 UNIT/ML IJ SOLN
0.0000 [IU] | Freq: Three times a day (TID) | INTRAMUSCULAR | Status: DC
  Administered 2021-03-27 – 2021-03-28 (×4): 1 [IU] via SUBCUTANEOUS
  Administered 2021-03-28: 3 [IU] via SUBCUTANEOUS
  Administered 2021-03-29 (×3): 1 [IU] via SUBCUTANEOUS
  Administered 2021-03-30: 2 [IU] via SUBCUTANEOUS
  Administered 2021-03-30 – 2021-03-31 (×5): 1 [IU] via SUBCUTANEOUS
  Administered 2021-04-01: 2 [IU] via SUBCUTANEOUS
  Administered 2021-04-01: 1 [IU] via SUBCUTANEOUS
  Administered 2021-04-03: 2 [IU] via SUBCUTANEOUS
  Administered 2021-04-03: 4 [IU] via SUBCUTANEOUS
  Administered 2021-04-03 – 2021-04-06 (×3): 1 [IU] via SUBCUTANEOUS
  Administered 2021-04-07: 2 [IU] via SUBCUTANEOUS

## 2021-03-26 MED ORDER — LOSARTAN POTASSIUM 50 MG PO TABS
100.0000 mg | ORAL_TABLET | Freq: Every day | ORAL | Status: DC
  Administered 2021-03-27 – 2021-04-07 (×12): 100 mg via ORAL
  Filled 2021-03-26 (×12): qty 2

## 2021-03-26 MED ORDER — SERTRALINE HCL 50 MG PO TABS
50.0000 mg | ORAL_TABLET | Freq: Every day | ORAL | Status: DC
  Administered 2021-03-27 – 2021-04-07 (×12): 50 mg via ORAL
  Filled 2021-03-26 (×12): qty 1

## 2021-03-26 MED ORDER — ALBUTEROL SULFATE HFA 108 (90 BASE) MCG/ACT IN AERS
2.0000 | INHALATION_SPRAY | Freq: Once | RESPIRATORY_TRACT | Status: AC
  Administered 2021-03-26: 2 via RESPIRATORY_TRACT
  Filled 2021-03-26: qty 6.7

## 2021-03-26 MED ORDER — CARVEDILOL 3.125 MG PO TABS
3.1250 mg | ORAL_TABLET | Freq: Two times a day (BID) | ORAL | Status: DC
  Administered 2021-03-26 – 2021-03-30 (×8): 3.125 mg via ORAL
  Filled 2021-03-26 (×8): qty 1

## 2021-03-26 MED ORDER — ENOXAPARIN SODIUM 40 MG/0.4ML IJ SOSY
40.0000 mg | PREFILLED_SYRINGE | INTRAMUSCULAR | Status: DC
  Administered 2021-03-26 – 2021-04-05 (×11): 40 mg via SUBCUTANEOUS
  Filled 2021-03-26 (×10): qty 0.4

## 2021-03-26 MED ORDER — FINASTERIDE 5 MG PO TABS
5.0000 mg | ORAL_TABLET | Freq: Every day | ORAL | Status: DC
  Administered 2021-03-27 – 2021-04-07 (×12): 5 mg via ORAL
  Filled 2021-03-26 (×12): qty 1

## 2021-03-26 MED ORDER — FUROSEMIDE 20 MG PO TABS
20.0000 mg | ORAL_TABLET | ORAL | Status: DC
  Administered 2021-03-27 – 2021-04-07 (×7): 20 mg via ORAL
  Filled 2021-03-26 (×7): qty 1

## 2021-03-26 MED ORDER — ONDANSETRON HCL 4 MG/2ML IJ SOLN: 4.0000 mg | Freq: Four times a day (QID) | INTRAMUSCULAR | Status: DC | PRN

## 2021-03-26 MED ORDER — BISACODYL 10 MG RE SUPP: 10.0000 mg | Freq: Every day | RECTAL | Status: DC | PRN

## 2021-03-26 MED ORDER — ONDANSETRON HCL 4 MG PO TABS: 4.0000 mg | ORAL_TABLET | Freq: Four times a day (QID) | ORAL | Status: DC | PRN

## 2021-03-26 MED ORDER — VITAMIN B-12 1000 MCG PO TABS
1000.0000 ug | ORAL_TABLET | Freq: Every day | ORAL | Status: DC
  Administered 2021-03-27 – 2021-04-07 (×12): 1000 ug via NASOGASTRIC
  Filled 2021-03-26 (×12): qty 1

## 2021-03-26 MED ORDER — ASCORBIC ACID 500 MG PO TABS: 500.0000 mg | ORAL_TABLET | Freq: Every day | ORAL | Status: DC

## 2021-03-26 MED ORDER — PANTOPRAZOLE SODIUM 40 MG PO TBEC
40.0000 mg | DELAYED_RELEASE_TABLET | Freq: Every day | ORAL | Status: DC
  Administered 2021-03-26 – 2021-04-07 (×13): 40 mg via ORAL
  Filled 2021-03-26 (×13): qty 1

## 2021-03-26 MED ORDER — INSULIN ASPART 100 UNIT/ML IJ SOLN
0.0000 [IU] | Freq: Every day | INTRAMUSCULAR | Status: DC
Start: 2021-03-26 — End: 2021-04-07
  Administered 2021-03-26 – 2021-03-27 (×2): 2 [IU] via SUBCUTANEOUS
  Administered 2021-03-28: 3 [IU] via SUBCUTANEOUS
  Administered 2021-03-31 – 2021-04-03 (×2): 2 [IU] via SUBCUTANEOUS
  Filled 2021-03-26: qty 1

## 2021-03-26 MED ORDER — FERROUS SULFATE 325 (65 FE) MG PO TABS
325.0000 mg | ORAL_TABLET | Freq: Every day | ORAL | Status: DC
  Administered 2021-03-27 – 2021-04-07 (×12): 325 mg via ORAL
  Filled 2021-03-26 (×12): qty 1

## 2021-03-26 MED ORDER — SODIUM CHLORIDE 0.9% FLUSH
3.0000 mL | INTRAVENOUS | Status: DC | PRN
  Administered 2021-03-26 – 2021-04-04 (×3): 3 mL via INTRAVENOUS

## 2021-03-26 MED ORDER — METHYLPREDNISOLONE SODIUM SUCC 125 MG IJ SOLR
125.0000 mg | Freq: Once | INTRAMUSCULAR | Status: AC
  Administered 2021-03-26: 125 mg via INTRAVENOUS
  Filled 2021-03-26: qty 2

## 2021-03-26 MED ORDER — TRAZODONE HCL 50 MG PO TABS
50.0000 mg | ORAL_TABLET | Freq: Every evening | ORAL | Status: DC | PRN
  Administered 2021-03-26 – 2021-04-01 (×2): 50 mg via ORAL
  Filled 2021-03-26 (×2): qty 1

## 2021-03-26 MED ORDER — GUAIFENESIN-DM 100-10 MG/5ML PO SYRP
10.0000 mL | ORAL_SOLUTION | ORAL | Status: DC | PRN
  Administered 2021-03-31 – 2021-04-04 (×3): 10 mL via ORAL
  Filled 2021-03-26 (×3): qty 10

## 2021-03-26 MED ORDER — NITROGLYCERIN 0.4 MG SL SUBL: 0.4000 mg | SUBLINGUAL_TABLET | SUBLINGUAL | Status: DC | PRN

## 2021-03-26 MED ORDER — SODIUM CHLORIDE 0.9% FLUSH
3.0000 mL | Freq: Two times a day (BID) | INTRAVENOUS | Status: DC
  Administered 2021-03-26 – 2021-04-07 (×22): 3 mL via INTRAVENOUS

## 2021-03-26 MED ORDER — POLYETHYLENE GLYCOL 3350 17 G PO PACK: 17.0000 g | PACK | Freq: Every day | ORAL | Status: DC | PRN

## 2021-03-26 MED ORDER — PREDNISONE 20 MG PO TABS
50.0000 mg | ORAL_TABLET | Freq: Every day | ORAL | Status: DC
  Administered 2021-03-27: 50 mg via ORAL
  Filled 2021-03-26: qty 1

## 2021-03-26 MED ORDER — IOHEXOL 350 MG/ML SOLN
100.0000 mL | Freq: Once | INTRAVENOUS | Status: AC | PRN
  Administered 2021-03-26: 100 mL via INTRAVENOUS

## 2021-03-26 MED ORDER — SODIUM CHLORIDE 0.9 % IV SOLN: 250.0000 mL | INTRAVENOUS | Status: DC | PRN

## 2021-03-26 NOTE — H&P (Signed)
Patient Demographics:    Manuel Le, is a 87 y.o. male  MRN: [id number]   DOB - 13-Jan-1927  Admit Date - 03/26/2021  Outpatient Primary MD for the patient is Celene Squibb, MD   Assessment & Plan:    Principal Problem:   Acute respiratory disease due to COVID-19 virus Active Problems:   Acute on chronic respiratory failure with hypoxia (HCC)   Atrial fibrillation (HCC)   HTN (hypertension)   S/P coronary artery stent placement (RCA bare metal stent on 07/08/14)    1)Acute hypoxic respiratory failure secondary to COVID-19 infection/Pneumonia--- -Patient is unvaccinated --Patient was recently admitted to the hospital from 03/18/2021 through 03/22/2021--- was discharged home with home health after patient and family refused SNF rehab --CTA chest on 03/26/2021 with extensive worsening COVID PNA,  No acute pulmonary embolism -Patient with worsening hypoxia now requiring 5 L of oxygen -Previously treated with remdesivir and steroids during recent hospitalization   The treatment plan and use of medications  for treatment of COVID-19 infection and possible side effects were discussed with patient/family (HCPOA---[name])--- -Start baricitinib -----Patient/Family verbalizes understanding and agrees to treatment protocols   --Patient is positive for COVID-19 infection, chest x-ray with findings of infiltrates/opacities,  patient is tachypneic/hypoxic and requiring continuous supplemental oxygen--- --Check and trend inflammatory markers including D-dimer, ferritin and  CRP---also follow CBC and CMP --Supplemental oxygen to keep O2 sats above 93% -Follow serial chest x-rays and ABGs as indicated --- Encourage prone positioning for More than 16 hours/day in increments of 2 to 3 hours at a time if able to  tolerate --Attempt to maintain euvolemic state --Zinc and vitamin C as ordered -Albuterol inhaler as needed -Accu-Cheks/fingersticks while on high-dose steroids -PPI while on high-dose steroids  2)Social/Ethics--- Discussed with HCPOA---[name]---  Pt is a DNR/DNI -Palliative care consult requested  3)HFpEF--- last known EF 50 to 55% based on echo from 2019, -BNP 68 -No overt  CHF exacerbation at this time -Continue Lasix, Coreg and losartan  4)Generalized weakness/deconditioning--- will need PT OT eval and possible SNF placement  5) chronic A. fib/CAD-----stable, continue aspirin and Coreg as well as losartan  6)Leukocytosis-WBC 16.8 in the setting of steroid use, no evidence of acute bacterial infection at this time - 7) hyperglycemia----suspect due to due to steroid use, Use Novolog/Humalog Sliding scale insulin with Accu-Cheks/Fingersticks as ordered   Disposition/Need for in-Hospital Stay- patient unable to be discharged at this time due to --worsening COVID-pneumonia worsening hypoxic respiratory failure requiring steroids and supplemental oxygen*  Status is: Inpatient  Remains inpatient appropriate because: See disposition above  Dispo: The patient is from: Home              Anticipated d/c is to: SNF              Anticipated d/c date is: 3 days              Patient currently is not  medically stable to d/c. Barriers: Not Clinically Stable-    With History of - Reviewed by me  Past Medical History:  Diagnosis Date   Arthritis    OSTEO   Atrial fibrillation and flutter ([place])    Bladder cancer ([place])    Coronary artery disease    Dysrhythmia    Exertional angina ([place]) 07/08/2014   H/O urinary frequency    Hematuria    Hypertension    Iron deficiency anemia    Kidney stones    hx of    Mild obstructive sleep apnea    no cpap   S/P coronary artery stent placement 07/24/2014   Skin cancer    Weakness       Past Surgical History:  Procedure  Laterality Date   CARDIAC CATHETERIZATION     CARDIOVASCULAR STRESS TEST  12-15-2013  DR Kate Sable ([place])   MILD - MODERATE PERI-INFARCT ISCHEMIA  OF INFERIOR WALL, MID-INFEROSEPTAL WALL, MID-INFEROLATERAL WALL, & BASAL INFERIOR WALL/   NORMAL LVF /  EF 59%/  INTERMEDIATE RISK STUDY   COLONOSCOPY  07/04/2012   Procedure: COLONOSCOPY;  Surgeon: Rogene Houston, MD;  [facility];  Service: Endoscopy;  Laterality: N/A;  320   CORONARY STENT PLACEMENT  07/08/2014   PTCA of RCA   DR COOPER   CYSTOSCOPY W/ RETROGRADES Bilateral 12/31/2013   Procedure: CYSTOSCOPY WITH RETROGRADE PYELOGRAM;  Surgeon: Alexis Frock, MD;  Location: [facility];  Service: Urology;  Laterality: Bilateral;   CYSTOSCOPY W/ RETROGRADES Bilateral 01/20/2015   Procedure: CYSTOSCOPY WITH RETROGRADE PYELOGRAM;  Surgeon: Alexis Frock, MD;  Location: [facility];  Service: Urology;  Laterality: Bilateral;   ESOPHAGOGASTRODUODENOSCOPY  07/04/2012   Procedure: ESOPHAGOGASTRODUODENOSCOPY (EGD);  Surgeon: Rogene Houston, MD;  [facility];  Service: Endoscopy;  Laterality: N/A;   EYE SURGERY     past cataract surgery bilat   HERNIA REPAIR Right    LEFT HEART CATHETERIZATION WITH CORONARY ANGIOGRAM N/A 07/08/2014   Procedure: LEFT HEART CATHETERIZATION WITH CORONARY ANGIOGRAM;  Surgeon: Blane Ohara, MD;  Location: [facility] CATH LAB;  Service: Cardiovascular;  Laterality: N/A;   PERCUTANEOUS CORONARY STENT INTERVENTION (PCI-S)  07/08/2014   Procedure: PERCUTANEOUS CORONARY STENT INTERVENTION (PCI-S);  Surgeon: Blane Ohara, MD;  Location: [facility] CATH LAB;  Service: Cardiovascular;;   TOTAL KNEE ARTHROPLASTY Bilateral 2000  &  2003?   TRANSTHORACIC ECHOCARDIOGRAM  09-11-2013   MODERATE LVH/  EF 55-60%/  MILD MR & TR/  MILD TO MODERATE CALCIFIED AV WITHOUT STENOSIS/  MILD LAE/   MODERATE PR   TRANSURETHRAL RESECTION OF BLADDER TUMOR N/A 01/20/2015   Procedure: TRANSURETHRAL RESECTION OF BLADDER TUMOR  (TURBT);  Surgeon: Alexis Frock, MD;  Location: [facility];  Service: Urology;  Laterality: N/A;   TRANSURETHRAL RESECTION OF BLADDER TUMOR WITH GYRUS (TURBT-GYRUS) N/A 12/31/2013   Procedure: TRANSURETHRAL RESECTION OF BLADDER TUMOR WITH GYRUS (TURBT-GYRUS);  Surgeon: Alexis Frock, MD;  Location: [facility];  Service: Urology;  Laterality: N/A;   ZENKER'S DIVERTICULECTOMY N/A 09/09/2015   Procedure: ENDOSCOPIC ZENKER'S DIVERTICULECTOMY;  Surgeon: Izora Gala, MD;  Location: [place];  Service: ENT;  Laterality: N/A;   ZENKER'S DIVERTICULECTOMY ENDOSCOPIC  09/09/2015      Chief Complaint  Patient presents with   Shortness of Breath      HPI:    Manuel Le  is a 87 y.o. male reformed smoker with past medical history relevant for chronic A. fib/CAD, HTN, BPH --Patient was recently admitted to the  hospital from 03/18/2021 through 03/22/2021 for COVID-pneumonia -was treated with remdesivir and steroids--- was discharged home with home health after patient and family refused SNF rehab --Patient is unvaccinated -Patient returns with worsening hypoxia now requiring up to 5 L of oxygen via nasal cannula --CTA chest on 03/26/2021 with extensive worsening COVID PNA,  No acute pulmonary embolism -discussed with patient/family (HCPOA---[name])--- - -BNP 68, troponin is not elevated, D-dimer is up to 1.55 from 0.554 days ago however CTA chest as noted above is without acute PE -WBC 16.8 in the setting of steroid use -Glucose is 299 in the setting of steroid use -Creatinine 0.87    Review of systems:    In addition to the HPI above,   A full Review of  Systems was done, all other systems reviewed are negative except as noted above in HPI , .    Social History:  Reviewed by me    Social History   Tobacco Use   Smoking status: Former    Packs/day: 2.00    Years: 20.00    Pack years: 40.00    Types: Cigarettes, Cigars, Pipe    Quit date: 08/21/1968    Years since quitting: 52.6    Smokeless tobacco: Never   Tobacco comments:    quit smoking in 1980  Substance Use Topics   Alcohol use: No    Alcohol/week: 0.0 standard drinks       Family History :  Reviewed by me  HTN   Home Medications:   Prior to Admission medications   Medication Sig Start Date End Date Taking? Authorizing Provider  albuterol (VENTOLIN HFA) 108 (90 Base) MCG/ACT inhaler Inhale 2 puffs into the lungs every 4 (four) hours as needed for shortness of breath or wheezing. 01/26/21   [provider]  ascorbic acid (VITAMIN C) 500 MG tablet Take 500 mg by mouth daily.    [provider]  aspirin 81 MG tablet Take 1 tablet (81 mg total) by mouth daily. 09/09/18   Herminio Commons, MD  carvedilol (COREG) 3.125 MG tablet Take 3.125 mg by mouth 2 (two) times daily. 05/17/20   [provider]  Cholecalciferol (VITAMIN D3) 25 MCG (1000 UT) CAPS Take 1 capsule by mouth daily.    [provider]  ferrous sulfate 325 (65 FE) MG EC tablet Take 325 mg by mouth daily with breakfast.    [provider]  finasteride (PROSCAR) 5 MG tablet Take 5 mg by mouth daily.    [provider]  furosemide (LASIX) 20 MG tablet Take 20 mg by mouth See admin instructions. 09/29/20   [provider]  guaiFENesin-dextromethorphan (ROBITUSSIN DM) 100-10 MG/5ML syrup Take 10 mLs by mouth every 4 (four) hours as needed for cough. 03/21/21   Manuella Ghazi, Pratik D, DO  losartan (COZAAR) 100 MG tablet Take 100 mg by mouth daily.    [provider]  Melatonin 3 MG CAPS Take 1 capsule by mouth at bedtime. Take with 5 mg    [provider]  melatonin 5 MG TABS Take 5 mg by mouth at bedtime. Take with 3 mg    [provider]  Misc Natural Products (OSTEO BI-FLEX ADV JOINT SHIELD) TABS Take 1 tablet by mouth daily.     [provider]  Multiple Vitamins-Minerals (CENTRUM SILVER ADULT 50+) TABS Take 1 tablet by mouth every morning.    [provider]  nitroGLYCERIN (NITROSTAT) 0.4 MG SL tablet Place 1 tablet (0.4 mg  total) under the tongue every 5 (five) minutes as needed for chest pain. 07/03/14   Herminio Commons, MD  Omega-3 Fatty Acids (FISH OIL) 1000 MG CAPS Take 1 capsule by mouth daily.    [provider]  potassium chloride (KLOR-CON) 10 MEQ tablet Take 10 mEq by mouth See admin instructions. 09/29/20   [provider]  predniSONE (DELTASONE) 20 MG tablet Take 2 tablets (40 mg total) by mouth daily for 5 days. 03/21/21 03/26/21  Manuella Ghazi, Pratik D, DO  sertraline (ZOLOFT) 50 MG tablet Take 50 mg by mouth daily. 06/30/19   [provider]  vitamin B-12 (CYANOCOBALAMIN) 1000 MCG tablet 1 tablet (1,000 mcg total) by Per NG tube route daily. 09/15/15   Izora Gala, MD     Allergies:    Not on File   Physical Exam:   Vitals  Blood pressure 122/61, pulse 69, temperature 98.6 F (37 C), temperature source Oral, resp. rate 19, height 6' (1.829 m), weight 97.1 kg, SpO2 90 %.  Physical Examination: General appearance - alert, ill-appearing,  Mental status - alert, oriented to person, place,   Eyes - sclera anicteric Neck - supple, no JVD elevation , Chest -diminished breath sounds with scattered rhonchi and wheezes bilaterally  heart - S1 and S2 normal, regular  Abdomen - soft, nontender, nondistended, no masses or organomegaly Neurological -generalized weakness, screening mental status exam normal, neck supple without rigidity, cranial nerves II through XII intact, DTR's normal and symmetric Extremities - no pedal edema noted, intact peripheral pulses  Skin - warm, dry     Data Review:    CBC Recent Labs  Lab 03/20/21 0634 03/21/21 0630 03/22/21 0625 03/26/21 1722  WBC 7.8 11.5* 10.9* 16.8*  HGB 14.0 15.0 14.9 13.8  HCT 41.4 44.8 44.1 41.5  PLT 175 205 191 237  MCV 90.6 92.4 92.6 93.5  MCH 30.6 30.9 31.3 31.1  MCHC 33.8 33.5 33.8 33.3  RDW 13.1 13.3 13.0 13.3  LYMPHSABS 0.4* 0.5* 0.6* 0.2*   MONOABS 0.5 0.7 0.6 0.6  EOSABS 0.0 0.0 0.0 0.0  BASOSABS 0.0 0.0 0.0 0.0   ------------------------------------------------------------------------------------------------------------------  Chemistries  Recent Labs  Lab 03/20/21 0634 03/21/21 0630 03/22/21 0625 03/26/21 1722  NA 137 140 138 134*  K 3.4* 3.7 4.1 4.0  CL 105 105 104 104  CO2 '24 26 28 22  '$ GLUCOSE 161* 158* 174* 299*  BUN 24* 23 26* 25*  CREATININE 0.65 0.66 0.61 0.87  CALCIUM 8.6* 8.8* 8.7* 8.4*  MG 2.2 2.2 2.3  --   AST 31 29 33 37  ALT 23 26 37 31  ALKPHOS 44 42 44 49  BILITOT 0.9 0.7 0.9 0.8   ------------------------------------------------------------------------------------------------------------------ estimated creatinine clearance is 65.4 mL/min (by C-G formula based on SCr of 0.87 mg/dL). ------------------------------------------------------------------------------------------------------------------ No results for input(s): TSH, T4TOTAL, T3FREE, THYROIDAB in the last 72 hours.  Invalid input(s): FREET3   Coagulation profile No results for input(s): INR, PROTIME in the last 168 hours. ------------------------------------------------------------------------------------------------------------------- Recent Labs    03/26/21 1722  DDIMER 1.55*   -------------------------------------------------------------------------------------------------------------------  Cardiac Enzymes No results for input(s): CKMB, TROPONINI, MYOGLOBIN in the last 168 hours.  Invalid input(s): CK ------------------------------------------------------------------------------------------------------------------    Component Value Date/Time   BNP 68.0 03/26/2021 1722     ---------------------------------------------------------------------------------------------------------------  Urinalysis    Component Value Date/Time   COLORURINE YELLOW 10/20/2020 [place] 10/20/2020 1218   LABSPEC  1.010 10/20/2020 1218   PHURINE 5.5 10/20/2020 [place]  10/20/2020 [place] (A) 10/20/2020 [place] 10/20/2020 [place] 10/20/2020 1218   PROTEINUR NEGATIVE 10/20/2020 1218   UROBILINOGEN 1.0 11/27/2013 2214   NITRITE NEGATIVE 10/20/2020 1218   LEUKOCYTESUR TRACE (A) 10/20/2020 1218    ----------------------------------------------------------------------------------------------------------------   Imaging Results:    CT Angio Chest PE W and/or Wo Contrast  Result Date: 03/26/2021 CLINICAL DATA:  PE suspected, recent COVID, home oxygen EXAM: CT ANGIOGRAPHY CHEST WITH CONTRAST TECHNIQUE: Multidetector CT imaging of the chest was performed using the standard protocol during bolus administration of intravenous contrast. Multiplanar CT image reconstructions and MIPs were obtained to evaluate the vascular anatomy. CONTRAST:  153m OMNIPAQUE IOHEXOL 350 MG/ML SOLN COMPARISON:  03/18/2021 FINDINGS: Cardiovascular: Satisfactory opacification of the pulmonary arteries to the segmental level. No evidence of pulmonary embolism. Cardiomegaly. Three-vessel coronary artery calcifications and/or stents. No pericardial effusion. Enlargement of the main pulmonary artery measuring up to 3.6 cm. Aortic atherosclerosis. Mediastinum/Nodes: No enlarged mediastinal, hilar, or axillary lymph nodes. Thyroid gland, trachea, and esophagus demonstrate no significant findings. Lungs/Pleura: Extensive peripheral ground-glass airspace opacity present bilaterally, predominantly in the dependent lungs, slightly worsened compared to prior examination. There is likely some degree of underlying scarring of the bilateral lung bases. Upper Abdomen: No acute abnormality. Musculoskeletal: No chest wall abnormality. No acute or significant osseous findings. Review of the MIP images confirms the above findings. IMPRESSION: 1. Negative examination for pulmonary embolism. 2.  Extensive peripheral ground-glass airspace opacity present bilaterally, predominantly in the dependent lungs, slightly worsened compared to prior examination and in keeping with COVID-19 airspace disease. 3. Cardiomegaly and coronary artery disease. 4. Enlargement of the main pulmonary artery, as can be seen in pulmonary hypertension. Aortic Atherosclerosis (ICD10-I70.0). Electronically Signed   By: AEddie CandleM.D.   On: 03/26/2021 20:05   DG Chest Port 1 View  Result Date: 03/26/2021 CLINICAL DATA:  Shortness of breath. EXAM: PORTABLE CHEST 1 VIEW COMPARISON:  03/18/2021. FINDINGS: Low lung volumes with mildly increased diffuse interstitial opacities. No visible pleural effusions or pneumothorax. Right lung apex is partially obscured by overlapping neck/face. Stable cardiomediastinal silhouette. Calcific atherosclerosis of the aorta. No acute osseous abnormality. IMPRESSION: Low lung volumes with mildly increased diffuse interstitial opacities, potentially mild interstitial edema or atypical infection. Dedicated PA and lateral radiographs may be helpful if the patient is able. Electronically Signed   By: FMargaretha SheffieldMD   On: 03/26/2021 17:39    Radiological Exams on Admission: CT Angio Chest PE W and/or Wo Contrast  Result Date: 03/26/2021 CLINICAL DATA:  PE suspected, recent COVID, home oxygen EXAM: CT ANGIOGRAPHY CHEST WITH CONTRAST TECHNIQUE: Multidetector CT imaging of the chest was performed using the standard protocol during bolus administration of intravenous contrast. Multiplanar CT image reconstructions and MIPs were obtained to evaluate the vascular anatomy. CONTRAST:  1026mOMNIPAQUE IOHEXOL 350 MG/ML SOLN COMPARISON:  03/18/2021 FINDINGS: Cardiovascular: Satisfactory opacification of the pulmonary arteries to the segmental level. No evidence of pulmonary embolism. Cardiomegaly. Three-vessel coronary artery calcifications and/or stents. No pericardial effusion. Enlargement of the main  pulmonary artery measuring up to 3.6 cm. Aortic atherosclerosis. Mediastinum/Nodes: No enlarged mediastinal, hilar, or axillary lymph nodes. Thyroid gland, trachea, and esophagus demonstrate no significant findings. Lungs/Pleura: Extensive peripheral ground-glass airspace opacity present bilaterally, predominantly in the dependent lungs, slightly worsened compared to prior examination. There is likely some degree of underlying scarring of the bilateral lung bases. Upper Abdomen: No acute abnormality. Musculoskeletal: No chest wall abnormality. No acute or  significant osseous findings. Review of the MIP images confirms the above findings. IMPRESSION: 1. Negative examination for pulmonary embolism. 2. Extensive peripheral ground-glass airspace opacity present bilaterally, predominantly in the dependent lungs, slightly worsened compared to prior examination and in keeping with COVID-19 airspace disease. 3. Cardiomegaly and coronary artery disease. 4. Enlargement of the main pulmonary artery, as can be seen in pulmonary hypertension. Aortic Atherosclerosis (ICD10-I70.0). Electronically Signed   By: Eddie Candle M.D.   On: 03/26/2021 20:05   DG Chest Port 1 View  Result Date: 03/26/2021 CLINICAL DATA:  Shortness of breath. EXAM: PORTABLE CHEST 1 VIEW COMPARISON:  03/18/2021. FINDINGS: Low lung volumes with mildly increased diffuse interstitial opacities. No visible pleural effusions or pneumothorax. Right lung apex is partially obscured by overlapping neck/face. Stable cardiomediastinal silhouette. Calcific atherosclerosis of the aorta. No acute osseous abnormality. IMPRESSION: Low lung volumes with mildly increased diffuse interstitial opacities, potentially mild interstitial edema or atypical infection. Dedicated PA and lateral radiographs may be helpful if the patient is able. Electronically Signed   By: Margaretha Sheffield MD   On: 03/26/2021 17:39    DVT Prophylaxis -SCD/Lovenox AM Labs Ordered, also please  review Full Orders  Family Communication: Admission, patients condition and plan of care including tests being ordered have been discussed with the patient and HCPOA Mr Kipp Brood who indicate understanding and agree with the plan   Code Status - DNR  Likely DC to  SNF when respiratory status improved  Condition   stable  Roxan Hockey M.D on 03/26/2021 at 9:45 PM Go to [email or web address] -  for contact info  [facility] - Office  [phone]

## 2021-03-26 NOTE — ED Provider Notes (Signed)
[facility] EMERGENCY DEPARTMENT Provider Note   CSN: [id number] Arrival date & time: 03/26/21  1641     History Chief Complaint  Patient presents with   Shortness of Breath    Manuel Le is a 87 y.o. male.  Patient complains of shortness of breath.  He has COVID-19 and has been on 2 L at home.  Patient complains of weakness  The history is provided by the patient and medical records. No language interpreter was used.  Shortness of Breath Severity:  Moderate Onset quality:  Sudden Timing:  Constant Progression:  Worsening Chronicity:  Recurrent Context: activity   Associated symptoms: no abdominal pain, no chest pain, no cough, no headaches and no rash       Past Medical History:  Diagnosis Date   Arthritis    OSTEO   Atrial fibrillation and flutter (HCC)    Bladder cancer ([place])    Coronary artery disease    Dysrhythmia    Exertional angina ([place]) 07/08/2014   H/O urinary frequency    Hematuria    Hypertension    Iron deficiency anemia    Kidney stones    hx of    Mild obstructive sleep apnea    no cpap   S/P coronary artery stent placement 07/24/2014   Skin cancer    Weakness     Patient Active Problem List   Diagnosis Date Noted   Acute respiratory disease due to COVID-19 virus 03/26/2021   Acute on chronic respiratory failure with hypoxia ([place]) 03/26/2021   Pneumonia due to COVID-19 virus 03/18/2021   Acute respiratory failure due to COVID-19 ([place]) 03/18/2021   Chronic diastolic CHF (congestive heart failure) ([place]) 03/18/2021   Generalized weakness 03/18/2021   Truncal ataxia 10/20/2020   Dizzy 10/20/2020   TIA (transient ischemic attack) 10/20/2020   Acute respiratory failure with hypoxia (HCC)    Influenza with pneumonia    Palliative care encounter    Sepsis ([place]) 09/25/2017   Fever 09/25/2017   UTI (urinary tract infection) 09/25/2017   Abnormal liver function 09/25/2017   Tachycardia 09/25/2017   Influenza A    Pressure ulcer 09/14/2015    Zenker's diverticulum 09/09/2015   Bladder cancer ([place]) 07/14/2015   CAD (coronary artery disease) 07/24/2014   S/P coronary artery stent placement (RCA bare metal stent on 07/08/14) 07/24/2014   Exertional angina ([place]) 07/08/2014   Encounter for therapeutic drug monitoring 09/12/2013   Atrial fibrillation ([place]) 09/08/2013   HTN (hypertension) 09/08/2013   Fatigue 09/08/2013   Anemia 06/26/2012    Past Surgical History:  Procedure Laterality Date   CARDIAC CATHETERIZATION     CARDIOVASCULAR STRESS TEST  12-15-2013  DR Kate Sable ([place])   MILD - MODERATE PERI-INFARCT ISCHEMIA  OF INFERIOR WALL, MID-INFEROSEPTAL WALL, MID-INFEROLATERAL WALL, & BASAL INFERIOR WALL/   NORMAL LVF /  EF 59%/  INTERMEDIATE RISK STUDY   COLONOSCOPY  07/04/2012   Procedure: COLONOSCOPY;  Surgeon: Rogene Houston, MD;  Location: [facility];  Service: Endoscopy;  Laterality: N/A;  320   CORONARY STENT PLACEMENT  07/08/2014   PTCA of RCA   DR COOPER   CYSTOSCOPY W/ RETROGRADES Bilateral 12/31/2013   Procedure: CYSTOSCOPY WITH RETROGRADE PYELOGRAM;  Surgeon: Alexis Frock, MD;  Location: [facility] ORS;  Service: Urology;  Laterality: Bilateral;   CYSTOSCOPY W/ RETROGRADES Bilateral 01/20/2015   Procedure: CYSTOSCOPY WITH RETROGRADE PYELOGRAM;  Surgeon: Alexis Frock, MD;  Location: [facility];  Service: Urology;  Laterality: Bilateral;  ESOPHAGOGASTRODUODENOSCOPY  07/04/2012   Procedure: ESOPHAGOGASTRODUODENOSCOPY (EGD);  Surgeon: Rogene Houston, MD;  Location: [facility];  Service: Endoscopy;  Laterality: N/A;   EYE SURGERY     past cataract surgery bilat   HERNIA REPAIR Right    LEFT HEART CATHETERIZATION WITH CORONARY ANGIOGRAM N/A 07/08/2014   Procedure: LEFT HEART CATHETERIZATION WITH CORONARY ANGIOGRAM;  Surgeon: Blane Ohara, MD;  Location: [facility] CATH LAB;  Service: Cardiovascular;  Laterality: N/A;   PERCUTANEOUS CORONARY STENT INTERVENTION (PCI-S)  07/08/2014   Procedure:  PERCUTANEOUS CORONARY STENT INTERVENTION (PCI-S);  Surgeon: Blane Ohara, MD;  Location: [facility] CATH LAB;  Service: Cardiovascular;;   TOTAL KNEE ARTHROPLASTY Bilateral 2000  &  2003?   TRANSTHORACIC ECHOCARDIOGRAM  09-11-2013   MODERATE LVH/  EF 55-60%/  MILD MR & TR/  MILD TO MODERATE CALCIFIED AV WITHOUT STENOSIS/  MILD LAE/   MODERATE PR   TRANSURETHRAL RESECTION OF BLADDER TUMOR N/A 01/20/2015   Procedure: TRANSURETHRAL RESECTION OF BLADDER TUMOR (TURBT);  Surgeon: Alexis Frock, MD;  Location: [facility];  Service: Urology;  Laterality: N/A;   TRANSURETHRAL RESECTION OF BLADDER TUMOR WITH GYRUS (TURBT-GYRUS) N/A 12/31/2013   Procedure: TRANSURETHRAL RESECTION OF BLADDER TUMOR WITH GYRUS (TURBT-GYRUS);  Surgeon: Alexis Frock, MD;  Location: [facility];  Service: Urology;  Laterality: N/A;   ZENKER'S DIVERTICULECTOMY N/A 09/09/2015   Procedure: ENDOSCOPIC ZENKER'S DIVERTICULECTOMY;  Surgeon: Izora Gala, MD;  Location: [place];  Service: ENT;  Laterality: N/A;   ZENKER'S DIVERTICULECTOMY ENDOSCOPIC  09/09/2015       History reviewed. No pertinent family history.  Social History   Tobacco Use   Smoking status: Former    Packs/day: 2.00    Years: 20.00    Pack years: 40.00    Types: Cigarettes, Cigars, Pipe    Quit date: 08/21/1968    Years since quitting: 52.6   Smokeless tobacco: Never   Tobacco comments:    quit smoking in 1980  Substance Use Topics   Alcohol use: No    Alcohol/week: 0.0 standard drinks   Drug use: No    Home Medications Prior to Admission medications   Medication Sig Start Date End Date Taking? Authorizing Provider  albuterol (VENTOLIN HFA) 108 (90 Base) MCG/ACT inhaler Inhale 2 puffs into the lungs every 4 (four) hours as needed for shortness of breath or wheezing. 01/26/21   [provider]  ascorbic acid (VITAMIN C) 500 MG tablet Take 500 mg by mouth daily.    [provider]  aspirin 81 MG tablet Take 1 tablet (81 mg total) by mouth daily.  09/09/18   Herminio Commons, MD  carvedilol (COREG) 3.125 MG tablet Take 3.125 mg by mouth 2 (two) times daily. 05/17/20   [provider]  Cholecalciferol (VITAMIN D3) 25 MCG (1000 UT) CAPS Take 1 capsule by mouth daily.    [provider]  ferrous sulfate 325 (65 FE) MG EC tablet Take 325 mg by mouth daily with breakfast.    [provider]  finasteride (PROSCAR) 5 MG tablet Take 5 mg by mouth daily.    [provider]  furosemide (LASIX) 20 MG tablet Take 20 mg by mouth See admin instructions. 09/29/20   [provider]  guaiFENesin-dextromethorphan (ROBITUSSIN DM) 100-10 MG/5ML syrup Take 10 mLs by mouth every 4 (four) hours as needed for cough. 03/21/21   Manuella Ghazi, Pratik D, DO  losartan (COZAAR) 100 MG tablet Take 100 mg by mouth daily.    [provider]  Melatonin 3 MG CAPS Take 1 capsule by mouth at bedtime. Take with 5 mg    [provider]  melatonin 5 MG TABS Take 5 mg by mouth at bedtime. Take with 3 mg    [provider]  Misc Natural Products (OSTEO BI-FLEX ADV JOINT SHIELD) TABS Take 1 tablet by mouth daily.     [provider]  Multiple Vitamins-Minerals (CENTRUM SILVER ADULT 50+) TABS Take 1 tablet by mouth every morning.    [provider]  nitroGLYCERIN (NITROSTAT) 0.4 MG SL tablet Place 1 tablet (0.4 mg total) under the tongue every 5 (five) minutes as needed for chest pain. 07/03/14   Herminio Commons, MD  Omega-3 Fatty Acids (FISH OIL) 1000 MG CAPS Take 1 capsule by mouth daily.    [provider]  potassium chloride (KLOR-CON) 10 MEQ tablet Take 10 mEq by mouth See admin instructions. 09/29/20   [provider]  predniSONE (DELTASONE) 20 MG tablet Take 2 tablets (40 mg total) by mouth daily for 5 days. 03/21/21 03/26/21  Manuella Ghazi, Pratik D, DO  sertraline (ZOLOFT) 50 MG tablet Take 50 mg by mouth daily. 06/30/19   [provider]  vitamin B-12 (CYANOCOBALAMIN) 1000 MCG  tablet 1 tablet (1,000 mcg total) by Per NG tube route daily. 09/15/15   Izora Gala, MD    Allergies    Patient has no allergy information on record.  Review of Systems   Review of Systems  Constitutional:  Negative for appetite change and fatigue.  HENT:  Negative for congestion, ear discharge and sinus pressure.   Eyes:  Negative for discharge.  Respiratory:  Positive for shortness of breath. Negative for cough.   Cardiovascular:  Negative for chest pain.  Gastrointestinal:  Negative for abdominal pain and diarrhea.  Genitourinary:  Negative for frequency and hematuria.  Musculoskeletal:  Negative for back pain.  Skin:  Negative for rash.  Neurological:  Negative for seizures and headaches.  Psychiatric/Behavioral:  Negative for hallucinations.    Physical Exam Updated Vital Signs BP 122/61   Pulse 69   Temp 98.6 F (37 C) (Oral)   Resp 19   Ht 6' (1.829 m)   Wt 97.1 kg   SpO2 90%   BMI 29.03 kg/m   Physical Exam Vitals and nursing note reviewed.  Constitutional:      Appearance: He is well-developed.  HENT:     Head: Normocephalic.     Nose: Nose normal.  Eyes:     General: No scleral icterus.    Conjunctiva/sclera: Conjunctivae normal.  Neck:     Thyroid: No thyromegaly.  Cardiovascular:     Rate and Rhythm: Normal rate and regular rhythm.     Heart sounds: No murmur heard.   No friction rub. No gallop.  Pulmonary:     Breath sounds: No stridor. No wheezing or rales.  Chest:     Chest wall: No tenderness.  Abdominal:     General: There is no distension.     Tenderness: There is no abdominal tenderness. There is no rebound.  Musculoskeletal:        General: Normal range of motion.     Cervical back: Neck supple.  Lymphadenopathy:     Cervical: No cervical adenopathy.  Skin:    Findings: No erythema or rash.  Neurological:     Mental Status: He is alert and oriented to person, place, and time.     Motor: No abnormal muscle tone.  Coordination:  Coordination normal.  Psychiatric:        Behavior: Behavior normal.    ED Results / Procedures / Treatments   Labs (all labs ordered are listed, but only abnormal results are displayed) Labs Reviewed  CBC WITH DIFFERENTIAL/PLATELET - Abnormal; Notable for the following components:      Result Value   WBC 16.8 (*)    Neutro Abs 15.9 (*)    Lymphs Abs 0.2 (*)    Abs Immature Granulocytes 0.10 (*)    All other components within normal limits  COMPREHENSIVE METABOLIC PANEL - Abnormal; Notable for the following components:   Sodium 134 (*)    Glucose, Bld 299 (*)    BUN 25 (*)    Calcium 8.4 (*)    Albumin 3.1 (*)    All other components within normal limits  D-DIMER, QUANTITATIVE - Abnormal; Notable for the following components:   D-Dimer, Quant 1.55 (*)    All other components within normal limits  BRAIN NATRIURETIC PEPTIDE  CBC  BASIC METABOLIC PANEL  TROPONIN I (HIGH SENSITIVITY)  TROPONIN I (HIGH SENSITIVITY)    EKG EKG Interpretation  Date/Time:  Saturday March 26 2021 16:45:31 EDT Ventricular Rate:  93 PR Interval:  199 QRS Duration: 123 QT Interval:  373 QTC Calculation: 464 R Axis:   -52 Text Interpretation: Sinus rhythm Multiple ventricular premature complexes RBBB and LAFB Minimal ST elevation, lateral leads Confirmed by Milton Ferguson [phone]) on 03/26/2021 5:34:44 PM  Radiology CT Angio Chest PE W and/or Wo Contrast  Result Date: 03/26/2021 CLINICAL DATA:  PE suspected, recent COVID, home oxygen EXAM: CT ANGIOGRAPHY CHEST WITH CONTRAST TECHNIQUE: Multidetector CT imaging of the chest was performed using the standard protocol during bolus administration of intravenous contrast. Multiplanar CT image reconstructions and MIPs were obtained to evaluate the vascular anatomy. CONTRAST:  175m OMNIPAQUE IOHEXOL 350 MG/ML SOLN COMPARISON:  03/18/2021 FINDINGS: Cardiovascular: Satisfactory opacification of the pulmonary arteries to the segmental level. No evidence of  pulmonary embolism. Cardiomegaly. Three-vessel coronary artery calcifications and/or stents. No pericardial effusion. Enlargement of the main pulmonary artery measuring up to 3.6 cm. Aortic atherosclerosis. Mediastinum/Nodes: No enlarged mediastinal, hilar, or axillary lymph nodes. Thyroid gland, trachea, and esophagus demonstrate no significant findings. Lungs/Pleura: Extensive peripheral ground-glass airspace opacity present bilaterally, predominantly in the dependent lungs, slightly worsened compared to prior examination. There is likely some degree of underlying scarring of the bilateral lung bases. Upper Abdomen: No acute abnormality. Musculoskeletal: No chest wall abnormality. No acute or significant osseous findings. Review of the MIP images confirms the above findings. IMPRESSION: 1. Negative examination for pulmonary embolism. 2. Extensive peripheral ground-glass airspace opacity present bilaterally, predominantly in the dependent lungs, slightly worsened compared to prior examination and in keeping with COVID-19 airspace disease. 3. Cardiomegaly and coronary artery disease. 4. Enlargement of the main pulmonary artery, as can be seen in pulmonary hypertension. Aortic Atherosclerosis (ICD10-I70.0). Electronically Signed   By: AEddie CandleM.D.   On: 03/26/2021 20:05   DG Chest Port 1 View  Result Date: 03/26/2021 CLINICAL DATA:  Shortness of breath. EXAM: PORTABLE CHEST 1 VIEW COMPARISON:  03/18/2021. FINDINGS: Low lung volumes with mildly increased diffuse interstitial opacities. No visible pleural effusions or pneumothorax. Right lung apex is partially obscured by overlapping neck/face. Stable cardiomediastinal silhouette. Calcific atherosclerosis of the aorta. No acute osseous abnormality. IMPRESSION: Low lung volumes with mildly increased diffuse interstitial opacities, potentially mild interstitial edema or atypical infection. Dedicated PA and lateral radiographs may be helpful if the  patient is  able. Electronically Signed   By: Margaretha Sheffield MD   On: 03/26/2021 17:39    Procedures Procedures   Medications Ordered in ED Medications  aspirin tablet 81 mg (has no administration in time range)  carvedilol (COREG) tablet 3.125 mg (has no administration in time range)  furosemide (LASIX) tablet 20 mg (has no administration in time range)  losartan (COZAAR) tablet 100 mg (has no administration in time range)  nitroGLYCERIN (NITROSTAT) SL tablet 0.4 mg (has no administration in time range)  sertraline (ZOLOFT) tablet 50 mg (has no administration in time range)  finasteride (PROSCAR) tablet 5 mg (has no administration in time range)  ferrous sulfate EC tablet 325 mg (has no administration in time range)  vitamin B-12 (CYANOCOBALAMIN) tablet 1,000 mcg (has no administration in time range)  ascorbic acid (VITAMIN C) tablet 500 mg (has no administration in time range)  albuterol (VENTOLIN HFA) 108 (90 Base) MCG/ACT inhaler 2 puff (has no administration in time range)  guaiFENesin-dextromethorphan (ROBITUSSIN DM) 100-10 MG/5ML syrup 10 mL (has no administration in time range)  predniSONE (DELTASONE) tablet 50 mg (has no administration in time range)  pantoprazole (PROTONIX) EC tablet 40 mg (has no administration in time range)  guaiFENesin (MUCINEX) 12 hr tablet 600 mg (has no administration in time range)  methylPREDNISolone sodium succinate (SOLU-MEDROL) 125 mg/2 mL injection 125 mg (has no administration in time range)  zinc sulfate capsule 220 mg (has no administration in time range)  ascorbic acid (VITAMIN C) tablet 500 mg (has no administration in time range)  sodium chloride flush (NS) 0.9 % injection 3 mL (has no administration in time range)  sodium chloride flush (NS) 0.9 % injection 3 mL (has no administration in time range)  sodium chloride flush (NS) 0.9 % injection 3 mL (has no administration in time range)  0.9 %  sodium chloride infusion (has no administration in time  range)  acetaminophen (TYLENOL) tablet 650 mg (has no administration in time range)    Or  acetaminophen (TYLENOL) suppository 650 mg (has no administration in time range)  traZODone (DESYREL) tablet 50 mg (has no administration in time range)  polyethylene glycol (MIRALAX / GLYCOLAX) packet 17 g (has no administration in time range)  bisacodyl (DULCOLAX) suppository 10 mg (has no administration in time range)  ondansetron (ZOFRAN) tablet 4 mg (has no administration in time range)    Or  ondansetron (ZOFRAN) injection 4 mg (has no administration in time range)  enoxaparin (LOVENOX) injection 40 mg (has no administration in time range)  albuterol (VENTOLIN HFA) 108 (90 Base) MCG/ACT inhaler 2 puff (2 puffs Inhalation Given 03/26/21 1737)  iohexol (OMNIPAQUE) 350 MG/ML injection 100 mL (100 mLs Intravenous Contrast Given 03/26/21 1945)    ED Course  I have reviewed the triage vital signs and the nursing notes.  Pertinent labs & imaging results that were available during my care of the patient were reviewed by me and considered in my medical decision making (see chart for details).    MDM Rules/Calculators/A&P                           Patient with worsening COVID-19.  He is now on 5 L nasal and his O2 sats are 91% he will be admitted by medicine Final Clinical Impression(s) / ED Diagnoses Final diagnoses:  Shortness of breath  Hypoxia    Rx / DC Orders ED Discharge Orders     None  Milton Ferguson, MD 03/27/21 0930

## 2021-03-26 NOTE — ED Triage Notes (Signed)
Patient from home with complaints of fever SHOB secondary to Covid. Patient supposed wear oxygen at home and was found with cannula off face and sats in the 80's.

## 2021-03-27 DIAGNOSIS — I10 Essential (primary) hypertension: Secondary | ICD-10-CM | POA: Diagnosis not present

## 2021-03-27 DIAGNOSIS — I480 Paroxysmal atrial fibrillation: Secondary | ICD-10-CM | POA: Diagnosis not present

## 2021-03-27 DIAGNOSIS — R0902 Hypoxemia: Secondary | ICD-10-CM

## 2021-03-27 DIAGNOSIS — U071 COVID-19: Secondary | ICD-10-CM | POA: Diagnosis not present

## 2021-03-27 DIAGNOSIS — R0602 Shortness of breath: Secondary | ICD-10-CM | POA: Diagnosis not present

## 2021-03-27 LAB — BASIC METABOLIC PANEL
Anion gap: 7 (ref 5–15)
BUN: 25 mg/dL — ABNORMAL HIGH (ref 8–23)
CO2: 22 mmol/L (ref 22–32)
Calcium: 8.4 mg/dL — ABNORMAL LOW (ref 8.9–10.3)
Chloride: 106 mmol/L (ref 98–111)
Creatinine, Ser: 0.74 mg/dL (ref 0.61–1.24)
GFR, Estimated: >60 mL/min (ref >60)
Glucose, Bld: 191 mg/dL — ABNORMAL HIGH (ref 70–99)
Potassium: 4.1 mmol/L (ref 3.5–5.1)
Sodium: 135 mmol/L (ref 135–145)

## 2021-03-27 LAB — GLUCOSE, CAPILLARY
Glucose-Capillary: 165 mg/dL — ABNORMAL HIGH (ref 70–99)
Glucose-Capillary: 183 mg/dL — ABNORMAL HIGH (ref 70–99)
Glucose-Capillary: 195 mg/dL — ABNORMAL HIGH (ref 70–99)
Glucose-Capillary: 228 mg/dL — ABNORMAL HIGH (ref 70–99)

## 2021-03-27 LAB — CBC
HCT: 42.7 % (ref 39.0–52.0)
Hemoglobin: 13.9 g/dL (ref 13.0–17.0)
MCH: 30.5 pg (ref 26.0–34.0)
MCHC: 32.6 g/dL (ref 30.0–36.0)
MCV: 93.6 fL (ref 80.0–100.0)
Platelets: 207 10*3/uL (ref 150–400)
RBC: 4.56 MIL/uL (ref 4.22–5.81)
RDW: 13 % (ref 11.5–15.5)
WBC: 13.9 10*3/uL — ABNORMAL HIGH (ref 4.0–10.5)
nRBC: 0 % (ref 0.0–0.2)

## 2021-03-27 MED ORDER — METHYLPREDNISOLONE SODIUM SUCC 125 MG IJ SOLR
60.0000 mg | Freq: Three times a day (TID) | INTRAMUSCULAR | Status: DC
  Administered 2021-03-27 – 2021-04-07 (×32): 60 mg via INTRAVENOUS
  Filled 2021-03-27 (×32): qty 2

## 2021-03-27 MED ORDER — METHYLPREDNISOLONE SODIUM SUCC 125 MG IJ SOLR: 60.0000 mg | Freq: Three times a day (TID) | INTRAMUSCULAR | Status: DC

## 2021-03-27 MED ORDER — ORAL CARE MOUTH RINSE
15.0000 mL | Freq: Two times a day (BID) | OROMUCOSAL | Status: DC
  Administered 2021-03-27 – 2021-04-07 (×23): 15 mL via OROMUCOSAL

## 2021-03-27 MED ORDER — TOCILIZUMAB 400 MG/20ML IV SOLN
8.0000 mg/kg | Freq: Once | INTRAVENOUS | Status: AC
  Administered 2021-03-27: 748 mg via INTRAVENOUS
  Filled 2021-03-27: qty 37.4

## 2021-03-27 NOTE — ED Notes (Signed)
O2 improved to 92% on 6L [place] after albuterol tx

## 2021-03-27 NOTE — ED Notes (Signed)
Pt c/o sob - albuterol given. O2 sat was 88% on 5L.

## 2021-03-27 NOTE — Progress Notes (Signed)
PROGRESS NOTE    Manuel Le  [id number] DOB: [birth year] DOA: 03/26/2021 PCP: Manuel Squibb, MD    Chief Complaint  Patient presents with   Shortness of Breath    Brief admission narrative:  As per H&P written by Dr. Denton Brick on a [place]  is a 87 y.o. male reformed smoker with past medical history relevant for chronic A. fib/CAD, HTN, BPH --Patient was recently admitted to the hospital from 03/18/2021 through 03/22/2021 for COVID-pneumonia -was treated with remdesivir and steroids--- was discharged home with home health after patient and family refused SNF rehab --Patient is unvaccinated -Patient returns with worsening hypoxia now requiring up to 5 L of oxygen via nasal cannula --CTA chest on 03/26/2021 with extensive worsening COVID PNA,  No acute pulmonary embolism -discussed with patient/family (HCPOA---Mr Manuel Le)--- - -BNP 68, troponin is not elevated, D-dimer is up to 1.55 from 0.554 days ago however CTA chest as noted above is without acute PE -WBC 16.8 in the setting of steroid use -Glucose is 299 in the setting of steroid use -Creatinine 0.87  Assessment & Plan: 1-Acute respiratory disease due to COVID-19 virus -Patient with positive hypoxia requiring 5 L nasal cannula supplementation -Still experiencing intermittent coughing spells, inability to speak in full sentences and positive tachypnea/showing desaturation with minimal activity. -Given images abnormalities at time of admission, increased inflammatory markers and concerns of worsening COVID infection patient will receive treatment with antibiotic, continue IV steroids, bronchodilators and supportive management -Prognosis is guarded. -Patient is non-vaccinated.  2-Atrial fibrillation ([place]) -Rate controlled -Chronically on aspirin -continue coreg  3-chronic diastolic heart failure -Stable and compensated -Continue to follow daily weights and strict I's and O -Continue Coreg and losartan -Last  ejection fraction 50 to 55% from echo 2019. -No crackles on exam; patient denies orthopnea. -Continue heart healthy diet  4-hyperglycemia -No prior history of diabetes -Probably associated to the use of his steroids -Continue sliding scale insulin and follow CBGs. -A1c ordered and pending.  5-GI prophylaxis/GERD -Continue PPI.  6-DNR/DNI -Palliative care has been consulted to pre-CERT further discussion about advance care planning -Discussed with healthcare POA on admission plan is to continue aggressive treatment without invasive intervention.  7-essential hypertension -Stable overall -Continue current antihypertensive regimen.  DVT prophylaxis: Lovenox Code Status: DNR/DNI Family Communication: POA updated over the phone (03/27/2021). Disposition:   Status is: Inpatient  Remains inpatient appropriate because:IV treatments appropriate due to intensity of illness or inability to take PO  Dispo: The patient is from: Home              Anticipated d/c is to: To be determined.              Patient currently is not medically stable to d/c.   Difficult to place patient No    Consultants:  None  Procedures:  See below for x-ray reports.  Antimicrobials:  None   Subjective: Unable to speak in full sentences, requiring 5 L of cannula supplementation; no chest pain, no nausea, no vomiting.  Patient denies orthopnea.  Nonproductive coughing spells appreciated.  Objective: Vitals:   03/27/21 0500 03/27/21 0630 03/27/21 0911 03/27/21 1000  BP: (!) 143/71 (!) 159/72 (!) 189/64   Pulse: 67 71 66   Resp: (!) 21 (!) 21    Temp:   97.9 F (36.6 C)   TempSrc:   Oral   SpO2: 90% 92% 92%   Weight:    93.4 kg  Height:    6' (1.829 m)  No intake or output data in the 24 hours ending 03/27/21 1402 Filed Weights   03/26/21 1644 03/27/21 1000  Weight: 97.1 kg 93.4 kg    Examination:  General exam: Currently afebrile; but expressed chills.  Ongoing nonproductive coughing  spells, difficulty speaking in full sentences, shortness of breath and feeling short winded with minimal activity.  Requiring 5 L high flow nasal cannula supplementation. Respiratory system: Positive rhonchi bilaterally; no wheezing or crackles.  Positive tachypnea appreciated with activity, no using accessory muscle. Cardiovascular system: Rate controlled, no rubs, no gallops, no JVD. Gastrointestinal system: Abdomen is nondistended, soft and nontender. No organomegaly or masses felt. Normal bowel sounds heard. Central nervous system: Alert and oriented. No focal neurological deficits. Extremities: No cyanosis or clubbing. Skin: No petechiae. Psychiatry: Mood & affect appropriate.     Data Reviewed: I have personally reviewed following labs and imaging studies  CBC: Recent Labs  Lab 03/21/21 0630 03/22/21 0625 03/26/21 1722 03/27/21 0715  WBC 11.5* 10.9* 16.8* 13.9*  NEUTROABS 10.3* 9.7* 15.9*  --   HGB 15.0 14.9 13.8 13.9  HCT 44.8 44.1 41.5 42.7  MCV 92.4 92.6 93.5 93.6  PLT 205 191 237 [id number]    Basic Metabolic Panel: Recent Labs  Lab 03/21/21 0630 03/22/21 0625 03/26/21 1722 03/27/21 0526  NA 140 138 134* 135  K 3.7 4.1 4.0 4.1  CL 105 104 104 106  CO2 '26 28 22 22  '$ GLUCOSE 158* 174* 299* 191*  BUN 23 26* 25* 25*  CREATININE 0.66 0.61 0.87 0.74  CALCIUM 8.8* 8.7* 8.4* 8.4*  MG 2.2 2.3  --   --   PHOS 3.0 3.2  --   --     GFR: Estimated Creatinine Clearance: 69.9 mL/min (by C-G formula based on SCr of 0.74 mg/dL).  Liver Function Tests: Recent Labs  Lab 03/21/21 0630 03/22/21 0625 03/26/21 1722  AST 29 33 37  ALT 26 37 31  ALKPHOS 42 44 49  BILITOT 0.7 0.9 0.8  PROT 7.0 6.9 6.8  ALBUMIN 3.3* 3.2* 3.1*    CBG: Recent Labs  Lab 03/26/21 2250 03/27/21 0918 03/27/21 1109  GLUCAP 217* 165* 183*    Recent Results (from the past 240 hour(s))  Resp Panel by RT-PCR (Flu A&B, Covid) Nasopharyngeal Swab     Status: Abnormal   Collection Time: 03/18/21  12:39 PM   Specimen: Nasopharyngeal Swab; Nasopharyngeal(NP) swabs in vial transport medium  Result Value Ref Range Status   SARS Coronavirus 2 by RT PCR POSITIVE (A) NEGATIVE Final    Comment: RESULT CALLED TO, READ BACK BY AND VERIFIED WITH:  DR [id number]/29/22 BY STEPHTR (NOTE) SARS-CoV-2 target nucleic acids are DETECTED.  The SARS-CoV-2 RNA is generally detectable in upper respiratory specimens during the acute phase of infection. Positive results are indicative of the presence of the identified virus, but do not rule out bacterial infection or co-infection with other pathogens not detected by the test. Clinical correlation with patient history and other diagnostic information is necessary to determine patient infection status. The expected result is Negative.  Fact Sheet for Patients: EntrepreneurPulse.com.au  Fact Sheet for Healthcare Providers: IncredibleEmployment.be  This test is not yet approved or cleared by the [place] FDA and  has been authorized for detection and/or diagnosis of SARS-CoV-2 by FDA under an Emergency Use Authorization (EUA).  This EUA will remain in effect (meaning this test can b e used) for the duration of  the COVID-19 declaration under Section 564(b)(1) of the Act,  21 U.S.C. section 360bbb-3(b)(1), unless the authorization is terminated or revoked sooner.     Influenza A by PCR NEGATIVE NEGATIVE Final   Influenza B by PCR NEGATIVE NEGATIVE Final    Comment: (NOTE) The Xpert Xpress SARS-CoV-2/FLU/RSV plus assay is intended as an aid in the diagnosis of influenza from Nasopharyngeal swab specimens and should not be used as a sole basis for treatment. Nasal washings and aspirates are unacceptable for Xpert Xpress SARS-CoV-2/FLU/RSV testing.  Fact Sheet for Patients: EntrepreneurPulse.com.au  Fact Sheet for Healthcare Providers: IncredibleEmployment.be  This  test is not yet approved or cleared by the [place] FDA and has been authorized for detection and/or diagnosis of SARS-CoV-2 by FDA under an Emergency Use Authorization (EUA). This EUA will remain in effect (meaning this test can be used) for the duration of the COVID-19 declaration under Section 564(b)(1) of the Act, 21 U.S.C. section 360bbb-3(b)(1), unless the authorization is terminated or revoked.  Performed at [facility], [address], [place], [place] [address]   Blood Culture (routine x 2)     Status: None   Collection Time: 03/18/21  4:02 PM   Specimen: Right Antecubital; Blood  Result Value Ref Range Status   Specimen Description RIGHT ANTECUBITAL  Final   Special Requests   Final    BOTTLES DRAWN AEROBIC AND ANAEROBIC Blood Culture adequate volume   Culture   Final    NO GROWTH 5 DAYS Performed at [facility], [address], [place], [place] [address]    Report Status 03/23/2021 FINAL  Final  Blood Culture (routine x 2)     Status: None   Collection Time: 03/18/21  4:03 PM   Specimen: BLOOD RIGHT ARM  Result Value Ref Range Status   Specimen Description BLOOD RIGHT ARM  Final   Special Requests   Final    BOTTLES DRAWN AEROBIC AND ANAEROBIC Blood Culture adequate volume   Culture   Final    NO GROWTH 5 DAYS Performed at [facility], [address] [place], [place] [address]    Report Status 03/23/2021 FINAL  Final     Radiology Studies: CT Angio Chest PE W and/or Wo Contrast  Result Date: 03/26/2021 CLINICAL DATA:  PE suspected, recent COVID, home oxygen EXAM: CT ANGIOGRAPHY CHEST WITH CONTRAST TECHNIQUE: Multidetector CT imaging of the chest was performed using the standard protocol during bolus administration of intravenous contrast. Multiplanar CT image reconstructions and MIPs were obtained to evaluate the vascular anatomy. CONTRAST:  175m OMNIPAQUE IOHEXOL 350 MG/ML SOLN COMPARISON:  03/18/2021 FINDINGS: Cardiovascular: Satisfactory opacification of the  pulmonary arteries to the segmental level. No evidence of pulmonary embolism. Cardiomegaly. Three-vessel coronary artery calcifications and/or stents. No pericardial effusion. Enlargement of the main pulmonary artery measuring up to 3.6 cm. Aortic atherosclerosis. Mediastinum/Nodes: No enlarged mediastinal, hilar, or axillary lymph nodes. Thyroid gland, trachea, and esophagus demonstrate no significant findings. Lungs/Pleura: Extensive peripheral ground-glass airspace opacity present bilaterally, predominantly in the dependent lungs, slightly worsened compared to prior examination. There is likely some degree of underlying scarring of the bilateral lung bases. Upper Abdomen: No acute abnormality. Musculoskeletal: No chest wall abnormality. No acute or significant osseous findings. Review of the MIP images confirms the above findings. IMPRESSION: 1. Negative examination for pulmonary embolism. 2. Extensive peripheral ground-glass airspace opacity present bilaterally, predominantly in the dependent lungs, slightly worsened compared to prior examination and in keeping with COVID-19 airspace disease. 3. Cardiomegaly and coronary artery disease. 4. Enlargement of the main pulmonary artery, as can be  seen in pulmonary hypertension. Aortic Atherosclerosis (ICD10-I70.0). Electronically Signed   By: Eddie Candle M.D.   On: 03/26/2021 20:05   DG Chest Port 1 View  Result Date: 03/26/2021 CLINICAL DATA:  Shortness of breath. EXAM: PORTABLE CHEST 1 VIEW COMPARISON:  03/18/2021. FINDINGS: Low lung volumes with mildly increased diffuse interstitial opacities. No visible pleural effusions or pneumothorax. Right lung apex is partially obscured by overlapping neck/face. Stable cardiomediastinal silhouette. Calcific atherosclerosis of the aorta. No acute osseous abnormality. IMPRESSION: Low lung volumes with mildly increased diffuse interstitial opacities, potentially mild interstitial edema or atypical infection. Dedicated PA and  lateral radiographs may be helpful if the patient is able. Electronically Signed   By: Margaretha Sheffield MD   On: 03/26/2021 17:39     Scheduled Meds:  ascorbic acid  500 mg Oral Daily   aspirin  81 mg Oral Daily   carvedilol  3.125 mg Oral BID   enoxaparin (LOVENOX) injection  40 mg Subcutaneous Q24H   ferrous sulfate  325 mg Oral Q breakfast   finasteride  5 mg Oral Daily   furosemide  20 mg Oral Once per day on Sun Tue Thu Sat   guaiFENesin  600 mg Oral BID   insulin aspart  0-5 Units Subcutaneous QHS   insulin aspart  0-6 Units Subcutaneous TID WC   losartan  100 mg Oral Daily   mouth rinse  15 mL Mouth Rinse BID   methylPREDNISolone (SOLU-MEDROL) injection  60 mg Intravenous Q8H   pantoprazole  40 mg Oral Daily   sertraline  50 mg Oral Daily   sodium chloride flush  3 mL Intravenous Q12H   sodium chloride flush  3 mL Intravenous Q12H   vitamin B-12  1,000 mcg Per NG tube Daily   zinc sulfate  220 mg Oral Daily   Continuous Infusions:  sodium chloride     tocilizumab (ACTEMRA) - non-COVID treatment       LOS: 1 day    Time spent: 35 minutes.   Barton Dubois, MD [facility]   To contact the attending provider between 7A-7P or the covering provider during after hours 7P-7A, please log into the web site [email or web address] and access using universal [place] password for that web site. If you do not have the password, please call the hospital operator.  03/27/2021, 2:02 PM

## 2021-03-28 DIAGNOSIS — R0602 Shortness of breath: Secondary | ICD-10-CM | POA: Diagnosis not present

## 2021-03-28 DIAGNOSIS — I10 Essential (primary) hypertension: Secondary | ICD-10-CM | POA: Diagnosis not present

## 2021-03-28 DIAGNOSIS — U071 COVID-19: Secondary | ICD-10-CM | POA: Diagnosis not present

## 2021-03-28 DIAGNOSIS — I480 Paroxysmal atrial fibrillation: Secondary | ICD-10-CM | POA: Diagnosis not present

## 2021-03-28 LAB — GLUCOSE, CAPILLARY
Glucose-Capillary: 182 mg/dL — ABNORMAL HIGH (ref 70–99)
Glucose-Capillary: 195 mg/dL — ABNORMAL HIGH (ref 70–99)
Glucose-Capillary: 288 mg/dL — ABNORMAL HIGH (ref 70–99)
Glucose-Capillary: 270 mg/dL — ABNORMAL HIGH (ref 70–99)

## 2021-03-28 NOTE — Progress Notes (Addendum)
Pt spo2 88% on 5lpm cann o2 increased to 6lpm RT will come back to assess pt spo2 may need to change to [place] HFNC

## 2021-03-28 NOTE — Plan of Care (Signed)
  Problem: Acute Rehab PT Goals(only PT should resolve) Goal: Pt will Roll Supine to Side Outcome: Progressing Flowsheets (Taken 03/28/2021 1359) Pt will Roll Supine to Side: with supervision Goal: Pt Will Go Supine/Side To Sit Outcome: Progressing Flowsheets (Taken 03/28/2021 1359) Pt will go Supine/Side to Sit:  with minimal assist  with HOB elevated Goal: Pt Will Go Sit To Supine/Side Outcome: Progressing Flowsheets (Taken 03/28/2021 1401) Pt will go Sit to Supine/Side:  with minimal assist  with HOB  elevated Goal: Patient Will Transfer Sit To/From Stand Outcome: Progressing Flowsheets (Taken 03/28/2021 1401) Patient will transfer sit to/from stand: with moderate assist Goal: Pt Will Transfer Bed To Chair/Chair To Bed Outcome: Progressing Flowsheets (Taken 03/28/2021 1401) Pt will Transfer Bed to Chair/Chair to Bed: with mod assist Goal: Pt Will Ambulate Outcome: Progressing Flowsheets (Taken 03/28/2021 1401) Pt will Ambulate:  10 feet  with moderate assist  with rolling walker   Pamala Hurry D. Hartnett-Rands, MS, PT Per [place] [phone] 03/28/2021

## 2021-03-28 NOTE — Progress Notes (Signed)
Pt spo2 90-91% on 6lpm cann humidity added to 02

## 2021-03-28 NOTE — Progress Notes (Signed)
PROGRESS NOTE    Manuel Le  [id number] DOB: 1926/12/03 DOA: 03/26/2021 PCP: Celene Squibb, MD    Chief Complaint  Patient presents with   Shortness of Breath    Brief admission narrative:  As per H&P written by Dr. Denton Brick on a [place]  is a 87 y.o. male reformed smoker with past medical history relevant for chronic A. fib/CAD, HTN, BPH --Patient was recently admitted to the hospital from 03/18/2021 through 03/22/2021 for COVID-pneumonia -was treated with remdesivir and steroids--- was discharged home with home health after patient and family refused SNF rehab --Patient is unvaccinated -Patient returns with worsening hypoxia now requiring up to 5 L of oxygen via nasal cannula --CTA chest on 03/26/2021 with extensive worsening COVID PNA,  No acute pulmonary embolism -discussed with patient/family (HCPOA---Mr Manuel Le)--- - -BNP 68, troponin is not elevated, D-dimer is up to 1.55 from 0.554 days ago however CTA chest as noted above is without acute PE -WBC 16.8 in the setting of steroid use -Glucose is 299 in the setting of steroid use -Creatinine 0.87  Assessment & Plan: 1-Acute respiratory disease due to COVID-19 virus -Patient with positive hypoxia requiring 5 L nasal cannula supplementation -Still experiencing intermittent coughing spells, inability to speak in full sentences and positive tachypnea/showing desaturation with minimal activity. -Given images abnormalities at time of admission, increased inflammatory markers and concerns of worsening COVID infection patient will received treatment with Amctera -will continue IV steroids, bronchodilators and supportive management -Prognosis is guarded. -Patient is non-vaccinated. -continue attempting to wean off oxygen supplementation and continue the use of flutter valve/IS.  2-Atrial fibrillation ([place]) -Rate controlled -Chronically on aspirin -continue coreg  3-chronic diastolic heart failure -Stable and  compensated -Continue to follow daily weights and strict I's and O -Continue Coreg and losartan -Last ejection fraction 50 to 55% from echo 2019. -No crackles on exam; patient denies orthopnea. -Continue heart healthy diet  4-hyperglycemia -No prior history of diabetes -Probably associated to the use of his steroids -Continue sliding scale insulin and follow CBGs. -A1c ordered and pending.  5-GI prophylaxis/GERD -Continue PPI.  6-DNR/DNI -Palliative care has been consulted to pre-CERT further discussion about advance care planning -Discussed with healthcare POA on admission plan is to continue aggressive treatment without invasive intervention.  7-essential hypertension -Stable overall -Continue current antihypertensive regimen.  DVT prophylaxis: Lovenox Code Status: DNR/DNI Family Communication: POA updated over the phone (03/27/2021).  No family at bedside during today's evaluation. Disposition:   Status is: Inpatient  Remains inpatient appropriate because:IV treatments appropriate due to intensity of illness or inability to take PO  Dispo: The patient is from: Home              Anticipated d/c is to: To be determined.              Patient currently is not medically stable to d/c.   Difficult to place patient No    Consultants:  None  Procedures:  See below for x-ray reports.  Antimicrobials:  None   Subjective: Patient feeling better; having less difficulty speaking in full sentences, still requiring 5 L nasal cannula supplementation and expressing shortness of breath with minimal exertion.  Ongoing nonproductive deep coughing spells appreciated.  Objective: Vitals:   03/27/21 1420 03/27/21 2024 03/28/21 0553 03/28/21 1310  BP: 117/61 (!) 156/64 (!) 168/79 (!) 161/74  Pulse:  75 74 65  Resp:  '18 18 20  '$ Temp: 97.8 F (36.6 C) 98.8 F (37.1 C) 98.4 F (  36.9 C)   TempSrc: Oral     SpO2:  (!) 88% 93% (!) 81%  Weight:      Height:        Intake/Output  Summary (Last 24 hours) at 03/28/2021 1706 Last data filed at 03/28/2021 0700 Gross per 24 hour  Intake --  Output 500 ml  Net -500 ml   Filed Weights   03/26/21 1644 03/27/21 1000  Weight: 97.1 kg 93.4 kg    Examination: General exam: Reports feeling better; still requiring 5 L nasal cannula supplementation and experiencing coughing spells.  No fever.  No using accessory muscles. Respiratory system: Positive scattered rhonchi; no wheezing.  No using accessory muscles. Cardiovascular system: No rubs, no gallops, no JVD. Gastrointestinal system: Abdomen is nondistended, soft and nontender. No organomegaly or masses felt. Normal bowel sounds heard. Central nervous system: Alert and oriented. No focal neurological deficits. Extremities: No Cyanosis, no clubbing. Skin: No petechiae. Psychiatry: Mood & affect appropriate.    Data Reviewed: I have personally reviewed following labs and imaging studies  CBC: Recent Labs  Lab 03/22/21 0625 03/26/21 1722 03/27/21 0715  WBC 10.9* 16.8* 13.9*  NEUTROABS 9.7* 15.9*  --   HGB 14.9 13.8 13.9  HCT 44.1 41.5 42.7  MCV 92.6 93.5 93.6  PLT 191 237 [id number]    Basic Metabolic Panel: Recent Labs  Lab 03/22/21 0625 03/26/21 1722 03/27/21 0526  NA 138 134* 135  K 4.1 4.0 4.1  CL 104 104 106  CO2 '28 22 22  '$ GLUCOSE 174* 299* 191*  BUN 26* 25* 25*  CREATININE 0.61 0.87 0.74  CALCIUM 8.7* 8.4* 8.4*  MG 2.3  --   --   PHOS 3.2  --   --     GFR: Estimated Creatinine Clearance: 69.9 mL/min (by C-G formula based on SCr of 0.74 mg/dL).  Liver Function Tests: Recent Labs  Lab 03/22/21 0625 03/26/21 1722  AST 33 37  ALT 37 31  ALKPHOS 44 49  BILITOT 0.9 0.8  PROT 6.9 6.8  ALBUMIN 3.2* 3.1*    CBG: Recent Labs  Lab 03/27/21 1613 03/27/21 2108 03/28/21 0731 03/28/21 1132 03/28/21 1610  GLUCAP 195* 228* 182* 195* 288*    No results found for this or any previous visit (from the past 240 hour(s)).    Radiology Studies: CT  Angio Chest PE W and/or Wo Contrast  Result Date: 03/26/2021 CLINICAL DATA:  PE suspected, recent COVID, home oxygen EXAM: CT ANGIOGRAPHY CHEST WITH CONTRAST TECHNIQUE: Multidetector CT imaging of the chest was performed using the standard protocol during bolus administration of intravenous contrast. Multiplanar CT image reconstructions and MIPs were obtained to evaluate the vascular anatomy. CONTRAST:  185m OMNIPAQUE IOHEXOL 350 MG/ML SOLN COMPARISON:  03/18/2021 FINDINGS: Cardiovascular: Satisfactory opacification of the pulmonary arteries to the segmental level. No evidence of pulmonary embolism. Cardiomegaly. Three-vessel coronary artery calcifications and/or stents. No pericardial effusion. Enlargement of the main pulmonary artery measuring up to 3.6 cm. Aortic atherosclerosis. Mediastinum/Nodes: No enlarged mediastinal, hilar, or axillary lymph nodes. Thyroid gland, trachea, and esophagus demonstrate no significant findings. Lungs/Pleura: Extensive peripheral ground-glass airspace opacity present bilaterally, predominantly in the dependent lungs, slightly worsened compared to prior examination. There is likely some degree of underlying scarring of the bilateral lung bases. Upper Abdomen: No acute abnormality. Musculoskeletal: No chest wall abnormality. No acute or significant osseous findings. Review of the MIP images confirms the above findings. IMPRESSION: 1. Negative examination for pulmonary embolism. 2. Extensive peripheral ground-glass airspace opacity present bilaterally,  predominantly in the dependent lungs, slightly worsened compared to prior examination and in keeping with COVID-19 airspace disease. 3. Cardiomegaly and coronary artery disease. 4. Enlargement of the main pulmonary artery, as can be seen in pulmonary hypertension. Aortic Atherosclerosis (ICD10-I70.0). Electronically Signed   By: Eddie Candle M.D.   On: 03/26/2021 20:05   DG Chest Port 1 View  Result Date: 03/26/2021 CLINICAL DATA:   Shortness of breath. EXAM: PORTABLE CHEST 1 VIEW COMPARISON:  03/18/2021. FINDINGS: Low lung volumes with mildly increased diffuse interstitial opacities. No visible pleural effusions or pneumothorax. Right lung apex is partially obscured by overlapping neck/face. Stable cardiomediastinal silhouette. Calcific atherosclerosis of the aorta. No acute osseous abnormality. IMPRESSION: Low lung volumes with mildly increased diffuse interstitial opacities, potentially mild interstitial edema or atypical infection. Dedicated PA and lateral radiographs may be helpful if the patient is able. Electronically Signed   By: Margaretha Sheffield MD   On: 03/26/2021 17:39     Scheduled Meds:  ascorbic acid  500 mg Oral Daily   aspirin  81 mg Oral Daily   carvedilol  3.125 mg Oral BID   enoxaparin (LOVENOX) injection  40 mg Subcutaneous Q24H   ferrous sulfate  325 mg Oral Q breakfast   finasteride  5 mg Oral Daily   furosemide  20 mg Oral Once per day on Sun Tue Thu Sat   guaiFENesin  600 mg Oral BID   insulin aspart  0-5 Units Subcutaneous QHS   insulin aspart  0-6 Units Subcutaneous TID WC   losartan  100 mg Oral Daily   mouth rinse  15 mL Mouth Rinse BID   methylPREDNISolone (SOLU-MEDROL) injection  60 mg Intravenous Q8H   pantoprazole  40 mg Oral Daily   sertraline  50 mg Oral Daily   sodium chloride flush  3 mL Intravenous Q12H   sodium chloride flush  3 mL Intravenous Q12H   vitamin B-12  1,000 mcg Per NG tube Daily   zinc sulfate  220 mg Oral Daily   Continuous Infusions:  sodium chloride       LOS: 2 days    Time spent: 35 minutes.   Barton Dubois, MD [facility]   To contact the attending provider between 7A-7P or the covering provider during after hours 7P-7A, please log into the web site [email or web address] and access using universal [place] password for that web site. If you do not have the password, please call the hospital operator.  03/28/2021, 5:06 PM

## 2021-03-28 NOTE — Progress Notes (Signed)
IS and Flutter instructed and given to patient. Patient had good effort but assistance will be needed

## 2021-03-28 NOTE — Evaluation (Signed)
Physical Therapy Evaluation Patient Details Name: Manuel Le MRN: [id number] DOB: 07-14-1927 Today's Date: 03/28/2021   History of Present Illness  Manuel Le  is a 87 y.o. male reformed smoker with past medical history relevant for chronic A. fib/CAD, HTN, BPH --Patient was recently admitted to the hospital from 03/18/2021 through 03/22/2021 for COVID-pneumonia -was treated with remdesivir and steroids--- was discharged home with home health after patient and family refused SNF rehab.   Clinical Impression  Patient agreeable to participating in PT evaluation today. Patient requiring significant assistance for bed mobility and sit to stand transfers. Patient is deconditioned and fatigues quickly. Patient was also having difficulty with choking on food particles from his lunch which limited our physical activity today. Patient refused to attempt to ambulated.  Patient would continue to benefit from skilled physical therapy in current environment and next venue to continue return to prior function and increase strength, endurance, balance, coordination, and functional mobility and gait skills.      Follow Up Recommendations SNF;Supervision/Assistance - 24 hour    Equipment Recommendations       Recommendations for Other Services Speech consult     Precautions / Restrictions Precautions Precautions: Fall Restrictions Weight Bearing Restrictions: No      Mobility  Bed Mobility Overal bed mobility: Needs Assistance Bed Mobility: Supine to Sit;Sit to Supine     Supine to sit: Mod assist Sit to supine: Mod assist   General bed mobility comments: assist for trunk and lower extremities    Transfers Overall transfer level: Needs assistance Equipment used: Rolling walker (2 wheeled) Transfers: Sit to/from Stand;Anterior-Posterior Transfer;Lateral/Scoot Transfers Sit to Stand: Max Designer, television/film set transfers: Min assist  Lateral/Scoot Transfers: Min assist     Ambulation/Gait             General Gait Details: not attempted; patient refused  Stairs            Wheelchair Mobility    Modified Rankin (Stroke Patients Only)       Balance Overall balance assessment: Needs assistance Sitting-balance support: Feet supported;No upper extremity supported Sitting balance-Leahy Scale: Fair Sitting balance - Comments: fair/good seated at EOB   Standing balance support: During functional activity;Bilateral upper extremity supported Standing balance-Leahy Scale: Poor Standing balance comment: using RW                             Pertinent Vitals/Pain Pain Assessment: No/denies pain    Home Living Family/patient expects to be discharged to:: Private residence Living Arrangements: Other relatives               Additional Comments: patient unable to provide information at time of PT evaluation    Prior Function           Comments: admission 03/21/2021 noted patient lived alone.     Hand Dominance        Extremity/Trunk Assessment   Upper Extremity Assessment Upper Extremity Assessment: Generalized weakness    Lower Extremity Assessment Lower Extremity Assessment: Generalized weakness    Cervical / Trunk Assessment Cervical / Trunk Assessment: Kyphotic  Communication      Cognition Arousal/Alertness: Awake/alert Behavior During Therapy: Flat affect Overall Cognitive Status: No family/caregiver present to determine baseline cognitive functioning  General Comments      Exercises     Assessment/Plan    PT Assessment Patient needs continued PT services  PT Problem List Decreased strength;Decreased mobility;Decreased balance;Decreased knowledge of use of DME;Decreased activity tolerance;Decreased cognition       PT Treatment Interventions DME instruction;Therapeutic exercise;Gait training;Balance training;Neuromuscular  re-education;Functional mobility training;Cognitive remediation;Patient/family education;Therapeutic activities    PT Goals (Current goals can be found in the Care Plan section)  Acute Rehab PT Goals Patient Stated Goal: return home with family to assist PT Goal Formulation: With patient Time For Goal Achievement: 04/11/21 Potential to Achieve Goals: Fair    Frequency Min 3X/week   Barriers to discharge           AM-PAC PT "6 Clicks" Mobility  Outcome Measure Help needed turning from your back to your side while in a flat bed without using bedrails?: A Little Help needed moving from lying on your back to sitting on the side of a flat bed without using bedrails?: A Lot Help needed moving to and from a bed to a chair (including a wheelchair)?: A Lot Help needed standing up from a chair using your arms (e.g., wheelchair or bedside chair)?: A Lot Help needed to walk in hospital room?: Total Help needed climbing 3-5 steps with a railing? : Total 6 Click Score: 11    End of Session Equipment Utilized During Treatment: Gait belt;Oxygen Activity Tolerance: Patient limited by fatigue Patient left: in bed;with call bell/phone within reach Nurse Communication: Mobility status PT Visit Diagnosis: Unsteadiness on feet (R26.81);Other abnormalities of gait and mobility (R26.89);Muscle weakness (generalized) (M62.81)    Time: [id number] PT Time Calculation (min) (ACUTE ONLY): 30 min   Charges:   PT Evaluation $PT Eval Moderate Complexity: 1 Mod PT Treatments $Therapeutic Activity: 8-22 mins        Floria Raveling. Hartnett-Rands, MS, PT Per [place] [phone]  Pamala Hurry  Hartnett-Rands 03/28/2021, 1:57 PM

## 2021-03-29 DIAGNOSIS — Z7189 Other specified counseling: Secondary | ICD-10-CM

## 2021-03-29 DIAGNOSIS — Z515 Encounter for palliative care: Secondary | ICD-10-CM

## 2021-03-29 DIAGNOSIS — R0602 Shortness of breath: Secondary | ICD-10-CM | POA: Diagnosis not present

## 2021-03-29 DIAGNOSIS — I10 Essential (primary) hypertension: Secondary | ICD-10-CM | POA: Diagnosis not present

## 2021-03-29 DIAGNOSIS — U071 COVID-19: Secondary | ICD-10-CM | POA: Diagnosis not present

## 2021-03-29 DIAGNOSIS — J069 Acute upper respiratory infection, unspecified: Secondary | ICD-10-CM | POA: Diagnosis not present

## 2021-03-29 DIAGNOSIS — I480 Paroxysmal atrial fibrillation: Secondary | ICD-10-CM | POA: Diagnosis not present

## 2021-03-29 LAB — GLUCOSE, CAPILLARY
Glucose-Capillary: 180 mg/dL — ABNORMAL HIGH (ref 70–99)
Glucose-Capillary: 190 mg/dL — ABNORMAL HIGH (ref 70–99)
Glucose-Capillary: 187 mg/dL — ABNORMAL HIGH (ref 70–99)
Glucose-Capillary: 186 mg/dL — ABNORMAL HIGH (ref 70–99)

## 2021-03-29 LAB — CBC
HCT: 45.2 % (ref 39.0–52.0)
Hemoglobin: 15.1 g/dL (ref 13.0–17.0)
MCH: 31.3 pg (ref 26.0–34.0)
MCHC: 33.4 g/dL (ref 30.0–36.0)
MCV: 93.8 fL (ref 80.0–100.0)
Platelets: 204 10*3/uL (ref 150–400)
RBC: 4.82 MIL/uL (ref 4.22–5.81)
RDW: 13.2 % (ref 11.5–15.5)
WBC: 12.3 10*3/uL — ABNORMAL HIGH (ref 4.0–10.5)
nRBC: 0 % (ref 0.0–0.2)

## 2021-03-29 LAB — COMPREHENSIVE METABOLIC PANEL
ALT: 31 U/L (ref 0–44)
AST: 28 U/L (ref 15–41)
Albumin: 3 g/dL — ABNORMAL LOW (ref 3.5–5.0)
Alkaline Phosphatase: 54 U/L (ref 38–126)
Anion gap: 9 (ref 5–15)
BUN: 34 mg/dL — ABNORMAL HIGH (ref 8–23)
CO2: 23 mmol/L (ref 22–32)
Calcium: 9.1 mg/dL (ref 8.9–10.3)
Chloride: 108 mmol/L (ref 98–111)
Creatinine, Ser: 0.83 mg/dL (ref 0.61–1.24)
GFR, Estimated: >60 mL/min (ref >60)
Glucose, Bld: 178 mg/dL — ABNORMAL HIGH (ref 70–99)
Potassium: 4.3 mmol/L (ref 3.5–5.1)
Sodium: 140 mmol/L (ref 135–145)
Total Bilirubin: 1.1 mg/dL (ref 0.3–1.2)
Total Protein: 6.6 g/dL (ref 6.5–8.1)

## 2021-03-29 LAB — C-REACTIVE PROTEIN: CRP: 2.5 mg/dL — ABNORMAL HIGH (ref <1.0)

## 2021-03-29 LAB — MAGNESIUM: Magnesium: 2.6 mg/dL — ABNORMAL HIGH (ref 1.7–2.4)

## 2021-03-29 MED ORDER — INSULIN DETEMIR 100 UNIT/ML ~~LOC~~ SOLN
5.0000 [IU] | Freq: Every day | SUBCUTANEOUS | Status: DC
  Administered 2021-03-29 – 2021-04-06 (×9): 5 [IU] via SUBCUTANEOUS
  Filled 2021-03-29 (×10): qty 0.05

## 2021-03-29 NOTE — Progress Notes (Signed)
When RT entered room pt had o2 out of his nose and spo2 61% on room air o2 back in spo2 increased to 86% on 6lpm cann changed o2 to 9lpm salter HFNC and gave pt 2 puffs of Albuterol inhaler spo2 increased to 94% nurse informed

## 2021-03-29 NOTE — TOC Progression Note (Signed)
Transition of Care [facility]) - Progression Note    Patient Details  Name: Manuel Le MRN: [id number] Date of Birth: 08-05-1927  Transition of Care [facility]) CM/SW Contact  Natasha Bence, LCSW Phone Number: 03/29/2021, 5:15 PM  Clinical Narrative:    CSW notified by PT that patient continues to decline SNF. Patient's son reported that patient has hospital bed at home. TOC to follow.        Expected Discharge Plan and Services                                                 Social Determinants of Health (SDOH) Interventions    Readmission Risk Interventions Readmission Risk Prevention Plan 03/21/2021  Transportation Screening Complete  PCP or Specialist Appt within 3-5 Days Complete  HRI or [place] Complete  Social Work Consult for [place] Planning/Counseling Complete  Palliative Care Screening Not Applicable  Medication Review Press photographer) Complete  Some recent data might be hidden

## 2021-03-29 NOTE — Progress Notes (Signed)
PROGRESS NOTE    Manuel Le  [id number] DOB: 01/15/1927 DOA: 03/26/2021 PCP: Celene Squibb, MD    Chief Complaint  Patient presents with   Shortness of Breath    Brief admission narrative:  As per H&P written by Dr. Denton Brick on a [place]  is a 87 y.o. male reformed smoker with past medical history relevant for chronic A. fib/CAD, HTN, BPH --Patient was recently admitted to the hospital from 03/18/2021 through 03/22/2021 for COVID-pneumonia -was treated with remdesivir and steroids--- was discharged home with home health after patient and family refused SNF rehab --Patient is unvaccinated -Patient returns with worsening hypoxia now requiring up to 5 L of oxygen via nasal cannula --CTA chest on 03/26/2021 with extensive worsening COVID PNA,  No acute pulmonary embolism -discussed with patient/family (HCPOA---Mr Manuel Le)--- - -BNP 68, troponin is not elevated, D-dimer is up to 1.55 from 0.554 days ago however CTA chest as noted above is without acute PE -WBC 16.8 in the setting of steroid use -Glucose is 299 in the setting of steroid use -Creatinine 0.87  Assessment & Plan: 1-Acute respiratory disease due to COVID-19 virus -Patient with positive hypoxia requiring 5 L nasal cannula supplementation -Still experiencing intermittent coughing spells, inability to speak in full sentences and positive tachypnea/showing desaturation with minimal activity. -Given images abnormalities at time of admission, increased inflammatory markers and concerns of worsening COVID infection patient will received treatment with Amctera -will continue IV steroids, bronchodilators and supportive management -Prognosis is guarded. -Patient is non-vaccinated. -continue attempting to wean off oxygen supplementation and continue the use of flutter valve and IS.  2-Atrial fibrillation ([place]) -Rate controlled -Chronically on aspirin -continue coreg  3-chronic diastolic heart failure -Stable and  compensated -Continue to follow daily weights and strict I's and O -Continue Coreg and losartan -Last ejection fraction 50 to 55% from echo 2019. -No crackles on exam; patient denies orthopnea. -Continue heart healthy diet  4-hyperglycemia -No prior history of diabetes -Probably associated to the use of his steroids -Continue sliding scale insulin and follow CBGs. -A1c ordered and pending.  5-GI prophylaxis/GERD -Continue PPI.  6-DNR/DNI -Palliative care has been consulted to pre-CERT further discussion about advance care planning -Discussed with healthcare POA on admission plan is to continue aggressive treatment without invasive intervention.  7-essential hypertension -Stable overall -Continue current antihypertensive regimen.  DVT prophylaxis: Lovenox Code Status: DNR/DNI Family Communication: No family at bedside; Next of skin Manuel Le has been updated by our palliative care service. Disposition:   Status is: Inpatient  Remains inpatient appropriate because:IV treatments appropriate due to intensity of illness or inability to take PO  Dispo: The patient is from: Home              Anticipated d/c is to: To be determined.              Patient currently is not medically stable to d/c.   Difficult to place patient No    Consultants:  None  Procedures:  See below for x-ray reports.  Antimicrobials:  None   Subjective: Still short of breath with activity; requiring 5 L nasal cannula supplementation, no chest pain, no nausea, no vomiting.  Chronically ill, weak and deconditioned.  Objective: Vitals:   03/28/21 2233 03/29/21 0412 03/29/21 0852 03/29/21 1329  BP:  (!) 160/85 140/65 135/68  Pulse:  71 76 71  Resp:  18  20  Temp:  98.1 F (36.7 C)  97.9 F (36.6 C)  TempSrc:  Oral  Oral  SpO2: 90% (!) 89% 100% (!) 86%  Weight:      Height:        Intake/Output Summary (Last 24 hours) at 03/29/2021 1837 Last data filed at 03/29/2021 1200 Gross per 24  hour  Intake 480 ml  Output 800 ml  Net -320 ml   Filed Weights   03/26/21 1644 03/27/21 1000  Weight: 97.1 kg 93.4 kg    Examination: General exam: Following commands appropriately; no fever, no nausea, no vomiting.  Reports feeling okay, tired, deconditioned.  Still with ongoing coughing spells, shortness of breath with minimal exertion requiring 5 L Account supplementation to keep O2 sat above 89-90%. Respiratory system: Diffuse rhonchi bilaterally; no using accessory muscle.  Mild tachypnea with exertion appreciated.  No wheezing. Cardiovascular system:Rate controlled. No rubs or gallops; no JVD. Gastrointestinal system: Abdomen is nondistended, soft and nontender. No organomegaly or masses felt. Normal bowel sounds heard. Central nervous system: No new focal neurological deficits appreciated. Extremities: No cyanosis or clubbing. Skin: No petechiae. Psychiatry: Mood stable; flat affect.   Psychiatry: Mood & affect appropriate.    Data Reviewed: I have personally reviewed following labs and imaging studies  CBC: Recent Labs  Lab 03/26/21 1722 03/27/21 0715 03/29/21 0624  WBC 16.8* 13.9* 12.3*  NEUTROABS 15.9*  --   --   HGB 13.8 13.9 15.1  HCT 41.5 42.7 45.2  MCV 93.5 93.6 93.8  PLT 237 207 0000000    Basic Metabolic Panel: Recent Labs  Lab 03/26/21 1722 03/27/21 0526 03/29/21 0624  NA 134* 135 140  K 4.0 4.1 4.3  CL 104 106 108  CO2 '22 22 23  '$ GLUCOSE 299* 191* 178*  BUN 25* 25* 34*  CREATININE 0.87 0.74 0.83  CALCIUM 8.4* 8.4* 9.1  MG  --   --  2.6*    GFR: Estimated Creatinine Clearance: 67.4 mL/min (by C-G formula based on SCr of 0.83 mg/dL).  Liver Function Tests: Recent Labs  Lab 03/26/21 1722 03/29/21 0624  AST 37 28  ALT 31 31  ALKPHOS 49 54  BILITOT 0.8 1.1  PROT 6.8 6.6  ALBUMIN 3.1* 3.0*    CBG: Recent Labs  Lab 03/28/21 1610 03/28/21 2022 03/29/21 0724 03/29/21 1111 03/29/21 1603  GLUCAP 288* 270* 180* 190* 187*    No  results found for this or any previous visit (from the past 240 hour(s)).    Radiology Studies: No results found.   Scheduled Meds:  ascorbic acid  500 mg Oral Daily   aspirin  81 mg Oral Daily   carvedilol  3.125 mg Oral BID   enoxaparin (LOVENOX) injection  40 mg Subcutaneous Q24H   ferrous sulfate  325 mg Oral Q breakfast   finasteride  5 mg Oral Daily   furosemide  20 mg Oral Once per day on Sun Tue Thu Sat   guaiFENesin  600 mg Oral BID   insulin aspart  0-5 Units Subcutaneous QHS   insulin aspart  0-6 Units Subcutaneous TID WC   insulin detemir  5 Units Subcutaneous QHS   losartan  100 mg Oral Daily   mouth rinse  15 mL Mouth Rinse BID   methylPREDNISolone (SOLU-MEDROL) injection  60 mg Intravenous Q8H   pantoprazole  40 mg Oral Daily   sertraline  50 mg Oral Daily   sodium chloride flush  3 mL Intravenous Q12H   sodium chloride flush  3 mL Intravenous Q12H   vitamin B-12  1,000 mcg Per NG tube Daily  zinc sulfate  220 mg Oral Daily   Continuous Infusions:  sodium chloride       LOS: 3 days    Time spent: 35 minutes.   Barton Dubois, MD [facility]   To contact the attending provider between 7A-7P or the covering provider during after hours 7P-7A, please log into the web site [email or web address] and access using universal [place] password for that web site. If you do not have the password, please call the hospital operator.  03/29/2021, 6:37 PM

## 2021-03-29 NOTE — Consult Note (Signed)
Consultation Note Date: 03/29/2021   Patient Name: Manuel Le  DOB: 10-09-1926  MRN: [id number]  Age / Sex: 87 y.o., male  PCP: Celene Squibb, MD Referring Physician: Barton Dubois, MD  Reason for Consultation: Establishing goals of care  HPI/Patient Profile: 87 y.o. male  with past medical history of Covid pneumonia (discharged on 8/2 to home), a- fib, HTN, BPH  admitted on 03/26/2021 with worsening Covid pneumonia. He is currently requiring 5-6 L oxygen. Palliative medicine consulted for advanced care planning.   Clinical Assessment and Goals of Care: Patient awake and alert. States he feels better than he did before. He is feeding himself lunch. He is not cognitively able to engage in goals of care discussion.  Reviewed case with Dr. Dyann Kief- on prior discharge SNF had been recommended- however, family chose to take patient home and see how he progressed. Patient with quick return to hospital.  I called and spoke with patient's contact Manuel Le. Patient was living at home with multiple family members who are caring for him. He is not ambulatory at baseline.  Family provides assistance with all of his ADL's. Manuel Le shares that his decrease in ambulation was somewhat of a blessing as [name] would frequently get up at nights and wonder and they were concerned he would fall.  We discussed the concerns regarding how patient had deteriorated so quickly. While he is stable now, he is still requiring high amounts of oxygen. His recovery is going to be difficult and prolonged and his functional state is not going to improve.  Advanced care planning was discussed- options for SNF, home with [facility] and Palliative, and home with Hospice were presented. I also questioned if patient went home or to SNF and declined again- what would preferences be for returning to hospital vs comfort and care and allowing for natural dying  process be?  Manuel Le was thoughtful- noting he was uncertain of what plan would be if patient had future decline. His preference would be to bring patient home with his current supports- but he is also open to possible SNF placement if it is recommended.  Manuel Le would like to continue current care and watch for patient's progress vs decline. He requests further discussion on Thursday.     Primary Decision Maker NEXT OF KIN- Manuel Le    SUMMARY OF RECOMMENDATIONS -Continue current scope -Recommend outpatient Palliative vs hospice at discharge- will continue discussion with Manuel Le on Thursday    Code Status/Advance Care Planning: DNR   Prognosis:   Unable to determine  Discharge Planning: To Be Determined  Primary Diagnoses: Present on Admission:  Acute respiratory disease due to COVID-19 virus  HTN (hypertension)  Acute on chronic respiratory failure with hypoxia ([place])  Atrial fibrillation ([place])   I have reviewed the medical record, interviewed the patient and family, and examined the patient. The following aspects are pertinent.  Past Medical History:  Diagnosis Date   Arthritis    OSTEO   Atrial fibrillation and flutter ([place])    Bladder  cancer [facility])    Coronary artery disease    Dysrhythmia    Exertional angina ([place]) 07/08/2014   H/O urinary frequency    Hematuria    Hypertension    Iron deficiency anemia    Kidney stones    hx of    Mild obstructive sleep apnea    no cpap   S/P coronary artery stent placement 07/24/2014   Skin cancer    Weakness    Social History   Socioeconomic History   Marital status: Widowed    Spouse name: Not on file   Number of children: Not on file   Years of education: Not on file   Highest education level: Not on file  Occupational History   Not on file  Tobacco Use   Smoking status: Former    Packs/day: 2.00    Years: 20.00    Pack years: 40.00    Types: Cigarettes, Cigars, Pipe    Quit date: 08/21/1968    Years  since quitting: 52.6   Smokeless tobacco: Never   Tobacco comments:    quit smoking in 1980  Substance and Sexual Activity   Alcohol use: No    Alcohol/week: 0.0 standard drinks   Drug use: No   Sexual activity: Not on file  Other Topics Concern   Not on file  Social History Narrative   Not on file   Social Determinants of Health   Financial Resource Strain: Not on file  Food Insecurity: Not on file  Transportation Needs: Not on file  Physical Activity: Not on file  Stress: Not on file  Social Connections: Not on file   Scheduled Meds:  ascorbic acid  500 mg Oral Daily   aspirin  81 mg Oral Daily   carvedilol  3.125 mg Oral BID   enoxaparin (LOVENOX) injection  40 mg Subcutaneous Q24H   ferrous sulfate  325 mg Oral Q breakfast   finasteride  5 mg Oral Daily   furosemide  20 mg Oral Once per day on Sun Tue Thu Sat   guaiFENesin  600 mg Oral BID   insulin aspart  0-5 Units Subcutaneous QHS   insulin aspart  0-6 Units Subcutaneous TID WC   insulin detemir  5 Units Subcutaneous QHS   losartan  100 mg Oral Daily   mouth rinse  15 mL Mouth Rinse BID   methylPREDNISolone (SOLU-MEDROL) injection  60 mg Intravenous Q8H   pantoprazole  40 mg Oral Daily   sertraline  50 mg Oral Daily   sodium chloride flush  3 mL Intravenous Q12H   sodium chloride flush  3 mL Intravenous Q12H   vitamin B-12  1,000 mcg Per NG tube Daily   zinc sulfate  220 mg Oral Daily   Continuous Infusions:  sodium chloride     PRN Meds:.sodium chloride, acetaminophen **OR** acetaminophen, albuterol, bisacodyl, guaiFENesin-dextromethorphan, nitroGLYCERIN, ondansetron **OR** ondansetron (ZOFRAN) IV, polyethylene glycol, sodium chloride flush, traZODone Medications Prior to Admission:  Prior to Admission medications   Medication Sig Start Date End Date Taking? Authorizing Provider  albuterol (VENTOLIN HFA) 108 (90 Base) MCG/ACT inhaler Inhale 2 puffs into the lungs every 4 (four) hours as needed for shortness  of breath or wheezing. 01/26/21  Yes [provider]  ascorbic acid (VITAMIN C) 500 MG tablet Take 500 mg by mouth daily.   Yes [provider]  aspirin 81 MG tablet Take 1 tablet (81 mg total) by mouth daily. 09/09/18  Yes Herminio Commons, MD  carvedilol (COREG) 3.125 MG tablet Take 3.125 mg by mouth 2 (two) times daily. 05/17/20  Yes [provider]  Cholecalciferol (VITAMIN D3) 25 MCG (1000 UT) CAPS Take 1 capsule by mouth daily.   Yes [provider]  ferrous sulfate 325 (65 FE) MG EC tablet Take 325 mg by mouth daily with breakfast.   Yes [provider]  finasteride (PROSCAR) 5 MG tablet Take 5 mg by mouth daily.   Yes [provider]  furosemide (LASIX) 20 MG tablet Take 20 mg by mouth See admin instructions. 09/29/20  Yes [provider]  guaiFENesin-dextromethorphan (ROBITUSSIN DM) 100-10 MG/5ML syrup Take 10 mLs by mouth every 4 (four) hours as needed for cough. 03/21/21  Yes Shah, Pratik D, DO  losartan (COZAAR) 100 MG tablet Take 100 mg by mouth daily.   Yes [provider]  Melatonin 3 MG CAPS Take 1 capsule by mouth at bedtime. Take with 5 mg   Yes [provider]  melatonin 5 MG TABS Take 5 mg by mouth at bedtime. Take with 3 mg   Yes [provider]  Misc Natural Products (OSTEO BI-FLEX ADV JOINT SHIELD) TABS Take 1 tablet by mouth daily.    Yes [provider]  Multiple Vitamins-Minerals (CENTRUM SILVER ADULT 50+) TABS Take 1 tablet by mouth every morning.   Yes [provider]  Omega-3 Fatty Acids (FISH OIL) 1000 MG CAPS Take 1 capsule by mouth daily.   Yes [provider]  potassium chloride (KLOR-CON) 10 MEQ tablet Take 10 mEq by mouth See admin instructions. 09/29/20  Yes [provider]  sertraline (ZOLOFT) 50 MG tablet Take 50 mg by mouth daily. 06/30/19  Yes [provider]  nitroGLYCERIN (NITROSTAT) 0.4 MG SL tablet Place 1 tablet (0.4 mg total)  under the tongue every 5 (five) minutes as needed for chest pain. 07/03/14   Herminio Commons, MD  vitamin B-12 (CYANOCOBALAMIN) 1000 MCG tablet 1 tablet (1,000 mcg total) by Per NG tube route daily. Patient not taking: Reported on 03/28/2021 09/15/15   Izora Gala, MD   No Known Allergies Review of Systems  Physical Exam  Vital Signs: BP 135/68 (BP Location: Right Arm)   Pulse 71   Temp 97.9 F (36.6 C) (Oral)   Resp 20   Ht 6' (1.829 m)   Wt 93.4 kg   SpO2 (!) 86%   BMI 27.93 kg/m  Pain Scale: 0-10   Pain Score: 0-No pain   SpO2: SpO2: (!) 86 % O2 Device:SpO2: (!) 86 % O2 Flow Rate: .O2 Flow Rate (L/min): 5 L/min  IO: Intake/output summary:  Intake/Output Summary (Last 24 hours) at 03/29/2021 1349 Last data filed at 03/29/2021 0900 Gross per 24 hour  Intake 480 ml  Output 1250 ml  Net -770 ml    LBM: Last BM Date: 03/28/21 Baseline Weight: Weight: 97.1 kg Most recent weight: Weight: 93.4 kg     Palliative Assessment/Data: PPS: 30%     Thank you for this consult. Palliative medicine will continue to follow and assist as needed.   Time In: 1226 Time Out: 1348 Time Total: 82 mins Greater than 50%  of this time was spent counseling and coordinating care related to the above assessment and plan.  Signed by: Mariana Kaufman, AGNP-C Palliative Medicine    Please contact Palliative Medicine Team phone at [phone] for questions and concerns.  For individual provider: See Shea Evans

## 2021-03-30 DIAGNOSIS — J069 Acute upper respiratory infection, unspecified: Secondary | ICD-10-CM | POA: Diagnosis not present

## 2021-03-30 DIAGNOSIS — U071 COVID-19: Secondary | ICD-10-CM | POA: Diagnosis not present

## 2021-03-30 LAB — GLUCOSE, CAPILLARY
Glucose-Capillary: 161 mg/dL — ABNORMAL HIGH (ref 70–99)
Glucose-Capillary: 250 mg/dL — ABNORMAL HIGH (ref 70–99)
Glucose-Capillary: 162 mg/dL — ABNORMAL HIGH (ref 70–99)
Glucose-Capillary: 175 mg/dL — ABNORMAL HIGH (ref 70–99)

## 2021-03-30 MED ORDER — CARVEDILOL 12.5 MG PO TABS
12.5000 mg | ORAL_TABLET | Freq: Two times a day (BID) | ORAL | Status: DC
  Administered 2021-03-30 – 2021-04-07 (×16): 12.5 mg via ORAL
  Filled 2021-03-30 (×16): qty 1

## 2021-03-30 MED ORDER — HYDRALAZINE HCL 20 MG/ML IJ SOLN
10.0000 mg | Freq: Four times a day (QID) | INTRAMUSCULAR | Status: DC | PRN
  Administered 2021-03-30: 10 mg via INTRAVENOUS
  Filled 2021-03-30: qty 1

## 2021-03-30 MED ORDER — LABETALOL HCL 5 MG/ML IV SOLN: 10.0000 mg | INTRAVENOUS | Status: DC | PRN

## 2021-03-30 NOTE — Progress Notes (Signed)
PROGRESS NOTE    Manuel Le  [id number] DOB: Feb 18, 1927 DOA: 03/26/2021 PCP: Celene Squibb, MD    Chief Complaint  Patient presents with   Shortness of Breath    Brief Summary:- Manuel Le  is a 87 y.o. male reformed smoker with past medical history relevant for chronic A. fib/CAD, HTN, BPH --Patient was recently admitted to the hospital from 03/18/2021 through 03/22/2021 for COVID-pneumonia -was treated with remdesivir and steroids--- was discharged home with home health after patient and family refused SNF rehab --Patient is unvaccinated -Patient was readmitted on 03/26/2021 with worsening hypoxia  on 5 L of oxygen via nasal cannula -- On admission CTA chest on 03/26/2021 with extensive worsening COVID PNA,  No acute pulmonary embolism    Assessment & Plan: 1-Acute respiratory disease due to COVID-19 virus -on 8/10.22--Worsening oxygen requirement currently up to 9 L/min -Given images abnormalities at time of admission, increased inflammatory markers and concerns of worsening COVID infection patient will received treatment with Actemra on 03/27/21 -continue IV steroids, bronchodilators and supportive management -Prognosis is guarded. -Patient is non-vaccinated. -continue oxygen supplementation and continue the use of flutter valve and IS.  2-Atrial fibrillation ([place]) -Rate controlled -Chronically on aspirin -continue coreg  3-chronic diastolic heart failure -Stable and compensated -Continue to follow daily weights and strict I's and O -Continue Coreg and losartan -Last ejection fraction 50 to 55% from echo 2019. -No crackles on exam; patient denies orthopnea. -Continue heart healthy diet  4-hyperglycemia -No prior history of diabetes--recent A1c 5.9 -Probably associated to the use of his steroids -Continue sliding scale insulin and follow CBGs.   5-GI prophylaxis/GERD -Continue PPI especially while on steroids  6-social/ethics --- -discussed with patient/family  (HCPOA---[name])--- -DNR/DNI -Continue full scope of treatment -Palliative care consult appreciated  7-essential hypertension---BP trending higher probably due to steroids -Increase Coreg to 12.5 mg twice daily -May use IV hydralazine as needed  DVT prophylaxis: Lovenox Code Status: DNR/DNI Family Communication: Next of skin [name] has been updated by our palliative care service. Disposition:   Status is: Inpatient  Remains inpatient appropriate because: Worsening hypoxia--requiring IV steroids  Dispo: The patient is from: Home              Anticipated d/c is to: To be determined--- SNF Vs hospice involvement if he decompensates further              Patient currently is not medically stable to d/c.   Difficult to place patient No    Consultants:  None  Procedures:  See below for x-ray reports.  Antimicrobials:  None   Subjective: -Worsening hypoxia overnight not requiring 9 to 10 L of oxygen via nasal cannula -Cough and shortness of breath persist desaturates with minimal activity -A bit more sleepy today  Objective: Vitals:   03/29/21 2059 03/30/21 0500 03/30/21 0752 03/30/21 1325  BP: (!) 144/66 (!) 166/86 (!) 161/69 (!) 195/77  Pulse: 82 74 72 64  Resp: '19 19 18 19  '$ Temp: 98.3 F (36.8 C) 98.2 F (36.8 C)  98 F (36.7 C)  TempSrc:    Oral  SpO2: 95% 93% 94% 92%  Weight:      Height:        Intake/Output Summary (Last 24 hours) at 03/30/2021 1401 Last data filed at 03/30/2021 0500 Gross per 24 hour  Intake --  Output 750 ml  Net -750 ml   Filed Weights   03/26/21 1644 03/27/21 1000  Weight: 97.1 kg 93.4 kg  Examination:   Physical Exam  Gen:- a bit sleepy, wakes up easily , chronically ill-appearing HEENT:- [place].AT, No sclera icterus Nose- [place] 9L/min Neck-Supple Neck,No JVD,.  Lungs-diminished air  movement, scattered wheezes bilaterally  CV- S1, S2 normal, RRR Abd-  +ve B.Sounds, Abd Soft, No tenderness,     Extremity/Skin:- No  edema,   good pulses Psych-affect is flat, oriented x3 Neuro-generalized weakness, no new focal deficits, no tremors   Data Reviewed: I have personally reviewed following labs and imaging studies  CBC: Recent Labs  Lab 03/26/21 1722 03/27/21 0715 03/29/21 0624  WBC 16.8* 13.9* 12.3*  NEUTROABS 15.9*  --   --   HGB 13.8 13.9 15.1  HCT 41.5 42.7 45.2  MCV 93.5 93.6 93.8  PLT 237 207 0000000    Basic Metabolic Panel: Recent Labs  Lab 03/26/21 1722 03/27/21 0526 03/29/21 0624  NA 134* 135 140  K 4.0 4.1 4.3  CL 104 106 108  CO2 '22 22 23  '$ GLUCOSE 299* 191* 178*  BUN 25* 25* 34*  CREATININE 0.87 0.74 0.83  CALCIUM 8.4* 8.4* 9.1  MG  --   --  2.6*    GFR: Estimated Creatinine Clearance: 67.4 mL/min (by C-G formula based on SCr of 0.83 mg/dL).  Liver Function Tests: Recent Labs  Lab 03/26/21 1722 03/29/21 0624  AST 37 28  ALT 31 31  ALKPHOS 49 54  BILITOT 0.8 1.1  PROT 6.8 6.6  ALBUMIN 3.1* 3.0*    CBG: Recent Labs  Lab 03/29/21 1111 03/29/21 1603 03/29/21 2100 03/30/21 0721 03/30/21 1106  GLUCAP 190* 187* 186* 161* 250*    No results found for this or any previous visit (from the past 240 hour(s)).    Radiology Studies: No results found.   Scheduled Meds:  ascorbic acid  500 mg Oral Daily   aspirin  81 mg Oral Daily   carvedilol  12.5 mg Oral BID   enoxaparin (LOVENOX) injection  40 mg Subcutaneous Q24H   ferrous sulfate  325 mg Oral Q breakfast   finasteride  5 mg Oral Daily   furosemide  20 mg Oral Once per day on Sun Tue Thu Sat   guaiFENesin  600 mg Oral BID   insulin aspart  0-5 Units Subcutaneous QHS   insulin aspart  0-6 Units Subcutaneous TID WC   insulin detemir  5 Units Subcutaneous QHS   losartan  100 mg Oral Daily   mouth rinse  15 mL Mouth Rinse BID   methylPREDNISolone (SOLU-MEDROL) injection  60 mg Intravenous Q8H   pantoprazole  40 mg Oral Daily   sertraline  50 mg Oral Daily   sodium chloride flush   3 mL Intravenous Q12H   sodium chloride flush  3 mL Intravenous Q12H   vitamin B-12  1,000 mcg Per NG tube Daily   zinc sulfate  220 mg Oral Daily   Continuous Infusions:  sodium chloride       LOS: 4 days   Roxan Hockey, MD [facility]  To contact the attending provider between 7A-7P or the covering provider during after hours 7P-7A, please log into the web site [email or web address] and access using universal [place] password for that web site. If you do not have the password, please call the hospital operator.  03/30/2021, 2:01 PM

## 2021-03-31 DIAGNOSIS — J9621 Acute and chronic respiratory failure with hypoxia: Secondary | ICD-10-CM

## 2021-03-31 DIAGNOSIS — J069 Acute upper respiratory infection, unspecified: Secondary | ICD-10-CM | POA: Diagnosis not present

## 2021-03-31 DIAGNOSIS — U071 COVID-19: Secondary | ICD-10-CM | POA: Diagnosis not present

## 2021-03-31 DIAGNOSIS — Z515 Encounter for palliative care: Secondary | ICD-10-CM | POA: Diagnosis not present

## 2021-03-31 LAB — GLUCOSE, CAPILLARY
Glucose-Capillary: 162 mg/dL — ABNORMAL HIGH (ref 70–99)
Glucose-Capillary: 174 mg/dL — ABNORMAL HIGH (ref 70–99)
Glucose-Capillary: 162 mg/dL — ABNORMAL HIGH (ref 70–99)
Glucose-Capillary: 220 mg/dL — ABNORMAL HIGH (ref 70–99)

## 2021-03-31 NOTE — Progress Notes (Signed)
Titrated patient from 13L to 10L HFNC . Tolerated well patient remained at 94% .  03/31/21 1300  Oxygen Therapy  SpO2 94 %  O2 Device HFNC  O2 Flow Rate (L/min) 10 L/min

## 2021-03-31 NOTE — Progress Notes (Signed)
PROGRESS NOTE    Manuel Le  [id number] DOB: Jun 02, 1927 DOA: 03/26/2021 PCP: Celene Squibb, MD    Chief Complaint  Patient presents with   Shortness of Breath    Brief Summary:- Manuel Le  is a 87 y.o. male reformed smoker with past medical history relevant for chronic A. fib/CAD, HTN, BPH --Patient was recently admitted to the hospital from 03/18/2021 through 03/22/2021 for COVID-pneumonia -was treated with remdesivir and steroids--- was discharged home with home health after patient and family refused SNF rehab --Patient is unvaccinated -Patient was readmitted on 03/26/2021 with worsening hypoxia  on 5 L of oxygen via nasal cannula -- On admission CTA chest on 03/26/2021 with extensive worsening COVID PNA,  No acute pulmonary embolism As of 03/31/21 ---currently requiring 10 to 13 L/min of Oxygen   Assessment & Plan: 1-Acute respiratory disease due to COVID-19 virus -As of 03/31/21 ---currently requiring 10 to 13 L/min of Oxygen -Given images abnormalities at time of admission, increased inflammatory markers and concerns of worsening COVID infection patient will received treatment with Actemra on 03/27/21 -continue IV steroids, bronchodilators and supportive management -Prognosis is guarded,  -Patient is non-vaccinated. -continue oxygen supplementation and continue the use of flutter valve and IS.  2-Atrial fibrillation ([place]) -Rate controlled, c/n Coreg -Chronically on aspirin  3-chronic diastolic heart failure -Stable and compensated -Continue Coreg and losartan -Last ejection fraction 50 to 55% from echo 2019. Hypoxia and Dyspnea is mostly due to # 1  4-hyperglycemia -No prior history of diabetes--recent A1c 5.9 -Probably associated to the use of his steroids -Continue sliding scale insulin and follow CBGs.  5-GI prophylaxis/GERD -Continue PPI especially while on steroids  6-social/ethics --- -discussed with patient/family (HCPOA---[name])---  -DNR/DNI -Continue full scope of treatment -Palliative care consult appreciated  7-HTN---BP trending higher probably due to steroids Improved BP control --- c/n Coreg to 12.5 mg twice daily -May use IV hydralazine as needed  DVT prophylaxis: Lovenox Code Status: DNR/DNI Family Communication: Next of skin [name] has been updated by our palliative care service Disposition:   Status is: Inpatient  Remains inpatient appropriate because: Worsening hypoxia--requiring IV steroids  Dispo: The patient is from: Home              Anticipated d/c is to: To be determined--- SNF Vs hospice involvement if he decompensates further              Patient currently is not medically stable to d/c.   Difficult to place patient No  Consultants:  None  Procedures:  See below for x-ray reports.  Antimicrobials:  None  Subjective: -As of 03/31/21 ---currently requiring 10 to 13 L/min of Oxygen Dyspnea persist , Cough persist-  Objective: Vitals:   03/30/21 1639 03/31/21 0515 03/31/21 1254 03/31/21 1300  BP:  (!) 154/63 (!) 132/57   Pulse: (!) 55 (!) 56 (!) 54   Resp:  18    Temp:  98.4 F (36.9 C) 97.8 F (36.6 C)   TempSrc:   Oral   SpO2: 90% 92% 93% 94%  Weight:      Height:        Intake/Output Summary (Last 24 hours) at 03/31/2021 1635 Last data filed at 03/31/2021 1500 Gross per 24 hour  Intake 720 ml  Output 850 ml  Net -130 ml   Filed Weights   03/26/21 1644 03/27/21 1000  Weight: 97.1 kg 93.4 kg    Examination:  Physical Exam Gen:- Resting, chronically ill-appearing HEENT:- [place].AT, No  sclera icterus Nose- [place] 10 L/min Neck-Supple Neck,No JVD,.  Lungs-diminished air  movement, scattered wheezes/rhonchi bilaterally  CV- S1, S2 normal, RRR Abd-  +ve B.Sounds, Abd Soft, No tenderness,    Extremity/Skin:- No  edema,   good pulses Psych-affect is flat, oriented x3 Neuro-Generalized weakness, no new focal deficits, no tremors   Data Reviewed: I have personally  reviewed following labs and imaging studies  CBC: Recent Labs  Lab 03/26/21 1722 03/27/21 0715 03/29/21 0624  WBC 16.8* 13.9* 12.3*  NEUTROABS 15.9*  --   --   HGB 13.8 13.9 15.1  HCT 41.5 42.7 45.2  MCV 93.5 93.6 93.8  PLT 237 207 0000000    Basic Metabolic Panel: Recent Labs  Lab 03/26/21 1722 03/27/21 0526 03/29/21 0624  NA 134* 135 140  K 4.0 4.1 4.3  CL 104 106 108  CO2 '22 22 23  '$ GLUCOSE 299* 191* 178*  BUN 25* 25* 34*  CREATININE 0.87 0.74 0.83  CALCIUM 8.4* 8.4* 9.1  MG  --   --  2.6*   GFR: Estimated Creatinine Clearance: 67.4 mL/min (by C-G formula based on SCr of 0.83 mg/dL).  Liver Function Tests: Recent Labs  Lab 03/26/21 1722 03/29/21 0624  AST 37 28  ALT 31 31  ALKPHOS 49 54  BILITOT 0.8 1.1  PROT 6.8 6.6  ALBUMIN 3.1* 3.0*    CBG: Recent Labs  Lab 03/30/21 1604 03/30/21 2221 03/31/21 0715 03/31/21 1112 03/31/21 1616  GLUCAP 162* 175* 162* 174* 162*    No results found for this or any previous visit (from the past 240 hour(s)).   Radiology Studies: No results found.  Scheduled Meds:  ascorbic acid  500 mg Oral Daily   aspirin  81 mg Oral Daily   carvedilol  12.5 mg Oral BID   enoxaparin (LOVENOX) injection  40 mg Subcutaneous Q24H   ferrous sulfate  325 mg Oral Q breakfast   finasteride  5 mg Oral Daily   furosemide  20 mg Oral Once per day on Sun Tue Thu Sat   guaiFENesin  600 mg Oral BID   insulin aspart  0-5 Units Subcutaneous QHS   insulin aspart  0-6 Units Subcutaneous TID WC   insulin detemir  5 Units Subcutaneous QHS   losartan  100 mg Oral Daily   mouth rinse  15 mL Mouth Rinse BID   methylPREDNISolone (SOLU-MEDROL) injection  60 mg Intravenous Q8H   pantoprazole  40 mg Oral Daily   sertraline  50 mg Oral Daily   sodium chloride flush  3 mL Intravenous Q12H   sodium chloride flush  3 mL Intravenous Q12H   vitamin B-12  1,000 mcg Per NG tube Daily   zinc sulfate  220 mg Oral Daily   Continuous Infusions:  sodium  chloride      LOS: 5 days   Roxan Hockey, MD [facility]  To contact the attending provider between 7A-7P or the covering provider during after hours 7P-7A, please log into the web site [email or web address] and access using universal [place] password for that web site. If you do not have the password, please call the hospital operator.  03/31/2021, 4:35 PM

## 2021-03-31 NOTE — Progress Notes (Signed)
PT Cancellation Note  Patient Details Name: Manuel Le MRN: [id number] DOB: February 17, 1927   Cancelled Treatment:    Reason Eval/Treat Not Completed: Medical issues which prohibited therapy. Per nursing, "Per night shift he was going down in the 80's with movement. May need to try to get his o2 requirement lower than it is at this time before doing PT." PT will continue to follow and complete treatment when able.   Floria Raveling. Hartnett-Rands, MS, PT Per [place] [phone]  Pamala Hurry  Hartnett-Rands 03/31/2021, 10:49 AM

## 2021-03-31 NOTE — Progress Notes (Signed)
Daily Progress Note   Patient Name: Manuel Le       Date: 03/31/2021 DOB: 03/27/1927  Age: 87 y.o. MRN#: [id number] Attending Physician: Roxan Hockey, MD Primary Care Physician: Celene Squibb, MD Admit Date: 03/26/2021  Reason for Consultation/Follow-up: Other - advanced care planning  Patient Profile/HPI: 87 y.o. male  with past medical history of Covid pneumonia (discharged on 8/2 to home), a- fib, HTN, BPH  admitted on 03/26/2021 with worsening Covid pneumonia. He is currently requiring 5-6 L oxygen. Palliative medicine consulted for advanced care planning.   Subjective: Manuel Le is awake and alert, he is eating well. He feels "ok".  He is pleased with current state and wishing to continue his current level of care. We discussed his care after he leaves the hospital and possible trajectories. He is hopeful to return to where he lived previously with his nephew.  Spoke with [name] who is patient's power of attorney. Manuel Le desires to continue all interventions. He shares that he does not think that patient's nephew will be able to care for Manuel Le in his current state and would prefer he go to a SNF for rehab and possibly long term care.  Review of Systems  Constitutional:  Positive for malaise/fatigue.  Respiratory:  Positive for cough, shortness of breath and wheezing.     Physical Exam Vitals and nursing note reviewed.  Skin:    General: Skin is warm and dry.  Neurological:     Mental Status: He is alert and oriented to person, place, and time.            Vital Signs: BP (!) 132/57 (BP Location: Right Arm)   Pulse (!) 54   Temp 97.8 F (36.6 C) (Oral)   Resp 18   Ht 6' (1.829 m)   Wt 93.4 kg   SpO2 93%   BMI 27.93 kg/m  SpO2: SpO2: 93 % O2 Device: O2  Device: High Flow Nasal Cannula O2 Flow Rate: O2 Flow Rate (L/min): 13 L/min  Intake/output summary:  Intake/Output Summary (Last 24 hours) at 03/31/2021 1307 Last data filed at 03/31/2021 0900 Gross per 24 hour  Intake 480 ml  Output 850 ml  Net -370 ml   LBM: Last BM Date: 03/30/21 Baseline Weight: Weight: 97.1 kg Most recent weight: Weight: 93.4 kg  Palliative Assessment/Data: PPS: 50%      Patient Active Problem List   Diagnosis Date Noted   Acute respiratory disease due to COVID-19 virus 03/26/2021   Acute on chronic respiratory failure with hypoxia ([place]) 03/26/2021   Pneumonia due to COVID-19 virus 03/18/2021   Acute respiratory failure due to COVID-19 ([place]) 03/18/2021   Chronic diastolic CHF (congestive heart failure) ([place]) 03/18/2021   Generalized weakness 03/18/2021   Truncal ataxia 10/20/2020   Dizzy 10/20/2020   TIA (transient ischemic attack) 10/20/2020   Acute respiratory failure with hypoxia ([place])    Influenza with pneumonia    Palliative care encounter    Sepsis ([place]) 09/25/2017   Fever 09/25/2017   UTI (urinary tract infection) 09/25/2017   Abnormal liver function 09/25/2017   Tachycardia 09/25/2017   Influenza A    Pressure ulcer 09/14/2015   Zenker's diverticulum 09/09/2015   Bladder cancer ([place]) 07/14/2015   CAD (coronary artery disease) 07/24/2014   S/P coronary artery stent placement (RCA bare metal stent on 07/08/14) 07/24/2014   Exertional angina ([place]) 07/08/2014   Encounter for therapeutic drug monitoring 09/12/2013   Atrial fibrillation ([place]) 09/08/2013   HTN (hypertension) 09/08/2013   Fatigue 09/08/2013   Anemia 06/26/2012    Palliative Care Assessment & Plan    Assessment/Recommendations/Plan  Continue current plan of care PMT will monitor and intervene if patient decompensates   Code Status: DNR  Prognosis:  Unable to determine  Discharge Planning: To Be Determined  Care plan was discussed with patient and care  team.  Thank you for allowing the Palliative Medicine Team to assist in the care of this patient.  Total time:  46 mins Prolonged billing: No     Greater than 50%  of this time was spent counseling and coordinating care related to the above assessment and plan.  Mariana Kaufman, AGNP-C Palliative Medicine   Please contact Palliative Medicine Team phone at [phone] for questions and concerns.

## 2021-04-01 DIAGNOSIS — J069 Acute upper respiratory infection, unspecified: Secondary | ICD-10-CM | POA: Diagnosis not present

## 2021-04-01 DIAGNOSIS — U071 COVID-19: Secondary | ICD-10-CM | POA: Diagnosis not present

## 2021-04-01 LAB — CBC
HCT: 40.5 % (ref 39.0–52.0)
Hemoglobin: 13.7 g/dL (ref 13.0–17.0)
MCH: 31.4 pg (ref 26.0–34.0)
MCHC: 33.8 g/dL (ref 30.0–36.0)
MCV: 92.7 fL (ref 80.0–100.0)
Platelets: 179 10*3/uL (ref 150–400)
RBC: 4.37 MIL/uL (ref 4.22–5.81)
RDW: 13 % (ref 11.5–15.5)
WBC: 14.7 10*3/uL — ABNORMAL HIGH (ref 4.0–10.5)
nRBC: 0 % (ref 0.0–0.2)

## 2021-04-01 LAB — BASIC METABOLIC PANEL
Anion gap: 8 (ref 5–15)
BUN: 38 mg/dL — ABNORMAL HIGH (ref 8–23)
CO2: 25 mmol/L (ref 22–32)
Calcium: 8.7 mg/dL — ABNORMAL LOW (ref 8.9–10.3)
Chloride: 105 mmol/L (ref 98–111)
Creatinine, Ser: 0.73 mg/dL (ref 0.61–1.24)
GFR, Estimated: >60 mL/min (ref >60)
Glucose, Bld: 162 mg/dL — ABNORMAL HIGH (ref 70–99)
Potassium: 3.9 mmol/L (ref 3.5–5.1)
Sodium: 138 mmol/L (ref 135–145)

## 2021-04-01 LAB — C-REACTIVE PROTEIN: CRP: 0.6 mg/dL (ref <1.0)

## 2021-04-01 LAB — GLUCOSE, CAPILLARY
Glucose-Capillary: 129 mg/dL — ABNORMAL HIGH (ref 70–99)
Glucose-Capillary: 203 mg/dL — ABNORMAL HIGH (ref 70–99)
Glucose-Capillary: 178 mg/dL — ABNORMAL HIGH (ref 70–99)
Glucose-Capillary: 181 mg/dL — ABNORMAL HIGH (ref 70–99)

## 2021-04-01 LAB — FERRITIN: Ferritin: 147 ng/mL (ref 24–336)

## 2021-04-01 NOTE — Evaluation (Signed)
Clinical/Bedside Swallow Evaluation Patient Details  Name: Manuel Le MRN: [id number] Date of Birth: September 14, 1926  Today's Date: 04/01/2021 Time: SLP Start Time (ACUTE ONLY): 0830 SLP Stop Time (ACUTE ONLY): 0900 SLP Time Calculation (min) (ACUTE ONLY): 30 min  Past Medical History:  Past Medical History:  Diagnosis Date   Arthritis    OSTEO   Atrial fibrillation and flutter ([place])    Bladder cancer ([place])    Coronary artery disease    Dysrhythmia    Exertional angina ([place]) 07/08/2014   H/O urinary frequency    Hematuria    Hypertension    Iron deficiency anemia    Kidney stones    hx of    Mild obstructive sleep apnea    no cpap   S/P coronary artery stent placement 07/24/2014   Skin cancer    Weakness    Past Surgical History:  Past Surgical History:  Procedure Laterality Date   CARDIAC CATHETERIZATION     CARDIOVASCULAR STRESS TEST  12-15-2013  DR Kate Sable ([place])   MILD - MODERATE PERI-INFARCT ISCHEMIA  OF INFERIOR WALL, MID-INFEROSEPTAL WALL, MID-INFEROLATERAL WALL, & BASAL INFERIOR WALL/   NORMAL LVF /  EF 59%/  INTERMEDIATE RISK STUDY   COLONOSCOPY  07/04/2012   Procedure: COLONOSCOPY;  Surgeon: Rogene Houston, MD;  [facility];  Service: Endoscopy;  Laterality: N/A;  320   CORONARY STENT PLACEMENT  07/08/2014   PTCA of RCA   DR COOPER   CYSTOSCOPY W/ RETROGRADES Bilateral 12/31/2013   Procedure: CYSTOSCOPY WITH RETROGRADE PYELOGRAM;  Surgeon: Alexis Frock, MD;  Location: [facility];  Service: Urology;  Laterality: Bilateral;   CYSTOSCOPY W/ RETROGRADES Bilateral 01/20/2015   Procedure: CYSTOSCOPY WITH RETROGRADE PYELOGRAM;  Surgeon: Alexis Frock, MD;  Location: [facility];  Service: Urology;  Laterality: Bilateral;   ESOPHAGOGASTRODUODENOSCOPY  07/04/2012   Procedure: ESOPHAGOGASTRODUODENOSCOPY (EGD);  Surgeon: Rogene Houston, MD;  [facility];  Service: Endoscopy;  Laterality: N/A;   EYE SURGERY     past cataract surgery  bilat   HERNIA REPAIR Right    LEFT HEART CATHETERIZATION WITH CORONARY ANGIOGRAM N/A 07/08/2014   Procedure: LEFT HEART CATHETERIZATION WITH CORONARY ANGIOGRAM;  Surgeon: Blane Ohara, MD;  Location: [facility] CATH LAB;  Service: Cardiovascular;  Laterality: N/A;   PERCUTANEOUS CORONARY STENT INTERVENTION (PCI-S)  07/08/2014   Procedure: PERCUTANEOUS CORONARY STENT INTERVENTION (PCI-S);  Surgeon: Blane Ohara, MD;  Location: [facility] CATH LAB;  Service: Cardiovascular;;   TOTAL KNEE ARTHROPLASTY Bilateral 2000  &  2003?   TRANSTHORACIC ECHOCARDIOGRAM  09-11-2013   MODERATE LVH/  EF 55-60%/  MILD MR & TR/  MILD TO MODERATE CALCIFIED AV WITHOUT STENOSIS/  MILD LAE/   MODERATE PR   TRANSURETHRAL RESECTION OF BLADDER TUMOR N/A 01/20/2015   Procedure: TRANSURETHRAL RESECTION OF BLADDER TUMOR (TURBT);  Surgeon: Alexis Frock, MD;  [facility];  Service: Urology;  Laterality: N/A;   TRANSURETHRAL RESECTION OF BLADDER TUMOR WITH GYRUS (TURBT-GYRUS) N/A 12/31/2013   Procedure: TRANSURETHRAL RESECTION OF BLADDER TUMOR WITH GYRUS (TURBT-GYRUS);  Surgeon: Alexis Frock, MD;  [facility];  Service: Urology;  Laterality: N/A;   ZENKER'S DIVERTICULECTOMY N/A 09/09/2015   Procedure: ENDOSCOPIC ZENKER'S DIVERTICULECTOMY;  Surgeon: Izora Gala, MD;  Location: [facility] OR;  Service: ENT;  Laterality: N/A;   ZENKER'S DIVERTICULECTOMY ENDOSCOPIC  09/09/2015   HPI:  Manuel Le  is a 87 y.o. male reformed smoker with past medical history relevant for chronic A. fib/CAD, HTN, BPH --  Patient was recently admitted to the hospital from 03/18/2021 through 03/22/2021 for COVID-pneumonia -was treated with remdesivir and steroids--- was discharged home with home health after patient and family refused SNF rehab. Patient was readmitted on 03/26/2021 with worsening hypoxia on 5 L of oxygen via nasal cannula.  On admission CTA chest on 03/26/2021 with extensive worsening COVID PNA. As of 03/31/21 --- requiring 10 to 13 L/min of Oxygen. Pt  with dysphagia and feeding tube; in 2019 Pt requested no further diet adjustment. BSE requested   Assessment / Plan / Recommendation Clinical Impression  Clinical swallowing evaluation completed while Pt was sitting upright in bed; Pt presents with a congested sounding cough at baseline. Pt consumed thin liquids, puree textures and regular textures without overt s/sx of aspiration. Note belching after liquid trials ? related to known esophagel dysphagia and occasional delayed congested coughing with did not seem related to swallowing as it was noted before trials. Pt is at higher risk for aspiration secondary to compromised respiratory status, PNA and hx of dysphagia. ST will follow acutely; recommend continue with regular diet and thin liquids. Thank you, SLP Visit Diagnosis: Dysphagia, unspecified (R13.10)    Aspiration Risk  Moderate aspiration risk    Diet Recommendation Regular;Thin liquid   Liquid Administration via: Cup;Straw Medication Administration: Whole meds with liquid Supervision: Patient able to self feed Compensations: Minimize environmental distractions;Slow rate;Small sips/bites Postural Changes: Seated upright at 90 degrees    Other  Recommendations Oral Care Recommendations: Oral care BID   Follow up Recommendations Skilled Nursing facility;24 hour supervision/assistance      Frequency and Duration min 1 x/week  1 week       Prognosis Prognosis for Safe Diet Advancement: Good      Swallow Study   General Date of Onset: 03/26/21 HPI: Manuel Le  is a 87 y.o. male reformed smoker with past medical history relevant for chronic A. fib/CAD, HTN, BPH --Patient was recently admitted to the hospital from 03/18/2021 through 03/22/2021 for COVID-pneumonia -was treated with remdesivir and steroids--- was discharged home with home health after patient and family refused SNF rehab. Patient was readmitted on 03/26/2021 with worsening hypoxia on 5 L of oxygen via nasal cannula.   On admission CTA chest on 03/26/2021 with extensive worsening COVID PNA. As of 03/31/21 --- requiring 10 to 13 L/min of Oxygen. Pt with dysphagia and feeding tube; in 2019 Pt requested no further diet adjustment. BSE requested Type of Study: Bedside Swallow Evaluation Previous Swallow Assessment: BSE 2019 - hx of esophageal dysphagia, MBS 2017 Diet Prior to this Study: Regular;Thin liquids Temperature Spikes Noted: No Respiratory Status: Nasal cannula Behavior/Cognition: Alert;Cooperative;Pleasant mood Oral Cavity Assessment: Within Functional Limits Oral Care Completed by SLP: Recent completion by staff Vision: Functional for self-feeding Self-Feeding Abilities: Able to feed self Patient Positioning: Upright in bed Volitional Cough: Congested Volitional Swallow: Able to elicit    Oral/Motor/Sensory Function Overall Oral Motor/Sensory Function: Within functional limits   Ice Chips Ice chips: Within functional limits   Thin Liquid Thin Liquid: Within functional limits    Nectar Thick Nectar Thick Liquid: Not tested   Honey Thick Honey Thick Liquid: Not tested   Puree Puree: Within functional limits   Solid     Solid: Within functional limits     Manuel Le H. Roddie Mc, CCC-SLP Speech Language Pathologist  Wende Bushy 04/01/2021,10:26 AM

## 2021-04-01 NOTE — Progress Notes (Signed)
Patient is on 12 liter HFNC , Lung sounds are decreased, has very congested cough.Saturation 91-92 , only could do 250 cc on incentive. Not sure how well he takes mdi as he is very weak.

## 2021-04-01 NOTE — Care Management Important Message (Signed)
Important Message  Patient Details  Name: Manuel Le MRN: [id number] Date of Birth: 07-Feb-1927   Medicare Important Message Given:  Yes - Important Message mailed due to current National Emergency     Tommy Medal 04/01/2021, 11:59 AM

## 2021-04-01 NOTE — TOC Progression Note (Signed)
Transition of Care [facility] - Progression Note    Patient Details  Name: Manuel Le MRN: [id number] Date of Birth: [birth year]  [facility] of [facility]) CM/SW Contact  Shade Flood, LCSW Phone Number: 04/01/2021, 11:27 AM  Clinical Narrative:     TOC following. Pt currently on 10L O2. Spoke with LTAC reps to determine if pt is a candidate. Was informed that pt's insurance requires a 3 day ICU stay during current admission as part of the qualifying criteria. Since pt has not yet had a 3 day ICU stay, he is not a candidate at this time.   TOC will follow and continue to assist with dc planning.       Expected Discharge Plan and Services                                                 Social Determinants of Health (SDOH) Interventions    Readmission Risk Interventions Readmission Risk Prevention Plan 03/21/2021  Transportation Screening Complete  PCP or Specialist Appt within 3-5 Days Complete  HRI or [place] Complete  Social Work Consult for [place] Planning/Counseling Complete  Palliative Care Screening Not Applicable  Medication Review [employer] photographer) Complete  Some recent data might be hidden

## 2021-04-01 NOTE — Progress Notes (Signed)
PROGRESS NOTE    Manuel Le  [id number] DOB: 01/15/1927 DOA: 03/26/2021 PCP: Celene Squibb, MD    Chief Complaint  Patient presents with   Shortness of Breath    Brief Summary:- Manuel Le  is a 87 y.o. male reformed smoker with past medical history relevant for chronic A. fib/CAD, HTN, BPH --Patient was recently admitted to the hospital from 03/18/2021 through 03/22/2021 for COVID-pneumonia -was treated with remdesivir and steroids--- was discharged home with home health after patient and family refused SNF rehab --Patient is unvaccinated -Patient was readmitted on 03/26/2021 with worsening hypoxia  on 5 L of oxygen via nasal cannula -- On admission CTA chest on 03/26/2021 with extensive worsening COVID PNA,  No acute pulmonary embolism As of 04/01/21 ---currently requiring 10  L/min of Oxygen   Assessment & Plan: 1-Acute respiratory disease due to COVID-19 virus As of 04/01/21 ---currently requiring 10  L/min of Oxygen -Given images abnormalities at time of admission, increased inflammatory markers and concerns of worsening COVID infection patient will received treatment with Actemra on 03/27/21 -continue IV steroids, bronchodilators and supportive management -Prognosis is guarded,  -Patient is non-vaccinated. -continue oxygen supplementation and continue the use of flutter valve and IS.  2-Atrial fibrillation ([place]) -Rate controlled, c/n Coreg -Chronically on aspirin  3-chronic diastolic heart failure -Stable and compensated -Continue Coreg and losartan -Last ejection fraction 50 to 55% from echo 2019. Hypoxia and Dyspnea is mostly due to # 1  4-hyperglycemia -No prior history of diabetes--recent A1c 5.9 -Probably associated to the use of his steroids -Continue sliding scale insulin and follow CBGs.  5-GI prophylaxis/GERD -Continue PPI especially while on steroids  6-social/ethics --- -discussed with patient/family (HCPOA---Mr Manuel Le)--- -DNR/DNI -Continue full  scope of treatment -Palliative care consult appreciated -Overall prognosis is not encouraging given persistent and prolonged requirement for high flow oxygen  7-HTN--Improved BP control --- c/n Coreg to 12.5 mg twice daily -May use IV hydralazine as needed  DVT prophylaxis: Lovenox Code Status: DNR/DNI Family Communication: Next of skin Manuel Le has been updated Disposition:   Status is: Inpatient  Remains inpatient appropriate because: Worsening hypoxia--requiring IV steroids  Dispo: The patient is from: Home              Anticipated d/c is to: To be determined--- SNF Vs hospice involvement if he decompensates further              Patient currently is not medically stable to d/c.   Difficult to place patient No  Consultants:  None  Procedures:  See below for x-ray reports.  Antimicrobials:  None  Subjective: As of 04/01/21 ---currently requiring 10  L/min of Oxygen -Oral intake is fair, no fevers, cough and dyspnea persist  Objective: Vitals:   03/31/21 2022 04/01/21 0420 04/01/21 0949 04/01/21 1513  BP: 138/63 (!) 130/56 139/77 129/73  Pulse: 66 (!) 55 67 62  Resp: '20 18  19  '$ Temp: 98.2 F (36.8 C) 98.2 F (36.8 C)  (!) 97.5 F (36.4 C)  TempSrc:    Oral  SpO2: 92% 91%  (!) 88%  Weight:      Height:        Intake/Output Summary (Last 24 hours) at 04/01/2021 1848 Last data filed at 04/01/2021 1300 Gross per 24 hour  Intake 480 ml  Output 700 ml  Net -220 ml   Filed Weights   03/26/21 1644 03/27/21 1000  Weight: 97.1 kg 93.4 kg    Examination:  Physical Exam Gen:-  Resting, chronically ill-appearing HEENT:- Manuel Le.AT, No sclera icterus Nose-  10 L/min Neck-Supple Neck,No JVD,.  Lungs-diminished air  movement, scattered wheezes/rhonchi bilaterally  CV- S1, S2 normal, RRR Abd-  +ve B.Sounds, Abd Soft, No tenderness,    Extremity/Skin:- No  edema,   good pulses Psych-affect is flat, oriented x3 Neuro-Generalized weakness, no new focal deficits,  no tremors   Data Reviewed: I have personally reviewed following labs and imaging studies  CBC: Recent Labs  Lab 03/26/21 1722 03/27/21 0715 03/29/21 0624 04/01/21 0423  WBC 16.8* 13.9* 12.3* 14.7*  NEUTROABS 15.9*  --   --   --   HGB 13.8 13.9 15.1 13.7  HCT 41.5 42.7 45.2 40.5  MCV 93.5 93.6 93.8 92.7  PLT 237 207 204 0000000    Basic Metabolic Panel: Recent Labs  Lab 03/26/21 1722 03/27/21 0526 03/29/21 0624 04/01/21 0423  NA 134* 135 140 138  K 4.0 4.1 4.3 3.9  CL 104 106 108 105  CO2 '22 22 23 25  '$ GLUCOSE 299* 191* 178* 162*  BUN 25* 25* 34* 38*  CREATININE 0.87 0.74 0.83 0.73  CALCIUM 8.4* 8.4* 9.1 8.7*  MG  --   --  2.6*  --    GFR: Estimated Creatinine Clearance: 69.9 mL/min (by C-G formula based on SCr of 0.73 mg/dL).  Liver Function Tests: Recent Labs  Lab 03/26/21 1722 03/29/21 0624  AST 37 28  ALT 31 31  ALKPHOS 49 54  BILITOT 0.8 1.1  PROT 6.8 6.6  ALBUMIN 3.1* 3.0*    CBG: Recent Labs  Lab 03/31/21 1616 03/31/21 2022 04/01/21 0742 04/01/21 1116 04/01/21 1811  GLUCAP 162* 220* 129* 203* 178*    No results found for this or any previous visit (from the past 240 hour(s)).   Radiology Studies: No results found.  Scheduled Meds:  ascorbic acid  500 mg Oral Daily   aspirin  81 mg Oral Daily   carvedilol  12.5 mg Oral BID   enoxaparin (LOVENOX) injection  40 mg Subcutaneous Q24H   ferrous sulfate  325 mg Oral Q breakfast   finasteride  5 mg Oral Daily   furosemide  20 mg Oral Once per day on Sun Tue Thu Sat   guaiFENesin  600 mg Oral BID   insulin aspart  0-5 Units Subcutaneous QHS   insulin aspart  0-6 Units Subcutaneous TID WC   insulin detemir  5 Units Subcutaneous QHS   losartan  100 mg Oral Daily   mouth rinse  15 mL Mouth Rinse BID   methylPREDNISolone (SOLU-MEDROL) injection  60 mg Intravenous Q8H   pantoprazole  40 mg Oral Daily   sertraline  50 mg Oral Daily   sodium chloride flush  3 mL Intravenous Q12H   sodium  chloride flush  3 mL Intravenous Q12H   vitamin B-12  1,000 mcg Per NG tube Daily   zinc sulfate  220 mg Oral Daily   Continuous Infusions:  sodium chloride      LOS: 6 days   Roxan Hockey, MD [facility]  To contact the attending provider between 7A-7P or the covering provider during after hours 7P-7A, please log into the web site [email or web address] and access using universal [place] password for that web site. If you do not have the password, please call the hospital operator.  04/01/2021, 6:48 PM

## 2021-04-02 DIAGNOSIS — U071 COVID-19: Secondary | ICD-10-CM | POA: Diagnosis not present

## 2021-04-02 DIAGNOSIS — J069 Acute upper respiratory infection, unspecified: Secondary | ICD-10-CM | POA: Diagnosis not present

## 2021-04-02 LAB — GLUCOSE, CAPILLARY
Glucose-Capillary: 117 mg/dL — ABNORMAL HIGH (ref 70–99)
Glucose-Capillary: 175 mg/dL — ABNORMAL HIGH (ref 70–99)

## 2021-04-02 NOTE — Progress Notes (Signed)
PROGRESS NOTE    Manuel Le  [id number] DOB: 05/15/1927 DOA: 03/26/2021 PCP: Celene Squibb, MD    Chief Complaint  Patient presents with   Shortness of Breath    Brief Summary:- Manuel Le  is a 87 y.o. male reformed smoker with past medical history relevant for chronic A. fib/CAD, HTN, BPH --Patient was recently admitted to the hospital from 03/18/2021 through 03/22/2021 for COVID-pneumonia -was treated with remdesivir and steroids--- was discharged home with home health after patient and family refused SNF rehab --Patient is unvaccinated -Patient was readmitted on 03/26/2021 with worsening hypoxia  on 5 L of oxygen via nasal cannula -- On admission CTA chest on 03/26/2021 with extensive worsening COVID PNA,  No acute pulmonary embolism As of 04/02/21 ---currently requiring 12  L/min of Oxygen   Assessment & Plan: 1-Acute respiratory disease due to COVID-19 virus As of 04/01/21 ---currently requiring 12 L/min of Oxygen -Given images abnormalities at time of admission, increased inflammatory markers and concerns of worsening COVID infection patient will received treatment with Actemra on 03/27/21 -continue IV steroids, bronchodilators and supportive management -Prognosis is guarded,  -Patient is non-vaccinated. -continue oxygen supplementation and continue the use of flutter valve and IS. -Prognosis is not great  2-Atrial fibrillation ([facility]) -Rate controlled, c/n Coreg -Chronically on aspirin  3-chronic diastolic heart failure -Stable and compensated -Continue Coreg and losartan -Last ejection fraction 50 to 55% from echo 2019. Hypoxia and Dyspnea is mostly due to # 1  4-hyperglycemia -No prior history of diabetes--recent A1c 5.9 -Probably associated to the use of his steroids -Continue sliding scale insulin and follow CBGs.  5-GI prophylaxis/GERD -Continue PPI especially while on steroids  6-social/ethics --- -discussed with patient/family (HCPOA---[name])---  -DNR/DNI -Continue full scope of treatment -Palliative care consult appreciated -Overall prognosis is not encouraging given persistent and prolonged requirement for high flow oxygen  7-HTN--Improved BP control --- c/n Coreg to 12.5 mg twice daily -May use IV hydralazine as needed  DVT prophylaxis: Lovenox Code Status: DNR/DNI Family Communication: Next of skin [name] has been updated Disposition:   Status is: Inpatient  Remains inpatient appropriate because: Worsening hypoxia--requiring IV steroids  Dispo: The patient is from: Home              Anticipated d/c is to: To be determined--- SNF Vs hospice involvement if he decompensates further              Patient currently is not medically stable to d/c.   Difficult to place patient No  Consultants:  None  Procedures:  See below for x-ray reports.  Antimicrobials:  None  Subjective: As of 04/02/21 ---currently requiring 12  L/min of Oxygen -Oral intake is fair, no fevers, cough and dyspnea persist Patient looks weak and tired  Objective: Vitals:   04/01/21 2053 04/01/21 2103 04/02/21 0555 04/02/21 1444  BP:  119/62 (!) 173/65 (!) 121/55  Pulse:  61 (!) 58 75  Resp:  '19 19 19  '$ Temp:  (!) 97.4 F (36.3 C) (!) 97.4 F (36.3 C)   TempSrc:  Oral Oral   SpO2: 92% 91% (!) 85% 92%  Weight:      Height:        Intake/Output Summary (Last 24 hours) at 04/02/2021 1902 Last data filed at 04/01/2021 2242 Gross per 24 hour  Intake 6 ml  Output --  Net 6 ml   Filed Weights   03/26/21 1644 03/27/21 1000  Weight: 97.1 kg 93.4 kg  Examination:  Physical Exam Gen:- Resting, chronically ill-appearing HEENT:- [place].AT, No sclera icterus Nose- [place] 12 L/min Neck-Supple Neck,No JVD,.  Lungs-diminished air  movement, scattered wheezes/rhonchi bilaterally  CV- S1, S2 normal, RRR Abd-  +ve B.Sounds, Abd Soft, No tenderness,    Extremity/Skin:- No  edema,   good pulses Psych-affect is flat, oriented  x3 Neuro-Generalized weakness, no new focal deficits, no tremors   Data Reviewed: I have personally reviewed following labs and imaging studies  CBC: Recent Labs  Lab 03/27/21 0715 03/29/21 0624 04/01/21 0423  WBC 13.9* 12.3* 14.7*  HGB 13.9 15.1 13.7  HCT 42.7 45.2 40.5  MCV 93.6 93.8 92.7  PLT 207 204 0000000    Basic Metabolic Panel: Recent Labs  Lab 03/27/21 0526 03/29/21 0624 04/01/21 0423  NA 135 140 138  K 4.1 4.3 3.9  CL 106 108 105  CO2 '22 23 25  '$ GLUCOSE 191* 178* 162*  BUN 25* 34* 38*  CREATININE 0.74 0.83 0.73  CALCIUM 8.4* 9.1 8.7*  MG  --  2.6*  --    GFR: Estimated Creatinine Clearance: 69.9 mL/min (by C-G formula based on SCr of 0.73 mg/dL).  Liver Function Tests: Recent Labs  Lab 03/29/21 0624  AST 28  ALT 31  ALKPHOS 54  BILITOT 1.1  PROT 6.6  ALBUMIN 3.0*    CBG: Recent Labs  Lab 04/01/21 0742 04/01/21 1116 04/01/21 1811 04/01/21 2107 04/02/21 1139  GLUCAP 129* 203* 178* 181* 117*    No results found for this or any previous visit (from the past 240 hour(s)).   Radiology Studies: No results found.  Scheduled Meds:  ascorbic acid  500 mg Oral Daily   aspirin  81 mg Oral Daily   carvedilol  12.5 mg Oral BID   enoxaparin (LOVENOX) injection  40 mg Subcutaneous Q24H   ferrous sulfate  325 mg Oral Q breakfast   finasteride  5 mg Oral Daily   furosemide  20 mg Oral Once per day on Sun Tue Thu Sat   guaiFENesin  600 mg Oral BID   insulin aspart  0-5 Units Subcutaneous QHS   insulin aspart  0-6 Units Subcutaneous TID WC   insulin detemir  5 Units Subcutaneous QHS   losartan  100 mg Oral Daily   mouth rinse  15 mL Mouth Rinse BID   methylPREDNISolone (SOLU-MEDROL) injection  60 mg Intravenous Q8H   pantoprazole  40 mg Oral Daily   sertraline  50 mg Oral Daily   sodium chloride flush  3 mL Intravenous Q12H   sodium chloride flush  3 mL Intravenous Q12H   vitamin B-12  1,000 mcg Per NG tube Daily   zinc sulfate  220 mg Oral  Daily   Continuous Infusions:  sodium chloride      LOS: 7 days   Roxan Hockey, MD [facility]  To contact the attending provider between 7A-7P or the covering provider during after hours 7P-7A, please log into the web site [email or web address] and access using universal [place] password for that web site. If you do not have the password, please call the hospital operator.  04/02/2021, 7:02 PM

## 2021-04-03 DIAGNOSIS — J069 Acute upper respiratory infection, unspecified: Secondary | ICD-10-CM | POA: Diagnosis not present

## 2021-04-03 DIAGNOSIS — U071 COVID-19: Secondary | ICD-10-CM | POA: Diagnosis not present

## 2021-04-03 LAB — CBC
HCT: 48.5 % (ref 39.0–52.0)
Hemoglobin: 16.2 g/dL (ref 13.0–17.0)
MCH: 31.2 pg (ref 26.0–34.0)
MCHC: 33.4 g/dL (ref 30.0–36.0)
MCV: 93.4 fL (ref 80.0–100.0)
Platelets: 134 10*3/uL — ABNORMAL LOW (ref 150–400)
RBC: 5.19 MIL/uL (ref 4.22–5.81)
RDW: 13.2 % (ref 11.5–15.5)
WBC: 19.9 10*3/uL — ABNORMAL HIGH (ref 4.0–10.5)
nRBC: 0 % (ref 0.0–0.2)

## 2021-04-03 LAB — COMPREHENSIVE METABOLIC PANEL
ALT: 41 U/L (ref 0–44)
AST: 30 U/L (ref 15–41)
Albumin: 3 g/dL — ABNORMAL LOW (ref 3.5–5.0)
Alkaline Phosphatase: 60 U/L (ref 38–126)
Anion gap: 8 (ref 5–15)
BUN: 37 mg/dL — ABNORMAL HIGH (ref 8–23)
CO2: 26 mmol/L (ref 22–32)
Calcium: 8.7 mg/dL — ABNORMAL LOW (ref 8.9–10.3)
Chloride: 106 mmol/L (ref 98–111)
Creatinine, Ser: 0.76 mg/dL (ref 0.61–1.24)
GFR, Estimated: >60 mL/min (ref >60)
Glucose, Bld: 194 mg/dL — ABNORMAL HIGH (ref 70–99)
Potassium: 4.7 mmol/L (ref 3.5–5.1)
Sodium: 140 mmol/L (ref 135–145)
Total Bilirubin: 1.2 mg/dL (ref 0.3–1.2)
Total Protein: 6.2 g/dL — ABNORMAL LOW (ref 6.5–8.1)

## 2021-04-03 LAB — GLUCOSE, CAPILLARY
Glucose-Capillary: 178 mg/dL — ABNORMAL HIGH (ref 70–99)
Glucose-Capillary: 217 mg/dL — ABNORMAL HIGH (ref 70–99)
Glucose-Capillary: 306 mg/dL — ABNORMAL HIGH (ref 70–99)
Glucose-Capillary: 231 mg/dL — ABNORMAL HIGH (ref 70–99)

## 2021-04-03 NOTE — Progress Notes (Addendum)
Patient did about 300 to 250 cc on incentive. Weaker with each breath. Flutter is weak with little flutter on device. Pt has spontaneous strong cough that is non productive. Cough on demand does not exist for most part. On 12 HFNC. Breath sounds Rhonchus. Saturation 92.

## 2021-04-03 NOTE — Progress Notes (Signed)
PROGRESS NOTE    Manuel Le  [id number] DOB: April 06, 1927 DOA: 03/26/2021 PCP: Celene Squibb, MD    Chief Complaint  Patient presents with   Shortness of Breath    Brief Summary:- Manuel Le  is a 87 y.o. male reformed smoker with past medical history relevant for chronic A. fib/CAD, HTN, BPH --Patient was recently admitted to the hospital from 03/18/2021 through 03/22/2021 for COVID-pneumonia -was treated with remdesivir and steroids--- was discharged home with home health after patient and family refused SNF rehab --Patient is unvaccinated -Patient was readmitted on 03/26/2021 with worsening hypoxia  on 5 L of oxygen via nasal cannula -- On admission CTA chest on 03/26/2021 with extensive worsening COVID PNA,  No acute pulmonary embolism As of 04/03/21 ---currently requiring 12  L/min of Oxygen   Assessment & Plan: 1-Acute respiratory disease due to COVID-19 virus As of 04/03/21 ---currently requiring 12 L/min of Oxygen -Given images abnormalities at time of admission, increased inflammatory markers and concerns of worsening COVID infection patient will received treatment with Actemra on 03/27/21 -continue IV steroids, bronchodilators and supportive management -Prognosis is guarded,  -Patient is non-vaccinated. -continue oxygen supplementation and continue the use of flutter valve and IS.   2-Atrial fibrillation ([place]) -Rate controlled, c/n Coreg -Chronically on aspirin  3-chronic diastolic heart failure -Stable and compensated -Continue Coreg and losartan -Last ejection fraction 50 to 55% from echo 2019. Hypoxia and Dyspnea is mostly due to # 1  4-Hyperglycemia -No prior history of diabetes--recent A1c 5.9 -Probably associated to the use of his steroids -Continue sliding scale insulin and follow CBGs.  5-GI prophylaxis/GERD -Continue PPI especially while on steroids  6-Social/ethics --- -discussed with patient/family (HCPOA---Manuel Le)--- -DNR/DNI -Continue full  scope of treatment -Palliative care consult appreciated -Overall prognosis is not encouraging given persistent and prolonged requirement for high flow oxygen  7-HTN--Improved BP control --- c/n Coreg to 12.5 mg twice daily -May use IV hydralazine as needed  DVT prophylaxis: Lovenox Code Status: DNR/DNI Family Communication: Next of skin Jodie Le has been updated Disposition:   Status is: Inpatient  Remains inpatient appropriate because: Worsening hypoxia--requiring IV steroids  Dispo: The patient is from: Home              Anticipated d/c is to: To be determined--- HCPOA will prefer SNF over hospice               Patient currently is not medically stable to d/c.   Difficult to place patient No  Consultants:  -Palliative care  Procedures:  See below for x-ray reports.  Antimicrobials:  None  Subjective: As of 04/03/21 ---currently requiring 12  L/min of Oxygen -Patient had difficulty feeding himself  today -I fed him his chicken, macaroni cheese and banana pudding he did well -Continues to cough Patient looks weak and tired -I called and updated this HCPOA  Objective: Vitals:   04/02/21 2036 04/02/21 2047 04/03/21 0512 04/03/21 1504  BP: 133/77  128/60 108/64  Pulse: 77  68 66  Resp: '20  20 18  '$ Temp: 97.6 F (36.4 C)  97.8 F (36.6 C) 97.6 F (36.4 C)  TempSrc: Oral  Oral Oral  SpO2: 90% 90% 91% 96%  Weight:   98.3 kg   Height:        Intake/Output Summary (Last 24 hours) at 04/03/2021 1549 Last data filed at 04/03/2021 0830 Gross per 24 hour  Intake 360 ml  Output 450 ml  Net -90 ml   Filed  Weights   03/26/21 1644 03/27/21 1000 04/03/21 0512  Weight: 97.1 kg 93.4 kg 98.3 kg    Examination:  Physical Exam Gen:- Resting, chronically ill-appearing HEENT:- [place].AT, No sclera icterus Nose- [place] 12 L/min Neck-Supple Neck,No JVD,.  Lungs-diminished air  movement, scattered wheezes/rhonchi bilaterally  CV- S1, S2 normal, RRR Abd-  +ve B.Sounds, Abd  Soft, No tenderness,    Extremity/Skin:- No  edema,   good pulses Psych-affect is flat, oriented x3 Neuro-Generalized weakness, no new focal deficits, no tremors   Data Reviewed: I have personally reviewed following labs and imaging studies  CBC: Recent Labs  Lab 03/29/21 0624 04/01/21 0423 04/03/21 0657  WBC 12.3* 14.7* 19.9*  HGB 15.1 13.7 16.2  HCT 45.2 40.5 48.5  MCV 93.8 92.7 93.4  PLT 204 179 134*    Basic Metabolic Panel: Recent Labs  Lab 03/29/21 0624 04/01/21 0423 04/03/21 0657  NA 140 138 140  K 4.3 3.9 4.7  CL 108 105 106  CO2 '23 25 26  '$ GLUCOSE 178* 162* 194*  BUN 34* 38* 37*  CREATININE 0.83 0.73 0.76  CALCIUM 9.1 8.7* 8.7*  MG 2.6*  --   --    GFR: Estimated Creatinine Clearance: 71.6 mL/min (by C-G formula based on SCr of 0.76 mg/dL).  Liver Function Tests: Recent Labs  Lab 03/29/21 0624 04/03/21 0657  AST 28 30  ALT 31 41  ALKPHOS 54 60  BILITOT 1.1 1.2  PROT 6.6 6.2*  ALBUMIN 3.0* 3.0*    CBG: Recent Labs  Lab 04/01/21 2107 04/02/21 1139 04/02/21 2115 04/03/21 0757 04/03/21 1215  GLUCAP 181* 117* 175* 178* 217*    No results found for this or any previous visit (from the past 240 hour(s)).   Radiology Studies: No results found.  Scheduled Meds:  ascorbic acid  500 mg Oral Daily   aspirin  81 mg Oral Daily   carvedilol  12.5 mg Oral BID   enoxaparin (LOVENOX) injection  40 mg Subcutaneous Q24H   ferrous sulfate  325 mg Oral Q breakfast   finasteride  5 mg Oral Daily   furosemide  20 mg Oral Once per day on Sun Tue Thu Sat   guaiFENesin  600 mg Oral BID   insulin aspart  0-5 Units Subcutaneous QHS   insulin aspart  0-6 Units Subcutaneous TID WC   insulin detemir  5 Units Subcutaneous QHS   losartan  100 mg Oral Daily   mouth rinse  15 mL Mouth Rinse BID   methylPREDNISolone (SOLU-MEDROL) injection  60 mg Intravenous Q8H   pantoprazole  40 mg Oral Daily   sertraline  50 mg Oral Daily   sodium chloride flush  3 mL  Intravenous Q12H   sodium chloride flush  3 mL Intravenous Q12H   vitamin B-12  1,000 mcg Per NG tube Daily   zinc sulfate  220 mg Oral Daily   Continuous Infusions:  sodium chloride      LOS: 8 days   Roxan Hockey, MD [facility]  To contact the attending provider between 7A-7P or the covering provider during after hours 7P-7A, please log into the web site [email or web address] and access using universal [place] password for that web site. If you do not have the password, please call the hospital operator.  04/03/2021, 3:49 PM

## 2021-04-04 DIAGNOSIS — U071 COVID-19: Secondary | ICD-10-CM | POA: Diagnosis not present

## 2021-04-04 DIAGNOSIS — J069 Acute upper respiratory infection, unspecified: Secondary | ICD-10-CM | POA: Diagnosis not present

## 2021-04-04 LAB — GLUCOSE, CAPILLARY
Glucose-Capillary: 106 mg/dL — ABNORMAL HIGH (ref 70–99)
Glucose-Capillary: 170 mg/dL — ABNORMAL HIGH (ref 70–99)
Glucose-Capillary: 127 mg/dL — ABNORMAL HIGH (ref 70–99)
Glucose-Capillary: 141 mg/dL — ABNORMAL HIGH (ref 70–99)

## 2021-04-04 MED ORDER — HALOPERIDOL LACTATE 5 MG/ML IJ SOLN
1.0000 mg | Freq: Four times a day (QID) | INTRAMUSCULAR | Status: AC | PRN
Start: 2021-04-04 — End: 2021-04-04
  Administered 2021-04-04: 1 mg via INTRAVENOUS
  Filled 2021-04-04: qty 1

## 2021-04-04 NOTE — Progress Notes (Signed)
PROGRESS NOTE    Manuel Le  [id number] DOB: Mar 03, 1927 DOA: 03/26/2021 PCP: Celene Squibb, MD    Chief Complaint  Patient presents with   Shortness of Breath    Brief Summary:- Manuel Le  is a 87 y.o. male reformed smoker with past medical history relevant for chronic A. fib/CAD, HTN, BPH --Patient was recently admitted to the hospital from 03/18/2021 through 03/22/2021 for COVID-pneumonia -was treated with remdesivir and steroids--- was discharged home with home health after patient and family refused SNF rehab --Patient is unvaccinated -Patient was readmitted on 03/26/2021 with worsening hypoxia  on 5 L of oxygen via nasal cannula -- On admission CTA chest on 03/26/2021 with extensive worsening COVID PNA,  No acute pulmonary embolism As of 04/04/21 ---currently requiring 12  L/min of Oxygen   Assessment & Plan: 1-Acute respiratory disease due to COVID-19 virus As of 04/04/21 ---currently requiring 12 L/min of Oxygen -Desaturates with attempt to wean oxygen down -Given images abnormalities at time of admission, increased inflammatory markers and concerns of worsening COVID infection patient will received treatment with Actemra on 03/27/21 -continue IV steroids, bronchodilators and supportive management -Prognosis is guarded,  -Patient is non-vaccinated. -continue oxygen supplementation and continue the use of flutter valve and IS.  2-Atrial fibrillation ([place]) -Rate controlled, c/n Coreg -Chronically on aspirin  3-chronic diastolic heart failure -Stable and compensated -Continue Coreg and losartan -Last ejection fraction 50 to 55% from echo 2019. Hypoxia and Dyspnea is mostly due to # 1  4-Hyperglycemia -No prior history of diabetes--recent A1c 5.9 -Probably associated to the use of his steroids -Continue sliding scale insulin and follow CBGs.  5-GI prophylaxis/GERD -Continue PPI especially while on steroids  6-Social/ethics --- -discussed with patient/family (HCPOA---Mr  Manuel Le)--- -DNR/DNI -Continue full scope of treatment -Palliative care consult appreciated -Overall prognosis is not encouraging given persistent and prolonged requirement for high flow oxygen  7-HTN--Improved BP control --- c/n Coreg to 12.5 mg twice daily -May use IV hydralazine as needed  DVT prophylaxis: Lovenox Code Status: DNR/DNI Family Communication: Next of skin Manuel Le has been updated Disposition:   Status is: Inpatient  Remains inpatient appropriate because: Worsening hypoxia--requiring IV steroids  Dispo: The patient is from: Home              Anticipated d/c is to: To be determined--- HCPOA will prefer SNF over hospice               Patient currently is not medically stable to d/c.   Difficult to place patient No  Consultants:  -Palliative care  Procedures:  See below for x-ray reports.  Antimicrobials:  None  Subjective: As of 04/04/21 ---currently requiring 12  L/min of Oxygen -Overnight progress note from Mertie Clause, RRT reviewed and noted -Respiratory struggles persist  Objective: Vitals:   04/03/21 2021 04/03/21 2059 04/04/21 0539 04/04/21 1200  BP:  (!) 146/68 (!) 155/76   Pulse:  68 71   Resp:  18 18   Temp:  97.8 F (36.6 C) 98.3 F (36.8 C)   TempSrc:  Oral Oral   SpO2: 92% 95% (!) 88% 97%  Weight:      Height:        Intake/Output Summary (Last 24 hours) at 04/04/2021 1346 Last data filed at 04/04/2021 0900 Gross per 24 hour  Intake 606 ml  Output 1150 ml  Net -544 ml   Filed Weights   03/26/21 1644 03/27/21 1000 04/03/21 0512  Weight: 97.1 kg 93.4 kg  98.3 kg    Examination:  Physical Exam Gen:- Resting, chronically ill-appearing HEENT:- [place].AT, No sclera icterus Nose- [place] 12 L/min Neck-Supple Neck,No JVD,.  Lungs-diminished air  movement, scattered wheezes/rhonchi bilaterally  CV- S1, S2 normal, RRR Abd-  +ve B.Sounds, Abd Soft, No tenderness,    Extremity/Skin:- No  edema,   good pulses Psych-affect  is flat, oriented x3 Neuro-Generalized weakness, no new focal deficits, no tremors   Data Reviewed: I have personally reviewed following labs and imaging studies  CBC: Recent Labs  Lab 03/29/21 0624 04/01/21 0423 04/03/21 0657  WBC 12.3* 14.7* 19.9*  HGB 15.1 13.7 16.2  HCT 45.2 40.5 48.5  MCV 93.8 92.7 93.4  PLT 204 179 134*    Basic Metabolic Panel: Recent Labs  Lab 03/29/21 0624 04/01/21 0423 04/03/21 0657  NA 140 138 140  K 4.3 3.9 4.7  CL 108 105 106  CO2 '23 25 26  '$ GLUCOSE 178* 162* 194*  BUN 34* 38* 37*  CREATININE 0.83 0.73 0.76  CALCIUM 9.1 8.7* 8.7*  MG 2.6*  --   --    GFR: Estimated Creatinine Clearance: 71.6 mL/min (by C-G formula based on SCr of 0.76 mg/dL).  Liver Function Tests: Recent Labs  Lab 03/29/21 0624 04/03/21 0657  AST 28 30  ALT 31 41  ALKPHOS 54 60  BILITOT 1.1 1.2  PROT 6.6 6.2*  ALBUMIN 3.0* 3.0*    CBG: Recent Labs  Lab 04/03/21 1215 04/03/21 1715 04/03/21 2250 04/04/21 0748 04/04/21 1129  GLUCAP 217* 306* 231* 106* 170*    No results found for this or any previous visit (from the past 240 hour(s)).   Radiology Studies: No results found.  Scheduled Meds:  ascorbic acid  500 mg Oral Daily   aspirin  81 mg Oral Daily   carvedilol  12.5 mg Oral BID   enoxaparin (LOVENOX) injection  40 mg Subcutaneous Q24H   ferrous sulfate  325 mg Oral Q breakfast   finasteride  5 mg Oral Daily   furosemide  20 mg Oral Once per day on Sun Tue Thu Sat   guaiFENesin  600 mg Oral BID   insulin aspart  0-5 Units Subcutaneous QHS   insulin aspart  0-6 Units Subcutaneous TID WC   insulin detemir  5 Units Subcutaneous QHS   losartan  100 mg Oral Daily   mouth rinse  15 mL Mouth Rinse BID   methylPREDNISolone (SOLU-MEDROL) injection  60 mg Intravenous Q8H   pantoprazole  40 mg Oral Daily   sertraline  50 mg Oral Daily   sodium chloride flush  3 mL Intravenous Q12H   sodium chloride flush  3 mL Intravenous Q12H   vitamin B-12   1,000 mcg Per NG tube Daily   zinc sulfate  220 mg Oral Daily   Continuous Infusions:  sodium chloride      LOS: 9 days   Roxan Hockey, MD [facility]  To contact the attending provider between 7A-7P or the covering provider during after hours 7P-7A, please log into the web site [email or web address] and access using universal [place] password for that web site. If you do not have the password, please call the hospital operator.  04/04/2021, 1:46 PM

## 2021-04-04 NOTE — Progress Notes (Signed)
Pt refused his lunch and stated he was tired. This nurse and nurse tech gave pt a bed bath and repositioned. Pt has a non productive cough and is SOB .  O2 saturation is 97% on  12L HFNC. PRN inhaler and cough medicine given. Pt is resting comfortably, call bell is within reach.

## 2021-04-05 ENCOUNTER — Inpatient Hospital Stay (HOSPITAL_COMMUNITY): Payer: PPO

## 2021-04-05 DIAGNOSIS — U071 COVID-19: Secondary | ICD-10-CM | POA: Diagnosis not present

## 2021-04-05 DIAGNOSIS — J069 Acute upper respiratory infection, unspecified: Secondary | ICD-10-CM | POA: Diagnosis not present

## 2021-04-05 LAB — CBC
HCT: 46.5 % (ref 39.0–52.0)
Hemoglobin: 15.3 g/dL (ref 13.0–17.0)
MCH: 30.9 pg (ref 26.0–34.0)
MCHC: 32.9 g/dL (ref 30.0–36.0)
MCV: 93.9 fL (ref 80.0–100.0)
Platelets: 74 10*3/uL — ABNORMAL LOW (ref 150–400)
RBC: 4.95 MIL/uL (ref 4.22–5.81)
RDW: 13.4 % (ref 11.5–15.5)
WBC: 20.8 10*3/uL — ABNORMAL HIGH (ref 4.0–10.5)
nRBC: 0 % (ref 0.0–0.2)

## 2021-04-05 LAB — GLUCOSE, CAPILLARY
Glucose-Capillary: 158 mg/dL — ABNORMAL HIGH (ref 70–99)
Glucose-Capillary: 144 mg/dL — ABNORMAL HIGH (ref 70–99)
Glucose-Capillary: 119 mg/dL — ABNORMAL HIGH (ref 70–99)
Glucose-Capillary: 146 mg/dL — ABNORMAL HIGH (ref 70–99)

## 2021-04-05 LAB — BASIC METABOLIC PANEL
Anion gap: 6 (ref 5–15)
BUN: 31 mg/dL — ABNORMAL HIGH (ref 8–23)
CO2: 28 mmol/L (ref 22–32)
Calcium: 8.9 mg/dL (ref 8.9–10.3)
Chloride: 107 mmol/L (ref 98–111)
Creatinine, Ser: 0.68 mg/dL (ref 0.61–1.24)
GFR, Estimated: >60 mL/min (ref >60)
Glucose, Bld: 164 mg/dL — ABNORMAL HIGH (ref 70–99)
Potassium: 4.4 mmol/L (ref 3.5–5.1)
Sodium: 141 mmol/L (ref 135–145)

## 2021-04-05 NOTE — Progress Notes (Signed)
PROGRESS NOTE    Manuel Le  [id number] DOB: Sep 25, 1926 DOA: 03/26/2021 PCP: Celene Squibb, MD    Chief Complaint  Patient presents with   Shortness of Breath    Brief Summary:- Manuel Le  is a 87 y.o. male reformed smoker with past medical history relevant for chronic A. fib/CAD, HTN, BPH --Patient was recently admitted to the hospital from 03/18/2021 through 03/22/2021 for COVID-pneumonia -was treated with remdesivir and steroids--- was discharged home with home health after patient and family refused SNF rehab --Patient is unvaccinated -Patient was readmitted on 03/26/2021 with worsening hypoxia  on 5 L of oxygen via nasal cannula -- On admission CTA chest on 03/26/2021 with extensive worsening COVID PNA,  No acute pulmonary embolism As of 04/05/21 ---currently requiring  10 to 12  L/min of Oxygen   Assessment & Plan: 1-Acute respiratory disease due to COVID-19 virus As of 04/05/21 ---currently requiring  10 to 12  L/min of Oxygen -Desaturates with attempt to wean oxygen down -Given images abnormalities at time of admission, increased inflammatory markers and concerns of worsening COVID infection patient will received treatment with Actemra on 03/27/21 -continue IV steroids, bronchodilators and supportive management -Prognosis is guarded,  -Patient is non-vaccinated. -continue oxygen supplementation and continue the use of flutter valve and IS. -Persistent leukocytosis is due to steroids  2-Atrial fibrillation ([facility]) -Rate controlled, c/n Coreg -Chronically on aspirin  3-chronic diastolic heart failure -Stable and compensated -Continue Coreg and losartan -Last ejection fraction 50 to 55% from echo 2019. Hypoxia and Dyspnea is mostly due to # 1  4-Hyperglycemia -No prior history of diabetes--recent A1c 5.9 -Probably associated to the use of steroids -Continue sliding scale insulin and follow CBGs.  5-GI prophylaxis/GERD -Continue PPI especially while on  steroids  6-Social/ethics --- -discussed with patient/family (HCPOA---[name])--- -DNR/DNI -Continue full scope of treatment -Palliative care consult appreciated -Overall prognosis is not encouraging given persistent and prolonged requirement for high flow oxygen  7-HTN--Improved BP control --- c/n Coreg to 12.5 mg twice daily -May use IV hydralazine as needed  8) new onset thrombocytopenia--- monitor closely  -consider discontinuing Lovenox if platelets drop below 50 K  -Patient is high risk for DVT due to bedbound status and hypercoagulable state induced by COVID infection  9)Dispo--Patient cannot go to SNF until his oxygen requirement is less than 6 L As of 04/05/21 ---currently requiring  10 to 12  L/min of Oxygen HCPOA is not ready to transition to hospice  DVT prophylaxis: Lovenox Code Status: DNR/DNI Family Communication: Next of skin [name] has been updated Disposition:   Status is: Inpatient  Remains inpatient appropriate because: Worsening hypoxia--requiring IV steroids  Dispo: The patient is from: Home              Anticipated d/c is to: To be determined--- HCPOA will prefer SNF over hospice               Patient currently is not medically stable to d/c.   Difficult to place patient No -Patient cannot go to SNF until his oxygen requirement is less than 6 L As of 04/05/21 ---currently requiring  10 to 12  L/min of Oxygen HCPOA is not ready to transition to hospice  Consultants:  -Palliative care  Procedures:  See below for x-ray reports.  Antimicrobials:  None  Subjective: As of 04/05/21 ---currently requiring  10 to 12  L/min of Oxygen -Cough and dyspnea persist  Objective: Vitals:   04/04/21 2010 04/04/21 2025 04/05/21  0428 04/05/21 1000  BP: (!) 173/71  115/69   Pulse: 64  74   Resp: 18  18   Temp: 97.9 F (36.6 C)  98.2 F (36.8 C)   TempSrc:      SpO2: 93% 95% 90% 92%  Weight:      Height:        Intake/Output Summary (Last  24 hours) at 04/05/2021 1849 Last data filed at 04/05/2021 1700 Gross per 24 hour  Intake 340 ml  Output 800 ml  Net -460 ml   Filed Weights   03/26/21 1644 03/27/21 1000 04/03/21 0512  Weight: 97.1 kg 93.4 kg 98.3 kg    Examination:  Physical Exam Gen:- Resting, chronically ill-appearing HEENT:- [place].AT, No sclera icterus Nose- [place] 12 L/min Neck-Supple Neck,No JVD,.  Lungs-diminished air  movement, scattered wheezes/rhonchi bilaterally  CV- S1, S2 normal, RRR Abd-  +ve B.Sounds, Abd Soft, No tenderness,    Extremity/Skin:- No  edema,   good pulses Psych-affect is flat, oriented x3 Neuro-Generalized weakness, no new focal deficits, no tremors   Data Reviewed: I have personally reviewed following labs and imaging studies  CBC: Recent Labs  Lab 04/01/21 0423 04/03/21 0657 04/05/21 0404  WBC 14.7* 19.9* 20.8*  HGB 13.7 16.2 15.3  HCT 40.5 48.5 46.5  MCV 92.7 93.4 93.9  PLT 179 134* 74*    Basic Metabolic Panel: Recent Labs  Lab 04/01/21 0423 04/03/21 0657 04/05/21 0404  NA 138 140 141  K 3.9 4.7 4.4  CL 105 106 107  CO2 '25 26 28  '$ GLUCOSE 162* 194* 164*  BUN 38* 37* 31*  CREATININE 0.73 0.76 0.68  CALCIUM 8.7* 8.7* 8.9   GFR: Estimated Creatinine Clearance: 71.6 mL/min (by C-G formula based on SCr of 0.68 mg/dL).  Liver Function Tests: Recent Labs  Lab 04/03/21 0657  AST 30  ALT 41  ALKPHOS 60  BILITOT 1.2  PROT 6.2*  ALBUMIN 3.0*    CBG: Recent Labs  Lab 04/04/21 1732 04/04/21 2012 04/05/21 0749 04/05/21 1159 04/05/21 1721  GLUCAP 127* 141* 158* 144* 119*    No results found for this or any previous visit (from the past 240 hour(s)).   Radiology Studies: DG CHEST PORT 1 VIEW  Result Date: 04/05/2021 CLINICAL DATA:  Shortness of breath. EXAM: PORTABLE CHEST 1 VIEW COMPARISON:  03/26/2021 FINDINGS: Decreased lung volumes. Persistent bilateral upper and lower lung zone interstitial opacities. When compared with the previous exam there is  been worsening aeration to the left upper lobe. IMPRESSION: Persistent bilateral interstitial opacities with worsening aeration to the left upper lobe. Electronically Signed   By: Kerby Moors M.D.   On: 04/05/2021 10:37    Scheduled Meds:  ascorbic acid  500 mg Oral Daily   aspirin  81 mg Oral Daily   carvedilol  12.5 mg Oral BID   enoxaparin (LOVENOX) injection  40 mg Subcutaneous Q24H   ferrous sulfate  325 mg Oral Q breakfast   finasteride  5 mg Oral Daily   furosemide  20 mg Oral Once per day on Sun Tue Thu Sat   guaiFENesin  600 mg Oral BID   insulin aspart  0-5 Units Subcutaneous QHS   insulin aspart  0-6 Units Subcutaneous TID WC   insulin detemir  5 Units Subcutaneous QHS   losartan  100 mg Oral Daily   mouth rinse  15 mL Mouth Rinse BID   methylPREDNISolone (SOLU-MEDROL) injection  60 mg Intravenous Q8H   pantoprazole  40 mg  Oral Daily   sertraline  50 mg Oral Daily   sodium chloride flush  3 mL Intravenous Q12H   sodium chloride flush  3 mL Intravenous Q12H   vitamin B-12  1,000 mcg Per NG tube Daily   zinc sulfate  220 mg Oral Daily   Continuous Infusions:  sodium chloride      LOS: 10 days   Roxan Hockey, MD [facility]  To contact the attending provider between 7A-7P or the covering provider during after hours 7P-7A, please log into the web site [email or web address] and access using universal [place] password for that web site. If you do not have the password, please call the hospital operator.  04/05/2021, 6:49 PM

## 2021-04-05 NOTE — NC FL2 (Signed)
[place] LEVEL OF CARE SCREENING TOOL     IDENTIFICATION  Patient Name: Manuel Le Birthdate: [birth year] Sex: male Admission Date (Current Location): 03/26/2021  [facility] and [place] Number:  [employer] and Address:  [facility] [address] [place]      Provider Number: [id number]  Attending Physician Name and Address:  Roxan Hockey, MD  Relative Name and Phone Number:  Delford Field (Other)   [phone]    Current Level of Care: Hospital Recommended Level of Care:   Prior Approval Number:    Date Approved/Denied:   PASRR Number: [id number]  Discharge Plan: SNF    Current Diagnoses: Patient Active Problem List   Diagnosis Date Noted   Acute respiratory disease due to COVID-19 virus 03/26/2021   Acute on chronic respiratory failure with hypoxia ([place]) 03/26/2021   Pneumonia due to COVID-19 virus 03/18/2021   Acute respiratory failure due to COVID-19 ([place]) 03/18/2021   Chronic diastolic CHF (congestive heart failure) ([place]) 03/18/2021   Generalized weakness 03/18/2021   Truncal ataxia 10/20/2020   Dizzy 10/20/2020   TIA (transient ischemic attack) 10/20/2020   Acute respiratory failure with hypoxia ([place])    Influenza with pneumonia    Palliative care encounter    Sepsis ([place]) 09/25/2017   Fever 09/25/2017   UTI (urinary tract infection) 09/25/2017   Abnormal liver function 09/25/2017   Tachycardia 09/25/2017   Influenza A    Pressure ulcer 09/14/2015   Zenker's diverticulum 09/09/2015   Bladder cancer ([place]) 07/14/2015   CAD (coronary artery disease) 07/24/2014   S/P coronary artery stent placement (RCA bare metal stent on 07/08/14) 07/24/2014   Exertional angina ([place]) 07/08/2014   Encounter for therapeutic drug monitoring 09/12/2013   Atrial fibrillation ([place]) 09/08/2013   HTN (hypertension) 09/08/2013   Fatigue 09/08/2013   Anemia 06/26/2012    Orientation RESPIRATION BLADDER Height &  Weight     Self  O2 (11L HFNC. Patient will not d/c until he is on 6L or less.) Incontinent Weight: 216 lb 11.4 oz (98.3 kg) Height:  6' (182.9 cm)  BEHAVIORAL SYMPTOMS/MOOD NEUROLOGICAL BOWEL NUTRITION STATUS      Incontinent Diet (regular)  AMBULATORY STATUS COMMUNICATION OF NEEDS Skin   Extensive Assist Verbally Normal                       Personal Care Assistance Level of Assistance  Bathing, Feeding, Dressing Bathing Assistance: Maximum assistance Feeding assistance: Independent Dressing Assistance: Maximum assistance     Functional Limitations Info  Sight, Hearing, Speech Sight Info: Adequate Hearing Info: Adequate Speech Info: Adequate    SPECIAL CARE FACTORS FREQUENCY  PT (By licensed PT), Speech therapy     PT Frequency: 5x/week       Speech Therapy Frequency: 3x/week      Contractures Contractures Info: Not present    Additional Factors Info  Code Status, Allergies Code Status Info: DNR Allergies Info: NKA           Current Medications (04/05/2021):  This is the current hospital active medication list Current Facility-Administered Medications  Medication Dose Route Frequency Provider Last Rate Last Admin   0.9 %  sodium chloride infusion  250 mL Intravenous PRN Emokpae, Courage, MD       acetaminophen (TYLENOL) tablet 650 mg  650 mg Oral Q6H PRN Emokpae, Courage, MD       Or   acetaminophen (TYLENOL) suppository 650 mg  650 mg  Rectal Q6H PRN Emokpae, Courage, MD       albuterol (VENTOLIN HFA) 108 (90 Base) MCG/ACT inhaler 2 puff  2 puff Inhalation Q4H PRN Denton Brick, Courage, MD   2 puff at 04/04/21 1427   ascorbic acid (VITAMIN C) tablet 500 mg  500 mg Oral Daily Emokpae, Courage, MD   500 mg at 04/05/21 0854   aspirin chewable tablet 81 mg  81 mg Oral Daily Emokpae, Courage, MD   81 mg at 04/05/21 0854   bisacodyl (DULCOLAX) suppository 10 mg  10 mg Rectal Daily PRN Emokpae, Courage, MD       carvedilol (COREG) tablet 12.5 mg  12.5 mg Oral BID  Emokpae, Courage, MD   12.5 mg at 04/05/21 0854   enoxaparin (LOVENOX) injection 40 mg  40 mg Subcutaneous Q24H Emokpae, Courage, MD   40 mg at 04/04/21 2128   ferrous sulfate tablet 325 mg  325 mg Oral Q breakfast Emokpae, Courage, MD   325 mg at 04/05/21 0854   finasteride (PROSCAR) tablet 5 mg  5 mg Oral Daily Emokpae, Courage, MD   5 mg at 04/05/21 0854   furosemide (LASIX) tablet 20 mg  20 mg Oral Once per day on Sun Tue Thu Sat Roxan Hockey, MD   20 mg at 04/05/21 0854   guaiFENesin (MUCINEX) 12 hr tablet 600 mg  600 mg Oral BID Emokpae, Courage, MD   600 mg at 04/05/21 0854   guaiFENesin-dextromethorphan (ROBITUSSIN DM) 100-10 MG/5ML syrup 10 mL  10 mL Oral Q4H PRN Emokpae, Courage, MD   10 mL at 04/04/21 1430   hydrALAZINE (APRESOLINE) injection 10 mg  10 mg Intravenous Q6H PRN Emokpae, Courage, MD   10 mg at 03/30/21 1423   insulin aspart (novoLOG) injection 0-5 Units  0-5 Units Subcutaneous QHS Emokpae, Courage, MD   2 Units at 04/03/21 2319   insulin aspart (novoLOG) injection 0-6 Units  0-6 Units Subcutaneous TID WC Roxan Hockey, MD   1 Units at 04/05/21 0857   insulin detemir (LEVEMIR) injection 5 Units  5 Units Subcutaneous QHS Barton Dubois, MD   5 Units at 04/04/21 2129   losartan (COZAAR) tablet 100 mg  100 mg Oral Daily Emokpae, Courage, MD   100 mg at 04/05/21 0854   MEDLINE mouth rinse  15 mL Mouth Rinse BID Emokpae, Courage, MD   15 mL at 04/05/21 0857   methylPREDNISolone sodium succinate (SOLU-MEDROL) 125 mg/2 mL injection 60 mg  60 mg Intravenous Q8H Barton Dubois, MD   60 mg at 04/05/21 0853   nitroGLYCERIN (NITROSTAT) SL tablet 0.4 mg  0.4 mg Sublingual Q5 min PRN Emokpae, Courage, MD       ondansetron (ZOFRAN) tablet 4 mg  4 mg Oral Q6H PRN Emokpae, Courage, MD       Or   ondansetron (ZOFRAN) injection 4 mg  4 mg Intravenous Q6H PRN Emokpae, Courage, MD       pantoprazole (PROTONIX) EC tablet 40 mg  40 mg Oral Daily Emokpae, Courage, MD   40 mg at 04/05/21 0854    polyethylene glycol (MIRALAX / GLYCOLAX) packet 17 g  17 g Oral Daily PRN Emokpae, Courage, MD       sertraline (ZOLOFT) tablet 50 mg  50 mg Oral Daily Emokpae, Courage, MD   50 mg at 04/05/21 0854   sodium chloride flush (NS) 0.9 % injection 3 mL  3 mL Intravenous Q12H Emokpae, Courage, MD   3 mL at 04/05/21 0857   sodium chloride  flush (NS) 0.9 % injection 3 mL  3 mL Intravenous Q12H Emokpae, Courage, MD   3 mL at 04/05/21 0857   sodium chloride flush (NS) 0.9 % injection 3 mL  3 mL Intravenous PRN Denton Brick, Courage, MD   3 mL at 04/04/21 0954   traZODone (DESYREL) tablet 50 mg  50 mg Oral QHS PRN Emokpae, Courage, MD   50 mg at 04/01/21 2250   vitamin B-12 (CYANOCOBALAMIN) tablet 1,000 mcg  1,000 mcg Per NG tube Daily Emokpae, Courage, MD   1,000 mcg at 04/05/21 0854   zinc sulfate capsule 220 mg  220 mg Oral Daily Emokpae, Courage, MD   220 mg at 04/05/21 [id number]     Discharge Medications: Please see discharge summary for a list of discharge medications.  Relevant Imaging Results:  Relevant Lab Results:   Additional Information [id number]. Patient COVID+ on 03/18/21, he will be over 21 days post initial infection at discharge. Patient will d/c on 6L or less oxygen.  Dusan Lipford, Clydene Pugh, LCSW

## 2021-04-05 NOTE — TOC Progression Note (Signed)
Transition of Care [facility]) - Progression Note    Patient Details  Name: CAS MERGEL MRN: [id number] Date of Birth: [birth year]  [facility]) CM/SW Contact  Ihor Gully, LCSW Phone Number: 04/05/2021, 3:04 PM  Clinical Narrative:    Spoke with POA, [name]. Referral sent to requested SNF.      Barriers to Discharge: Continued Medical Work up  Expected Discharge Plan and Services                                                 Social Determinants of Health (SDOH) Interventions    Readmission Risk Interventions Readmission Risk Prevention Plan 03/21/2021  Transportation Screening Complete  PCP or Specialist Appt within 3-5 Days Complete  HRI or [place] Complete  Social Work Consult for [place] Planning/Counseling Complete  Palliative Care Screening Not Applicable  Medication Review [employer]) Complete  Some recent data might be hidden

## 2021-04-06 DIAGNOSIS — U071 COVID-19: Secondary | ICD-10-CM | POA: Diagnosis not present

## 2021-04-06 DIAGNOSIS — J069 Acute upper respiratory infection, unspecified: Secondary | ICD-10-CM | POA: Diagnosis not present

## 2021-04-06 LAB — CBC
HCT: 44.9 % (ref 39.0–52.0)
Hemoglobin: 15.1 g/dL (ref 13.0–17.0)
MCH: 31.7 pg (ref 26.0–34.0)
MCHC: 33.6 g/dL (ref 30.0–36.0)
MCV: 94.1 fL (ref 80.0–100.0)
Platelets: 41 10*3/uL — ABNORMAL LOW (ref 150–400)
RBC: 4.77 MIL/uL (ref 4.22–5.81)
RDW: 13.4 % (ref 11.5–15.5)
WBC: 18 10*3/uL — ABNORMAL HIGH (ref 4.0–10.5)
nRBC: 0 % (ref 0.0–0.2)

## 2021-04-06 LAB — GLUCOSE, CAPILLARY
Glucose-Capillary: 157 mg/dL — ABNORMAL HIGH (ref 70–99)
Glucose-Capillary: 151 mg/dL — ABNORMAL HIGH (ref 70–99)
Glucose-Capillary: 135 mg/dL — ABNORMAL HIGH (ref 70–99)
Glucose-Capillary: 147 mg/dL — ABNORMAL HIGH (ref 70–99)

## 2021-04-06 MED ORDER — INSULIN ASPART 100 UNIT/ML IJ SOLN: 0.0000 [IU] | Freq: Every day | INTRAMUSCULAR | 11 refills | Status: DC

## 2021-04-06 MED ORDER — INSULIN DETEMIR 100 UNIT/ML ~~LOC~~ SOLN: 5.0000 [IU] | Freq: Every day | SUBCUTANEOUS | 11 refills | Status: DC

## 2021-04-06 MED ORDER — GUAIFENESIN-DM 100-10 MG/5ML PO SYRP: 10.0000 mL | ORAL_SOLUTION | Freq: Three times a day (TID) | ORAL | 0 refills | Status: DC

## 2021-04-06 MED ORDER — METHYLPREDNISOLONE 4 MG PO TBPK: ORAL_TABLET | ORAL | 0 refills | Status: DC

## 2021-04-06 NOTE — TOC Progression Note (Signed)
Transition of Care [facility]) - Progression Note    Patient Details  Name: Manuel Le MRN: [id number] Date of Birth: Jan 01, 1927  [facility]) CM/SW Contact  Ihor Gully, LCSW Phone Number: 04/06/2021, 1:37 PM  Clinical Narrative:    Spoke with POA, [name], regarding bed offers. Agreeable to referring patient to additional facilities. Patient referred to additional facilities. Bed offers provided to [name] and offer accepted at [facility]. Authorization started with [employer].      Barriers to Discharge: Continued Medical Work up  Expected Discharge Plan and Services           Expected Discharge Date: 04/06/21                                     Social Determinants of Health (SDOH) Interventions    Readmission Risk Interventions Readmission Risk Prevention Plan 03/21/2021  Transportation Screening Complete  PCP or Specialist Appt within 3-5 Days Complete  HRI or [place] Complete  Social Work Consult for [place] Planning/Counseling Complete  Palliative Care Screening Not Applicable  Medication Review Press photographer) Complete  Some recent data might be hidden

## 2021-04-06 NOTE — Discharge Summary (Signed)
Physician Discharge Summary [facility]    Patient: Manuel Le                   Admit date: 03/26/2021   DOB: [birth year]             Discharge date:04/06/2021/10:36 AM [id number]                          PCP: Celene Squibb, MD  Disposition: SNF    Recommendations for Outpatient Follow-up:   Follow up: Follow-up with PCP within next 2 to 3 weeks,  recommending palliative care consultation and SNF, if patient progressive decline hospice consultation recommended Currently satting 98% on 5 L oxygen, O2 may be reduced goal to maintain O2 sat 88-92%  Discharge Condition: Stable   Code Status:   Code Status: DNR  Diet recommendation: Diabetic diet   Discharge Diagnoses:    Principal Problem:   Acute respiratory disease due to COVID-19 virus Active Problems:   Atrial fibrillation ([place])   HTN (hypertension)   S/P coronary artery stent placement (RCA bare metal stent on 07/08/14)   Acute on chronic respiratory failure with hypoxia ([place])   History of Present Illness/ Hospital Course [name] Summary:   Manuel Le  is a 87 y.o. male reformed smoker with past medical history relevant for chronic A. fib/CAD, HTN, BPH --Patient was recently admitted to the hospital from 03/18/2021 through 03/22/2021 for COVID-pneumonia -was treated with remdesivir and steroids--- was discharged home with home health after patient and family refused SNF rehab --Patient is unvaccinated -Patient was readmitted on 03/26/2021 with worsening hypoxia  on 5 L of oxygen via nasal cannula Back to baseline now on this admission was requiring as high as 10-12 L of oxygen -- On admission CTA chest on 03/26/2021 with extensive worsening COVID PNA,  No acute pulmonary embolism    1-Acute respiratory disease due to COVID-19 virus -Patient's O2 demand has improved from 10/12 L now on 5 L of oxygen satting 98% The goal will be to further reduce O2 supplements with a goal O2 sat 88-92% -Given images  abnormalities at time of admission, increased inflammatory markers and concerns of worsening COVID infection patient will received treatment with Actemra on 03/27/21 -Has been on IV steroids Solu-Medrol will be switched to p.o. Medrol Dosepak taper -Prognosis is guarded,  -Patient is non-vaccinated. -continue oxygen supplementation and continue the use of flutter valve and IS. -Persistent leukocytosis is due to steroids-no signs of overt infection or sepsis   2-Atrial fibrillation (HCC) -Rate controlled, c/n Coreg -Chronically on aspirin   3-chronic diastolic heart failure -Stable and compensated -Continue Coreg and losartan -Last ejection fraction 50 to 55% from echo 2019. Hypoxia and Dyspnea is mostly due to # 1   4-Hyperglycemia -No prior history of diabetes--recent A1c 5.9 -Probably associated to the use of steroids -Continue sliding scale insulin and follow CBGs.,  Continue Levemir   5-GI prophylaxis/GERD -Continue PPI especially while on steroids   6-Social/ethics --- -discussed with patient/family (HCPOA---[name])--- -DNR/DNI -Continue full scope of treatment -Palliative care was consulted -Overall prognosis is not encouraging given persistent and prolonged requirement of oxygen, respiratory failure, comorbidities   7-HTN--Improved BP control --- c/n Coreg to 12.5 mg twice daily  8) new onset thrombocytopenia--- monitor closely  -Patient is high risk for DVT due to bedbound status and hypercoagulable state induced by COVID infection Recommending continue SCDs   9)Dispo-  SNF - Oxygen  requirement 5 L now    Code Status: DNR/DNI Family Communication: Next of skin [name] has been updated Disposition:     Dispo: The patient is from: Home              Anticipated d/c is to: -- HCPOA will prefer SNF over hospice    Discharge Instructions:   Discharge Instructions     Activity as tolerated - No restrictions   Complete by: As directed    Call MD  for:  persistant nausea and vomiting   Complete by: As directed    Call MD for:  temperature >100.4   Complete by: As directed    Diet - low sodium heart healthy   Complete by: As directed    Discharge instructions   Complete by: As directed    Continue O2 supplements, currently on 5 L of oxygen satting 98%, goal to keep the patient greater 92% may taper down O2 via nasal cannula... Keep O2 sat 88-92% at all times. Recommending follow-up palliative care consultation at SNF facility, effusion progressive decline recommending hospice   Increase activity slowly   Complete by: As directed         Medication List     STOP taking these medications    losartan 100 MG tablet Commonly known as: COZAAR   predniSONE 20 MG tablet Commonly known as: DELTASONE   vitamin B-12 1000 MCG tablet Commonly known as: CYANOCOBALAMIN       TAKE these medications    albuterol 108 (90 Base) MCG/ACT inhaler Commonly known as: VENTOLIN HFA Inhale 2 puffs into the lungs every 4 (four) hours as needed for shortness of breath or wheezing.   ascorbic acid 500 MG tablet Commonly known as: VITAMIN C Take 500 mg by mouth daily.   aspirin 81 MG tablet Take 1 tablet (81 mg total) by mouth daily.   carvedilol 3.125 MG tablet Commonly known as: COREG Take 3.125 mg by mouth 2 (two) times daily.   Centrum Silver Adult 50+ Tabs Take 1 tablet by mouth every morning.   ferrous sulfate 325 (65 FE) MG EC tablet Take 325 mg by mouth daily with breakfast.   finasteride 5 MG tablet Commonly known as: PROSCAR Take 5 mg by mouth daily.   Fish Oil 1000 MG Caps Take 1 capsule by mouth daily.   furosemide 20 MG tablet Commonly known as: LASIX Take 20 mg by mouth See admin instructions.   guaiFENesin-dextromethorphan 100-10 MG/5ML syrup Commonly known as: ROBITUSSIN DM Take 10 mLs by mouth every 8 (eight) hours for 5 days. What changed:  when to take this reasons to take this   insulin aspart 100  UNIT/ML injection Commonly known as: novoLOG Inject 0-5 Units into the skin at bedtime.   insulin detemir 100 UNIT/ML injection Commonly known as: LEVEMIR Inject 0.05 mLs (5 Units total) into the skin at bedtime.   melatonin 5 MG Tabs Take 5 mg by mouth at bedtime. Take with 3 mg What changed: Another medication with the same name was removed. Continue taking this medication, and follow the directions you see here.   methylPREDNISolone 4 MG Tbpk tablet Commonly known as: MEDROL DOSEPAK Medrol Dosepak take as instructed   nitroGLYCERIN 0.4 MG SL tablet Commonly known as: NITROSTAT Place 1 tablet (0.4 mg total) under the tongue every 5 (five) minutes as needed for chest pain.   Osteo Bi-Flex Adv Joint Shield Tabs Take 1 tablet by mouth daily.   potassium chloride 10 MEQ  tablet Commonly known as: KLOR-CON Take 10 mEq by mouth See admin instructions.   sertraline 50 MG tablet Commonly known as: ZOLOFT Take 50 mg by mouth daily.   Vitamin D3 25 MCG (1000 UT) Caps Take 1 capsule by mouth daily.        No Known Allergies   Procedures /Studies:   CT Angio Chest PE W and/or Wo Contrast  Result Date: 03/26/2021 CLINICAL DATA:  PE suspected, recent COVID, home oxygen EXAM: CT ANGIOGRAPHY CHEST WITH CONTRAST TECHNIQUE: Multidetector CT imaging of the chest was performed using the standard protocol during bolus administration of intravenous contrast. Multiplanar CT image reconstructions and MIPs were obtained to evaluate the vascular anatomy. CONTRAST:  124m OMNIPAQUE IOHEXOL 350 MG/ML SOLN COMPARISON:  03/18/2021 FINDINGS: Cardiovascular: Satisfactory opacification of the pulmonary arteries to the segmental level. No evidence of pulmonary embolism. Cardiomegaly. Three-vessel coronary artery calcifications and/or stents. No pericardial effusion. Enlargement of the main pulmonary artery measuring up to 3.6 cm. Aortic atherosclerosis. Mediastinum/Nodes: No enlarged mediastinal, hilar,  or axillary lymph nodes. Thyroid gland, trachea, and esophagus demonstrate no significant findings. Lungs/Pleura: Extensive peripheral ground-glass airspace opacity present bilaterally, predominantly in the dependent lungs, slightly worsened compared to prior examination. There is likely some degree of underlying scarring of the bilateral lung bases. Upper Abdomen: No acute abnormality. Musculoskeletal: No chest wall abnormality. No acute or significant osseous findings. Review of the MIP images confirms the above findings. IMPRESSION: 1. Negative examination for pulmonary embolism. 2. Extensive peripheral ground-glass airspace opacity present bilaterally, predominantly in the dependent lungs, slightly worsened compared to prior examination and in keeping with COVID-19 airspace disease. 3. Cardiomegaly and coronary artery disease. 4. Enlargement of the main pulmonary artery, as can be seen in pulmonary hypertension. Aortic Atherosclerosis (ICD10-I70.0). Electronically Signed   By: AEddie CandleM.D.   On: 03/26/2021 20:05   CT Angio Chest PE W and/or Wo Contrast  Result Date: 03/18/2021 CLINICAL DATA:  87year old male with shortness of breath, COVID positive. EXAM: CT ANGIOGRAPHY CHEST WITH CONTRAST TECHNIQUE: Multidetector CT imaging of the chest was performed using the standard protocol during bolus administration of intravenous contrast. Multiplanar CT image reconstructions and MIPs were obtained to evaluate the vascular anatomy. CONTRAST:  100 mL Omnipaque 350, intravenous COMPARISON:  09/25/2017 FINDINGS: Cardiovascular: Satisfactory opacification of the pulmonary arteries to the segmental level. No evidence of pulmonary embolism. Normal heart size. No pericardial effusion. Coronary, aortic valvular, mitral annular, and aortic atherosclerotic calcifications are noted. Mediastinum/Nodes: No enlarged mediastinal, hilar, or axillary lymph nodes. Thyroid gland, trachea, and esophagus demonstrate no significant  findings. Lungs/Pleura: Similar appearing bibasilar peribronchovascular thickening with associated dependent atelectasis and scattered consolidative opacities. No new consolidative opacities, significant pleural effusion, pneumothorax. Mild emphysematous changes are noted. Mild Upper Abdomen: The visualized upper abdomen is within normal limits. Musculoskeletal: No chest wall abnormality. No acute or significant osseous findings. Review of the MIP images confirms the above findings. IMPRESSION: Vascular: 1. No evidence of pulmonary embolism. 2. Coronary and aortic atherosclerosis (ICD10-I70.0). Non-Vascular: 1. Bibasilar bronchial wall thickening and associated atelectasis, similar to 2019 comparison, likely indicative of sequela of chronic aspiration. Superimposed pneumonia could appear similarly. 2.  Mild emphysema (ICD10-J43.9). DRuthann Cancer MD [facility] Electronically Signed   By: DRuthann CancerMD   On: 03/18/2021 13:44   DG CHEST PORT 1 VIEW  Result Date: 04/05/2021 CLINICAL DATA:  Shortness of breath. EXAM: PORTABLE CHEST 1 VIEW COMPARISON:  03/26/2021 FINDINGS: Decreased lung volumes. Persistent bilateral upper  and lower lung zone interstitial opacities. When compared with the previous exam there is been worsening aeration to the left upper lobe. IMPRESSION: Persistent bilateral interstitial opacities with worsening aeration to the left upper lobe. Electronically Signed   By: Kerby Moors M.D.   On: 04/05/2021 10:37   DG Chest Port 1 View  Result Date: 03/26/2021 CLINICAL DATA:  Shortness of breath. EXAM: PORTABLE CHEST 1 VIEW COMPARISON:  03/18/2021. FINDINGS: Low lung volumes with mildly increased diffuse interstitial opacities. No visible pleural effusions or pneumothorax. Right lung apex is partially obscured by overlapping neck/face. Stable cardiomediastinal silhouette. Calcific atherosclerosis of the aorta. No acute osseous  abnormality. IMPRESSION: Low lung volumes with mildly increased diffuse interstitial opacities, potentially mild interstitial edema or atypical infection. Dedicated PA and lateral radiographs may be helpful if the patient is able. Electronically Signed   By: Margaretha Sheffield MD   On: 03/26/2021 17:39   DG Chest Port 1 View  Result Date: 03/18/2021 CLINICAL DATA:  87 year old male with shortness of breath for 1 week. Positive COVID-19. EXAM: PORTABLE CHEST 1 VIEW COMPARISON:  Chest radiographs 07/27/2019 and earlier. FINDINGS: Portable AP semi upright view at 1039 hours. Lower lung volumes. Indistinct increased lung base opacity greater on the left. Stable cardiac size and mediastinal contours. No pneumothorax or pulmonary edema. No definite pleural effusion. No other confluent pulmonary opacity. Visualized tracheal air column is within normal limits. Paucity of bowel gas in the upper abdomen. No acute osseous abnormality identified. IMPRESSION: Low lung volumes with nonspecific bibasilar opacity greater on the left, possibly atelectasis but left lung base pneumonia not excluded. PA and lateral views would be helpful when feasible. Electronically Signed   By: Genevie Ann M.D.   On: 03/18/2021 11:02    Subjective:   Patient was seen and examined 04/06/2021, 10:36 AM Patient stable today. No acute distress.  No issues overnight Stable for discharge.  Discharge Exam:    Vitals:   04/05/21 2000 04/05/21 2022 04/05/21 2310 04/06/21 0540  BP:  (!) 173/81  (!) 142/60  Pulse:  65  65  Resp:  18  19  Temp:  98.1 F (36.7 C)  98 F (36.7 C)  TempSrc:      SpO2: 92% 90% 92% 95%  Weight:      Height:        General: Pt lying comfortably in bed & appears in no obvious distress.... Pleasantly confused Cardiovascular: S1 & S2 heard, RRR, S1/S2 +. No murmurs, rubs, gallops or clicks. No JVD or pedal edema. Respiratory: Mild shortness of breath, on 5 L of oxygen, satting 98%  clear to auscultation without  wheezing, rhonchi or crackles. No increased work of breathing. Abdominal:  Non-distended, non-tender & soft. No organomegaly or masses appreciated. Normal bowel sounds heard. CNS: Alert and oriented. No focal deficits. Extremities: no edema, no cyanosis      The results of significant diagnostics from this hospitalization (including imaging, microbiology, ancillary and laboratory) are listed below for reference.      Microbiology:   No results found for this or any previous visit (from the past 240 hour(s)).   Labs:   CBC: Recent Labs  Lab 04/01/21 0423 04/03/21 0657 04/05/21 0404 04/06/21 0702  WBC 14.7* 19.9* 20.8* 18.0*  HGB 13.7 16.2 15.3 15.1  HCT 40.5 48.5 46.5 44.9  MCV 92.7 93.4 93.9 94.1  PLT 179 134* 74* 41*   Basic Metabolic Panel: Recent Labs  Lab 04/01/21 0423 04/03/21 0657 04/05/21 0404  NA  138 140 141  K 3.9 4.7 4.4  CL 105 106 107  CO2 '25 26 28  '$ GLUCOSE 162* 194* 164*  BUN 38* 37* 31*  CREATININE 0.73 0.76 0.68  CALCIUM 8.7* 8.7* 8.9   Liver Function Tests: Recent Labs  Lab 04/03/21 0657  AST 30  ALT 41  ALKPHOS 60  BILITOT 1.2  PROT 6.2*  ALBUMIN 3.0*   BNP (last 3 results) Recent Labs    03/26/21 1722  BNP 68.0   Cardiac Enzymes: No results for input(s): CKTOTAL, CKMB, CKMBINDEX, TROPONINI in the last 168 hours. CBG: Recent Labs  Lab 04/05/21 0749 04/05/21 1159 04/05/21 1721 04/05/21 2025 04/06/21 0742  GLUCAP 158* 144* 119* 146* 157*   Hgb A1c No results for input(s): HGBA1C in the last 72 hours. Lipid Profile No results for input(s): CHOL, HDL, LDLCALC, TRIG, CHOLHDL, LDLDIRECT in the last 72 hours. Thyroid function studies No results for input(s): TSH, T4TOTAL, T3FREE, THYROIDAB in the last 72 hours.  Invalid input(s): FREET3 Anemia work up No results for input(s): VITAMINB12, FOLATE, FERRITIN, TIBC, IRON, RETICCTPCT in the last 72 hours. Urinalysis    Component Value Date/Time   COLORURINE YELLOW 10/20/2020  1218   APPEARANCEUR CLEAR 10/20/2020 1218   LABSPEC 1.010 10/20/2020 1218   PHURINE 5.5 10/20/2020 [place] 10/20/2020 [place] (A) 10/20/2020 [place] 10/20/2020 [place] 10/20/2020 1218   PROTEINUR NEGATIVE 10/20/2020 1218   UROBILINOGEN 1.0 11/27/2013 2214   NITRITE NEGATIVE 10/20/2020 1218   LEUKOCYTESUR TRACE (A) 10/20/2020 1218         Time coordinating discharge: Over 45 minutes  SIGNED: Deatra James, MD, FACP, FHM. [facility],  Please use [email or web address] to Page If 7PM-7AM, please contact night-coverage [email or web address] 04/06/2021, 10:36 AM

## 2021-04-06 NOTE — NC FL2 (Signed)
[place] LEVEL OF CARE SCREENING TOOL     IDENTIFICATION  Patient Name: Manuel Le Birthdate: May 14, 1927 Sex: male Admission Date (Current Location): 03/26/2021  [facility] and [place] Number:  [employer] and Address:  [place] [address], [place]      Provider Number: [id number]  Attending Physician Name and Address:  Deatra James, MD  Relative Name and Phone Number:  Delford Field (Other)   [phone]    Current Level of Care: Hospital Recommended Level of Care:   Prior Approval Number:    Date Approved/Denied:   PASRR Number: [id number]  Discharge Plan: SNF    Current Diagnoses: Patient Active Problem List   Diagnosis Date Noted   Acute respiratory disease due to COVID-19 virus 03/26/2021   Acute on chronic respiratory failure with hypoxia ([place]) 03/26/2021   Pneumonia due to COVID-19 virus 03/18/2021   Acute respiratory failure due to COVID-19 ([place]) 03/18/2021   Chronic diastolic CHF (congestive heart failure) ([place]) 03/18/2021   Generalized weakness 03/18/2021   Truncal ataxia 10/20/2020   Dizzy 10/20/2020   TIA (transient ischemic attack) 10/20/2020   Acute respiratory failure with hypoxia ([place])    Influenza with pneumonia    Palliative care encounter    Sepsis ([place]) 09/25/2017   Fever 09/25/2017   UTI (urinary tract infection) 09/25/2017   Abnormal liver function 09/25/2017   Tachycardia 09/25/2017   Influenza A    Pressure ulcer 09/14/2015   Zenker's diverticulum 09/09/2015   Bladder cancer ([place]) 07/14/2015   CAD (coronary artery disease) 07/24/2014   S/P coronary artery stent placement (RCA bare metal stent on 07/08/14) 07/24/2014   Exertional angina ([place]) 07/08/2014   Encounter for therapeutic drug monitoring 09/12/2013   Atrial fibrillation ([place]) 09/08/2013   HTN (hypertension) 09/08/2013   Fatigue 09/08/2013   Anemia 06/26/2012    Orientation RESPIRATION BLADDER Height &  Weight     Self   (5L) Incontinent Weight: 216 lb 11.4 oz (98.3 kg) Height:  6' (182.9 cm)  BEHAVIORAL SYMPTOMS/MOOD NEUROLOGICAL BOWEL NUTRITION STATUS      Incontinent Diet (regular)  AMBULATORY STATUS COMMUNICATION OF NEEDS Skin   Extensive Assist Verbally Normal                       Personal Care Assistance Level of Assistance  Bathing, Feeding, Dressing Bathing Assistance: Maximum assistance Feeding assistance: Independent Dressing Assistance: Maximum assistance     Functional Limitations Info  Sight, Hearing, Speech Sight Info: Adequate Hearing Info: Adequate Speech Info: Adequate    SPECIAL CARE FACTORS FREQUENCY  PT (By licensed PT), Speech therapy     PT Frequency: 5x/week       Speech Therapy Frequency: 3x/week      Contractures Contractures Info: Not present    Additional Factors Info  Code Status, Allergies Code Status Info: DNR Allergies Info: NKA           Current Medications (04/06/2021):  This is the current hospital active medication list Current Facility-Administered Medications  Medication Dose Route Frequency Provider Last Rate Last Admin   0.9 %  sodium chloride infusion  250 mL Intravenous PRN Emokpae, Courage, MD       acetaminophen (TYLENOL) tablet 650 mg  650 mg Oral Q6H PRN Emokpae, Courage, MD       Or   acetaminophen (TYLENOL) suppository 650 mg  650 mg Rectal Q6H PRN Roxan Hockey, MD  albuterol (VENTOLIN HFA) 108 (90 Base) MCG/ACT inhaler 2 puff  2 puff Inhalation Q4H PRN [place] Brick, Courage, MD   2 puff at 04/04/21 1427   ascorbic acid (VITAMIN C) tablet 500 mg  500 mg Oral Daily Emokpae, Courage, MD   500 mg at 04/06/21 [id number]   aspirin chewable tablet 81 mg  81 mg Oral Daily Emokpae, Courage, MD   81 mg at 04/06/21 0902   bisacodyl (DULCOLAX) suppository 10 mg  10 mg Rectal Daily PRN Emokpae, Courage, MD       carvedilol (COREG) tablet 12.5 mg  12.5 mg Oral BID Emokpae, Courage, MD   12.5 mg at 04/06/21 0902    enoxaparin (LOVENOX) injection 40 mg  40 mg Subcutaneous Q24H Emokpae, Courage, MD   40 mg at 04/05/21 2253   ferrous sulfate tablet 325 mg  325 mg Oral Q breakfast Emokpae, Courage, MD   325 mg at 04/06/21 0902   finasteride (PROSCAR) tablet 5 mg  5 mg Oral Daily Emokpae, Courage, MD   5 mg at 04/06/21 0902   furosemide (LASIX) tablet 20 mg  20 mg Oral Once per day on Sun Tue Thu Sat Roxan Hockey, MD   20 mg at 04/05/21 0854   guaiFENesin (MUCINEX) 12 hr tablet 600 mg  600 mg Oral BID Emokpae, Courage, MD   600 mg at 04/06/21 0902   guaiFENesin-dextromethorphan (ROBITUSSIN DM) 100-10 MG/5ML syrup 10 mL  10 mL Oral Q4H PRN Emokpae, Courage, MD   10 mL at 04/04/21 1430   hydrALAZINE (APRESOLINE) injection 10 mg  10 mg Intravenous Q6H PRN Emokpae, Courage, MD   10 mg at 03/30/21 1423   insulin aspart (novoLOG) injection 0-5 Units  0-5 Units Subcutaneous QHS Emokpae, Courage, MD   2 Units at 04/03/21 2319   insulin aspart (novoLOG) injection 0-6 Units  0-6 Units Subcutaneous TID WC Emokpae, Courage, MD   1 Units at 04/06/21 0902   insulin detemir (LEVEMIR) injection 5 Units  5 Units Subcutaneous QHS Barton Dubois, MD   5 Units at 04/05/21 2253   losartan (COZAAR) tablet 100 mg  100 mg Oral Daily Emokpae, Courage, MD   100 mg at 04/06/21 0902   MEDLINE mouth rinse  15 mL Mouth Rinse BID Emokpae, Courage, MD   15 mL at 04/06/21 0903   methylPREDNISolone sodium succinate (SOLU-MEDROL) 125 mg/2 mL injection 60 mg  60 mg Intravenous Q8H Barton Dubois, MD   60 mg at 04/06/21 0901   nitroGLYCERIN (NITROSTAT) SL tablet 0.4 mg  0.4 mg Sublingual Q5 min PRN Emokpae, Courage, MD       ondansetron (ZOFRAN) tablet 4 mg  4 mg Oral Q6H PRN Emokpae, Courage, MD       Or   ondansetron (ZOFRAN) injection 4 mg  4 mg Intravenous Q6H PRN Emokpae, Courage, MD       pantoprazole (PROTONIX) EC tablet 40 mg  40 mg Oral Daily Emokpae, Courage, MD   40 mg at 04/06/21 0902   polyethylene glycol (MIRALAX / GLYCOLAX) packet  17 g  17 g Oral Daily PRN Emokpae, Courage, MD       sertraline (ZOLOFT) tablet 50 mg  50 mg Oral Daily Emokpae, Courage, MD   50 mg at 04/06/21 0902   sodium chloride flush (NS) 0.9 % injection 3 mL  3 mL Intravenous Q12H Emokpae, Courage, MD   3 mL at 04/06/21 0903   sodium chloride flush (NS) 0.9 % injection 3 mL  3 mL Intravenous Q12H  Roxan Hockey, MD   3 mL at 04/06/21 0903   sodium chloride flush (NS) 0.9 % injection 3 mL  3 mL Intravenous PRN Roxan Hockey, MD   3 mL at 04/04/21 0954   traZODone (DESYREL) tablet 50 mg  50 mg Oral QHS PRN Emokpae, Courage, MD   50 mg at 04/01/21 2250   vitamin B-12 (CYANOCOBALAMIN) tablet 1,000 mcg  1,000 mcg Per NG tube Daily Emokpae, Courage, MD   1,000 mcg at 04/06/21 0902   zinc sulfate capsule 220 mg  220 mg Oral Daily Emokpae, Courage, MD   220 mg at 04/06/21 0902     Discharge Medications: Please see discharge summary for a list of discharge medications.  Relevant Imaging Results:  Relevant Lab Results:   Additional Information SSN [id number]. Patient COVID+ on 03/18/21.  Auriana Scalia, Clydene Pugh, LCSW

## 2021-04-06 NOTE — TOC Progression Note (Signed)
Transition of Care [facility]) - Progression Note    Patient Details  Name: Manuel Le MRN: [id number] Date of Birth: 17-Jul-1927  Transition of Care [facility]) CM/SW Contact  Ihor Gully, LCSW Phone Number: 04/06/2021, 4:08 PM  Clinical Narrative:    Marlowe Kays with [employer] requests updated PT notes. PT notified and will see patient on 04/07/21.      Barriers to Discharge: Continued Medical Work up  Expected Discharge Plan and Services           Expected Discharge Date: 04/06/21                                     Social Determinants of Health (SDOH) Interventions    Readmission Risk Interventions Readmission Risk Prevention Plan 03/21/2021  Transportation Screening Complete  PCP or Specialist Appt within 3-5 Days Complete  HRI or [place] Complete  Social Work Consult for [place] Planning/Counseling Complete  Palliative Care Screening Not Applicable  Medication Review [employer]) Complete  Some recent data might be hidden

## 2021-04-07 DIAGNOSIS — I959 Hypotension, unspecified: Secondary | ICD-10-CM | POA: Diagnosis not present

## 2021-04-07 DIAGNOSIS — G4733 Obstructive sleep apnea (adult) (pediatric): Secondary | ICD-10-CM | POA: Diagnosis not present

## 2021-04-07 DIAGNOSIS — J189 Pneumonia, unspecified organism: Secondary | ICD-10-CM | POA: Diagnosis not present

## 2021-04-07 DIAGNOSIS — J1282 Pneumonia due to coronavirus disease 2019: Secondary | ICD-10-CM | POA: Diagnosis not present

## 2021-04-07 DIAGNOSIS — I2510 Atherosclerotic heart disease of native coronary artery without angina pectoris: Secondary | ICD-10-CM | POA: Diagnosis not present

## 2021-04-07 DIAGNOSIS — Z955 Presence of coronary angioplasty implant and graft: Secondary | ICD-10-CM | POA: Diagnosis not present

## 2021-04-07 DIAGNOSIS — R0603 Acute respiratory distress: Secondary | ICD-10-CM | POA: Diagnosis not present

## 2021-04-07 DIAGNOSIS — U099 Post covid-19 condition, unspecified: Secondary | ICD-10-CM | POA: Diagnosis not present

## 2021-04-07 DIAGNOSIS — I5032 Chronic diastolic (congestive) heart failure: Secondary | ICD-10-CM | POA: Diagnosis not present

## 2021-04-07 DIAGNOSIS — R0602 Shortness of breath: Secondary | ICD-10-CM | POA: Diagnosis not present

## 2021-04-07 DIAGNOSIS — Z66 Do not resuscitate: Secondary | ICD-10-CM | POA: Diagnosis not present

## 2021-04-07 DIAGNOSIS — J1289 Other viral pneumonia: Secondary | ICD-10-CM | POA: Diagnosis not present

## 2021-04-07 DIAGNOSIS — D696 Thrombocytopenia, unspecified: Secondary | ICD-10-CM | POA: Diagnosis not present

## 2021-04-07 DIAGNOSIS — U071 COVID-19: Secondary | ICD-10-CM | POA: Diagnosis not present

## 2021-04-07 DIAGNOSIS — Z8551 Personal history of malignant neoplasm of bladder: Secondary | ICD-10-CM | POA: Diagnosis not present

## 2021-04-07 DIAGNOSIS — Z7189 Other specified counseling: Secondary | ICD-10-CM | POA: Diagnosis not present

## 2021-04-07 DIAGNOSIS — R6520 Severe sepsis without septic shock: Secondary | ICD-10-CM | POA: Diagnosis not present

## 2021-04-07 DIAGNOSIS — I110 Hypertensive heart disease with heart failure: Secondary | ICD-10-CM | POA: Diagnosis not present

## 2021-04-07 DIAGNOSIS — J069 Acute upper respiratory infection, unspecified: Secondary | ICD-10-CM | POA: Diagnosis not present

## 2021-04-07 DIAGNOSIS — D509 Iron deficiency anemia, unspecified: Secondary | ICD-10-CM | POA: Diagnosis not present

## 2021-04-07 DIAGNOSIS — R509 Fever, unspecified: Secondary | ICD-10-CM | POA: Diagnosis not present

## 2021-04-07 DIAGNOSIS — I480 Paroxysmal atrial fibrillation: Secondary | ICD-10-CM | POA: Diagnosis not present

## 2021-04-07 DIAGNOSIS — Z7401 Bed confinement status: Secondary | ICD-10-CM | POA: Diagnosis not present

## 2021-04-07 DIAGNOSIS — A419 Sepsis, unspecified organism: Secondary | ICD-10-CM | POA: Diagnosis not present

## 2021-04-07 DIAGNOSIS — J9601 Acute respiratory failure with hypoxia: Secondary | ICD-10-CM | POA: Diagnosis not present

## 2021-04-07 DIAGNOSIS — I4892 Unspecified atrial flutter: Secondary | ICD-10-CM | POA: Diagnosis not present

## 2021-04-07 DIAGNOSIS — R0689 Other abnormalities of breathing: Secondary | ICD-10-CM | POA: Diagnosis not present

## 2021-04-07 DIAGNOSIS — Z515 Encounter for palliative care: Secondary | ICD-10-CM | POA: Diagnosis not present

## 2021-04-07 DIAGNOSIS — R059 Cough, unspecified: Secondary | ICD-10-CM | POA: Diagnosis not present

## 2021-04-07 DIAGNOSIS — Z87891 Personal history of nicotine dependence: Secondary | ICD-10-CM | POA: Diagnosis not present

## 2021-04-07 DIAGNOSIS — Z87442 Personal history of urinary calculi: Secondary | ICD-10-CM | POA: Diagnosis not present

## 2021-04-07 DIAGNOSIS — D72829 Elevated white blood cell count, unspecified: Secondary | ICD-10-CM | POA: Diagnosis not present

## 2021-04-07 DIAGNOSIS — I248 Other forms of acute ischemic heart disease: Secondary | ICD-10-CM | POA: Diagnosis not present

## 2021-04-07 DIAGNOSIS — R0902 Hypoxemia: Secondary | ICD-10-CM | POA: Diagnosis not present

## 2021-04-07 DIAGNOSIS — E872 Acidosis: Secondary | ICD-10-CM | POA: Diagnosis not present

## 2021-04-07 DIAGNOSIS — R1310 Dysphagia, unspecified: Secondary | ICD-10-CM | POA: Diagnosis not present

## 2021-04-07 DIAGNOSIS — Z85828 Personal history of other malignant neoplasm of skin: Secondary | ICD-10-CM | POA: Diagnosis not present

## 2021-04-07 DIAGNOSIS — N400 Enlarged prostate without lower urinary tract symptoms: Secondary | ICD-10-CM | POA: Diagnosis not present

## 2021-04-07 DIAGNOSIS — R778 Other specified abnormalities of plasma proteins: Secondary | ICD-10-CM | POA: Diagnosis not present

## 2021-04-07 DIAGNOSIS — J9621 Acute and chronic respiratory failure with hypoxia: Secondary | ICD-10-CM | POA: Diagnosis not present

## 2021-04-07 LAB — GLUCOSE, CAPILLARY
Glucose-Capillary: 146 mg/dL — ABNORMAL HIGH (ref 70–99)
Glucose-Capillary: 210 mg/dL — ABNORMAL HIGH (ref 70–99)
Glucose-Capillary: 256 mg/dL — ABNORMAL HIGH (ref 70–99)

## 2021-04-07 NOTE — Care Management Important Message (Addendum)
Important Message  Patient Details  Name: Manuel Le MRN: [id number] Date of Birth: 1927-07-03   Medicare Important Message Given:  Yes - Important Message mailed due to current National Emergency  No additional copy needed   Tommy Medal 04/07/2021, 11:22 AM

## 2021-04-07 NOTE — Progress Notes (Signed)
SLP Cancellation Note  Patient Details Name: Manuel Le MRN: [id number] DOB: 1927/01/31   Cancelled treatment:       Reason Eval/Treat Not Completed: Patient's level of consciousness; Pt initially responded verbally to SLP, however did not open eyes and was sleeping soundly so po trials not given. Pt likely to D/C to [facility] today.  Thank you,  Genene Churn, [place]    [place] 04/07/2021, 4:43 PM

## 2021-04-07 NOTE — TOC Transition Note (Signed)
Transition of Care [facility]) - CM/SW Discharge Note   Patient Details  Name: Manuel Le MRN: [id number] Date of Birth: 07/02/1927  Transition of Care [facility]) CM/SW Contact:  Ihor Gully, LCSW Phone Number: 04/07/2021, 1:53 PM   Clinical Narrative:    Discharge clinicals sent. TOC signing off.      Barriers to Discharge: Continued Medical Work up   Patient Goals and CMS Choice        Discharge Placement              Patient chooses bed at: Other - please specify in the comment section below: ([facility]) Patient to be transferred to facility by: [place] Name of family member notified: POA, [name] Patient and family notified of of transfer: 04/07/21  Discharge Plan and Services                                     Social Determinants of Health (SDOH) Interventions     Readmission Risk Interventions Readmission Risk Prevention Plan 03/21/2021  Transportation Screening Complete  PCP or Specialist Appt within 3-5 Days Complete  HRI or Home Care Consult Complete  Social Work Consult for [place] Planning/Counseling Complete  Palliative Care Screening Not Applicable  Medication Review [employer]) Complete  Some recent data might be hidden

## 2021-04-07 NOTE — Progress Notes (Signed)
Physical Therapy Treatment Patient Details Name: Manuel Le MRN: [id number] DOB: 07/08/1927 Today's Date: 04/07/2021    History of Present Illness Manuel Le  is a 87 y.o. male reformed smoker with past medical history relevant for chronic A. fib/CAD, HTN, BPH --Patient was recently admitted to the hospital from 03/18/2021 through 03/22/2021 for COVID-pneumonia -was treated with remdesivir and steroids--- was discharged home with home health after patient and family refused SNF rehab    PT Comments    Patient demonstrates slow labored movement for sitting up at bedside, tolerated sitting up at bedside for approximately 10-12 minutes before having to lean on right elbow due to fatigue and unable to come to complete standing even after multiple attempts using RW due to BLE weakness.  Patient demonstrates fair/good return for using BUE/LE for pulling/pushing self during repositioning when put back to bed with bed in head down position.  Patient will benefit from continued physical therapy in hospital and recommended venue below to increase strength, balance, endurance for safe ADLs and gait.     Follow Up Recommendations  SNF;Supervision/Assistance - 24 hour     Equipment Recommendations  None recommended by PT    Recommendations for Other Services       Precautions / Restrictions Precautions Precautions: Fall Restrictions Weight Bearing Restrictions: No    Mobility  Bed Mobility Overal bed mobility: Needs Assistance Bed Mobility: Supine to Sit;Sit to Supine     Supine to sit: Mod assist;Max assist Sit to supine: Mod assist   General bed mobility comments: slow labored movement    Transfers Overall transfer level: Needs assistance Equipment used: Rolling walker (2 wheeled) Transfers: Sit to/from Stand Sit to Stand: Max assist         General transfer comment: Patient very weak and limited to lifting bottom off bed before having to sit  Ambulation/Gait                  Stairs             Wheelchair Mobility    Modified Rankin (Stroke Patients Only)       Balance Overall balance assessment: Needs assistance Sitting-balance support: Feet supported;No upper extremity supported Sitting balance-Leahy Scale: Fair Sitting balance - Comments: seated at EOB, once fatigued leans to the right Postural control: Right lateral lean Standing balance support: During functional activity;Bilateral upper extremity supported Standing balance-Leahy Scale: Poor Standing balance comment: using RW                            Cognition Arousal/Alertness: Awake/alert Behavior During Therapy: Flat affect Overall Cognitive Status: No family/caregiver present to determine baseline cognitive functioning                                        Exercises General Exercises - Lower Extremity Ankle Circles/Pumps: Strengthening;AAROM;10 reps;Both;Supine Heel Slides: Supine;10 reps;Both;Strengthening;AAROM Hip ABduction/ADduction: AAROM;Strengthening;Both;10 reps;Supine    General Comments        Pertinent Vitals/Pain Pain Assessment: No/denies pain    Home Living                      Prior Function            PT Goals (current goals can now be found in the care plan section) Acute Rehab PT Goals Patient Stated Goal: return home  with family to assist PT Goal Formulation: With patient Time For Goal Achievement: 04/11/21 Potential to Achieve Goals: Fair Progress towards PT goals: Progressing toward goals    Frequency    Min 3X/week      PT Plan Current plan remains appropriate    Co-evaluation              AM-PAC PT "6 Clicks" Mobility   Outcome Measure  Help needed turning from your back to your side while in a flat bed without using bedrails?: A Lot Help needed moving from lying on your back to sitting on the side of a flat bed without using bedrails?: A Lot Help needed moving to and from a  bed to a chair (including a wheelchair)?: Total Help needed standing up from a chair using your arms (e.g., wheelchair or bedside chair)?: A Lot Help needed to walk in hospital room?: Total Help needed climbing 3-5 steps with a railing? : Total 6 Click Score: 9    End of Session Equipment Utilized During Treatment: Oxygen Activity Tolerance: Patient tolerated treatment well;Patient limited by fatigue Patient left: in bed;with call bell/phone within reach Nurse Communication: Mobility status PT Visit Diagnosis: Unsteadiness on feet (R26.81);Other abnormalities of gait and mobility (R26.89);Muscle weakness (generalized) (M62.81)     Time: [id number] PT Time Calculation (min) (ACUTE ONLY): 24 min  Charges:  $Therapeutic Exercise: 8-22 mins $Therapeutic Activity: 8-22 mins                     11:09 AM, 04/07/21 [name], MPT Physical Therapist with [facility] [phone] office [phone] mobile phone

## 2021-04-07 NOTE — Discharge Summary (Signed)
Physician Discharge Summary [facility]    Patient: Manuel Le                   Admit date: 03/26/2021   DOB: 1927-05-05             Discharge date:04/07/2021/12:12 PM [id number]                          PCP: Celene Squibb, MD  Disposition: SNF    Recommendations for Outpatient Follow-up:   Follow up: Follow-up with PCP within next 2 to 3 weeks,  recommending palliative care consultation and SNF, if patient progressive decline hospice consultation recommended Currently satting 98% on 5 L oxygen, O2 may be reduced goal to maintain O2 sat 88-92%  Discharge Condition: Stable   Code Status:   Code Status: DNR  Diet recommendation: Diabetic diet  Patient was seen and examined, no changes, remained stable on 5 L via nasal cannula, satting > 90%  Discharge Diagnoses:    Principal Problem:   Acute respiratory disease due to COVID-19 virus Active Problems:   Atrial fibrillation ([place])   HTN (hypertension)   S/P coronary artery stent placement (RCA bare metal stent on 07/08/14)   Acute on chronic respiratory failure with hypoxia ([place])   History of Present Illness/ Hospital Course [name] Summary:   Manuel Le  is a 87 y.o. male reformed smoker with past medical history relevant for chronic A. fib/CAD, HTN, BPH --Patient was recently admitted to the hospital from 03/18/2021 through 03/22/2021 for COVID-pneumonia -was treated with remdesivir and steroids--- was discharged home with home health after patient and family refused SNF rehab --Patient is unvaccinated -Patient was readmitted on 03/26/2021 with worsening hypoxia  on 5 L of oxygen via nasal cannula Back to baseline now on this admission was requiring as high as 10-12 L of oxygen -- On admission CTA chest on 03/26/2021 with extensive worsening COVID PNA,  No acute pulmonary embolism    1-Acute respiratory disease due to COVID-19 virus -Patient's O2 demand has improved from 10/12 L now on 5 L of oxygen satting  98% The goal will be to further reduce O2 supplements with a goal O2 sat 88-92% -Given images abnormalities at time of admission, increased inflammatory markers and concerns of worsening COVID infection patient will received treatment with Actemra on 03/27/21 -Has been on IV steroids Solu-Medrol will be switched to p.o. Medrol Dosepak taper -Prognosis is guarded,  -Patient is non-vaccinated. -continue oxygen supplementation and continue the use of flutter valve and IS. -Persistent leukocytosis is due to steroids-no signs of overt infection or sepsis   2-Atrial fibrillation (HCC) -Rate controlled, c/n Coreg -Chronically on aspirin   3-chronic diastolic heart failure -Stable and compensated -Continue Coreg and losartan -Last ejection fraction 50 to 55% from echo 2019. Hypoxia and Dyspnea is mostly due to # 1   4-Hyperglycemia -No prior history of diabetes--recent A1c 5.9 -Probably associated to the use of steroids -Continue sliding scale insulin and follow CBGs.,  Continue Levemir   5-GI prophylaxis/GERD -Continue PPI especially while on steroids   6-Social/ethics --- -discussed with patient/family (HCPOA---[name])--- -DNR/DNI -Continue full scope of treatment -Palliative care was consulted -Overall prognosis is not encouraging given persistent and prolonged requirement of oxygen, respiratory failure, comorbidities   7-HTN--Improved BP control --- c/n Coreg to 12.5 mg twice daily  8) new onset thrombocytopenia--- monitor closely  -Patient is high risk for DVT due to bedbound  status and hypercoagulable state induced by COVID infection Recommending continue SCDs   9)Dispo-  SNF - Oxygen requirement 5 L now    Code Status: DNR/DNI Family Communication: Next of skin [name] has been updated Disposition:     Dispo: The patient is from: Home              Anticipated d/c is to: -- HCPOA will prefer SNF over hospice    Discharge Instructions:   Discharge  Instructions     Activity as tolerated - No restrictions   Complete by: As directed    Call MD for:  persistant nausea and vomiting   Complete by: As directed    Call MD for:  temperature >100.4   Complete by: As directed    Compression stockings   Complete by: As directed    Diet - low sodium heart healthy   Complete by: As directed    Discharge instructions   Complete by: As directed    Continue O2 supplements, currently on 5 L of oxygen satting 98%, goal to keep the patient greater 92% may taper down O2 via nasal cannula... Keep O2 sat 88-92% at all times. Recommending follow-up palliative care consultation at SNF facility, effusion progressive decline recommending hospice   Increase activity slowly   Complete by: As directed         Medication List     STOP taking these medications    losartan 100 MG tablet Commonly known as: COZAAR   predniSONE 20 MG tablet Commonly known as: DELTASONE   vitamin B-12 1000 MCG tablet Commonly known as: CYANOCOBALAMIN       TAKE these medications    albuterol 108 (90 Base) MCG/ACT inhaler Commonly known as: VENTOLIN HFA Inhale 2 puffs into the lungs every 4 (four) hours as needed for shortness of breath or wheezing.   ascorbic acid 500 MG tablet Commonly known as: VITAMIN C Take 500 mg by mouth daily.   aspirin 81 MG tablet Take 1 tablet (81 mg total) by mouth daily.   carvedilol 3.125 MG tablet Commonly known as: COREG Take 3.125 mg by mouth 2 (two) times daily.   Centrum Silver Adult 50+ Tabs Take 1 tablet by mouth every morning.   ferrous sulfate 325 (65 FE) MG EC tablet Take 325 mg by mouth daily with breakfast.   finasteride 5 MG tablet Commonly known as: PROSCAR Take 5 mg by mouth daily.   Fish Oil 1000 MG Caps Take 1 capsule by mouth daily.   furosemide 20 MG tablet Commonly known as: LASIX Take 20 mg by mouth See admin instructions.   guaiFENesin-dextromethorphan 100-10 MG/5ML syrup Commonly known  as: ROBITUSSIN DM Take 10 mLs by mouth every 8 (eight) hours for 5 days. What changed:  when to take this reasons to take this   insulin aspart 100 UNIT/ML injection Commonly known as: novoLOG Inject 0-5 Units into the skin at bedtime.   insulin detemir 100 UNIT/ML injection Commonly known as: LEVEMIR Inject 0.05 mLs (5 Units total) into the skin at bedtime.   melatonin 5 MG Tabs Take 5 mg by mouth at bedtime. Take with 3 mg What changed: Another medication with the same name was removed. Continue taking this medication, and follow the directions you see here.   methylPREDNISolone 4 MG Tbpk tablet Commonly known as: MEDROL DOSEPAK Medrol Dosepak take as instructed   nitroGLYCERIN 0.4 MG SL tablet Commonly known as: NITROSTAT Place 1 tablet (0.4 mg total) under the tongue  every 5 (five) minutes as needed for chest pain.   Osteo Bi-Flex Adv Joint Shield Tabs Take 1 tablet by mouth daily.   potassium chloride 10 MEQ tablet Commonly known as: KLOR-CON Take 10 mEq by mouth See admin instructions.   sertraline 50 MG tablet Commonly known as: ZOLOFT Take 50 mg by mouth daily.   Vitamin D3 25 MCG (1000 UT) Caps Take 1 capsule by mouth daily.        Contact information for after-discharge care     [facility] .   Service: Skilled Nursing Contact information: [place]                    No Known Allergies   Procedures /Studies:   CT Angio Chest PE W and/or Wo Contrast  Result Date: 03/26/2021 CLINICAL DATA:  PE suspected, recent COVID, home oxygen EXAM: CT ANGIOGRAPHY CHEST WITH CONTRAST TECHNIQUE: Multidetector CT imaging of the chest was performed using the standard protocol during bolus administration of intravenous contrast. Multiplanar CT image reconstructions and MIPs were obtained to evaluate the vascular anatomy. CONTRAST:  129m OMNIPAQUE IOHEXOL  350 MG/ML SOLN COMPARISON:  03/18/2021 FINDINGS: Cardiovascular: Satisfactory opacification of the pulmonary arteries to the segmental level. No evidence of pulmonary embolism. Cardiomegaly. Three-vessel coronary artery calcifications and/or stents. No pericardial effusion. Enlargement of the main pulmonary artery measuring up to 3.6 cm. Aortic atherosclerosis. Mediastinum/Nodes: No enlarged mediastinal, hilar, or axillary lymph nodes. Thyroid gland, trachea, and esophagus demonstrate no significant findings. Lungs/Pleura: Extensive peripheral ground-glass airspace opacity present bilaterally, predominantly in the dependent lungs, slightly worsened compared to prior examination. There is likely some degree of underlying scarring of the bilateral lung bases. Upper Abdomen: No acute abnormality. Musculoskeletal: No chest wall abnormality. No acute or significant osseous findings. Review of the MIP images confirms the above findings. IMPRESSION: 1. Negative examination for pulmonary embolism. 2. Extensive peripheral ground-glass airspace opacity present bilaterally, predominantly in the dependent lungs, slightly worsened compared to prior examination and in keeping with COVID-19 airspace disease. 3. Cardiomegaly and coronary artery disease. 4. Enlargement of the main pulmonary artery, as can be seen in pulmonary hypertension. Aortic Atherosclerosis (ICD10-I70.0). Electronically Signed   By: AEddie CandleM.D.   On: 03/26/2021 20:05   CT Angio Chest PE W and/or Wo Contrast  Result Date: 03/18/2021 CLINICAL DATA:  87year old male with shortness of breath, COVID positive. EXAM: CT ANGIOGRAPHY CHEST WITH CONTRAST TECHNIQUE: Multidetector CT imaging of the chest was performed using the standard protocol during bolus administration of intravenous contrast. Multiplanar CT image reconstructions and MIPs were obtained to evaluate the vascular anatomy. CONTRAST:  100 mL Omnipaque 350, intravenous COMPARISON:  09/25/2017  FINDINGS: Cardiovascular: Satisfactory opacification of the pulmonary arteries to the segmental level. No evidence of pulmonary embolism. Normal heart size. No pericardial effusion. Coronary, aortic valvular, mitral annular, and aortic atherosclerotic calcifications are noted. Mediastinum/Nodes: No enlarged mediastinal, hilar, or axillary lymph nodes. Thyroid gland, trachea, and esophagus demonstrate no significant findings. Lungs/Pleura: Similar appearing bibasilar peribronchovascular thickening with associated dependent atelectasis and scattered consolidative opacities. No new consolidative opacities, significant pleural effusion, pneumothorax. Mild emphysematous changes are noted. Mild Upper Abdomen: The visualized upper abdomen is within normal limits. Musculoskeletal: No chest wall abnormality. No acute or significant osseous findings. Review of the MIP images confirms the above findings. IMPRESSION: Vascular: 1. No evidence of pulmonary embolism. 2. Coronary and aortic atherosclerosis (ICD10-I70.0). Non-Vascular: 1. Bibasilar  bronchial wall thickening and associated atelectasis, similar to 2019 comparison, likely indicative of sequela of chronic aspiration. Superimposed pneumonia could appear similarly. 2.  Mild emphysema (ICD10-J43.9). Ruthann Cancer, MD [facility] Radiology Electronically Signed   By: Ruthann Cancer MD   On: 03/18/2021 13:44   DG CHEST PORT 1 VIEW  Result Date: 04/05/2021 CLINICAL DATA:  Shortness of breath. EXAM: PORTABLE CHEST 1 VIEW COMPARISON:  03/26/2021 FINDINGS: Decreased lung volumes. Persistent bilateral upper and lower lung zone interstitial opacities. When compared with the previous exam there is been worsening aeration to the left upper lobe. IMPRESSION: Persistent bilateral interstitial opacities with worsening aeration to the left upper lobe. Electronically Signed   By: Kerby Moors M.D.   On: 04/05/2021 10:37   DG Chest  Port 1 View  Result Date: 03/26/2021 CLINICAL DATA:  Shortness of breath. EXAM: PORTABLE CHEST 1 VIEW COMPARISON:  03/18/2021. FINDINGS: Low lung volumes with mildly increased diffuse interstitial opacities. No visible pleural effusions or pneumothorax. Right lung apex is partially obscured by overlapping neck/face. Stable cardiomediastinal silhouette. Calcific atherosclerosis of the aorta. No acute osseous abnormality. IMPRESSION: Low lung volumes with mildly increased diffuse interstitial opacities, potentially mild interstitial edema or atypical infection. Dedicated PA and lateral radiographs may be helpful if the patient is able. Electronically Signed   By: Margaretha Sheffield MD   On: 03/26/2021 17:39   DG Chest Port 1 View  Result Date: 03/18/2021 CLINICAL DATA:  87 year old male with shortness of breath for 1 week. Positive COVID-19. EXAM: PORTABLE CHEST 1 VIEW COMPARISON:  Chest radiographs 07/27/2019 and earlier. FINDINGS: Portable AP semi upright view at 1039 hours. Lower lung volumes. Indistinct increased lung base opacity greater on the left. Stable cardiac size and mediastinal contours. No pneumothorax or pulmonary edema. No definite pleural effusion. No other confluent pulmonary opacity. Visualized tracheal air column is within normal limits. Paucity of bowel gas in the upper abdomen. No acute osseous abnormality identified. IMPRESSION: Low lung volumes with nonspecific bibasilar opacity greater on the left, possibly atelectasis but left lung base pneumonia not excluded. PA and lateral views would be helpful when feasible. Electronically Signed   By: Genevie Ann M.D.   On: 03/18/2021 11:02    Subjective:   Patient was seen and examined 04/07/2021, 12:12 PM Patient stable today. No acute distress.  No issues overnight Stable for discharge.  Discharge Exam:    Vitals:   04/05/21 2310 04/06/21 0540 04/06/21 2121 04/07/21 0524  BP:  (!) 142/60 (!) 148/73 (!) 153/72  Pulse:  65 64 64  Resp:  '19  18 19  '$ Temp:  98 F (36.7 C) 98.2 F (36.8 C) 98 F (36.7 C)  TempSrc:      SpO2: 92% 95% 90% 90%  Weight:      Height:       Seen and examined no changes-respiratory efforts General: Pt lying comfortably in bed & appears in no obvious distress.... Pleasantly confused Cardiovascular: S1 & S2 heard, RRR, S1/S2 +. No murmurs, rubs, gallops or clicks. No JVD or pedal edema. Respiratory: Mild shortness of breath, on 5 L of oxygen, satting 98%  clear to auscultation without wheezing, rhonchi or crackles. No increased work of breathing. Abdominal:  Non-distended, non-tender & soft. No organomegaly or masses appreciated. Normal bowel sounds heard. CNS: Alert and oriented. No focal deficits. Extremities: no edema, no cyanosis      The results of significant diagnostics from this hospitalization (including imaging, microbiology, ancillary and laboratory)  are listed below for reference.      Microbiology:   No results found for this or any previous visit (from the past 240 hour(s)).   Labs:   CBC: Recent Labs  Lab 04/01/21 0423 04/03/21 0657 04/05/21 0404 04/06/21 0702  WBC 14.7* 19.9* 20.8* 18.0*  HGB 13.7 16.2 15.3 15.1  HCT 40.5 48.5 46.5 44.9  MCV 92.7 93.4 93.9 94.1  PLT 179 134* 74* 41*   Basic Metabolic Panel: Recent Labs  Lab 04/01/21 0423 04/03/21 0657 04/05/21 0404  NA 138 140 141  K 3.9 4.7 4.4  CL 105 106 107  CO2 '25 26 28  '$ GLUCOSE 162* 194* 164*  BUN 38* 37* 31*  CREATININE 0.73 0.76 0.68  CALCIUM 8.7* 8.7* 8.9   Liver Function Tests: Recent Labs  Lab 04/03/21 0657  AST 30  ALT 41  ALKPHOS 60  BILITOT 1.2  PROT 6.2*  ALBUMIN 3.0*   BNP (last 3 results) Recent Labs    03/26/21 1722  BNP 68.0   Cardiac Enzymes: No results for input(s): CKTOTAL, CKMB, CKMBINDEX, TROPONINI in the last 168 hours. CBG: Recent Labs  Lab 04/06/21 1136 04/06/21 1638 04/06/21 2114 04/07/21 0748 04/07/21 1124  GLUCAP 151* 135* 147* 146* 210*   Hgb  A1c No results for input(s): HGBA1C in the last 72 hours. Lipid Profile No results for input(s): CHOL, HDL, LDLCALC, TRIG, CHOLHDL, LDLDIRECT in the last 72 hours. Thyroid function studies No results for input(s): TSH, T4TOTAL, T3FREE, THYROIDAB in the last 72 hours.  Invalid input(s): FREET3 Anemia work up No results for input(s): VITAMINB12, FOLATE, FERRITIN, TIBC, IRON, RETICCTPCT in the last 72 hours. Urinalysis    Component Value Date/Time   COLORURINE YELLOW 10/20/2020 1218   APPEARANCEUR CLEAR 10/20/2020 1218   LABSPEC 1.010 10/20/2020 1218   PHURINE 5.5 10/20/2020 [place] 10/20/2020 [place] (A) 10/20/2020 [place] 10/20/2020 [place] 10/20/2020 1218   PROTEINUR NEGATIVE 10/20/2020 1218   UROBILINOGEN 1.0 11/27/2013 2214   NITRITE NEGATIVE 10/20/2020 1218   LEUKOCYTESUR TRACE (A) 10/20/2020 1218         Time coordinating discharge: Over 45 minutes  SIGNED: Deatra James, MD, FACP, FHM. [facility],  Please use [email or web address] to Page If 7PM-7AM, please contact night-coverage [email or web address], Password [facility] 04/07/2021, 12:12 PM

## 2021-04-08 ENCOUNTER — Inpatient Hospital Stay (HOSPITAL_COMMUNITY): Payer: PPO

## 2021-04-08 ENCOUNTER — Emergency Department (HOSPITAL_COMMUNITY): Payer: PPO

## 2021-04-08 ENCOUNTER — Inpatient Hospital Stay (HOSPITAL_COMMUNITY)
Admission: EM | Admit: 2021-04-08 | Discharge: 2021-04-14 | DRG: 871 | Disposition: A | Discharge status: Hospice | Payer: PPO | Attending: Family Medicine | Admitting: Family Medicine

## 2021-04-08 ENCOUNTER — Other Ambulatory Visit: Payer: Self-pay

## 2021-04-08 DIAGNOSIS — R0689 Other abnormalities of breathing: Secondary | ICD-10-CM | POA: Diagnosis not present

## 2021-04-08 DIAGNOSIS — R0603 Acute respiratory distress: Secondary | ICD-10-CM

## 2021-04-08 DIAGNOSIS — R509 Fever, unspecified: Secondary | ICD-10-CM | POA: Diagnosis not present

## 2021-04-08 DIAGNOSIS — R778 Other specified abnormalities of plasma proteins: Secondary | ICD-10-CM | POA: Diagnosis present

## 2021-04-08 DIAGNOSIS — Z955 Presence of coronary angioplasty implant and graft: Secondary | ICD-10-CM

## 2021-04-08 DIAGNOSIS — I480 Paroxysmal atrial fibrillation: Secondary | ICD-10-CM | POA: Diagnosis present

## 2021-04-08 DIAGNOSIS — Z8551 Personal history of malignant neoplasm of bladder: Secondary | ICD-10-CM | POA: Diagnosis not present

## 2021-04-08 DIAGNOSIS — Z87891 Personal history of nicotine dependence: Secondary | ICD-10-CM | POA: Diagnosis not present

## 2021-04-08 DIAGNOSIS — J069 Acute upper respiratory infection, unspecified: Secondary | ICD-10-CM | POA: Diagnosis present

## 2021-04-08 DIAGNOSIS — Z66 Do not resuscitate: Secondary | ICD-10-CM | POA: Diagnosis not present

## 2021-04-08 DIAGNOSIS — R0602 Shortness of breath: Secondary | ICD-10-CM

## 2021-04-08 DIAGNOSIS — J189 Pneumonia, unspecified organism: Secondary | ICD-10-CM | POA: Diagnosis not present

## 2021-04-08 DIAGNOSIS — D509 Iron deficiency anemia, unspecified: Secondary | ICD-10-CM | POA: Diagnosis present

## 2021-04-08 DIAGNOSIS — A419 Sepsis, unspecified organism: Secondary | ICD-10-CM | POA: Diagnosis not present

## 2021-04-08 DIAGNOSIS — Z6826 Body mass index (BMI) 26.0-26.9, adult: Secondary | ICD-10-CM

## 2021-04-08 DIAGNOSIS — M1990 Unspecified osteoarthritis, unspecified site: Secondary | ICD-10-CM | POA: Diagnosis present

## 2021-04-08 DIAGNOSIS — U071 COVID-19: Secondary | ICD-10-CM | POA: Diagnosis not present

## 2021-04-08 DIAGNOSIS — I248 Other forms of acute ischemic heart disease: Secondary | ICD-10-CM | POA: Diagnosis not present

## 2021-04-08 DIAGNOSIS — R0902 Hypoxemia: Principal | ICD-10-CM

## 2021-04-08 DIAGNOSIS — N400 Enlarged prostate without lower urinary tract symptoms: Secondary | ICD-10-CM | POA: Diagnosis not present

## 2021-04-08 DIAGNOSIS — Z7189 Other specified counseling: Secondary | ICD-10-CM | POA: Diagnosis not present

## 2021-04-08 DIAGNOSIS — J9621 Acute and chronic respiratory failure with hypoxia: Secondary | ICD-10-CM | POA: Diagnosis present

## 2021-04-08 DIAGNOSIS — I4892 Unspecified atrial flutter: Secondary | ICD-10-CM | POA: Diagnosis not present

## 2021-04-08 DIAGNOSIS — Z96653 Presence of artificial knee joint, bilateral: Secondary | ICD-10-CM | POA: Diagnosis present

## 2021-04-08 DIAGNOSIS — I5032 Chronic diastolic (congestive) heart failure: Secondary | ICD-10-CM | POA: Diagnosis not present

## 2021-04-08 DIAGNOSIS — Z515 Encounter for palliative care: Secondary | ICD-10-CM | POA: Diagnosis not present

## 2021-04-08 DIAGNOSIS — Z7982 Long term (current) use of aspirin: Secondary | ICD-10-CM

## 2021-04-08 DIAGNOSIS — G4733 Obstructive sleep apnea (adult) (pediatric): Secondary | ICD-10-CM | POA: Diagnosis not present

## 2021-04-08 DIAGNOSIS — E872 Acidosis: Secondary | ICD-10-CM

## 2021-04-08 DIAGNOSIS — R6520 Severe sepsis without septic shock: Secondary | ICD-10-CM

## 2021-04-08 DIAGNOSIS — J9601 Acute respiratory failure with hypoxia: Secondary | ICD-10-CM | POA: Diagnosis not present

## 2021-04-08 DIAGNOSIS — I2510 Atherosclerotic heart disease of native coronary artery without angina pectoris: Secondary | ICD-10-CM | POA: Diagnosis not present

## 2021-04-08 DIAGNOSIS — D72829 Elevated white blood cell count, unspecified: Secondary | ICD-10-CM | POA: Diagnosis not present

## 2021-04-08 DIAGNOSIS — J1282 Pneumonia due to coronavirus disease 2019: Secondary | ICD-10-CM | POA: Diagnosis present

## 2021-04-08 DIAGNOSIS — R54 Age-related physical debility: Secondary | ICD-10-CM | POA: Diagnosis present

## 2021-04-08 DIAGNOSIS — Z794 Long term (current) use of insulin: Secondary | ICD-10-CM

## 2021-04-08 DIAGNOSIS — R059 Cough, unspecified: Secondary | ICD-10-CM | POA: Diagnosis not present

## 2021-04-08 DIAGNOSIS — U099 Post covid-19 condition, unspecified: Secondary | ICD-10-CM | POA: Diagnosis present

## 2021-04-08 DIAGNOSIS — D696 Thrombocytopenia, unspecified: Secondary | ICD-10-CM | POA: Diagnosis present

## 2021-04-08 DIAGNOSIS — Z85828 Personal history of other malignant neoplasm of skin: Secondary | ICD-10-CM | POA: Diagnosis not present

## 2021-04-08 DIAGNOSIS — I110 Hypertensive heart disease with heart failure: Secondary | ICD-10-CM | POA: Diagnosis not present

## 2021-04-08 DIAGNOSIS — R1310 Dysphagia, unspecified: Secondary | ICD-10-CM | POA: Diagnosis not present

## 2021-04-08 DIAGNOSIS — Z87442 Personal history of urinary calculi: Secondary | ICD-10-CM | POA: Diagnosis not present

## 2021-04-08 DIAGNOSIS — I959 Hypotension, unspecified: Secondary | ICD-10-CM | POA: Diagnosis not present

## 2021-04-08 DIAGNOSIS — J9600 Acute respiratory failure, unspecified whether with hypoxia or hypercapnia: Secondary | ICD-10-CM | POA: Diagnosis present

## 2021-04-08 LAB — ECHOCARDIOGRAM COMPLETE
AR max vel: 2 cm2
AV Area VTI: 1.64 cm2
AV Area mean vel: 1.59 cm2
AV Mean grad: 6 mmHg
AV Peak grad: 9.2 mmHg
Ao pk vel: 1.52 m/s
Area-P 1/2: 3.63 cm2
S' Lateral: 2.28 cm

## 2021-04-08 LAB — COMPREHENSIVE METABOLIC PANEL
ALT: 36 U/L (ref 0–44)
AST: 25 U/L (ref 15–41)
Albumin: 3.2 g/dL — ABNORMAL LOW (ref 3.5–5.0)
Alkaline Phosphatase: 59 U/L (ref 38–126)
Anion gap: 8 (ref 5–15)
BUN: 41 mg/dL — ABNORMAL HIGH (ref 8–23)
CO2: 26 mmol/L (ref 22–32)
Calcium: 9.2 mg/dL (ref 8.9–10.3)
Chloride: 106 mmol/L (ref 98–111)
Creatinine, Ser: 1.11 mg/dL (ref 0.61–1.24)
GFR, Estimated: >60 mL/min (ref >60)
Glucose, Bld: 140 mg/dL — ABNORMAL HIGH (ref 70–99)
Potassium: 3.8 mmol/L (ref 3.5–5.1)
Sodium: 140 mmol/L (ref 135–145)
Total Bilirubin: 1.6 mg/dL — ABNORMAL HIGH (ref 0.3–1.2)
Total Protein: 5.8 g/dL — ABNORMAL LOW (ref 6.5–8.1)

## 2021-04-08 LAB — BLOOD GAS, ARTERIAL
Acid-base deficit: 2.2 mmol/L — ABNORMAL HIGH (ref 0.0–2.0)
Bicarbonate: 22.7 mmol/L (ref 20.0–28.0)
FIO2: 80
O2 Saturation: 83.4 %
Patient temperature: 36.7
pCO2 arterial: 34 mmHg (ref 32.0–48.0)
pH, Arterial: 7.419 (ref 7.350–7.450)
pO2, Arterial: 54.2 mmHg — ABNORMAL LOW (ref 83.0–108.0)

## 2021-04-08 LAB — PHOSPHORUS: Phosphorus: 4.3 mg/dL (ref 2.5–4.6)

## 2021-04-08 LAB — CBC WITH DIFFERENTIAL/PLATELET
Abs Immature Granulocytes: 0.35 10*3/uL — ABNORMAL HIGH (ref 0.00–0.07)
Basophils Absolute: 0.2 10*3/uL — ABNORMAL HIGH (ref 0.0–0.1)
Basophils Relative: 0 %
Eosinophils Absolute: 0 10*3/uL (ref 0.0–0.5)
Eosinophils Relative: 0 %
HCT: 50.1 % (ref 39.0–52.0)
Hemoglobin: 17.1 g/dL — ABNORMAL HIGH (ref 13.0–17.0)
Immature Granulocytes: 1 %
Lymphocytes Relative: 1 %
Lymphs Abs: 0.6 10*3/uL — ABNORMAL LOW (ref 0.7–4.0)
MCH: 31.7 pg (ref 26.0–34.0)
MCHC: 34.1 g/dL (ref 30.0–36.0)
MCV: 92.9 fL (ref 80.0–100.0)
Monocytes Absolute: 0.9 10*3/uL (ref 0.1–1.0)
Monocytes Relative: 2 %
Neutro Abs: 36.8 10*3/uL — ABNORMAL HIGH (ref 1.7–7.7)
Neutrophils Relative %: 96 %
Platelets: 38 10*3/uL — ABNORMAL LOW (ref 150–400)
RBC: 5.39 MIL/uL (ref 4.22–5.81)
RDW: 13.6 % (ref 11.5–15.5)
WBC: 38.8 10*3/uL — ABNORMAL HIGH (ref 4.0–10.5)
nRBC: 0 % (ref 0.0–0.2)

## 2021-04-08 LAB — URINALYSIS, ROUTINE W REFLEX MICROSCOPIC
Bilirubin Urine: NEGATIVE
Glucose, UA: NEGATIVE mg/dL
Ketones, ur: NEGATIVE mg/dL
Nitrite: NEGATIVE
Protein, ur: 30 mg/dL — AB
Specific Gravity, Urine: 1.039 — ABNORMAL HIGH (ref 1.005–1.030)
WBC, UA: >50 WBC/hpf — ABNORMAL HIGH (ref 0–5)
pH: 5 (ref 5.0–8.0)

## 2021-04-08 LAB — PROTIME-INR
INR: 1.2 (ref 0.8–1.2)
Prothrombin Time: 14.9 seconds (ref 11.4–15.2)

## 2021-04-08 LAB — TROPONIN I (HIGH SENSITIVITY)
Troponin I (High Sensitivity): 154 ng/L (ref <18)
Troponin I (High Sensitivity): 141 ng/L (ref <18)
Troponin I (High Sensitivity): 97 ng/L — ABNORMAL HIGH (ref <18)

## 2021-04-08 LAB — LACTIC ACID, PLASMA
Lactic Acid, Venous: 2.7 mmol/L (ref 0.5–1.9)
Lactic Acid, Venous: 3.6 mmol/L (ref 0.5–1.9)
Lactic Acid, Venous: 3.1 mmol/L (ref 0.5–1.9)

## 2021-04-08 LAB — APTT: aPTT: <20 seconds — ABNORMAL LOW (ref 24–36)

## 2021-04-08 LAB — MAGNESIUM: Magnesium: 2.3 mg/dL (ref 1.7–2.4)

## 2021-04-08 LAB — BRAIN NATRIURETIC PEPTIDE: B Natriuretic Peptide: 475 pg/mL — ABNORMAL HIGH (ref 0.0–100.0)

## 2021-04-08 MED ORDER — VANCOMYCIN HCL IN DEXTROSE 1-5 GM/200ML-% IV SOLN: 1000.0000 mg | Freq: Once | INTRAVENOUS | Status: DC

## 2021-04-08 MED ORDER — SODIUM CHLORIDE 0.9 % IV SOLN
2.0000 g | Freq: Two times a day (BID) | INTRAVENOUS | Status: DC
  Administered 2021-04-08 – 2021-04-11 (×6): 2 g via INTRAVENOUS
  Filled 2021-04-08 (×7): qty 2

## 2021-04-08 MED ORDER — ONDANSETRON HCL 4 MG/2ML IJ SOLN: 4.0000 mg | Freq: Four times a day (QID) | INTRAMUSCULAR | Status: DC | PRN

## 2021-04-08 MED ORDER — ACETAMINOPHEN 650 MG RE SUPP: 650.0000 mg | Freq: Four times a day (QID) | RECTAL | Status: DC | PRN

## 2021-04-08 MED ORDER — IOHEXOL 350 MG/ML SOLN
100.0000 mL | Freq: Once | INTRAVENOUS | Status: AC | PRN
  Administered 2021-04-08: 100 mL via INTRAVENOUS

## 2021-04-08 MED ORDER — METHYLPREDNISOLONE SODIUM SUCC 40 MG IJ SOLR
40.0000 mg | INTRAMUSCULAR | Status: DC
  Administered 2021-04-08 – 2021-04-11 (×4): 40 mg via INTRAVENOUS
  Filled 2021-04-08 (×4): qty 1

## 2021-04-08 MED ORDER — CARVEDILOL 3.125 MG PO TABS
3.1250 mg | ORAL_TABLET | Freq: Two times a day (BID) | ORAL | Status: DC
  Filled 2021-04-08 (×4): qty 1

## 2021-04-08 MED ORDER — ALBUTEROL SULFATE (2.5 MG/3ML) 0.083% IN NEBU
2.5000 mg | INHALATION_SOLUTION | Freq: Once | RESPIRATORY_TRACT | Status: AC
  Administered 2021-04-08: 2.5 mg via RESPIRATORY_TRACT
  Filled 2021-04-08: qty 3

## 2021-04-08 MED ORDER — LACTATED RINGERS IV SOLN: INTRAVENOUS | Status: AC

## 2021-04-08 MED ORDER — IPRATROPIUM-ALBUTEROL 20-100 MCG/ACT IN AERS: 1.0000 | INHALATION_SPRAY | Freq: Four times a day (QID) | RESPIRATORY_TRACT | Status: DC | PRN

## 2021-04-08 MED ORDER — ONDANSETRON HCL 4 MG PO TABS: 4.0000 mg | ORAL_TABLET | Freq: Four times a day (QID) | ORAL | Status: DC | PRN

## 2021-04-08 MED ORDER — CHLORHEXIDINE GLUCONATE CLOTH 2 % EX PADS
6.0000 | MEDICATED_PAD | Freq: Every day | CUTANEOUS | Status: DC
  Administered 2021-04-10: 6 via TOPICAL

## 2021-04-08 MED ORDER — ASPIRIN EC 81 MG PO TBEC
81.0000 mg | DELAYED_RELEASE_TABLET | Freq: Every day | ORAL | Status: DC
  Filled 2021-04-08 (×2): qty 1

## 2021-04-08 MED ORDER — IPRATROPIUM-ALBUTEROL 0.5-2.5 (3) MG/3ML IN SOLN
3.0000 mL | Freq: Once | RESPIRATORY_TRACT | Status: AC
  Administered 2021-04-08: 3 mL via RESPIRATORY_TRACT
  Filled 2021-04-08: qty 3

## 2021-04-08 MED ORDER — PANTOPRAZOLE SODIUM 40 MG IV SOLR
40.0000 mg | INTRAVENOUS | Status: DC
  Administered 2021-04-08 – 2021-04-11 (×4): 40 mg via INTRAVENOUS
  Filled 2021-04-08 (×4): qty 40

## 2021-04-08 MED ORDER — ACETAMINOPHEN 325 MG PO TABS: 650.0000 mg | ORAL_TABLET | Freq: Four times a day (QID) | ORAL | Status: DC | PRN

## 2021-04-08 MED ORDER — VANCOMYCIN HCL 2000 MG/400ML IV SOLN
2000.0000 mg | Freq: Once | INTRAVENOUS | Status: AC
  Administered 2021-04-08: 2000 mg via INTRAVENOUS
  Filled 2021-04-08: qty 400

## 2021-04-08 MED ORDER — PIPERACILLIN-TAZOBACTAM 3.375 G IVPB 30 MIN: 3.3750 g | Freq: Three times a day (TID) | INTRAVENOUS | Status: DC

## 2021-04-08 MED ORDER — VANCOMYCIN HCL 1250 MG/250ML IV SOLN
1250.0000 mg | INTRAVENOUS | Status: DC
  Administered 2021-04-09: 1250 mg via INTRAVENOUS
  Filled 2021-04-08: qty 250

## 2021-04-08 MED ORDER — SODIUM CHLORIDE 0.9 % IV SOLN
2.0000 g | Freq: Once | INTRAVENOUS | Status: AC
  Administered 2021-04-08: 2 g via INTRAVENOUS
  Filled 2021-04-08: qty 2

## 2021-04-08 MED ORDER — FLUTICASONE FUROATE-VILANTEROL 100-25 MCG/INH IN AEPB
1.0000 | INHALATION_SPRAY | Freq: Every day | RESPIRATORY_TRACT | Status: DC
  Administered 2021-04-09 – 2021-04-12 (×4): 1 via RESPIRATORY_TRACT
  Filled 2021-04-08: qty 28

## 2021-04-08 NOTE — ED Triage Notes (Signed)
Pt was sent here because of a spo2 of 74% on 6 lpm o2

## 2021-04-08 NOTE — Sepsis Progress Note (Addendum)
Per secure chat, Dr Dyann Kief is ordering follow up Lactic acid.

## 2021-04-08 NOTE — Progress Notes (Signed)
  Echocardiogram 2D Echocardiogram has been performed.  Manuel Le 04/08/2021, 12:06 PM

## 2021-04-08 NOTE — H&P (Signed)
History and Physical    Manuel Le [id number] DOB: April 02, 1927 DOA: 04/08/2021  PCP: Caprice Renshaw, MD   Patient coming from: [place].  I have personally briefly reviewed patient's old medical records in [place]  Chief Complaint: Worsening shortness of breath and hypoxia.  HPI: Manuel Le is a 87 y.o. male with medical history significant of chronic paroxysmal atrial fibrillation, coronary artery disease, hypertension and recent hospitalizations due to COVID-19 pneumonia; who came to the emergency department from the skilled nursing facility secondary to worsening respiratory status and hypoxia.  Patient was just discharged on 04/07/2021 using 5 L nasal cannula supplementation to nursing home for rehabilitation and further care.  On the day of admission despite using 6 L nasal cannula supplementation patient's saturation was in the mid 70s.  There was no fever appreciated.  Patient was tachypneic and denies chest pain.  No nausea, no vomiting, no hematuria, no dysuria, no melena, no hematochezia, no focal deficits.  As mentioned above recent history of COVID infection; patient is nonvaccinated.  During my evaluation patient is on 15 L high flow nasal cannula supplementation to keep O2 sats above 90%.  ED Course: Chest x-ray/CT angiogram of the chest demonstrating worsening multifocal pneumonia, elevated WBCs, elevated BMP, elevated troponin and elevated WBCs.  Cultures taken, IV broad-spectrum antibiotics initiated.  Gentle bolus of IV fluids provided.  Cardiology service curbside with recommendations to check 2D echo for completeness and cycle cardiac enzymes.  Not a candidate for interventions.  TRH consulted to place patient in the hospital for further evaluation and management.    Review of Systems: As per HPI otherwise all other systems reviewed and are negative.   Past Medical History:  Diagnosis Date   Arthritis    OSTEO   Atrial fibrillation and  flutter ([place])    Bladder cancer ([place])    Coronary artery disease    Dysrhythmia    Exertional angina ([place]) 07/08/2014   H/O urinary frequency    Hematuria    Hypertension    Iron deficiency anemia    Kidney stones    hx of    Mild obstructive sleep apnea    no cpap   S/P coronary artery stent placement 07/24/2014   Skin cancer    Weakness     Past Surgical History:  Procedure Laterality Date   CARDIAC CATHETERIZATION     CARDIOVASCULAR STRESS TEST  12-15-2013  DR Kate Sable ([place])   MILD - MODERATE PERI-INFARCT ISCHEMIA  OF INFERIOR WALL, MID-INFEROSEPTAL WALL, MID-INFEROLATERAL WALL, & BASAL INFERIOR WALL/   NORMAL LVF /  EF 59%/  INTERMEDIATE RISK STUDY   COLONOSCOPY  07/04/2012   Procedure: COLONOSCOPY;  Surgeon: Rogene Houston, MD;  [facility];  Service: Endoscopy;  Laterality: N/A;  320   CORONARY STENT PLACEMENT  07/08/2014   PTCA of RCA   DR COOPER   CYSTOSCOPY W/ RETROGRADES Bilateral 12/31/2013   Procedure: CYSTOSCOPY WITH RETROGRADE PYELOGRAM;  Surgeon: Alexis Frock, MD;  Location: [facility];  Service: Urology;  Laterality: Bilateral;   CYSTOSCOPY W/ RETROGRADES Bilateral 01/20/2015   Procedure: CYSTOSCOPY WITH RETROGRADE PYELOGRAM;  Surgeon: Alexis Frock, MD;  Location: [facility];  Service: Urology;  Laterality: Bilateral;   ESOPHAGOGASTRODUODENOSCOPY  07/04/2012   Procedure: ESOPHAGOGASTRODUODENOSCOPY (EGD);  Surgeon: Rogene Houston, MD;  [facility];  Service: Endoscopy;  Laterality: N/A;   EYE SURGERY     past cataract surgery bilat   HERNIA REPAIR  Right    LEFT HEART CATHETERIZATION WITH CORONARY ANGIOGRAM N/A 07/08/2014   Procedure: LEFT HEART CATHETERIZATION WITH CORONARY ANGIOGRAM;  Surgeon: Blane Ohara, MD;  Location[facility] CATH LAB;  Service: Cardiovascular;  Laterality: N/A;   PERCUTANEOUS CORONARY STENT INTERVENTION (PCI-S)  07/08/2014   Procedure: PERCUTANEOUS CORONARY STENT INTERVENTION (PCI-S);  Surgeon:  Blane Ohara, MD;  Location: [facility] CATH LAB;  Service: Cardiovascular;;   TOTAL KNEE ARTHROPLASTY Bilateral 2000  &  2003?   TRANSTHORACIC ECHOCARDIOGRAM  09-11-2013   MODERATE LVH/  EF 55-60%/  MILD MR & TR/  MILD TO MODERATE CALCIFIED AV WITHOUT STENOSIS/  MILD LAE/   MODERATE PR   TRANSURETHRAL RESECTION OF BLADDER TUMOR N/A 01/20/2015   Procedure: TRANSURETHRAL RESECTION OF BLADDER TUMOR (TURBT);  Surgeon: Alexis Frock, MD;  [facility];  Service: Urology;  Laterality: N/A;   TRANSURETHRAL RESECTION OF BLADDER TUMOR WITH GYRUS (TURBT-GYRUS) N/A 12/31/2013   Procedure: TRANSURETHRAL RESECTION OF BLADDER TUMOR WITH GYRUS (TURBT-GYRUS);  Surgeon: Alexis Frock, MD;  [facility];  Service: Urology;  Laterality: N/A;   ZENKER'S DIVERTICULECTOMY N/A 09/09/2015   Procedure: ENDOSCOPIC ZENKER'S DIVERTICULECTOMY;  Surgeon: Izora Gala, MD;  Location: [place];  Service: ENT;  Laterality: N/A;   ZENKER'S DIVERTICULECTOMY ENDOSCOPIC  09/09/2015    Social History  reports that he quit smoking about 52 years ago. His smoking use included cigarettes, cigars, and pipe. He has a 40.00 pack-year smoking history. He has never used smokeless tobacco. He reports that he does not drink alcohol and does not use drugs.  No Known Allergies  Family history: No pertinent history appreciated while reviewing patient's medical records Patient unable to provide any family history currently due to increase ongoing shortness of breath on high flow nasal cannula supplementation.  Overall minimally responsive.  Prior to Admission medications   Medication Sig Start Date End Date Taking? Authorizing Provider  albuterol (VENTOLIN HFA) 108 (90 Base) MCG/ACT inhaler Inhale 2 puffs into the lungs every 4 (four) hours as needed for shortness of breath or wheezing. 01/26/21  Yes [provider]  ascorbic acid (VITAMIN C) 500 MG tablet Take 500 mg by mouth daily.   Yes [provider]  aspirin 81 MG tablet  Take 1 tablet (81 mg total) by mouth daily. 09/09/18  Yes Herminio Commons, MD  carvedilol (COREG) 3.125 MG tablet Take 3.125 mg by mouth 2 (two) times daily. 05/17/20  Yes [provider]  ferrous sulfate 325 (65 FE) MG EC tablet Take 325 mg by mouth daily with breakfast.   Yes [provider]  furosemide (LASIX) 20 MG tablet Take 20 mg by mouth daily. 09/29/20  Yes [provider]  nitroGLYCERIN (NITROSTAT) 0.4 MG SL tablet Place 1 tablet (0.4 mg total) under the tongue every 5 (five) minutes as needed for chest pain. 07/03/14  Yes Herminio Commons, MD  potassium chloride (KLOR-CON) 10 MEQ tablet Take 10 mEq by mouth See admin instructions. 09/29/20  Yes [provider]  sertraline (ZOLOFT) 50 MG tablet Take 50 mg by mouth daily. 06/30/19  Yes [provider]  Cholecalciferol (VITAMIN D3) 25 MCG (1000 UT) CAPS Take 1 capsule by mouth daily. Patient not taking: No sig reported    [provider]  finasteride (PROSCAR) 5 MG tablet Take 5 mg by mouth daily. Patient not taking: No sig reported    [provider]  guaiFENesin-dextromethorphan (ROBITUSSIN DM) 100-10 MG/5ML syrup Take 10 mLs by mouth every 8 (eight) hours  for 5 days. Patient not taking: No sig reported 04/06/21 04/11/21  Deatra James, MD  insulin aspart (NOVOLOG) 100 UNIT/ML injection Inject 0-5 Units into the skin at bedtime. Patient not taking: No sig reported 04/06/21   Shahmehdi, Erling Conte A, MD  insulin detemir (LEVEMIR) 100 UNIT/ML injection Inject 0.05 mLs (5 Units total) into the skin at bedtime. Patient not taking: No sig reported 04/06/21   Shahmehdi, Erling Conte A, MD  melatonin 5 MG TABS Take 5 mg by mouth at bedtime. Take with 3 mg Patient not taking: No sig reported    [provider]  methylPREDNISolone (MEDROL DOSEPAK) 4 MG TBPK tablet Medrol Dosepak take as instructed Patient not taking: No sig reported 04/06/21   Deatra James, MD  Misc Natural  Products (OSTEO BI-FLEX ADV JOINT SHIELD) TABS Take 1 tablet by mouth daily.  Patient not taking: No sig reported    [provider]  Multiple Vitamins-Minerals (CENTRUM SILVER ADULT 50+) TABS Take 1 tablet by mouth every morning. Patient not taking: No sig reported    [provider]  Omega-3 Fatty Acids (FISH OIL) 1000 MG CAPS Take 1 capsule by mouth daily. Patient not taking: No sig reported    [provider]    Physical Exam: Vitals:   04/08/21 1300 04/08/21 1430 04/08/21 1500 04/08/21 1530  BP: (!) 129/58  (!) 111/55 113/68  Pulse: 69 77 70 76  Resp: '18 19 15 19  '$ Temp:      TempSrc:      SpO2: 95% 93% 93% 94%    Constitutional: Minimally responsive at this time.  Denying chest pain, no nausea or vomiting.  Afebrile. Vitals:   04/08/21 1300 04/08/21 1430 04/08/21 1500 04/08/21 1530  BP: (!) 129/58  (!) 111/55 113/68  Pulse: 69 77 70 76  Resp: '18 19 15 19  '$ Temp:      TempSrc:      SpO2: 95% 93% 93% 94%   Eyes: PERRL, lids and conjunctivae normal, no icterus. ENMT: Mucous membranes are moist. Posterior pharynx clear of any exudate or lesions. Neck: normal, supple, no masses, no thyromegaly Respiratory: Diffuse rhonchi bilaterally; no wheezing.  Positive tachypnea.  Patient is requiring 15 L high flow nasal cannula supplementation. Cardiovascular: Regular rate and rhythm on current exam; no rubs, no gallops, no bruits. Abdomen: no tenderness, no masses palpated. No hepatosplenomegaly. Bowel sounds positive.  Musculoskeletal: no clubbing / cyanosis.  No joint deformity appreciated. Skin: no petechiae.  Limited examination secondary to high oxygen supplementation and decreased cooperation. Neurologic: CN 2-12 grossly intact.  No focal deficits identified; following simple commands intermittently. Psychiatric: Unable to properly assess in current mentation.   Labs on Admission: I have personally reviewed following labs and imaging  studies  CBC: Recent Labs  Lab 04/03/21 0657 04/05/21 0404 04/06/21 0702 04/08/21 0811  WBC 19.9* 20.8* 18.0* 38.8*  NEUTROABS  --   --   --  36.8*  HGB 16.2 15.3 15.1 17.1*  HCT 48.5 46.5 44.9 50.1  MCV 93.4 93.9 94.1 92.9  PLT 134* 74* 41* 38*    Basic Metabolic Panel: Recent Labs  Lab 04/03/21 0657 04/05/21 0404 04/08/21 0811  NA 140 141 140  K 4.7 4.4 3.8  CL 106 107 106  CO2 '26 28 26  '$ GLUCOSE 194* 164* 140*  BUN 37* 31* 41*  CREATININE 0.76 0.68 1.11  CALCIUM 8.7* 8.9 9.2    GFR: Estimated Creatinine Clearance: 51.6 mL/min (by C-G formula based on SCr of  1.11 mg/dL).  Liver Function Tests: Recent Labs  Lab 04/03/21 0657 04/08/21 0811  AST 30 25  ALT 41 36  ALKPHOS 60 59  BILITOT 1.2 1.6*  PROT 6.2* 5.8*  ALBUMIN 3.0* 3.2*    Urine analysis:    Component Value Date/Time   COLORURINE YELLOW 04/08/2021 0740   APPEARANCEUR CLOUDY (A) 04/08/2021 0740   LABSPEC 1.039 (H) 04/08/2021 0740   PHURINE 5.0 04/08/2021 0740   GLUCOSEU NEGATIVE 04/08/2021 0740   HGBUR MODERATE (A) 04/08/2021 0740   BILIRUBINUR NEGATIVE 04/08/2021 0740   KETONESUR NEGATIVE 04/08/2021 0740   PROTEINUR 30 (A) 04/08/2021 0740   UROBILINOGEN 1.0 11/27/2013 2214   NITRITE NEGATIVE 04/08/2021 0740   LEUKOCYTESUR LARGE (A) 04/08/2021 0740    Radiological Exams on Admission: CT Angio Chest Pulmonary Embolism (PE) W or WO Contrast  Result Date: 04/08/2021 CLINICAL DATA:  PE suspected. Decreased oxygen saturation. Shortness of breath. EXAM: CT ANGIOGRAPHY CHEST WITH CONTRAST TECHNIQUE: Multidetector CT imaging of the chest was performed using the standard protocol during bolus administration of intravenous contrast. Multiplanar CT image reconstructions and MIPs were obtained to evaluate the vascular anatomy. CONTRAST:  176m OMNIPAQUE IOHEXOL 350 MG/ML SOLN COMPARISON:  03/26/21. FINDINGS: Cardiovascular: Exam detail is diminished by motion artifact. Within this limitation there is no  signs of acute pulmonary embolus. Heart size normal. No pericardial effusion. Aortic atherosclerosis and multi vessel coronary artery calcifications noted. Mediastinum/Nodes: Subcarinal lymph node measures 1.6 cm, image 51/4. Likely reactive previously this measured the same. Normal appearance of the thyroid gland. The trachea appears patent and is midline. Normal appearance of the esophagus. Lungs/Pleura: On today's exam there is marked airspace consolidation involving the posterior right lower lobe, significantly increased from previous exam. Progressive subpleural airspace densities within the posterolateral left lower lobe are also noted. Significant interval increase in bilateral upper lobe ground-glass opacities. Upper Abdomen: No acute findings.  Aortic atherosclerosis. Musculoskeletal: Thoracic spondylosis. No acute or suspicious osseous abnormality. Review of the MIP images confirms the above findings. IMPRESSION: 1. Exam detail is diminished by motion artifact. Within this limitation there is no evidence for acute pulmonary embolus. 2. Marked interval increase in airspace consolidation involving the posterior right lower lobe with progressive subpleural airspace densities within the posterolateral left lower lobe. Significant interval increase in bilateral upper lobe ground-glass opacities. Imaging findings compatible with progressive multifocal infection. 3. Aortic atherosclerosis and multi vessel coronary artery calcifications. 4. Enlarged subcarinal lymph node is likely reactive in etiology. Aortic Atherosclerosis (ICD10-I70.0). Electronically Signed   By: TKerby MoorsM.D.   On: 04/08/2021 13:10   DG Chest Port 1 View  Result Date: 04/08/2021 CLINICAL DATA:  Cough and shortness of breath. Recent COVID infection. EXAM: PORTABLE CHEST 1 VIEW COMPARISON:  Chest x-ray dated April 05, 2021. FINDINGS: The heart size and mediastinal contours are within normal limits. Peripheral predominant interstitial  and hazy airspace opacities in both lungs are similar to prior study. No pleural effusion or pneumothorax. No acute osseous abnormality. IMPRESSION: 1. Unchanged peripheral predominant interstitial and hazy airspace opacities in both lungs likely related to COVID infection. Electronically Signed   By: WTitus DubinM.D.   On: 04/08/2021 08:02   ECHOCARDIOGRAM COMPLETE  Result Date: 04/08/2021    ECHOCARDIOGRAM REPORT   Patient Name:   OROTH HOPFDate of Exam: 04/08/2021 Medical Rec #:  [id number]     Height:       72.0 in Accession #:    [id number]    Weight:  216.7 lb Date of Birth:  25-Sep-1926       BSA:          2.204 m Patient Age:    87 years       BP:           114/59 mmHg Patient Gender: M              HR:           76 bpm. Exam Location:  [name] Procedure: 2D Echo, Cardiac Doppler and Color Doppler Indications:    Elevated Troponin  History:        Patient has prior history of Echocardiogram examinations, most                 recent 09/26/2017. CHF, CAD, COPD, Arrythmias:Atrial Fibrillation,                 Signs/Symptoms:Resp. failure, Covid+; Risk Factors:Hypertension                 and Former Smoker.  Sonographer:    Dustin Flock RDCS Referring Phys: [phone] Arad Burston  Sonographer Comments: Technically difficult study due to poor echo windows. Image acquisition challenging due to COPD. IMPRESSIONS  1. Left ventricular ejection fraction, by estimation, is 65 to 70%. The left ventricle has normal function. The left ventricle has no regional wall motion abnormalities. There is moderate left ventricular hypertrophy. Left ventricular diastolic parameters are indeterminate.  2. Right ventricular systolic function is normal. The right ventricular size is normal. There is normal pulmonary artery systolic pressure. The estimated right ventricular systolic pressure is [id number] mmHg.  3. The mitral valve is degenerative. Trivial mitral valve regurgitation. Moderate mitral annular calcification.   4. The aortic valve is tricuspid. There is mild calcification of the aortic valve. Aortic valve regurgitation is not visualized. Mild, low gradient aortic valve stenosis. Aortic valve area, by VTI measures 1.64 cm. Aortic valve mean gradient measures 6.0 mmHg. Dimentionless index 0.43.  5. The inferior vena cava is normal in size with greater than 50% respiratory variability, suggesting right atrial pressure of 3 mmHg. Comparison(s): No prior Echocardiogram. FINDINGS  Left Ventricle: Left ventricular ejection fraction, by estimation, is 65 to 70%. The left ventricle has normal function. The left ventricle has no regional wall motion abnormalities. The left ventricular internal cavity size was normal in size. There is  moderate left ventricular hypertrophy. Left ventricular diastolic parameters are indeterminate. Right Ventricle: The right ventricular size is normal. No increase in right ventricular wall thickness. Right ventricular systolic function is normal. There is normal pulmonary artery systolic pressure. The tricuspid regurgitant velocity is 2.41 m/s, and  with an assumed right atrial pressure of 3 mmHg, the estimated right ventricular systolic pressure is [id number] mmHg. Left Atrium: Left atrial size was normal in size. Right Atrium: Right atrial size was normal in size. Pericardium: There is no evidence of pericardial effusion. Presence of pericardial fat pad. Mitral Valve: The mitral valve is degenerative in appearance. Moderate mitral annular calcification. Trivial mitral valve regurgitation. Tricuspid Valve: The tricuspid valve is grossly normal. Tricuspid valve regurgitation is trivial. Aortic Valve: The aortic valve is tricuspid. There is mild calcification of the aortic valve. There is mild aortic valve annular calcification. Aortic valve regurgitation is not visualized. Mild aortic stenosis is present. Aortic valve mean gradient measures 6.0 mmHg. Aortic valve peak gradient measures 9.2 mmHg. Aortic  valve area, by VTI measures 1.64 cm. Pulmonic Valve: The pulmonic valve was grossly normal. Pulmonic  valve regurgitation is mild. Aorta: The aortic root is normal in size and structure. Venous: The inferior vena cava is normal in size with greater than 50% respiratory variability, suggesting right atrial pressure of 3 mmHg. IAS/Shunts: No atrial level shunt detected by color flow Doppler.  LEFT VENTRICLE PLAX 2D LVIDd:         3.36 cm  Diastology LVIDs:         2.28 cm  LV e' medial:    4.68 cm/s LV PW:         1.42 cm  LV E/e' medial:  10.7 LV IVS:        1.52 cm  LV e' lateral:   7.83 cm/s LVOT diam:     2.20 cm  LV E/e' lateral: 6.4 LV SV:         42 LV SV Index:   19 LVOT Area:     3.80 cm  RIGHT VENTRICLE RV Basal diam:  3.22 cm RV S prime:     10.80 cm/s TAPSE (M-mode): 2.6 cm LEFT ATRIUM             Index       RIGHT ATRIUM           Index LA diam:        3.20 cm 1.45 cm/m  RA Area:     13.90 cm LA Vol (A2C):   50.2 ml 22.77 ml/m RA Volume:   36.20 ml  16.42 ml/m LA Vol (A4C):   40.8 ml 18.51 ml/m LA Biplane Vol: 48.5 ml 22.00 ml/m  AORTIC VALVE AV Area (Vmax):    2.00 cm AV Area (Vmean):   1.59 cm AV Area (VTI):     1.64 cm AV Vmax:           152.00 cm/s AV Vmean:          109.000 cm/s AV VTI:            0.255 m AV Peak Grad:      9.2 mmHg AV Mean Grad:      6.0 mmHg LVOT Vmax:         80.00 cm/s LVOT Vmean:        45.700 cm/s LVOT VTI:          0.110 m LVOT/AV VTI ratio: 0.43  AORTA Ao Root diam: 3.50 cm MITRAL VALVE                TRICUSPID VALVE MV Area (PHT): 3.63 cm     TR Peak grad:   23.2 mmHg MV Decel Time: 209 msec     TR Vmax:        241.00 cm/s MV E velocity: 50.00 cm/s MV A velocity: 113.00 cm/s  SHUNTS MV E/A ratio:  0.44         Systemic VTI:  0.11 m                             Systemic Diam: 2.20 cm Rozann Lesches MD Electronically signed by Rozann Lesches MD Signature Date/Time: 04/08/2021/1:03:40 PM    Final     EKG: Independently reviewed.  No acute ischemic changes  appreciated; sinus rhythm currently.  Assessment/Plan 1-severe sepsis secondary to pneumonia  -acute on chronic respiratory failure with hypoxia and recent hospitalization for COVID -With concerns for superimposed bacterial pneumonia and heart failure exacerbation. -Patient with elevated BNP and elevated troponin -Denies chest pain -Complaining of  intermittent productive coughing spells and shortness of breath. -No fever -Elevated respiratory rate, significant hypoxia, elevated WBCs and elevated lactic acid; meeting criteria for severe sepsis. -CT angiogram demonstrating worsening multifocal pneumonia; no pulmonary embolism. -Following cardiology recommendations we will repeat 2D echo and cycle cardiac enzymes.  Not a candidate for interventions. -Gentle fluid resuscitation with concerns for CHF in place -Continue IV broad-spectrum antibiotics. -Patient is DNR/DNI -overall Prognosis poor and guarded. -solumedrol started and will use PRN bronchodilators. -Wean off oxygen supplementation as tolerated.  2-elevated troponin -No acute ischemic changes on EKG -Probably demand ischemia -Will check 2D echo and cycle cardiac enzymes -Resume beta-blockers in AM.  3-recent COVID infection -Treated on a back-to-back admissions with remdesivir, steroids, and Actemra  -now with concerns for superimposed bacterial component  -will provide treatment as mentioned above -continue contact precautions   4-HTN -stable currently -will hold on antihypertensive agents currently to minimize chances of dropping BP in setting of sepsis. -planning to resume low dose b-blocker in am if VS remains stable.  5-GERD/GI prophylaxis -continue PPI  6-dysphagia -with concerns for aspiration -speech therapy re-consulted -NPO overnight   7-DNR/DNI -Appreciate evaluation assistance by palliative care -Poor prognosis and guarded prognosis currently -Hospital death will not be a surprise. -Will respect wishes  of DNR/DNI status. -Continue to treat was treatable and follow clinical response.  8-chronic diastolic heart failure -Last ejection fraction 50 to 55% -Elevated BNP appreciated at time of admission -Will be judicious about fluid resuscitation -Follow daily weights and strict I's and O's. -Holding diuretics initially in the setting of severe sepsis.  9-thrombocytopenia -In the setting of sepsis most likely -Platelets count less than 50,000 currently -Avoiding heparin pleural -Patient at high risk for DVT given recent COVID infection creating risk for hypercoagulable status and bedbound.  10-chronic paroxysmal atrial fibrillation -Currently sinus rhythm -Telemetry monitoring will be provided -Resume beta-blocker management in a.m. if otherwise a stable blood pressure seen. -Not on chronic anticoagulation.   DVT prophylaxis: SCD's Code Status:   DNR/DNI Family Communication:  No family at bedside.  Patient's healthcare power of attorney notified over the phone. Disposition Plan:   Patient is from:  Skilled nursing facility  Anticipated DC to:  To be determined (in the back to skilled nursing facility, hospital death or residential hospice).  Anticipated DC date:  To be determined.  Anticipated DC barriers: Stabilization of respiratory status.  Consults called:  Cardiology service curbside (Dr. Domenic Polite).                                      Palliative Care Admission status:  Inpatient status, stepdown, length of stay more than 2 midnights.  Severity of Illness: The appropriate patient status for this patient is INPATIENT. Inpatient status is judged to be reasonable and necessary in order to provide the required intensity of service to ensure the patient's safety. The patient's presenting symptoms, physical exam findings, and initial radiographic and laboratory data in the context of their chronic comorbidities is felt to place them at high risk for further clinical deterioration.  Furthermore, it is not anticipated that the patient will be medically stable for discharge from the hospital within 2 midnights of admission. The following factors support the patient status of inpatient.   " The patient's presenting symptoms include shortness of breath and hypoxia. " The physical exam findings include oxygen saturation in the mid 70s on 6 L nasal cannula  supplementation; positive tachypnea complaints of shortness of breath.. " The initial radiographic and laboratory data are WBCs 38,000, lactic acid 3.6, BNP 475, high sensitive troponin 154, respiratory rate of 26 on presentation.  CT angiogram demonstrating worsening infiltrates in his right lung lower lobe and also left lung upper lobe (multifocal pneumonia). " The chronic co-morbidities include chronic diastolic heart failure, coronary artery disease, recent severe COVID infection leading to oxygen dependent resp failure (5L [place] at discharge).   * I certify that at the point of admission it is my clinical judgment that the patient will require inpatient hospital care spanning beyond 2 midnights from the point of admission due to high intensity of service, high risk for further deterioration and high frequency of surveillance required.Barton Dubois MD [facility]  How to contact the [facility] Attending or Consulting provider [place] or covering provider during after hours [place], for this patient?   Check the care team in [facility] and look for a) attending/consulting [facility] provider listed and b) the [facility] team listed Log into [email or web address] and use [place]'s universal password to access. If you do not have the password, please contact the hospital operator. Locate the [facility] provider you are looking for under [facility] and page to a number that you can be directly reached. If you still have difficulty reaching the provider, please page the [facility] (Director on Call) for the Hospitalists listed on amion for  assistance.  04/08/2021, 5:38 PM

## 2021-04-08 NOTE — Progress Notes (Signed)
Pharmacy Antibiotic Note  Manuel Le is a 87 y.o. male admitted on 04/08/2021 with pneumonia.  Pharmacy has been consulted for Vancomycin and cefepime dosing.  Plan: Vancomycin 2000 mg IV x 1 dose. Vancomycin 1250 mg IV every 24 hours. Cefepime 2000 mg IV every 12 hours. Monitor labs, c/s, and vanco level as indicated.     Temp (24hrs), Avg:98.5 F (36.9 C), Min:98.1 F (36.7 C), Max:98.9 F (37.2 C)  Recent Labs  Lab 04/03/21 0657 04/05/21 0404 04/06/21 0702 04/08/21 0811 04/08/21 1018  WBC 19.9* 20.8* 18.0* 38.8*  --   CREATININE 0.76 0.68  --  1.11  --   LATICACIDVEN  --   --   --  2.7* 3.6*    Estimated Creatinine Clearance: 51.6 mL/min (by C-G formula based on SCr of 1.11 mg/dL).    No Known Allergies  Antimicrobials this admission: Vanco 8/19 >> Cefepime 8/19 >>  Microbiology results: 8/19 BCx: pending 8/19 UCx: pending  8/19 MRSA PCR: pending  Thank you for allowing pharmacy to be a part of this patient's care.  Ramond Craver 04/08/2021 11:25 AM

## 2021-04-08 NOTE — Consult Note (Signed)
Palliative Care Consult Note                                  Date: 04/08/2021   Patient Name: Manuel Le  DOB: [birth year]  MRN: [id number]  Age / Sex: 87 y.o., male  PCP: Caprice Renshaw, MD Referring Physician: Barton Dubois, MD  Reason for Consultation: Establishing goals of care  HPI/Patient Profile: 87 y.o. male  with past medical history of chronic A. fib/CAD, HTN, BPH, CHF. The patient was recently admitted to the hospital from 03/18/2021 through 03/22/2021 for COVID-pneumonia and treated with remdesivir and steroids, discharged home with home health after patient and family refused SNF rehab. He was again admitted  admitted 7/29 - 8/18 (discharged yesterday) for Acute respiratory failure due to COVID-19 with prolonged high O2 demands (10-12 lpm [place]) despite IV solumedrol, previous treatments, and Actemra. He was again admitted on 04/08/2021 after 1 day at SNF with shortness of breath. He was found to be hypoxic with O2 sat 74% on 6 lpm. He was started on 15 lpm hi-flow [place] with sats around 94%.  Admitted with diagnosis of severe hypoxic respiratory failure secondary to heart failure and COVID. Cardiology recommends echo and supportive care.  Currently multiple indicators of continued severe disease and very poor prognosis:  with lactic acid of 2.7 -> 3.6, WBC count 38. (Was 18 two days ago), troponin 154, BNP 475 (was 68 last admission), requirement of 15 lpm Hi-Flow O2.  Past Medical History:  Diagnosis Date   Arthritis    OSTEO   Atrial fibrillation and flutter ([place])    Bladder cancer ([place])    Coronary artery disease    Dysrhythmia    Exertional angina ([place]) 07/08/2014   H/O urinary frequency    Hematuria    Hypertension    Iron deficiency anemia    Kidney stones    hx of    Mild obstructive sleep apnea    no cpap   S/P coronary artery stent placement 07/24/2014   Skin cancer    Weakness     Social History   Socioeconomic  History   Marital status: Widowed    Spouse name: Not on file   Number of children: Not on file   Years of education: Not on file   Highest education level: Not on file  Occupational History   Not on file  Tobacco Use   Smoking status: Former    Packs/day: 2.00    Years: 20.00    Pack years: 40.00    Types: Cigarettes, Cigars, Pipe    Quit date: 08/21/1968    Years since quitting: 52.6   Smokeless tobacco: Never   Tobacco comments:    quit smoking in 1980  Substance and Sexual Activity   Alcohol use: No    Alcohol/week: 0.0 standard drinks   Drug use: No   Sexual activity: Not on file  Other Topics Concern   Not on file  Social History Narrative   Not on file   Social Determinants of Health   Financial Resource Strain: Not on file  Food Insecurity: Not on file  Transportation Needs: Not on file  Physical Activity: Not on file  Stress: Not on file  Social Connections: Not on file    No family history on file.  Subjective:   This NP Walden Field reviewed medical records, received report from team, assessed the patient and then meet  at the patient's bedside  to discuss diagnosis, prognosis, GOC, EOL wishes disposition and options.   Concept of Palliative Care was introduced as specialized medical care for people and their families living with serious illness.  If focuses on providing relief from the symptoms and stress of a serious illness.  The goal is to improve quality of life for both the patient and the family. Values and goals of care important to patient and family were attempted to be elicited.  Created space and opportunity for patient  and family to explore thoughts and feelings regarding current medical situation   Natural trajectory and expectations at EOL were discussed. Questions and concerns addressed. Patient  encouraged to call with questions or concerns.    Today's Discussion: As described above the patient is minimally responsive at this time.  I did  call the patient's healthcare power of attorney [name] to discuss the patient's current situation.  He has not been given any information related to the patient's since he was brought back to the hospital.  We discussed his clinical situation including admission from the skilled nursing facility with a saturation of 74% on 6 L/min.  He is currently requiring 15 L of high flow nasal cannula oxygen.  We discussed labs, responsiveness, and high oxygen requirement despite ongoing treatment over the past 2 admissions for COVID-19 and the very poor prognosis that this indicates.  He asked for advice and recommendations.  We discussed that the patient likely is near the end of his life.  This is probably because of the damage that has been done with COVID-19 infection and pneumonia given his chronic comorbidities and advanced age and his body's inability to "bounce back".  I told him that I felt he likely had days to a week with aggressive care but with less aggressive care/comfort focus closer to hours to days.  He indicates this is hard to hear.  He shares with me that his wife is also in hospice care at this time and he is going through a lot.  He understands that the patient is unlikely to survive much longer.  He asks if a decision is required today which I told him it is not.  He would like time to discuss with other family members so that he can ensure they are all in the same page, though he feels like they will likely agree to comfort care.  However, he would like the opportunity to do this and in the meantime take it a day at a time with no escalation of care and continue DNR as per his last admission.  He is asked that the hospitalist team call him daily with an update on the patient's status that can allow them to make appropriate decisions.  He is hopeful to have a decision about comfort care in the next day or 2.  Review of Systems  Unable to perform ROS: Mental status change    Objective:   Primary Diagnoses: Present on Admission:  Acute on chronic respiratory failure with hypoxia (HCC)   Scheduled Meds:   Continuous Infusions:  ceFEPime (MAXIPIME) IV     lactated ringers 100 mL/hr at 04/08/21 1255   [START ON 04/09/2021] vancomycin      PRN Meds:   No Known Allergies  Physical Exam Vitals and nursing note reviewed.  Constitutional:      General: He is sleeping.     Appearance: He is ill-appearing and toxic-appearing.     Comments: Not opening  eyes nor following simple commands  HENT:     Head: Normocephalic and atraumatic.  Pulmonary:     Effort: Pulmonary effort is normal. No respiratory distress.     Breath sounds: Rhonchi and rales present.  Abdominal:     General: Abdomen is flat.     Palpations: Abdomen is soft.  Skin:    General: Skin is warm and dry.  Neurological:     Mental Status: He is unresponsive.     Comments: Minimally responsive; answers yes/no to 1-2 questions with significant prompting, not opening eyes, not following simple commands    Vital Signs:  BP (!) 147/97   Pulse 81   Temp 98.1 F (36.7 C) (Rectal)   Resp 20   SpO2 95%  Pain Scale: PAINAD      SpO2: SpO2: 95 % O2 Device:SpO2: 95 % O2 Flow Rate: .O2 Flow Rate (L/min): 15 L/min  IO: Intake/output summary:  Intake/Output Summary (Last 24 hours) at 04/08/2021 1502 Last data filed at 04/08/2021 1208 Gross per 24 hour  Intake 400 ml  Output --  Net 400 ml    LBM:   Baseline Weight:   Most recent weight:        Palliative Assessment/Data: 10%   Advanced Care Planning:   Primary Decision Maker: HCPOA  Code Status/Advance Care Planning: DNR  A discussion was had today regarding advanced directives. Concepts specific to code status, artifical feeding and hydration, continued IV antibiotics and rehospitalization was had.  The difference between a aggressive medical intervention path and a palliative comfort care path for this patient at this  time was had.   Decisions/Changes to ACP: Remain DNR Continue treatment and oxygen levels for now  Assessment & Plan:   I have reviewed the medical record, interviewed the patient and family, and examined the patient. The following aspects are pertinent.  Impression: Very sick 87 year old gentleman with COVID-19 who is back for his third admission in the recent past.  He was discharged to skilled nursing facility on 5 L of oxygen after prolonged hospital stay on 10 to 12 L of oxygen.  Unfortunately, within a day he has had to come back due to low saturations of 74% on 6 L/min.  He is currently requiring high flow oxygen, labs including lactic acid, BNP, troponins, white blood cell count all indicate worsening disease and poor prognosis.  The patient is currently minimally responsive.  I am concerned about how ill he is and his ability to survive this hospitalization.  SUMMARY OF RECOMMENDATIONS   Continue current treatments for now DNR, as per previous (confirmed by [facility]) Family/HCPOA to discuss possible shift to comfort care Requesting to receive update daily on his current status to help in decision making If a shift to comfort care is made, would benefit from morphine gtt for dyspnea and pain management  Symptom Management:  Per primary team Palliative medicine is available to assist as needed  Palliative Prophylaxis:  Frequent Pain Assessment and dyspnea assessment  Additional Recommendations (Limitations, Scope, Preferences): Full Scope Treatment  Psycho-social/Spiritual:  Additional Recommendations: Caregiving  Support/Resources  Prognosis:  Hours - Days  Discharge Planning:  To Be Determined   Discussed with: Patient's HCPOA, nursing staff, medical staff  Thank you for allowing [place] to participate in the care of Manuel Le PMT will continue to support holistically.  Time In: 2:00 Time Out: 3:15 Time Total: 75 min  Greater than 50%  of this time was spent  counseling and coordinating care  related to the above assessment and plan.  Signed by: Walden Field, NP Palliative Medicine Team  Team Phone # [phone] (Nights/Weekends)  04/08/2021, 3:02 PM

## 2021-04-08 NOTE — ED Notes (Signed)
Placed male purewick on patient

## 2021-04-08 NOTE — Sepsis Progress Note (Signed)
Code Sepsis protocol being monitored by eLink. 

## 2021-04-08 NOTE — ED Provider Notes (Signed)
[facility] EMERGENCY DEPARTMENT Provider Note   CSN: [id number] Arrival date & time: 04/08/21  [id number]     History Chief Complaint  Patient presents with   Respiratory Distress    Manuel Le is a 87 y.o. male.  Patient presents with shortness of breath.  Patient has a history of heart failure and COPD and was just discharged from the hospital yesterday.  He was on 5 L nasal when he was sent to the nursing home.  He is on nonrebreather now   Shortness of Breath Severity:  Moderate Onset quality:  Sudden Timing:  Constant Progression:  Worsening Chronicity:  Recurrent Context: activity   Relieved by:  Nothing Worsened by:  Nothing     Past Medical History:  Diagnosis Date   Arthritis    OSTEO   Atrial fibrillation and flutter (HCC)    Bladder cancer ([place])    Coronary artery disease    Dysrhythmia    Exertional angina ([place]) 07/08/2014   H/O urinary frequency    Hematuria    Hypertension    Iron deficiency anemia    Kidney stones    hx of    Mild obstructive sleep apnea    no cpap   S/P coronary artery stent placement 07/24/2014   Skin cancer    Weakness     Patient Active Problem List   Diagnosis Date Noted   Acute respiratory disease due to COVID-19 virus 03/26/2021   Acute on chronic respiratory failure with hypoxia ([place]) 03/26/2021   Pneumonia due to COVID-19 virus 03/18/2021   Acute respiratory failure due to COVID-19 ([place]) 03/18/2021   Chronic diastolic CHF (congestive heart failure) ([place]) 03/18/2021   Generalized weakness 03/18/2021   Truncal ataxia 10/20/2020   Dizzy 10/20/2020   TIA (transient ischemic attack) 10/20/2020   Acute respiratory failure with hypoxia (HCC)    Influenza with pneumonia    Palliative care encounter    Sepsis ([place]) 09/25/2017   Fever 09/25/2017   UTI (urinary tract infection) 09/25/2017   Abnormal liver function 09/25/2017   Tachycardia 09/25/2017   Influenza A    Pressure ulcer 09/14/2015   Zenker's diverticulum  09/09/2015   Bladder cancer ([place]) 07/14/2015   CAD (coronary artery disease) 07/24/2014   S/P coronary artery stent placement (RCA bare metal stent on 07/08/14) 07/24/2014   Exertional angina ([place]) 07/08/2014   Encounter for therapeutic drug monitoring 09/12/2013   Atrial fibrillation ([place]) 09/08/2013   HTN (hypertension) 09/08/2013   Fatigue 09/08/2013   Anemia 06/26/2012    Past Surgical History:  Procedure Laterality Date   CARDIAC CATHETERIZATION     CARDIOVASCULAR STRESS TEST  12-15-2013  DR Kate Sable ([place])   MILD - MODERATE PERI-INFARCT ISCHEMIA  OF INFERIOR WALL, MID-INFEROSEPTAL WALL, MID-INFEROLATERAL WALL, & BASAL INFERIOR WALL/   NORMAL LVF /  EF 59%/  INTERMEDIATE RISK STUDY   COLONOSCOPY  07/04/2012   Procedure: COLONOSCOPY;  Surgeon: Rogene Houston, MD;  [facility]: [facility];  Service: Endoscopy;  Laterality: N/A;  320   CORONARY STENT PLACEMENT  07/08/2014   PTCA of RCA   DR COOPER   CYSTOSCOPY W/ RETROGRADES Bilateral 12/31/2013   Procedure: CYSTOSCOPY WITH RETROGRADE PYELOGRAM;  Surgeon: Alexis Frock, MD;  Location: [facility];  Service: Urology;  Laterality: Bilateral;   CYSTOSCOPY W/ RETROGRADES Bilateral 01/20/2015   Procedure: CYSTOSCOPY WITH RETROGRADE PYELOGRAM;  Surgeon: Alexis Frock, MD;  Location: [facility];  Service: Urology;  Laterality: Bilateral;   ESOPHAGOGASTRODUODENOSCOPY  07/04/2012  Procedure: ESOPHAGOGASTRODUODENOSCOPY (EGD);  Surgeon: Rogene Houston, MD;  Location[facility];  Service: Endoscopy;  Laterality: N/A;   EYE SURGERY     past cataract surgery bilat   HERNIA REPAIR Right    LEFT HEART CATHETERIZATION WITH CORONARY ANGIOGRAM N/A 07/08/2014   Procedure: LEFT HEART CATHETERIZATION WITH CORONARY ANGIOGRAM;  Surgeon: Blane Ohara, MD;  Location: [facility] CATH LAB;  Service: Cardiovascular;  Laterality: N/A;   PERCUTANEOUS CORONARY STENT INTERVENTION (PCI-S)  07/08/2014   Procedure: PERCUTANEOUS CORONARY STENT  INTERVENTION (PCI-S);  Surgeon: Blane Ohara, MD;  Location: [facility] CATH LAB;  Service: Cardiovascular;;   TOTAL KNEE ARTHROPLASTY Bilateral 2000  &  2003?   TRANSTHORACIC ECHOCARDIOGRAM  09-11-2013   MODERATE LVH/  EF 55-60%/  MILD MR & TR/  MILD TO MODERATE CALCIFIED AV WITHOUT STENOSIS/  MILD LAE/   MODERATE PR   TRANSURETHRAL RESECTION OF BLADDER TUMOR N/A 01/20/2015   Procedure: TRANSURETHRAL RESECTION OF BLADDER TUMOR (TURBT);  Surgeon: Alexis Frock, MD;  Location: [facility];  Service: Urology;  Laterality: N/A;   TRANSURETHRAL RESECTION OF BLADDER TUMOR WITH GYRUS (TURBT-GYRUS) N/A 12/31/2013   Procedure: TRANSURETHRAL RESECTION OF BLADDER TUMOR WITH GYRUS (TURBT-GYRUS);  Surgeon: Alexis Frock, MD;  Location: [facility];  Service: Urology;  Laterality: N/A;   ZENKER'S DIVERTICULECTOMY N/A 09/09/2015   Procedure: ENDOSCOPIC ZENKER'S DIVERTICULECTOMY;  Surgeon: Izora Gala, MD;  Location: [place];  Service: ENT;  Laterality: N/A;   ZENKER'S DIVERTICULECTOMY ENDOSCOPIC  09/09/2015       No family history on file.  Social History   Tobacco Use   Smoking status: Former    Packs/day: 2.00    Years: 20.00    Pack years: 40.00    Types: Cigarettes, Cigars, Pipe    Quit date: 08/21/1968    Years since quitting: 52.6   Smokeless tobacco: Never   Tobacco comments:    quit smoking in 1980  Substance Use Topics   Alcohol use: No    Alcohol/week: 0.0 standard drinks   Drug use: No    Home Medications Prior to Admission medications   Medication Sig Start Date End Date Taking? Authorizing Provider  albuterol (VENTOLIN HFA) 108 (90 Base) MCG/ACT inhaler Inhale 2 puffs into the lungs every 4 (four) hours as needed for shortness of breath or wheezing. 01/26/21  Yes [provider]  ascorbic acid (VITAMIN C) 500 MG tablet Take 500 mg by mouth daily.   Yes [provider]  aspirin 81 MG tablet Take 1 tablet (81 mg total) by mouth daily. 09/09/18  Yes Herminio Commons, MD   carvedilol (COREG) 3.125 MG tablet Take 3.125 mg by mouth 2 (two) times daily. 05/17/20  Yes [provider]  ferrous sulfate 325 (65 FE) MG EC tablet Take 325 mg by mouth daily with breakfast.   Yes [provider]  furosemide (LASIX) 20 MG tablet Take 20 mg by mouth daily. 09/29/20  Yes [provider]  nitroGLYCERIN (NITROSTAT) 0.4 MG SL tablet Place 1 tablet (0.4 mg total) under the tongue every 5 (five) minutes as needed for chest pain. 07/03/14  Yes Herminio Commons, MD  potassium chloride (KLOR-CON) 10 MEQ tablet Take 10 mEq by mouth See admin instructions. 09/29/20  Yes [provider]  sertraline (ZOLOFT) 50 MG tablet Take 50 mg by mouth daily. 06/30/19  Yes [provider]  Cholecalciferol (VITAMIN D3) 25 MCG (1000 UT) CAPS Take 1 capsule by mouth daily. Patient not taking: No sig reported  [provider]  finasteride (PROSCAR) 5 MG tablet Take 5 mg by mouth daily. Patient not taking: No sig reported    [provider]  guaiFENesin-dextromethorphan (ROBITUSSIN DM) 100-10 MG/5ML syrup Take 10 mLs by mouth every 8 (eight) hours for 5 days. Patient not taking: No sig reported 04/06/21 04/11/21  Deatra James, MD  insulin aspart (NOVOLOG) 100 UNIT/ML injection Inject 0-5 Units into the skin at bedtime. Patient not taking: No sig reported 04/06/21   Shahmehdi, Erling Conte A, MD  insulin detemir (LEVEMIR) 100 UNIT/ML injection Inject 0.05 mLs (5 Units total) into the skin at bedtime. Patient not taking: No sig reported 04/06/21   Shahmehdi, Erling Conte A, MD  melatonin 5 MG TABS Take 5 mg by mouth at bedtime. Take with 3 mg Patient not taking: No sig reported    [provider]  methylPREDNISolone (MEDROL DOSEPAK) 4 MG TBPK tablet Medrol Dosepak take as instructed Patient not taking: No sig reported 04/06/21   Deatra James, MD  Misc Natural Products (OSTEO BI-FLEX ADV JOINT SHIELD) TABS Take 1 tablet by mouth daily.   Patient not taking: No sig reported    [provider]  Multiple Vitamins-Minerals (CENTRUM SILVER ADULT 50+) TABS Take 1 tablet by mouth every morning. Patient not taking: No sig reported    [provider]  Omega-3 Fatty Acids (FISH OIL) 1000 MG CAPS Take 1 capsule by mouth daily. Patient not taking: No sig reported    [provider]    Allergies    Patient has no known allergies.  Review of Systems   Review of Systems  Unable to perform ROS: Mental status change  Respiratory:  Positive for shortness of breath.    Physical Exam Updated Vital Signs BP 114/60   Pulse 88   Temp 98.1 F (36.7 C) (Rectal)   Resp 19   SpO2 (!) 89%   Physical Exam Vitals and nursing note reviewed.  Constitutional:      Appearance: He is well-developed.  HENT:     Head: Normocephalic.     Nose: Nose normal.  Eyes:     General: No scleral icterus.    Conjunctiva/sclera: Conjunctivae normal.  Neck:     Thyroid: No thyromegaly.  Cardiovascular:     Rate and Rhythm: Regular rhythm. Tachycardia present.     Heart sounds: No murmur heard.   No friction rub. No gallop.  Pulmonary:     Breath sounds: No stridor. Rales present. No wheezing.     Comments: Tachypnea Chest:     Chest wall: No tenderness.  Abdominal:     General: There is no distension.     Tenderness: There is no abdominal tenderness. There is no rebound.  Musculoskeletal:        General: Normal range of motion.     Cervical back: Neck supple.  Lymphadenopathy:     Cervical: No cervical adenopathy.  Skin:    Findings: No erythema or rash.  Neurological:     Mental Status: He is alert.     Motor: No abnormal muscle tone.     Coordination: Coordination normal.  Psychiatric:        Behavior: Behavior normal.    ED Results / Procedures / Treatments   Labs (all labs ordered are listed, but only abnormal results are displayed) Labs Reviewed  LACTIC ACID, PLASMA - Abnormal; Notable for the  following components:      Result Value   Lactic Acid, Venous 2.7 (*)  All other components within normal limits  LACTIC ACID, PLASMA - Abnormal; Notable for the following components:   Lactic Acid, Venous 3.6 (*)    All other components within normal limits  COMPREHENSIVE METABOLIC PANEL - Abnormal; Notable for the following components:   Glucose, Bld 140 (*)    BUN 41 (*)    Total Protein 5.8 (*)    Albumin 3.2 (*)    Total Bilirubin 1.6 (*)    All other components within normal limits  CBC WITH DIFFERENTIAL/PLATELET - Abnormal; Notable for the following components:   WBC 38.8 (*)    Hemoglobin 17.1 (*)    Platelets 38 (*)    Neutro Abs 36.8 (*)    Lymphs Abs 0.6 (*)    Basophils Absolute 0.2 (*)    Abs Immature Granulocytes 0.35 (*)    All other components within normal limits  APTT - Abnormal; Notable for the following components:   aPTT <20 (*)    All other components within normal limits  BRAIN NATRIURETIC PEPTIDE - Abnormal; Notable for the following components:   B Natriuretic Peptide 475.0 (*)    All other components within normal limits  TROPONIN I (HIGH SENSITIVITY) - Abnormal; Notable for the following components:   Troponin I (High Sensitivity) 154 (*)    All other components within normal limits  TROPONIN I (HIGH SENSITIVITY) - Abnormal; Notable for the following components:   Troponin I (High Sensitivity) 141 (*)    All other components within normal limits  CULTURE, BLOOD (ROUTINE X 2)  CULTURE, BLOOD (ROUTINE X 2) W REFLEX TO ID PANEL  URINE CULTURE  MRSA NEXT GEN BY PCR, NASAL  PROTIME-INR  URINALYSIS, ROUTINE W REFLEX MICROSCOPIC    EKG EKG Interpretation  Date/Time:  Friday April 08 2021 07:29:33 EDT Ventricular Rate:  82 PR Interval:  188 QRS Duration: 110 QT Interval:  391 QTC Calculation: 457 R Axis:   -41 Text Interpretation: Sinus rhythm Consider left atrial enlargement Incomplete RBBB and LAFB Probable lateral infarct, old Confirmed by  Milton Ferguson [phone]) on 04/08/2021 9:24:17 AM  Radiology DG Chest Port 1 View  Result Date: 04/08/2021 CLINICAL DATA:  Cough and shortness of breath. Recent COVID infection. EXAM: PORTABLE CHEST 1 VIEW COMPARISON:  Chest x-ray dated April 05, 2021. FINDINGS: The heart size and mediastinal contours are within normal limits. Peripheral predominant interstitial and hazy airspace opacities in both lungs are similar to prior study. No pleural effusion or pneumothorax. No acute osseous abnormality. IMPRESSION: 1. Unchanged peripheral predominant interstitial and hazy airspace opacities in both lungs likely related to COVID infection. Electronically Signed   By: Titus Dubin M.D.   On: 04/08/2021 08:02    Procedures Procedures   Medications Ordered in ED Medications  lactated ringers infusion ( Intravenous New Bag/Given 04/08/21 0819)  vancomycin (VANCOREADY) IVPB 2000 mg/400 mL (2,000 mg Intravenous New Bag/Given 04/08/21 1008)  ceFEPIme (MAXIPIME) 2 g in sodium chloride 0.9 % 100 mL IVPB (has no administration in time range)  ceFEPIme (MAXIPIME) 2 g in sodium chloride 0.9 % 100 mL IVPB (0 g Intravenous Stopped 04/08/21 0922)  ipratropium-albuterol (DUONEB) 0.5-2.5 (3) MG/3ML nebulizer solution 3 mL (3 mLs Nebulization Given 04/08/21 0846)  albuterol (PROVENTIL) (2.5 MG/3ML) 0.083% nebulizer solution 2.5 mg (2.5 mg Nebulization Given 04/08/21 0846)    ED Course  I have reviewed the triage vital signs and the nursing notes.  Pertinent labs & imaging results that were available during my care of the patient were reviewed  by me and considered in my medical decision making (see chart for details). CRITICAL CARE Performed by: Milton Ferguson Total critical care time: 40 minutes Critical care time was exclusive of separately billable procedures and treating other patients. Critical care was necessary to treat or prevent imminent or life-threatening deterioration. Critical care was time spent  personally by me on the following activities: development of treatment plan with patient and/or surrogate as well as nursing, discussions with consultants, evaluation of patient's response to treatment, examination of patient, obtaining history from patient or surrogate, ordering and performing treatments and interventions, ordering and review of laboratory studies, ordering and review of radiographic studies, pulse oximetry and re-evaluation of patient's condition. Patient with severe hypoxia secondary to heart failure and COVID.  Cardiology has been called and they are recommending an echo and supportive care   MDM Rules/Calculators/A&P                           Medicine will admit for hypoxia secondary to COVID and heart failure.  Patient also has been initially treated for sepsis Final Clinical Impression(s) / ED Diagnoses Final diagnoses:  Hypoxia    Rx / DC Orders ED Discharge Orders     None        Milton Ferguson, MD 04/08/21 1120

## 2021-04-08 NOTE — ED Notes (Signed)
Date and time results received: 04/08/21  0900   Test: Trop Critical Value: 154  Name of Provider Notified: Roderic Palau

## 2021-04-09 DIAGNOSIS — J9601 Acute respiratory failure with hypoxia: Secondary | ICD-10-CM

## 2021-04-09 LAB — PROTIME-INR
INR: 1.2 (ref 0.8–1.2)
Prothrombin Time: 14.8 seconds (ref 11.4–15.2)

## 2021-04-09 LAB — LACTIC ACID, PLASMA: Lactic Acid, Venous: 3 mmol/L (ref 0.5–1.9)

## 2021-04-09 LAB — TROPONIN I (HIGH SENSITIVITY): Troponin I (High Sensitivity): 110 ng/L (ref <18)

## 2021-04-09 LAB — CORTISOL-AM, BLOOD: Cortisol - AM: 32.9 ug/dL — ABNORMAL HIGH (ref 6.7–22.6)

## 2021-04-09 LAB — PROCALCITONIN: Procalcitonin: 0.1 ng/mL

## 2021-04-09 MED ORDER — ORAL CARE MOUTH RINSE
15.0000 mL | Freq: Two times a day (BID) | OROMUCOSAL | Status: DC
  Administered 2021-04-10 – 2021-04-12 (×2): 15 mL via OROMUCOSAL

## 2021-04-09 MED ORDER — CHLORHEXIDINE GLUCONATE 0.12 % MT SOLN
15.0000 mL | Freq: Two times a day (BID) | OROMUCOSAL | Status: DC
  Administered 2021-04-09 – 2021-04-12 (×5): 15 mL via OROMUCOSAL
  Filled 2021-04-09 (×3): qty 15

## 2021-04-09 MED ORDER — LACTATED RINGERS IV SOLN: INTRAVENOUS | Status: AC

## 2021-04-09 NOTE — Progress Notes (Signed)
Messaged Dr. Dyann Kief lactic Acid of 3.0

## 2021-04-09 NOTE — Progress Notes (Signed)
PROGRESS NOTE    Manuel Le  [id number] DOB: 29-Sep-1926 DOA: 04/08/2021 PCP: Caprice Renshaw, MD    Chief Complaint  Patient presents with   Respiratory Distress    Brief admission narrative:  Manuel Le is a 87 y.o. male with medical history significant of chronic paroxysmal atrial fibrillation, coronary artery disease, hypertension and recent hospitalizations due to COVID-19 pneumonia; who came to the emergency department from the skilled nursing facility secondary to worsening respiratory status and hypoxia.  Patient was just discharged on 04/07/2021 using 5 L nasal cannula supplementation to nursing home for rehabilitation and further care.  On the day of admission despite using 6 L nasal cannula supplementation patient's saturation was in the mid 70s.  There was no fever appreciated.  Patient was tachypneic and denies chest pain.  No nausea, no vomiting, no hematuria, no dysuria, no melena, no hematochezia, no focal deficits.   As mentioned above recent history of COVID infection; patient is nonvaccinated.  During my evaluation patient is on 15 L high flow nasal cannula supplementation to keep O2 sats above 90%.   ED Course: Chest x-ray/CT angiogram of the chest demonstrating worsening multifocal pneumonia, elevated WBCs, elevated BMP, elevated troponin and elevated WBCs.  Cultures taken, IV broad-spectrum antibiotics initiated.  Gentle bolus of IV fluids provided.  Cardiology service curbside with recommendations to check 2D echo for completeness and cycle cardiac enzymes.  Not a candidate for interventions.  TRH consulted to place patient in the hospital for further evaluation and management.     Assessment & Plan: 1-severe sepsis secondary to pneumonia -Acute on chronic respiratory failure with hypoxia and recent hospitalization for COVID as part of increased risk for decompensation. -CT angiogram demonstrating what appears to be new multifocal infiltrates suggesting healthcare  associated pneumonia versus aspiration. -Patient with elevated WBCs, tachypnea, worsening hypoxia and findings on CT chest to suggest sepsis. -Lactic acid was also elevated; trending down and currently 3.0 -Patient denies any pain. -Pending to be seen by speech therapy. -Continue current antibiotics, bronchodilators, IV fluids and supportive care. -High risk for decompensation and overall poor prognosis; family has been updated and in agreement that if patient further declines next best course of action will be to focus on comfort care management.  2-elevated troponin -No chest pain -No acute ischemic changes on EKG or telemetry -2D echo reassuring -Most likely demand ischemia.  3-chronic diastolic heart failure -Appears to be compensated -Continue to closely follow volume status and will be judicious with fluid resuscitation. -Continue holding Lasix currently.  4-essential hypertension/paroxysmal chronic atrial fibrillation -Continue low-dose carvedilol -Blood pressure and heart rate currently stable -Sinus rate and appreciated -Patient not on chronic anticoagulation and not a candidate for it either.  5-GERD/GI prophylaxis -Continue PPI  6-recent COVID-pneumonia -Patient has completed time for isolation -Continue contact precautions -Will continue steroids tapering and focus on weaning of oxygen as tolerated.  7-dysphagia -With concern for aspiration -Follow speech therapy evaluation -Currently still not awake enough to try modified consistency diet while awaiting formal evaluation. -Continue oral care twice a day.  8-DNR/DNI -Confirm and further discuss with healthcare power of attorney -Overall poor and guarded prognosis -Patient with significant risk factors and comorbidities along with recent active decline with affectation into his lungs parenchyma from COVID infection.  Will not be a surprise if patient's further rapidly decline and ended dying during this  hospitalization. -Wishes will be granted and will focus on treat was treatable.    DVT prophylaxis: SCDs  CODE STATUS:  DNR/DNI Family Communication: Patient's nephew has been updated over the phone.  04/09/2021 Disposition:   Status is: Inpatient  Remains inpatient appropriate because:IV treatments appropriate due to intensity of illness or inability to take PO  Dispo: The patient is from: SNF              Anticipated d/c is to: To be determined              Patient currently is not medically stable to d/c.   Difficult to place patient No       Consultants:  Cardiology service curbside.  Procedures: See below for x-ray reports. 2D echo:  1. Left ventricular ejection fraction, by estimation, is 65 to 70%. The  left ventricle has normal function. The left ventricle has no regional  wall motion abnormalities. There is moderate left ventricular hypertrophy.  Left ventricular diastolic  parameters are indeterminate.   2. Right ventricular systolic function is normal. The right ventricular  size is normal. There is normal pulmonary artery systolic pressure. The  estimated right ventricular systolic pressure is [id number] mmHg.   3. The mitral valve is degenerative. Trivial mitral valve regurgitation.  Moderate mitral annular calcification.   4. The aortic valve is tricuspid. There is mild calcification of the  aortic valve. Aortic valve regurgitation is not visualized. Mild, low  gradient aortic valve stenosis. Aortic valve area, by VTI measures 1.64  cm. Aortic valve mean gradient measures  6.0 mmHg. Dimentionless index 0.43.   5. The inferior vena cava is normal in size with greater than 50%  respiratory variability, suggesting right atrial pressure of 3 mmHg.  Antimicrobials:  Vancomycin Cefepime    Subjective: Chronically ill in appearance; denying any pain.  Sleepy and is still not as interactive as his baseline.  Requiring 8-10 L high flow nasal cannula supplementation  currently.  Afebrile  Objective: Vitals:   04/09/21 1200 04/09/21 1300 04/09/21 1400 04/09/21 1500  BP: (!) 136/53 (!) 106/48 (!) 92/55 125/78  Pulse: 78 76 61 75  Resp: '20 15 13 18  '$ Temp:      TempSrc:      SpO2: 94% 99% 97% 94%    Intake/Output Summary (Last 24 hours) at 04/09/2021 1654 Last data filed at 04/09/2021 1500 Gross per 24 hour  Intake 604.02 ml  Output 350 ml  Net 254.02 ml   There were no vitals filed for this visit.  Examination:  General exam: Appears calm and comfortable; eyes closed throughout evaluation but able to answer simple questions appropriately.  Denies any pain.  No using accessory muscle.  Dry mucous membranes appreciated. Respiratory system: No wheezing, positive scattered rhonchi bilaterally; no using accessory muscles. Cardiovascular system: S1 & S2 heard, RRR.  No rubs, no gallops, no JVD on exam. Gastrointestinal system: Abdomen is nondistended, soft and nontender. No organomegaly or masses felt. Normal bowel sounds heard. Central nervous system: No new focal deficits.  Generally weak; frail and significantly deconditioned. Extremities: No cyanosis or clubbing.  No edema appreciated on exam. Skin: No petechiae. Psychiatry: Mood & affect appropriate.     Data Reviewed: I have personally reviewed following labs and imaging studies  CBC: Recent Labs  Lab 04/03/21 0657 04/05/21 0404 04/06/21 0702 04/08/21 0811  WBC 19.9* 20.8* 18.0* 38.8*  NEUTROABS  --   --   --  36.8*  HGB 16.2 15.3 15.1 17.1*  HCT 48.5 46.5 44.9 50.1  MCV 93.4 93.9 94.1 92.9  PLT 134* 74* 41* 38*  Basic Metabolic Panel: Recent Labs  Lab 04/03/21 0657 04/05/21 0404 04/08/21 0811 04/08/21 1803  NA 140 141 140  --   K 4.7 4.4 3.8  --   CL 106 107 106  --   CO2 '26 28 26  '$ --   GLUCOSE 194* 164* 140*  --   BUN 37* 31* 41*  --   CREATININE 0.76 0.68 1.11  --   CALCIUM 8.7* 8.9 9.2  --   MG  --   --   --  2.3  PHOS  --   --   --  4.3    GFR: Estimated  Creatinine Clearance: 51.6 mL/min (by C-G formula based on SCr of 1.11 mg/dL).  Liver Function Tests: Recent Labs  Lab 04/03/21 0657 04/08/21 0811  AST 30 25  ALT 41 36  ALKPHOS 60 59  BILITOT 1.2 1.6*  PROT 6.2* 5.8*  ALBUMIN 3.0* 3.2*    CBG: Recent Labs  Lab 04/06/21 1638 04/06/21 2114 04/07/21 0748 04/07/21 1124 04/07/21 1647  GLUCAP 135* 147* 146* 210* 256*     Recent Results (from the past 240 hour(s))  Blood Culture (routine x 2)     Status: None (Preliminary result)   Collection Time: 04/08/21  8:46 AM   Specimen: BLOOD  Result Value Ref Range Status   Specimen Description BLOOD RIGHT ANTECUBITAL  Final   Special Requests   Final    BOTTLES DRAWN AEROBIC AND ANAEROBIC Blood Culture adequate volume   Culture   Final    NO GROWTH < 24 HOURS Performed at [facility], [address], [place], [place] [address]    Report Status PENDING  Incomplete  Culture, blood (Routine X 2) w Reflex to ID Panel     Status: None (Preliminary result)   Collection Time: 04/08/21 10:20 AM   Specimen: BLOOD  Result Value Ref Range Status   Specimen Description BLOOD LEFT ANTECUBITAL  Final   Special Requests   Final    BOTTLES DRAWN AEROBIC ONLY Blood Culture results may not be optimal due to an inadequate volume of blood received in culture bottles   Culture   Final    NO GROWTH < 24 HOURS Performed at [facility], [address] [place], [place] [address]    Report Status PENDING  Incomplete     Radiology Studies: CT Angio Chest Pulmonary Embolism (PE) W or WO Contrast  Result Date: 04/08/2021 CLINICAL DATA:  PE suspected. Decreased oxygen saturation. Shortness of breath. EXAM: CT ANGIOGRAPHY CHEST WITH CONTRAST TECHNIQUE: Multidetector CT imaging of the chest was performed using the standard protocol during bolus administration of intravenous contrast. Multiplanar CT image reconstructions and MIPs were obtained to evaluate the vascular anatomy. CONTRAST:  183m OMNIPAQUE  IOHEXOL 350 MG/ML SOLN COMPARISON:  03/26/21. FINDINGS: Cardiovascular: Exam detail is diminished by motion artifact. Within this limitation there is no signs of acute pulmonary embolus. Heart size normal. No pericardial effusion. Aortic atherosclerosis and multi vessel coronary artery calcifications noted. Mediastinum/Nodes: Subcarinal lymph node measures 1.6 cm, image 51/4. Likely reactive previously this measured the same. Normal appearance of the thyroid gland. The trachea appears patent and is midline. Normal appearance of the esophagus. Lungs/Pleura: On today's exam there is marked airspace consolidation involving the posterior right lower lobe, significantly increased from previous exam. Progressive subpleural airspace densities within the posterolateral left lower lobe are also noted. Significant interval increase in bilateral upper lobe ground-glass opacities. Upper Abdomen: No acute findings.  Aortic atherosclerosis. Musculoskeletal: Thoracic spondylosis.  No acute or suspicious osseous abnormality. Review of the MIP images confirms the above findings. IMPRESSION: 1. Exam detail is diminished by motion artifact. Within this limitation there is no evidence for acute pulmonary embolus. 2. Marked interval increase in airspace consolidation involving the posterior right lower lobe with progressive subpleural airspace densities within the posterolateral left lower lobe. Significant interval increase in bilateral upper lobe ground-glass opacities. Imaging findings compatible with progressive multifocal infection. 3. Aortic atherosclerosis and multi vessel coronary artery calcifications. 4. Enlarged subcarinal lymph node is likely reactive in etiology. Aortic Atherosclerosis (ICD10-I70.0). Electronically Signed   By: Kerby Moors M.D.   On: 04/08/2021 13:10   DG Chest Port 1 View  Result Date: 04/08/2021 CLINICAL DATA:  Cough and shortness of breath. Recent COVID infection. EXAM: PORTABLE CHEST 1 VIEW  COMPARISON:  Chest x-ray dated April 05, 2021. FINDINGS: The heart size and mediastinal contours are within normal limits. Peripheral predominant interstitial and hazy airspace opacities in both lungs are similar to prior study. No pleural effusion or pneumothorax. No acute osseous abnormality. IMPRESSION: 1. Unchanged peripheral predominant interstitial and hazy airspace opacities in both lungs likely related to COVID infection. Electronically Signed   By: Titus Dubin M.D.   On: 04/08/2021 08:02   ECHOCARDIOGRAM COMPLETE  Result Date: 04/08/2021    ECHOCARDIOGRAM REPORT   Patient Name:   LAMBROS SPACE Date of Exam: 04/08/2021 Medical Rec #:  [id number]      Height:       72.0 in Accession #:    [id number]     Weight:       216.7 lb Date of Birth:  03/24/1927       BSA:          2.204 m Patient Age:    89 years       BP:           114/59 mmHg Patient Gender: M              HR:           76 bpm. Exam Location:  [name] Procedure: 2D Echo, Cardiac Doppler and Color Doppler Indications:    Elevated Troponin  History:        Patient has prior history of Echocardiogram examinations, most                 recent 09/26/2017. CHF, CAD, COPD, Arrythmias:Atrial Fibrillation,                 Signs/Symptoms:Resp. failure, Covid+; Risk Factors:Hypertension                 and Former Smoker.  Sonographer:    Dustin Flock RDCS Referring Phys: [phone] Ivie Maese  Sonographer Comments: Technically difficult study due to poor echo windows. Image acquisition challenging due to COPD. IMPRESSIONS  1. Left ventricular ejection fraction, by estimation, is 65 to 70%. The left ventricle has normal function. The left ventricle has no regional wall motion abnormalities. There is moderate left ventricular hypertrophy. Left ventricular diastolic parameters are indeterminate.  2. Right ventricular systolic function is normal. The right ventricular size is normal. There is normal pulmonary artery systolic pressure. The estimated right  ventricular systolic pressure is [id number] mmHg.  3. The mitral valve is degenerative. Trivial mitral valve regurgitation. Moderate mitral annular calcification.  4. The aortic valve is tricuspid. There is mild calcification of the aortic valve. Aortic valve regurgitation is not visualized. Mild, low gradient aortic valve stenosis. Aortic valve  area, by VTI measures 1.64 cm. Aortic valve mean gradient measures 6.0 mmHg. Dimentionless index 0.43.  5. The inferior vena cava is normal in size with greater than 50% respiratory variability, suggesting right atrial pressure of 3 mmHg. Comparison(s): No prior Echocardiogram. FINDINGS  Left Ventricle: Left ventricular ejection fraction, by estimation, is 65 to 70%. The left ventricle has normal function. The left ventricle has no regional wall motion abnormalities. The left ventricular internal cavity size was normal in size. There is  moderate left ventricular hypertrophy. Left ventricular diastolic parameters are indeterminate. Right Ventricle: The right ventricular size is normal. No increase in right ventricular wall thickness. Right ventricular systolic function is normal. There is normal pulmonary artery systolic pressure. The tricuspid regurgitant velocity is 2.41 m/s, and  with an assumed right atrial pressure of 3 mmHg, the estimated right ventricular systolic pressure is [id number] mmHg. Left Atrium: Left atrial size was normal in size. Right Atrium: Right atrial size was normal in size. Pericardium: There is no evidence of pericardial effusion. Presence of pericardial fat pad. Mitral Valve: The mitral valve is degenerative in appearance. Moderate mitral annular calcification. Trivial mitral valve regurgitation. Tricuspid Valve: The tricuspid valve is grossly normal. Tricuspid valve regurgitation is trivial. Aortic Valve: The aortic valve is tricuspid. There is mild calcification of the aortic valve. There is mild aortic valve annular calcification. Aortic valve  regurgitation is not visualized. Mild aortic stenosis is present. Aortic valve mean gradient measures 6.0 mmHg. Aortic valve peak gradient measures 9.2 mmHg. Aortic valve area, by VTI measures 1.64 cm. Pulmonic Valve: The pulmonic valve was grossly normal. Pulmonic valve regurgitation is mild. Aorta: The aortic root is normal in size and structure. Venous: The inferior vena cava is normal in size with greater than 50% respiratory variability, suggesting right atrial pressure of 3 mmHg. IAS/Shunts: No atrial level shunt detected by color flow Doppler.  LEFT VENTRICLE PLAX 2D LVIDd:         3.36 cm  Diastology LVIDs:         2.28 cm  LV e' medial:    4.68 cm/s LV PW:         1.42 cm  LV E/e' medial:  10.7 LV IVS:        1.52 cm  LV e' lateral:   7.83 cm/s LVOT diam:     2.20 cm  LV E/e' lateral: 6.4 LV SV:         42 LV SV Index:   19 LVOT Area:     3.80 cm  RIGHT VENTRICLE RV Basal diam:  3.22 cm RV S prime:     10.80 cm/s TAPSE (M-mode): 2.6 cm LEFT ATRIUM             Index       RIGHT ATRIUM           Index LA diam:        3.20 cm 1.45 cm/m  RA Area:     13.90 cm LA Vol (A2C):   50.2 ml 22.77 ml/m RA Volume:   36.20 ml  16.42 ml/m LA Vol (A4C):   40.8 ml 18.51 ml/m LA Biplane Vol: 48.5 ml 22.00 ml/m  AORTIC VALVE AV Area (Vmax):    2.00 cm AV Area (Vmean):   1.59 cm AV Area (VTI):     1.64 cm AV Vmax:           152.00 cm/s AV Vmean:          109.000  cm/s AV VTI:            0.255 m AV Peak Grad:      9.2 mmHg AV Mean Grad:      6.0 mmHg LVOT Vmax:         80.00 cm/s LVOT Vmean:        45.700 cm/s LVOT VTI:          0.110 m LVOT/AV VTI ratio: 0.43  AORTA Ao Root diam: 3.50 cm MITRAL VALVE                TRICUSPID VALVE MV Area (PHT): 3.63 cm     TR Peak grad:   23.2 mmHg MV Decel Time: 209 msec     TR Vmax:        241.00 cm/s MV E velocity: 50.00 cm/s MV A velocity: 113.00 cm/s  SHUNTS MV E/A ratio:  0.44         Systemic VTI:  0.11 m                             Systemic Diam: 2.20 cm Rozann Lesches MD  Electronically signed by Rozann Lesches MD Signature Date/Time: 04/08/2021/1:03:40 PM    Final      Scheduled Meds:  aspirin EC  81 mg Oral Daily   carvedilol  3.125 mg Oral BID   Chlorhexidine Gluconate Cloth  6 each Topical Q0600   fluticasone furoate-vilanterol  1 puff Inhalation Daily   methylPREDNISolone (SOLU-MEDROL) injection  40 mg Intravenous Q24H   pantoprazole (PROTONIX) IV  40 mg Intravenous Q24H   Continuous Infusions:  ceFEPime (MAXIPIME) IV Stopped (04/09/21 0840)   lactated ringers 75 mL/hr at 04/09/21 1036   vancomycin Stopped (04/09/21 1145)     LOS: 1 day    Time spent: 35 minutes   Barton Dubois, MD [facility]   To contact the attending provider between 7A-7P or the covering provider during after hours 7P-7A, please log into the web site [email or web address] and access using universal [place] password for that web site. If you do not have the password, please call the hospital operator.  04/09/2021, 4:54 PM

## 2021-04-10 LAB — BASIC METABOLIC PANEL
Anion gap: 8 (ref 5–15)
BUN: 36 mg/dL — ABNORMAL HIGH (ref 8–23)
CO2: 24 mmol/L (ref 22–32)
Calcium: 8.8 mg/dL — ABNORMAL LOW (ref 8.9–10.3)
Chloride: 114 mmol/L — ABNORMAL HIGH (ref 98–111)
Creatinine, Ser: 0.77 mg/dL (ref 0.61–1.24)
GFR, Estimated: >60 mL/min (ref >60)
Glucose, Bld: 178 mg/dL — ABNORMAL HIGH (ref 70–99)
Potassium: 3.4 mmol/L — ABNORMAL LOW (ref 3.5–5.1)
Sodium: 146 mmol/L — ABNORMAL HIGH (ref 135–145)

## 2021-04-10 LAB — CBC
HCT: 40.3 % (ref 39.0–52.0)
Hemoglobin: 13.5 g/dL (ref 13.0–17.0)
MCH: 31.3 pg (ref 26.0–34.0)
MCHC: 33.5 g/dL (ref 30.0–36.0)
MCV: 93.3 fL (ref 80.0–100.0)
Platelets: 17 10*3/uL — CL (ref 150–400)
RBC: 4.32 MIL/uL (ref 4.22–5.81)
RDW: 13.6 % (ref 11.5–15.5)
WBC: 27.5 10*3/uL — ABNORMAL HIGH (ref 4.0–10.5)
nRBC: 0 % (ref 0.0–0.2)

## 2021-04-10 LAB — MRSA NEXT GEN BY PCR, NASAL: MRSA by PCR Next Gen: NOT DETECTED

## 2021-04-10 MED ORDER — METOPROLOL TARTRATE 5 MG/5ML IV SOLN
5.0000 mg | Freq: Once | INTRAVENOUS | Status: AC
  Administered 2021-04-10: 5 mg via INTRAVENOUS
  Filled 2021-04-10: qty 5

## 2021-04-10 NOTE — Progress Notes (Signed)
PROGRESS NOTE    Manuel Le  [id number] DOB: 1927-01-02 DOA: 04/08/2021 PCP: Caprice Renshaw, MD    Chief Complaint  Patient presents with   Respiratory Distress    Brief admission narrative:  Manuel Le is a 87 y.o. male with medical history significant of chronic paroxysmal atrial fibrillation, coronary artery disease, hypertension and recent hospitalizations due to COVID-19 pneumonia; who came to the emergency department from the skilled nursing facility secondary to worsening respiratory status and hypoxia.  Patient was just discharged on 04/07/2021 using 5 L nasal cannula supplementation to nursing home for rehabilitation and further care.  On the day of admission despite using 6 L nasal cannula supplementation patient's saturation was in the mid 70s.  There was no fever appreciated.  Patient was tachypneic and denies chest pain.  No nausea, no vomiting, no hematuria, no dysuria, no melena, no hematochezia, no focal deficits.   As mentioned above recent history of COVID infection; patient is nonvaccinated.  During my evaluation patient is on 15 L high flow nasal cannula supplementation to keep O2 sats above 90%.   ED Course: Chest x-ray/CT angiogram of the chest demonstrating worsening multifocal pneumonia, elevated WBCs, elevated BMP, elevated troponin and elevated WBCs.  Cultures taken, IV broad-spectrum antibiotics initiated.  Gentle bolus of IV fluids provided.  Cardiology service curbside with recommendations to check 2D echo for completeness and cycle cardiac enzymes.  Not a candidate for interventions.  TRH consulted to place patient in the hospital for further evaluation and management.     Assessment & Plan: 1-severe sepsis secondary to pneumonia -Acute on chronic respiratory failure with hypoxia and recent hospitalization for COVID as part of increased risk for decompensation. -CT angiogram demonstrating what appears to be new multifocal infiltrates suggesting healthcare  associated pneumonia versus aspiration. -Patient with elevated WBCs, tachypnea, worsening hypoxia and findings on CT chest to suggest sepsis. -Lactic acid was also elevated; which has trended down appropriately with judicious hydration. -No fever. -Pending to be seen by speech therapy. -MRSA PCR negative, vancomycin discontinued -Continue IV cefepime. -High risk for decompensation and overall poor prognosis; family has been updated and in agreement that if patient further declines next best course of action will be to focus on comfort care management.  2-elevated troponin -No chest pain -No acute ischemic changes on EKG or telemetry -2D echo reassuring -Most likely demand ischemia.  3-chronic diastolic heart failure -Appears to be compensated -Continue to closely follow volume status and will be judicious with fluid resuscitation. -Continue holding Lasix currently.  4-essential hypertension/paroxysmal chronic atrial fibrillation -Continue low-dose carvedilol -Blood pressure and heart rate currently stable -Sinus rate and appreciated -Patient not on chronic anticoagulation and not a candidate for it either.  5-GERD/GI prophylaxis -Continue PPI  6-recent COVID-pneumonia -Patient has completed time for isolation -Continue contact precautions -Will continue steroids tapering and focus on weaning of oxygen as tolerated.  7-dysphagia -With concern for high risk aspiration -Currently still waiting to be seen by speech therapy. -Continue oral care twice a day.  8-DNR/DNI -Confirm and further discuss with healthcare power of attorney -Overall poor and guarded prognosis -Patient with significant risk factors and comorbidities along with recent active decline with affectation into his lungs parenchyma from COVID infection.  Will not be a surprise if patient's further rapidly decline and ended dying during this hospitalization. -Wishes will be granted and will focus on treat was  treatable. -Family expresses no intention for artificial nutrition.    DVT prophylaxis: SCDs  CODE STATUS:  DNR/DNI Family Communication: Patient's nephew has been updated over the phone.  04/09/2021 Disposition:   Status is: Inpatient  Remains inpatient appropriate because:IV treatments appropriate due to intensity of illness or inability to take PO  Dispo: The patient is from: SNF              Anticipated d/c is to: To be determined              Patient currently is not medically stable to d/c.   Difficult to place patient No       Consultants:  Cardiology service curbside.  Procedures: See below for x-ray reports. 2D echo:  1. Left ventricular ejection fraction, by estimation, is 65 to 70%. The  left ventricle has normal function. The left ventricle has no regional  wall motion abnormalities. There is moderate left ventricular hypertrophy.  Left ventricular diastolic  parameters are indeterminate.   2. Right ventricular systolic function is normal. The right ventricular  size is normal. There is normal pulmonary artery systolic pressure. The  estimated right ventricular systolic pressure is [id number] mmHg.   3. The mitral valve is degenerative. Trivial mitral valve regurgitation.  Moderate mitral annular calcification.   4. The aortic valve is tricuspid. There is mild calcification of the  aortic valve. Aortic valve regurgitation is not visualized. Mild, low  gradient aortic valve stenosis. Aortic valve area, by VTI measures 1.64  cm. Aortic valve mean gradient measures  6.0 mmHg. Dimentionless index 0.43.   5. The inferior vena cava is normal in size with greater than 50%  respiratory variability, suggesting right atrial pressure of 3 mmHg.  Antimicrobials:  Vancomycin Cefepime    Subjective: 8 L high flow nasal cannula supplementation in place; no chest pain, no nausea, no vomiting.  Decreased interaction on today's examination.  Objective: Vitals:   04/10/21  1330 04/10/21 1345 04/10/21 1400 04/10/21 1415  BP:   (!) 170/65   Pulse: 79 91 87 83  Resp: '15 13 17 15  '$ Temp:      TempSrc:      SpO2: 99% 100% 94% 97%  Weight:        Intake/Output Summary (Last 24 hours) at 04/10/2021 1708 Last data filed at 04/10/2021 [id number] Gross per 24 hour  Intake 673.44 ml  Output 600 ml  Net 73.44 ml   Filed Weights   04/10/21 0430  Weight: 88.7 kg    Examination: General exam: Afebrile, in no major distress.  During today's evaluation patient kept his eyes closed and was only able to answer his name.  No nausea, no vomiting, no complaints of chest pain.   Respiratory system: no Using accessory muscles, positive rhonchi bilaterally.  8 L nasal cannula supplementation in place. Cardiovascular system: Rate controlled, no rubs, no gallops, no JVD on exam. Gastrointestinal system: Abdomen is nondistended, soft and nontender. No organomegaly or masses felt. Normal bowel sounds heard. Central nervous system: No focal neurological deficits. Extremities: No cyanosis, clubbing or edema. Skin: No petechiae. Psychiatry: Stable mood; flat affect.   Data Reviewed: I have personally reviewed following labs and imaging studies  CBC: Recent Labs  Lab 04/05/21 0404 04/06/21 0702 04/08/21 0811 04/10/21 0419  WBC 20.8* 18.0* 38.8* 27.5*  NEUTROABS  --   --  36.8*  --   HGB 15.3 15.1 17.1* 13.5  HCT 46.5 44.9 50.1 40.3  MCV 93.9 94.1 92.9 93.3  PLT 74* 41* 38* 17*    Basic Metabolic Panel: Recent Labs  Lab 04/05/21 0404  04/08/21 0811 04/08/21 1803 04/10/21 0419  NA 141 140  --  146*  K 4.4 3.8  --  3.4*  CL 107 106  --  114*  CO2 28 26  --  24  GLUCOSE 164* 140*  --  178*  BUN 31* 41*  --  36*  CREATININE 0.68 1.11  --  0.77  CALCIUM 8.9 9.2  --  8.8*  MG  --   --  2.3  --   PHOS  --   --  4.3  --     GFR: Estimated Creatinine Clearance: 64.7 mL/min (by C-G formula based on SCr of 0.77 mg/dL).  Liver Function Tests: Recent Labs  Lab  04/08/21 0811  AST 25  ALT 36  ALKPHOS 59  BILITOT 1.6*  PROT 5.8*  ALBUMIN 3.2*    CBG: Recent Labs  Lab 04/06/21 1638 04/06/21 2114 04/07/21 0748 04/07/21 1124 04/07/21 1647  GLUCAP 135* 147* 146* 210* 256*     Recent Results (from the past 240 hour(s))  Urine Culture     Status: Abnormal (Preliminary result)   Collection Time: 04/08/21  7:40 AM   Specimen: In/Out Cath Urine  Result Value Ref Range Status   Specimen Description   Final    IN/OUT CATH URINE Performed at [facility], [address], [place], [place] [address]    Special Requests   Final    NONE Performed at [facility] [address], [place], [place] [address]    Culture (A)  Final    8,000 COLONIES/mL KLEBSIELLA PNEUMONIAE SUSCEPTIBILITIES TO FOLLOW Performed at [facility], [place] [address], [place], [place] [address]    Report Status PENDING  Incomplete  Blood Culture (routine x 2)     Status: None (Preliminary result)   Collection Time: 04/08/21  8:46 AM   Specimen: BLOOD  Result Value Ref Range Status   Specimen Description BLOOD RIGHT ANTECUBITAL  Final   Special Requests   Final    BOTTLES DRAWN AEROBIC AND ANAEROBIC Blood Culture adequate volume   Culture   Final    NO GROWTH < 24 HOURS Performed at [facility], [address] [place], [place] [address]    Report Status PENDING  Incomplete  Culture, blood (Routine X 2) w Reflex to ID Panel     Status: None (Preliminary result)   Collection Time: 04/08/21 10:20 AM   Specimen: BLOOD  Result Value Ref Range Status   Specimen Description BLOOD LEFT ANTECUBITAL  Final   Special Requests   Final    BOTTLES DRAWN AEROBIC ONLY Blood Culture results may not be optimal due to an inadequate volume of blood received in culture bottles   Culture   Final    NO GROWTH < 24 HOURS Performed at [facility], [address] [place], [place] [address]    Report Status PENDING  Incomplete  MRSA Next Gen by PCR, Nasal     Status: None    Collection Time: 04/09/21 10:00 AM  Result Value Ref Range Status   MRSA by PCR Next Gen NOT DETECTED NOT DETECTED Final    Comment: (NOTE) The GeneXpert MRSA Assay (FDA approved for NASAL specimens only), is one component of a comprehensive MRSA colonization surveillance program. It is not intended to diagnose MRSA infection nor to guide or monitor treatment for MRSA infections. Test performance is not FDA approved in patients less than 31 years old. Performed at [facility], [address], [place], [place]  [place]      Radiology Studies: No results found.   Scheduled Meds:  aspirin EC  81 mg Oral Daily   carvedilol  3.125 mg Oral BID   chlorhexidine  15 mL Mouth Rinse BID   Chlorhexidine Gluconate Cloth  6 each Topical Q0600   fluticasone furoate-vilanterol  1 puff Inhalation Daily   mouth rinse  15 mL Mouth Rinse q12n4p   methylPREDNISolone (SOLU-MEDROL) injection  40 mg Intravenous Q24H   pantoprazole (PROTONIX) IV  40 mg Intravenous Q24H   Continuous Infusions:  ceFEPime (MAXIPIME) IV 2 g (04/10/21 0824)     LOS: 2 days    Time spent: 35 minutes   Barton Dubois, MD [facility]   To contact the attending provider between 7A-7P or the covering provider during after hours 7P-7A, please log into the web site [email or web address] and access using universal [place] password for that web site. If you do not have the password, please call the hospital operator.  04/10/2021, 5:08 PM

## 2021-04-10 NOTE — Plan of Care (Signed)
  Problem: Education: Goal: Knowledge of General Education information will improve Description: Including pain rating scale, medication(s)/side effects and non-pharmacologic comfort measures Outcome: Not Progressing   

## 2021-04-11 DIAGNOSIS — Z7189 Other specified counseling: Secondary | ICD-10-CM

## 2021-04-11 DIAGNOSIS — Z515 Encounter for palliative care: Secondary | ICD-10-CM

## 2021-04-11 LAB — URINE CULTURE: Culture: 8000 — AB

## 2021-04-11 LAB — CREATININE, SERUM
Creatinine, Ser: 0.66 mg/dL (ref 0.61–1.24)
GFR, Estimated: >60 mL/min (ref >60)

## 2021-04-11 MED ORDER — SODIUM CHLORIDE 0.9 % IV SOLN
2.0000 g | Freq: Three times a day (TID) | INTRAVENOUS | Status: DC
  Administered 2021-04-11 – 2021-04-12 (×2): 2 g via INTRAVENOUS
  Filled 2021-04-11 (×3): qty 2

## 2021-04-11 NOTE — Progress Notes (Signed)
Palliative: [name] is lying quietly in bed.  He does not open his eyes or try to interact with me in any meaningful way.  I do not believe that he can make his basic needs known.  There is no family at bedside at this time.  Conference with attending.  Awaiting speech therapy consult.  Call to nephew/HC POA, Manuel Le.  We talked about Manuel Le's acute health concerns, his frailty, lethargy, no by mouth intake.  We review oxygen requirements and saturations, by mouth intake, speech therapy consult.  [name] states that they would not want PEG tube or artificial nutrition/hydration.  We talked about meaningful improvements for Manuel Le.  [name] states that if Manuel Le is not unable to take food and drink by mouth, they would elect residential hospice.  [name] tells me that his wife is at home under hospice care at this time.  Conference with attending, bedside nursing staff, transition of care team related to patient condition, needs, goals of care, disposition.  Plan: If unable to take food and drink by mouth safely, transition to comfort care, residential hospice.  Continue to treat the treatable but no CPR or intubation.  Time for outcomes.  22 minutes  Manuel Axe, NP Palliative medicine team Team phone [phone] Greater than 50% of this time was spent counseling and coordinating care related to the above assessment and plan.

## 2021-04-11 NOTE — Progress Notes (Signed)
Pharmacy Antibiotic Note  Manuel Le is a 87 y.o. male admitted on 04/08/2021 with pneumonia.  Pharmacy has been consulted for cefepime dosing. AF, CT angiogram demonstrating what appears to be new multifocal infiltrates suggesting healthcare associated pneumonia versus aspiration.   Plan: Continue Cefepime 2000 mg IV every 8 hours. Monitor labs, c/s, and vanco level as indicated.  Weight: 88.7 kg (195 lb 8.8 oz)  Temp (24hrs), Avg:97.8 F (36.6 C), Min:97.4 F (36.3 C), Max:98.2 F (36.8 C)  Recent Labs  Lab 04/05/21 0404 04/06/21 0702 04/08/21 0811 04/08/21 1018 04/08/21 1803 04/09/21 1033 04/10/21 0419 04/11/21 0511  WBC 20.8* 18.0* 38.8*  --   --   --  27.5*  --   CREATININE 0.68  --  1.11  --   --   --  0.77 0.66  LATICACIDVEN  --   --  2.7* 3.6* 3.1* 3.0*  --   --      Estimated Creatinine Clearance: 64.7 mL/min (by C-G formula based on SCr of 0.66 mg/dL).    No Known Allergies  Antimicrobials this admission: Vanco 8/19 >> 8/21 Cefepime 8/19 >>  Microbiology results: 8/19 BCx: ngtd 8/19 UCx: 8K CFU/ml, K. Pneumo 8/19 MRSA PCR: negative  Thank you for allowing pharmacy to be a part of this patient's care.  Isac Sarna, BS [place], [place] Clinical Pharmacist Pager [phone] 04/11/2021 8:56 AM

## 2021-04-11 NOTE — Progress Notes (Addendum)
PROGRESS NOTE    Manuel Le  [id number] DOB: [birth year] DOA: 04/08/2021 PCP: Caprice Renshaw, MD    Chief Complaint  Patient presents with   Respiratory Distress    Brief admission narrative:  Manuel Le is a 87 y.o. male with medical history significant of chronic paroxysmal atrial fibrillation, coronary artery disease, hypertension and recent hospitalizations due to COVID-19 pneumonia; who came to the emergency department from the skilled nursing facility secondary to worsening respiratory status and hypoxia.  Patient was just discharged on 04/07/2021 using 5 L nasal cannula supplementation to nursing home for rehabilitation and further care.  On the day of admission despite using 6 L nasal cannula supplementation patient's saturation was in the mid 70s.  There was no fever appreciated.  Patient was tachypneic and denies chest pain.  No nausea, no vomiting, no hematuria, no dysuria, no melena, no hematochezia, no focal deficits.   As mentioned above recent history of COVID infection; patient is nonvaccinated.  During my evaluation patient is on 15 L high flow nasal cannula supplementation to keep O2 sats above 90%.   ED Course: Chest x-ray/CT angiogram of the chest demonstrating worsening multifocal pneumonia, elevated WBCs, elevated BMP, elevated troponin and elevated WBCs.  Cultures taken, IV broad-spectrum antibiotics initiated.  Gentle bolus of IV fluids provided.  Cardiology service curbside with recommendations to check 2D echo for completeness and cycle cardiac enzymes.  Not a candidate for interventions.  TRH consulted to place patient in the hospital for further evaluation and management.     Assessment & Plan: 1-severe sepsis secondary to pneumonia -Acute on chronic respiratory failure with hypoxia and recent hospitalization for COVID as part of increased risk for decompensation. -CT angiogram demonstrating what appears to be new multifocal infiltrates suggesting healthcare  associated pneumonia versus aspiration. -Patient with elevated WBCs, tachypnea, worsening hypoxia and findings on CT chest to suggest sepsis. -Lactic acid was also elevated; which has trended down appropriately with judicious hydration. -No fever. -Dysphagia type I with thin liquids as per speech therapy recommendation -MRSA PCR negative, vancomycin discontinued -Continue IV cefepime. -High risk for decompensation and overall poor prognosis; family has been updated and in agreement that if patient further declines next best course of action will be to focus on comfort care management.  2-elevated troponin -No chest pain -No acute ischemic changes on EKG or telemetry -2D echo reassuring -Most likely demand ischemia.  3-chronic diastolic heart failure -Appears to be compensated -Continue to closely follow volume status and will be judicious with fluid resuscitation. -Continue holding Lasix currently.  4-essential hypertension/paroxysmal chronic atrial fibrillation -Continue low-dose carvedilol -Blood pressure and heart rate currently stable -Sinus rate and appreciated -Patient not on chronic anticoagulation and not a candidate for it either.  5-GERD/GI prophylaxis -Continue PPI  6-recent COVID-pneumonia -Patient has completed time for isolation -Continue contact precautions -Will continue steroids tapering and focus on weaning of oxygen as tolerated.  7-dysphagia -With concern for high risk aspiration -Seen by speech therapy with recommendation for dysphagia 1 with thin liquids. -Continue oral care twice a day.  8-DNR/DNI -Confirm and further discuss with healthcare power of attorney -Overall poor and guarded prognosis -Patient with significant risk factors and comorbidities along with recent active decline with affectation into his lungs parenchyma from COVID infection.  Will not be a surprise if patient's further rapidly decline and ended dying during this  hospitalization. -Wishes will be granted and will focus on treat was treatable. -Family expresses no intention for artificial nutrition.  DVT prophylaxis: SCDs  CODE STATUS: DNR/DNI Family Communication: Patient's nephew has been updated over the phone.  04/09/2021 Disposition:   Status is: Inpatient  Remains inpatient appropriate because:IV treatments appropriate due to intensity of illness or inability to take PO  Dispo: The patient is from: SNF              Anticipated d/c is to: To be determined              Patient currently is not medically stable to d/c.   Difficult to place patient No    Consultants:  Cardiology service curbside.  Procedures: See below for x-ray reports. 2D echo:  1. Left ventricular ejection fraction, by estimation, is 65 to 70%. The  left ventricle has normal function. The left ventricle has no regional  wall motion abnormalities. There is moderate left ventricular hypertrophy.  Left ventricular diastolic  parameters are indeterminate.   2. Right ventricular systolic function is normal. The right ventricular  size is normal. There is normal pulmonary artery systolic pressure. The  estimated right ventricular systolic pressure is [id number] mmHg.   3. The mitral valve is degenerative. Trivial mitral valve regurgitation.  Moderate mitral annular calcification.   4. The aortic valve is tricuspid. There is mild calcification of the  aortic valve. Aortic valve regurgitation is not visualized. Mild, low  gradient aortic valve stenosis. Aortic valve area, by VTI measures 1.64  cm. Aortic valve mean gradient measures  6.0 mmHg. Dimentionless index 0.43.   5. The inferior vena cava is normal in size with greater than 50%  respiratory variability, suggesting right atrial pressure of 3 mmHg.   Antimicrobials:  Vancomycin Cefepime    Subjective: Minimally responsive; no fever, no chest pain, no nausea or vomiting reported.  8 L nasal cannula  supplementation in place.  Objective: Vitals:   04/11/21 0456 04/11/21 0638 04/11/21 0730 04/11/21 1424  BP: (!) 157/98   (!) 152/83  Pulse: 92   93  Resp: 20   18  Temp: 98.1 F (36.7 C)   98.9 F (37.2 C)  TempSrc: Oral     SpO2: 93% 100% 100% 92%  Weight:        Intake/Output Summary (Last 24 hours) at 04/11/2021 1742 Last data filed at 04/11/2021 1300 Gross per 24 hour  Intake 0 ml  Output 500 ml  Net -500 ml   Filed Weights   04/10/21 0430  Weight: 88.7 kg    Examination: General exam: Minimally responsive, in no major distress.  Afebrile.   Respiratory system: No using accessory muscles; using 8 liter nasal cannula supplementation.  Positive rhonchi bilaterally. Cardiovascular system:RRR.  No rubs or gallops. Gastrointestinal system: Abdomen is nondistended, soft and nontender. No organomegaly or masses felt. Normal bowel sounds heard. Central nervous system: No focal deficit appreciated; exam is difficult given decreased interaction. Extremities: No cyanosis or clubbing.  No edema. Skin: No petechiae. Psychiatry: No agitation or restlessness; flat affect.   Data Reviewed: I have personally reviewed following labs and imaging studies  CBC: Recent Labs  Lab 04/05/21 0404 04/06/21 0702 04/08/21 0811 04/10/21 0419  WBC 20.8* 18.0* 38.8* 27.5*  NEUTROABS  --   --  36.8*  --   HGB 15.3 15.1 17.1* 13.5  HCT 46.5 44.9 50.1 40.3  MCV 93.9 94.1 92.9 93.3  PLT 74* 41* 38* 17*    Basic Metabolic Panel: Recent Labs  Lab 04/05/21 0404 04/08/21 0811 04/08/21 1803 04/10/21 0419 04/11/21 0511  NA 141  140  --  146*  --   K 4.4 3.8  --  3.4*  --   CL 107 106  --  114*  --   CO2 28 26  --  24  --   GLUCOSE 164* 140*  --  178*  --   BUN 31* 41*  --  36*  --   CREATININE 0.68 1.11  --  0.77 0.66  CALCIUM 8.9 9.2  --  8.8*  --   MG  --   --  2.3  --   --   PHOS  --   --  4.3  --   --     GFR: Estimated Creatinine Clearance: 64.7 mL/min (by C-G formula based  on SCr of 0.66 mg/dL).  Liver Function Tests: Recent Labs  Lab 04/08/21 0811  AST 25  ALT 36  ALKPHOS 59  BILITOT 1.6*  PROT 5.8*  ALBUMIN 3.2*    CBG: Recent Labs  Lab 04/06/21 1638 04/06/21 2114 04/07/21 0748 04/07/21 1124 04/07/21 1647  GLUCAP 135* 147* 146* 210* 256*     Recent Results (from the past 240 hour(s))  Urine Culture     Status: Abnormal   Collection Time: 04/08/21  7:40 AM   Specimen: In/Out Cath Urine  Result Value Ref Range Status   Specimen Description   Final    IN/OUT CATH URINE Performed at [facility], [address], [place], [place] [address]    Special Requests   Final    NONE Performed at [facility], [address], [place], [place] [address]    Culture 8,000 COLONIES/mL KLEBSIELLA PNEUMONIAE (A)  Final   Report Status 04/11/2021 FINAL  Final   Organism ID, Bacteria KLEBSIELLA PNEUMONIAE (A)  Final      Susceptibility   Klebsiella pneumoniae - MIC*    AMPICILLIN >=32 RESISTANT Resistant     CEFAZOLIN <=4 SENSITIVE Sensitive     CEFTRIAXONE <=0.25 SENSITIVE Sensitive     CIPROFLOXACIN <=0.25 SENSITIVE Sensitive     GENTAMICIN <=1 SENSITIVE Sensitive     IMIPENEM <=0.25 SENSITIVE Sensitive     NITROFURANTOIN 64 INTERMEDIATE Intermediate     TRIMETH/SULFA <=20 SENSITIVE Sensitive     AMPICILLIN/SULBACTAM 8 SENSITIVE Sensitive     * 8,000 COLONIES/mL KLEBSIELLA PNEUMONIAE  Blood Culture (routine x 2)     Status: None (Preliminary result)   Collection Time: 04/08/21  8:46 AM   Specimen: BLOOD  Result Value Ref Range Status   Specimen Description BLOOD RIGHT ANTECUBITAL  Final   Special Requests   Final    BOTTLES DRAWN AEROBIC AND ANAEROBIC Blood Culture adequate volume   Culture   Final    NO GROWTH 3 DAYS Performed at [facility], [address], [place], [place] [address]    Report Status PENDING  Incomplete  Culture, blood (Routine X 2) w Reflex to ID Panel     Status: None (Preliminary result)   Collection Time: 04/08/21  10:20 AM   Specimen: BLOOD  Result Value Ref Range Status   Specimen Description BLOOD LEFT ANTECUBITAL  Final   Special Requests   Final    BOTTLES DRAWN AEROBIC ONLY Blood Culture results may not be optimal due to an inadequate volume of blood received in culture bottles   Culture   Final    NO GROWTH 3 DAYS Performed at [facility] [address] [place], [place] [address]    Report Status PENDING  Incomplete  MRSA Next Gen by  PCR, Nasal     Status: None   Collection Time: 04/09/21 10:00 AM  Result Value Ref Range Status   MRSA by PCR Next Gen NOT DETECTED NOT DETECTED Final    Comment: (NOTE) The GeneXpert MRSA Assay (FDA approved for NASAL specimens only), is one component of a comprehensive MRSA colonization surveillance program. It is not intended to diagnose MRSA infection nor to guide or monitor treatment for MRSA infections. Test performance is not FDA approved in patients less than 53 years old. Performed at [facility], [address] [place], [place] [address]      Radiology Studies: No results found.   Scheduled Meds:  aspirin EC  81 mg Oral Daily   carvedilol  3.125 mg Oral BID   chlorhexidine  15 mL Mouth Rinse BID   fluticasone furoate-vilanterol  1 puff Inhalation Daily   mouth rinse  15 mL Mouth Rinse q12n4p   methylPREDNISolone (SOLU-MEDROL) injection  40 mg Intravenous Q24H   pantoprazole (PROTONIX) IV  40 mg Intravenous Q24H   Continuous Infusions:  ceFEPime (MAXIPIME) IV 2 g (04/11/21 1719)     LOS: 3 days    Time spent: 30 minutes   Barton Dubois, MD [facility]   To contact the attending provider between 7A-7P or the covering provider during after hours 7P-7A, please log into the web site [email or web address] and access using universal [place] password for that web site. If you do not have the password, please call the hospital operator.  04/11/2021, 5:42 PM

## 2021-04-11 NOTE — Evaluation (Signed)
Clinical/Bedside Swallow Evaluation Patient Details  Name: Manuel Le MRN: [id number] Date of Birth: 1926-11-16  Today's Date: 04/11/2021 Time: SLP Start Time (ACUTE ONLY): 50 SLP Stop Time (ACUTE ONLY): [id number] SLP Time Calculation (min) (ACUTE ONLY): 22 min  Past Medical History:  Past Medical History:  Diagnosis Date   Arthritis    OSTEO   Atrial fibrillation and flutter ([place])    Bladder cancer ([place])    Coronary artery disease    Dysrhythmia    Exertional angina ([place]) 07/08/2014   H/O urinary frequency    Hematuria    Hypertension    Iron deficiency anemia    Kidney stones    hx of    Mild obstructive sleep apnea    no cpap   S/P coronary artery stent placement 07/24/2014   Skin cancer    Weakness    Past Surgical History:  Past Surgical History:  Procedure Laterality Date   CARDIAC CATHETERIZATION     CARDIOVASCULAR STRESS TEST  12-15-2013  DR Kate Sable ([place])   MILD - MODERATE PERI-INFARCT ISCHEMIA  OF INFERIOR WALL, MID-INFEROSEPTAL WALL, MID-INFEROLATERAL WALL, & BASAL INFERIOR WALL/   NORMAL LVF /  EF 59%/  INTERMEDIATE RISK STUDY   COLONOSCOPY  07/04/2012   Procedure: COLONOSCOPY;  Surgeon: Rogene Houston, MD;  [facility];  Service: Endoscopy;  Laterality: N/A;  320   CORONARY STENT PLACEMENT  07/08/2014   PTCA of RCA   DR COOPER   CYSTOSCOPY W/ RETROGRADES Bilateral 12/31/2013   Procedure: CYSTOSCOPY WITH RETROGRADE PYELOGRAM;  Surgeon: Alexis Frock, MD;  Location: [facility] ORS;  Service: Urology;  Laterality: Bilateral;   CYSTOSCOPY W/ RETROGRADES Bilateral 01/20/2015   Procedure: CYSTOSCOPY WITH RETROGRADE PYELOGRAM;  Surgeon: Alexis Frock, MD;  Location: [facility] ORS;  Service: Urology;  Laterality: Bilateral;   ESOPHAGOGASTRODUODENOSCOPY  07/04/2012   Procedure: ESOPHAGOGASTRODUODENOSCOPY (EGD);  Surgeon: Rogene Houston, MD;  [facility];  Service: Endoscopy;  Laterality: N/A;   EYE SURGERY     past cataract surgery  bilat   HERNIA REPAIR Right    LEFT HEART CATHETERIZATION WITH CORONARY ANGIOGRAM N/A 07/08/2014   Procedure: LEFT HEART CATHETERIZATION WITH CORONARY ANGIOGRAM;  Surgeon: Blane Ohara, MD;  Location: [facility] CATH LAB;  Service: Cardiovascular;  Laterality: N/A;   PERCUTANEOUS CORONARY STENT INTERVENTION (PCI-S)  07/08/2014   Procedure: PERCUTANEOUS CORONARY STENT INTERVENTION (PCI-S);  Surgeon: Blane Ohara, MD;  Location: [facility] CATH LAB;  Service: Cardiovascular;;   TOTAL KNEE ARTHROPLASTY Bilateral 2000  &  2003?   TRANSTHORACIC ECHOCARDIOGRAM  09-11-2013   MODERATE LVH/  EF 55-60%/  MILD MR & TR/  MILD TO MODERATE CALCIFIED AV WITHOUT STENOSIS/  MILD LAE/   MODERATE PR   TRANSURETHRAL RESECTION OF BLADDER TUMOR N/A 01/20/2015   Procedure: TRANSURETHRAL RESECTION OF BLADDER TUMOR (TURBT);  Surgeon: Alexis Frock, MD;  Location: [facility];  Service: Urology;  Laterality: N/A;   TRANSURETHRAL RESECTION OF BLADDER TUMOR WITH GYRUS (TURBT-GYRUS) N/A 12/31/2013   Procedure: TRANSURETHRAL RESECTION OF BLADDER TUMOR WITH GYRUS (TURBT-GYRUS);  Surgeon: Alexis Frock, MD;  Location: [facility];  Service: Urology;  Laterality: N/A;   ZENKER'S DIVERTICULECTOMY N/A 09/09/2015   Procedure: ENDOSCOPIC ZENKER'S DIVERTICULECTOMY;  Surgeon: Izora Gala, MD;  [facility];  Service: ENT;  Laterality: N/A;   ZENKER'S DIVERTICULECTOMY ENDOSCOPIC  09/09/2015   HPI:  Manuel Le is a 87 y.o. male with medical history significant of chronic paroxysmal atrial fibrillation, coronary artery disease, hypertension  and recent hospitalizations due to COVID-19 pneumonia; who came to the emergency department from the skilled nursing facility secondary to worsening respiratory status and hypoxia.  Patient was just discharged on 04/07/2021 using 5 L nasal cannula supplementation to nursing home for rehabilitation and further care.  On the day of admission despite using 6 L nasal cannula supplementation patient's saturation was in  the mid 70s.  There was no fever appreciated.  Patient was tachypneic and denies chest pain.  No nausea, no vomiting, no hematuria, no dysuria, no melena, no hematochezia, no focal deficits. BSE requested.   Assessment / Plan / Recommendation Clinical Impression  Limited clinical swallow evaluation completed due to Pt lethargy. Pt was recently seen for BSE during previous admission with COVID and is now back with failure to thrive. Pt only occasionally responded verbally with "yes" and "no". Oral care completed, however Pt clenched teeth at times, so difficult to clean tongue. Pt with congested cough before and after po trials. He was assessed with limited amounts of ice chips, thin water via cup, and only two bites of puree. Pt is very lethargic and deconditioned, placing him at risk for aspiration. He has known esophageal dysphagia. Depending on goals of care, comfort feeds may be most appropriate at this time. He is not alert enough for MBSS and staff reports limited arousal all day. Family reportedly does not want feeding tube. He is unlikely to take po medications at this time, however can consider offering crushed in puree if he is alert along with comfort feeds of puree, thin liquids, and ice chips. If family does not want comfort feeds at this time, allow ice chips and small sips of water after oral care and SLP will check back tomorrow. Above d/w nursing and palliative care team. SLP Visit Diagnosis: Dysphagia, unspecified (R13.10)    Aspiration Risk  Moderate aspiration risk    Diet Recommendation NPO;Dysphagia 1 (Puree);Thin liquid (vs puree/thin comfort feeds)   Liquid Administration via: Cup Medication Administration: Crushed with puree Supervision: Full supervision/cueing for compensatory strategies Compensations: Slow rate;Small sips/bites Postural Changes: Seated upright at 90 degrees;Remain upright for at least 30 minutes after po intake    Other  Recommendations Oral Care  Recommendations: Staff/trained caregiver to provide oral care;Oral care prior to ice chip/H20 Other Recommendations: Clarify dietary restrictions   Follow up Recommendations Skilled Nursing facility;24 hour supervision/assistance      Frequency and Duration min 2x/week  1 week       Prognosis Prognosis for Safe Diet Advancement: Guarded Barriers to Reach Goals:  (lethargy)      Swallow Study   General Date of Onset: 04/08/21 HPI: Manuel Le is a 87 y.o. male with medical history significant of chronic paroxysmal atrial fibrillation, coronary artery disease, hypertension and recent hospitalizations due to COVID-19 pneumonia; who came to the emergency department from the skilled nursing facility secondary to worsening respiratory status and hypoxia.  Patient was just discharged on 04/07/2021 using 5 L nasal cannula supplementation to nursing home for rehabilitation and further care.  On the day of admission despite using 6 L nasal cannula supplementation patient's saturation was in the mid 70s.  There was no fever appreciated.  Patient was tachypneic and denies chest pain.  No nausea, no vomiting, no hematuria, no dysuria, no melena, no hematochezia, no focal deficits. BSE requested. Type of Study: Bedside Swallow Evaluation Previous Swallow Assessment: BSE 04/01/21 reg/thin, BSE 2019 - hx of esophageal dysphagia, MBS 2017 Diet Prior to this Study: NPO  Temperature Spikes Noted: No Respiratory Status: Nasal cannula History of Recent Intubation: No Behavior/Cognition: Lethargic/Drowsy Oral Cavity Assessment: Dried secretions Oral Care Completed by SLP: Yes Oral Cavity - Dentition: Adequate natural dentition Vision: Impaired for self-feeding Self-Feeding Abilities: Total assist Patient Positioning: Upright in bed Baseline Vocal Quality: Normal Volitional Cough: Congested Volitional Swallow: Unable to elicit    Oral/Motor/Sensory Function Overall Oral Motor/Sensory Function:   (difficult to assess due to Pt lethargy)   Ice Chips Ice chips: Impaired Presentation: Spoon Oral Phase Impairments: Reduced labial seal;Reduced lingual movement/coordination Oral Phase Functional Implications: Oral holding Pharyngeal Phase Impairments: Suspected delayed Swallow;Cough - Delayed   Thin Liquid Thin Liquid: Impaired Presentation: Cup Oral Phase Impairments: Reduced labial seal;Reduced lingual movement/coordination;Poor awareness of bolus Pharyngeal  Phase Impairments: Suspected delayed Swallow;Cough - Delayed    Nectar Thick Nectar Thick Liquid: Impaired Presentation: Cup Oral Phase Impairments: Reduced labial seal Pharyngeal Phase Impairments: Cough - Delayed   Honey Thick Honey Thick Liquid: Not tested   Puree Puree: Within functional limits Presentation: Spoon (limited intake due to Pt lethargy)   Solid     Solid: Not tested     Thank you,  Genene Churn, [place] 04/11/2021,4:03 PM

## 2021-04-12 MED ORDER — HALOPERIDOL LACTATE 2 MG/ML PO CONC
0.5000 mg | ORAL | Status: DC | PRN
  Filled 2021-04-12: qty 0.3

## 2021-04-12 MED ORDER — ONDANSETRON HCL 4 MG/2ML IJ SOLN: 4.0000 mg | Freq: Four times a day (QID) | INTRAMUSCULAR | Status: DC | PRN

## 2021-04-12 MED ORDER — GLYCOPYRROLATE 0.2 MG/ML IJ SOLN
0.2000 mg | INTRAMUSCULAR | Status: DC | PRN
  Filled 2021-04-12: qty 1

## 2021-04-12 MED ORDER — GLYCOPYRROLATE 0.2 MG/ML IJ SOLN
0.2000 mg | INTRAMUSCULAR | Status: DC | PRN
  Administered 2021-04-14: 0.2 mg via INTRAVENOUS

## 2021-04-12 MED ORDER — MORPHINE SULFATE (PF) 2 MG/ML IV SOLN: 2.0000 mg | INTRAVENOUS | Status: DC | PRN

## 2021-04-12 MED ORDER — BIOTENE DRY MOUTH MT LIQD: 15.0000 mL | OROMUCOSAL | Status: DC | PRN

## 2021-04-12 MED ORDER — ONDANSETRON 4 MG PO TBDP: 4.0000 mg | ORAL_TABLET | Freq: Four times a day (QID) | ORAL | Status: DC | PRN

## 2021-04-12 MED ORDER — POLYVINYL ALCOHOL 1.4 % OP SOLN: 1.0000 [drp] | Freq: Four times a day (QID) | OPHTHALMIC | Status: DC | PRN

## 2021-04-12 MED ORDER — GLYCOPYRROLATE 1 MG PO TABS: 1.0000 mg | ORAL_TABLET | ORAL | Status: DC | PRN

## 2021-04-12 MED ORDER — HALOPERIDOL LACTATE 5 MG/ML IJ SOLN: 0.5000 mg | INTRAMUSCULAR | Status: DC | PRN

## 2021-04-12 MED ORDER — HALOPERIDOL 0.5 MG PO TABS: 0.5000 mg | ORAL_TABLET | ORAL | Status: DC | PRN

## 2021-04-12 NOTE — Progress Notes (Signed)
Manufacturing engineer [facility]) Hospital Liaison note.    Received request from [place] for family interest in [facility]. Spoke with family to confirm interest and answer questions. [facility] is unable to offer a room today. Hospital Liaison will follow up tomorrow or sooner if a room becomes available and eligibility is confirmed.   Please do not hesitate to call with questions.    Thank you,   Farrel Gordon, RN, [facility] Liaison   [phone]

## 2021-04-12 NOTE — TOC Initial Note (Signed)
Transition of Care [facility]) - Initial/Assessment Note    Patient Details  Name: Manuel Le MRN: [id number] Date of Birth: October 27, 1926  [facility] CM/SW Contact:    Shade Flood, LCSW Phone Number: 04/12/2021, 12:42 PM  Clinical Narrative:                  Per Palliative APNP and MD, pt family requesting referral to [facility]. Spoke with pt's nephew/POA who is agreeable to referral to other hospice residence if [facility].   Referrals made to [facility] and to [facility] Neither agency has bed available at this time. Pt is on the waiting list at each facility. Pt's nephew aware.  TOC will follow and assist with plan for transfer to [place] when bed available.  Expected Discharge Plan: [place] Barriers to Discharge: Hospice Bed not available   Patient Goals and CMS Choice Patient states their goals for this hospitalization and ongoing recovery are:: hospice CMS Medicare.gov Compare Post Acute Care list provided to:: Patient Represenative (must comment) Choice offered to / list presented to : [place] / Guardian  Expected Discharge Plan and Services Expected Discharge Plan: [place]     Post Acute Care Choice: Hospice                                        Prior Living Arrangements/Services                       Activities of Daily Living      Permission Sought/Granted                  Emotional Assessment              Admission diagnosis:  Hypoxia [R09.02] Acute on chronic respiratory failure with hypoxia (HCC) [J96.21] Patient Active Problem List   Diagnosis Date Noted   Acute respiratory disease due to COVID-19 virus 03/26/2021   Acute on chronic respiratory failure with hypoxia ([place]) 03/26/2021   Pneumonia due to COVID-19 virus 03/18/2021   Acute respiratory failure due to COVID-19 ([place]) 03/18/2021   Chronic diastolic CHF (congestive heart failure) ([place])  03/18/2021   Generalized weakness 03/18/2021   Truncal ataxia 10/20/2020   Dizzy 10/20/2020   TIA (transient ischemic attack) 10/20/2020   Acute respiratory failure with hypoxia (HCC)    Influenza with pneumonia    Palliative care encounter    Sepsis ([place]) 09/25/2017   Fever 09/25/2017   UTI (urinary tract infection) 09/25/2017   Abnormal liver function 09/25/2017   Tachycardia 09/25/2017   Influenza A    Pressure ulcer 09/14/2015   Zenker's diverticulum 09/09/2015   Bladder cancer ([place]) 07/14/2015   CAD (coronary artery disease) 07/24/2014   S/P coronary artery stent placement (RCA bare metal stent on 07/08/14) 07/24/2014   Exertional angina ([place]) 07/08/2014   Encounter for therapeutic drug monitoring 09/12/2013   Atrial fibrillation ([place]) 09/08/2013   HTN (hypertension) 09/08/2013   Fatigue 09/08/2013   Anemia 06/26/2012   PCP:  Caprice Renshaw, MD Pharmacy:   [facility] [place] [address] [place] [address] Phone: [phone] Fax: [phone]     Social Determinants of Health (SDOH) Interventions    Readmission Risk Interventions Readmission Risk Prevention Plan 03/21/2021  Transportation Screening Complete  PCP or Specialist Appt  within 3-5 Days Complete  HRI or Home Care Consult Complete  Social Work Consult for [place] Planning/Counseling Complete  Palliative Care Screening Not Applicable  Medication Review Press photographer) Complete  Some recent data might be hidden

## 2021-04-12 NOTE — Progress Notes (Signed)
Palliative: Mr. Wiesner is lying quietly in bed.  He appears acutely/chronically ill and frail.  He does not interact with me in any meaningful way, will not open his eyes.  He is unable to make his basic needs known.  There is no family at bedside at this time.  Call to nephew/HC POA, [name].  We talked about speech therapy consult yesterday and Mr. Denaro desire for no artificial feeding/nutrition.  [employer] consultant and dignity at end-of-life, residential hospice.  Provider choice offered, he chooses hospice of [facility] in [place], [place].  We talk in detail about what is and is not provided with comfort care and residential hospice including, but not limited to, no further IV fluids, no antibiotics, only medications focused on comfort and dignity.  End-of-life order set implemented, orders adjusted.  [place] form signed on chart.  Conference with attending, bedside nursing staff, transition of care team related to patient condition, needs, goals of care, hospice referral.  Plan: Comfort and dignity at end-of-life, requesting residential hospice with [employer], [place]. Prognosis: Less than 2 weeks, anticipate days.  64 minutes Quinn Axe, NP Palliative medicine team Team phone [phone] Greater than 50% of this time was spent counseling and coordinating care related to the above assessment and plan.

## 2021-04-12 NOTE — Progress Notes (Signed)
PROGRESS NOTE    Manuel Le  [id number] DOB: [birth year] DOA: 04/08/2021 PCP: Caprice Renshaw, MD    Chief Complaint  Patient presents with   Respiratory Distress    Brief admission narrative:  Manuel Le is a 87 y.o. male with medical history significant of chronic paroxysmal atrial fibrillation, coronary artery disease, hypertension and recent hospitalizations due to COVID-19 pneumonia; who came to the emergency department from the skilled nursing facility secondary to worsening respiratory status and hypoxia.  Patient was just discharged on 04/07/2021 using 5 L nasal cannula supplementation to nursing home for rehabilitation and further care.  On the day of admission despite using 6 L nasal cannula supplementation patient's saturation was in the mid 70s.  There was no fever appreciated.  Patient was tachypneic and denies chest pain.  No nausea, no vomiting, no hematuria, no dysuria, no melena, no hematochezia, no focal deficits.   As mentioned above recent history of COVID infection; patient is nonvaccinated.  During my evaluation patient is on 15 L high flow nasal cannula supplementation to keep O2 sats above 90%.   ED Course: Chest x-ray/CT angiogram of the chest demonstrating worsening multifocal pneumonia, elevated WBCs, elevated BMP, elevated troponin and elevated WBCs.  Cultures taken, IV broad-spectrum antibiotics initiated.  Gentle bolus of IV fluids provided.  Cardiology service curbside with recommendations to check 2D echo for completeness and cycle cardiac enzymes.  Not a candidate for interventions.  TRH consulted to place patient in the hospital for further evaluation and management.     Assessment & Plan: 1-severe sepsis secondary to pneumonia -Acute on chronic respiratory failure with hypoxia and recent hospitalization for COVID as part of increased risk for decompensation. -CT angiogram demonstrating what appears to be new multifocal infiltrates suggesting healthcare  associated pneumonia versus aspiration. -Patient with elevated WBCs, tachypnea, worsening hypoxia and findings on CT chest to suggest sepsis. -Lactic acid was also elevated; which has trended down appropriately with judicious hydration.  Further labs will be done. -No fever. -Dysphagia type I with thin liquids as part of comfort feeding. -MRSA PCR negative, vancomycin discontinued -Antibiotics has been now discontinued; plan is to transition to full comfort care and symptomatic management. -Residential hospice placement requested by family. -High risk for decompensation and overall poor prognosis; family has been updated and in agreement comfort management.  2-elevated troponin -No chest pain -No acute ischemic changes on EKG or telemetry -2D echo reassuring -Most likely demand ischemia. -We will focus on comfort care and symptomatic management only.  3-chronic diastolic heart failure -Appears to be compensated -will transition to comfort care only. -all other meds outside symptomatic management discontinued.   4-essential hypertension/paroxysmal chronic atrial fibrillation -Blood pressure and heart rate currently stable -will discontinue telemetry and focus on comfort care   5-GERD/GI prophylaxis -will focus on comfort care only.  6-recent COVID-pneumonia -Patient has completed time for isolation -Continue contact precautions -Will continue steroids tapering and focus on weaning of oxygen as tolerated.  7-dysphagia -With concern for high risk aspiration -Seen by speech therapy with recommendation for dysphagia 1 with thin liquids; for comfort feeding  -Continue oral care twice a day.  8-DNR/DNI -Confirm and further discuss with healthcare power of attorney -Overall poor prognosis -Patient with significant risk factors and comorbidities along with recent active decline with affectation into his lungs parenchyma from COVID infection.  Will not be a surprise if patient's  further rapidly decline and ended dying during this hospitalization. -Wishes will be granted and will focus on  treat was treatable. -Family expresses no intention for artificial nutrition. -After further discussion with family members and in this presents of improvement by Manuel Le decision has been made to transition to full comfort care and proceed with residential hospice placement.    DVT prophylaxis: SCDs  CODE STATUS: DNR/DNI Family Communication: Patient's nephew has been updated over the phone.  04/09/2021 Disposition:   Status is: Inpatient  Remains inpatient appropriate because:IV treatments appropriate due to intensity of illness or inability to take PO  Dispo: The patient is from: SNF              Anticipated d/c is to: Hospice home.              Patient currently patient is medically stable for discharge.   Difficult to place patient No    Consultants:  Cardiology service curbside.  Procedures: See below for x-ray reports. 2D echo:  1. Left ventricular ejection fraction, by estimation, is 65 to 70%. The  left ventricle has normal function. The left ventricle has no regional  wall motion abnormalities. There is moderate left ventricular hypertrophy.  Left ventricular diastolic  parameters are indeterminate.   2. Right ventricular systolic function is normal. The right ventricular  size is normal. There is normal pulmonary artery systolic pressure. The  estimated right ventricular systolic pressure is [id number] mmHg.   3. The mitral valve is degenerative. Trivial mitral valve regurgitation.  Moderate mitral annular calcification.   4. The aortic valve is tricuspid. There is mild calcification of the  aortic valve. Aortic valve regurgitation is not visualized. Mild, low  gradient aortic valve stenosis. Aortic valve area, by VTI measures 1.64  cm. Aortic valve mean gradient measures  6.0 mmHg. Dimentionless index 0.43.   5. The inferior vena cava is normal in size  with greater than 50%  respiratory variability, suggesting right atrial pressure of 3 mmHg.   Antimicrobials:  Vancomycin and Cefepime at time of admission.   Subjective: No eating, not drinking, intermittent coughing spells (nonproductive) appreciated during examination.  Patient is afebrile.  8 liter nasal supplementation in place.  Objective: Vitals:   04/11/21 2143 04/11/21 2246 04/12/21 0411 04/12/21 0716  BP: (!) 146/126 (!) 177/99 (!) 136/93   Pulse: 89 85 99   Resp: '17 20 20   '$ Temp: 98.6 F (37 C)  99.1 F (37.3 C)   TempSrc: Oral  Oral   SpO2: 94% 92% 93% 90%  Weight:        Intake/Output Summary (Last 24 hours) at 04/12/2021 1321 Last data filed at 04/12/2021 0900 Gross per 24 hour  Intake 183.87 ml  Output 650 ml  Net -466.13 ml   Filed Weights   04/10/21 0430  Weight: 88.7 kg    Examination: General exam: Patient was able to answer yes or no to the question if his name was Manuel Le; was able to recognize the voice of his sister.  Otherwise very lethargic, no eating, not drinking and minimally interactive. Respiratory system: Positive rhonchi bilaterally; no using accessory muscle.  Using 8 L nasal cannula supplementation. Cardiovascular system: Rate controlled, no rubs, no gallops, no JVD. Gastrointestinal system: Abdomen is nondistended, soft and nontender. No organomegaly or masses felt. Normal bowel sounds heard. Central nervous system: No focal neurological deficits.  But exam difficult to interpret given minimal interaction. Extremities: No C/C/E, +pedal pulses Skin: No petechiae. Psychiatry: Judgment and insight impaired; no agitation or restlessness appreciated.   Data Reviewed: I have personally reviewed following  labs and imaging studies  CBC: Recent Labs  Lab 04/06/21 0702 04/08/21 0811 04/10/21 0419  WBC 18.0* 38.8* 27.5*  NEUTROABS  --  36.8*  --   HGB 15.1 17.1* 13.5  HCT 44.9 50.1 40.3  MCV 94.1 92.9 93.3  PLT 41* 38* 17*     Basic Metabolic Panel: Recent Labs  Lab 04/08/21 0811 04/08/21 1803 04/10/21 0419 04/11/21 0511  NA 140  --  146*  --   K 3.8  --  3.4*  --   CL 106  --  114*  --   CO2 26  --  24  --   GLUCOSE 140*  --  178*  --   BUN 41*  --  36*  --   CREATININE 1.11  --  0.77 0.66  CALCIUM 9.2  --  8.8*  --   MG  --  2.3  --   --   PHOS  --  4.3  --   --     GFR: Estimated Creatinine Clearance: 64.7 mL/min (by C-G formula based on SCr of 0.66 mg/dL).  Liver Function Tests: Recent Labs  Lab 04/08/21 0811  AST 25  ALT 36  ALKPHOS 59  BILITOT 1.6*  PROT 5.8*  ALBUMIN 3.2*    CBG: Recent Labs  Lab 04/06/21 1638 04/06/21 2114 04/07/21 0748 04/07/21 1124 04/07/21 1647  GLUCAP 135* 147* 146* 210* 256*     Recent Results (from the past 240 hour(s))  Urine Culture     Status: Abnormal   Collection Time: 04/08/21  7:40 AM   Specimen: In/Out Cath Urine  Result Value Ref Range Status   Specimen Description   Final    IN/OUT CATH URINE Performed at [facility], [address], [place], [place] [address]    Special Requests   Final    NONE Performed at [facility], [address], [place], [place] [address]    Culture 8,000 COLONIES/mL KLEBSIELLA PNEUMONIAE (A)  Final   Report Status 04/11/2021 FINAL  Final   Organism ID, Bacteria KLEBSIELLA PNEUMONIAE (A)  Final      Susceptibility   Klebsiella pneumoniae - MIC*    AMPICILLIN >=32 RESISTANT Resistant     CEFAZOLIN <=4 SENSITIVE Sensitive     CEFTRIAXONE <=0.25 SENSITIVE Sensitive     CIPROFLOXACIN <=0.25 SENSITIVE Sensitive     GENTAMICIN <=1 SENSITIVE Sensitive     IMIPENEM <=0.25 SENSITIVE Sensitive     NITROFURANTOIN 64 INTERMEDIATE Intermediate     TRIMETH/SULFA <=20 SENSITIVE Sensitive     AMPICILLIN/SULBACTAM 8 SENSITIVE Sensitive     * 8,000 COLONIES/mL KLEBSIELLA PNEUMONIAE  Blood Culture (routine x 2)     Status: None (Preliminary result)   Collection Time: 04/08/21  8:46 AM   Specimen: BLOOD  Result  Value Ref Range Status   Specimen Description BLOOD RIGHT ANTECUBITAL  Final   Special Requests   Final    BOTTLES DRAWN AEROBIC AND ANAEROBIC Blood Culture adequate volume   Culture   Final    NO GROWTH 4 DAYS Performed at [facility], [address] [place], [place] [address]    Report Status PENDING  Incomplete  Culture, blood (Routine X 2) w Reflex to ID Panel     Status: None (Preliminary result)   Collection Time: 04/08/21 10:20 AM   Specimen: BLOOD  Result Value Ref Range Status   Specimen Description BLOOD LEFT ANTECUBITAL  Final   Special Requests   Final    BOTTLES DRAWN  AEROBIC ONLY Blood Culture results may not be optimal due to an inadequate volume of blood received in culture bottles   Culture   Final    NO GROWTH 4 DAYS Performed at [facility], [address] [place], [place] [address]    Report Status PENDING  Incomplete  MRSA Next Gen by PCR, Nasal     Status: None   Collection Time: 04/09/21 10:00 AM  Result Value Ref Range Status   MRSA by PCR Next Gen NOT DETECTED NOT DETECTED Final    Comment: (NOTE) The GeneXpert MRSA Assay (FDA approved for NASAL specimens only), is one component of a comprehensive MRSA colonization surveillance program. It is not intended to diagnose MRSA infection nor to guide or monitor treatment for MRSA infections. Test performance is not FDA approved in patients less than 54 years old. Performed at [facility], [address] [place], [place] [address]      Radiology Studies: No results found.   Scheduled Meds:  chlorhexidine  15 mL Mouth Rinse BID   fluticasone furoate-vilanterol  1 puff Inhalation Daily   mouth rinse  15 mL Mouth Rinse q12n4p   Continuous Infusions:     LOS: 4 days    Time spent: 30 minutes   Barton Dubois, MD [facility]   To contact the attending provider between 7A-7P or the covering provider during after hours 7P-7A, please log into the web site [email or web address] and access using  universal [place] password for that web site. If you do not have the password, please call the hospital operator.  04/12/2021, 1:21 PM

## 2021-04-13 LAB — CULTURE, BLOOD (ROUTINE X 2)
Culture: NO GROWTH
Culture: NO GROWTH
Special Requests: ADEQUATE

## 2021-04-13 NOTE — Progress Notes (Signed)
Manufacturing engineer [facility]) Hospital Liaison note.    Chart reviewed and eligibility confirmed for [facility].   1:00pm:  Visited pt at bedside and spoke with nephew [name] to address concerns about distance and ease of visiting for elderly relatives. Also discussed O2 demands and ability to titrate up to 25L as needed at [facility]. Discussed with TOC Juliann Pulse possibility of a transfer tomorrow and the possibility of beginning the consent process today to facilitate an early transfer  2:12pm: Received a call from [facility] saying that pt would be able to get a bed at [facility].  Bed released.  3:05pm: Received voicemail from [place] saying that pt would not be transferring to [place] and asking this liaison to follow up in the morning with TOC [place].    Please note that, while this pt has been deemed eligible for [facility], at this time [facility] is NOT holding him a bed.  [place] liaisons will reach out in the morning once bed availability at [facility] is known.  Care team updated via secure chat.  Terrilee Files updated at this time by phone, voices understanding of this complication, denies distress at his uncle remaining at [facility] at this time.  Thank you for the opportunity to participate in this patient's care.  Domenic Moras, BSN, RN [facility] Liaison (listed on [place] under Hospice[facility])    [phone] (24h on call)

## 2021-04-13 NOTE — Progress Notes (Signed)
PROGRESS NOTE    Manuel Le  [id number] DOB: [birth year] DOA: 04/08/2021 PCP: Caprice Renshaw, MD    Chief Complaint  Patient presents with   Respiratory Distress    Brief admission narrative:  Manuel Le is a 87 y.o. male with medical history significant of chronic paroxysmal atrial fibrillation, coronary artery disease, hypertension and recent hospitalizations due to COVID-19 pneumonia; who came to the emergency department from the skilled nursing facility secondary to worsening respiratory status and hypoxia.  Patient was just discharged on 04/07/2021 using 5 L nasal cannula supplementation to nursing home for rehabilitation and further care.  On the day of admission despite using 6 L nasal cannula supplementation patient's saturation was in the mid 70s.  There was no fever appreciated.  Patient was tachypneic and denies chest pain.  No nausea, no vomiting, no hematuria, no dysuria, no melena, no hematochezia, no focal deficits.   As mentioned above recent history of COVID infection; patient is nonvaccinated.  During my evaluation patient is on 15 L high flow nasal cannula supplementation to keep O2 sats above 90%.   ED Course: Chest x-ray/CT angiogram of the chest demonstrating worsening multifocal pneumonia, elevated WBCs, elevated BMP, elevated troponin and elevated WBCs.  Cultures taken, IV broad-spectrum antibiotics initiated.  Gentle bolus of IV fluids provided.  Cardiology service curbside with recommendations to check 2D echo for completeness and cycle cardiac enzymes.  Not a candidate for interventions.  TRH consulted to place patient in the hospital for further evaluation and management.     Assessment & Plan: 1-severe sepsis secondary to pneumonia -Acute on chronic respiratory failure with hypoxia and recent hospitalization for COVID as part of increased risk for decompensation. -CT angiogram demonstrating what appears to be new multifocal infiltrates suggesting healthcare  associated pneumonia versus aspiration. -Patient with elevated WBCs, tachypnea, worsening hypoxia and findings on CT chest to suggest sepsis. -Lactic acid was also elevated; which has trended down appropriately with judicious hydration.  Further labs will be done. -No fever. -Dysphagia type I with thin liquids as part of comfort feeding. -MRSA PCR negative, vancomycin discontinued -Antibiotics has been now discontinued; plan is to transition to full comfort care and symptomatic management. -Residential hospice placement requested by family. -High risk for decompensation and overall poor prognosis; family has been updated and in agreement comfort management.  2-elevated troponin -No chest pain -No acute ischemic changes on EKG or telemetry -2D echo reassuring -Most likely demand ischemia. comfort care and symptomatic management only.  3-chronic diastolic heart failure -Appears to be compensated Comfort care only.  4-essential hypertension/paroxysmal chronic atrial fibrillation -Blood pressure and heart rate currently stable  5-GERD/GI prophylaxis -will focus on comfort care only.  6-recent COVID-pneumonia -Patient has completed time for isolation  7-dysphagia -With concern for high risk aspiration -Seen by speech therapy with recommendation for dysphagia 1 with thin liquids; for comfort feeding  -Continue oral care twice a day.  8-DNR/DNI -Confirm and further discuss with healthcare power of attorney -Overall poor prognosis -Patient with significant risk factors and comorbidities along with recent active decline with COVID infection.   -Family expresses no intention for artificial nutrition. -After further discussion with family members  patient was transitioned to to full comfort care for with plan transfer to residential hospice placement.    DVT prophylaxis: SCDs  CODE STATUS: DNR/DNI Family Communication: Patient's nephew has been updated over the phone. Disposition:    Status is: Inpatient  Remains inpatient appropriate because:IV treatments appropriate due to intensity of illness  or inability to take PO  Dispo: The patient is from: SNF              Anticipated d/c is to: Hospice home.              Patient currently patient is medically stable for discharge to residential hospice house when bed becomes available   Difficult to place patient No    Consultants:  Cardiology service curbside.  Procedures: See below for x-ray reports. 2D echo:  1. Left ventricular ejection fraction, by estimation, is 65 to 70%. The  left ventricle has normal function. The left ventricle has no regional  wall motion abnormalities. There is moderate left ventricular hypertrophy.  Left ventricular diastolic  parameters are indeterminate.   2. Right ventricular systolic function is normal. The right ventricular  size is normal. There is normal pulmonary artery systolic pressure. The  estimated right ventricular systolic pressure is [id number] mmHg.   3. The mitral valve is degenerative. Trivial mitral valve regurgitation.  Moderate mitral annular calcification.   4. The aortic valve is tricuspid. There is mild calcification of the  aortic valve. Aortic valve regurgitation is not visualized. Mild, low  gradient aortic valve stenosis. Aortic valve area, by VTI measures 1.64  cm. Aortic valve mean gradient measures  6.0 mmHg. Dimentionless index 0.43.   5. The inferior vena cava is normal in size with greater than 50%  respiratory variability, suggesting right atrial pressure of 3 mmHg.   Antimicrobials:  Vancomycin and Cefepime at time of admission.   Subjective: -Quite sleepy, no significant oral intake, not really talking much  Objective: Vitals:   04/12/21 0716 04/12/21 1334 04/12/21 2114 04/13/21 1503  BP:  131/77 (!) 149/107 118/68  Pulse:  94 96 98  Resp:  '18 16 20  '$ Temp:  98.8 F (37.1 C) 97.8 F (36.6 C) 98 F (36.7 C)  TempSrc:  Oral Oral Oral   SpO2: 90% 94% 92% 92%  Weight:        Intake/Output Summary (Last 24 hours) at 04/13/2021 1759 Last data filed at 04/13/2021 0546 Gross per 24 hour  Intake 0 ml  Output 700 ml  Net -700 ml   Filed Weights   04/10/21 0430  Weight: 88.7 kg    Examination: Physical Exam Gen:-Sleepy, chronically ill-appearing HEENT:- [place].AT, No sclera icterus Nose- [place] 8 L/min Neck-Supple Neck,No JVD,.  Lungs-diminished air  movement, scattered wheezes/rhonchi bilaterally  CV- S1, S2 normal, RRR Abd-  +ve B.Sounds, Abd Soft, No tenderness,    Extremity/Skin:- No  edema,   good pulses Psych-affect is flat, oriented x3 Neuro-Generalized weakness, no new focal deficits, no tremors   Data Reviewed: I have personally reviewed following labs and imaging studies  CBC: Recent Labs  Lab 04/08/21 0811 04/10/21 0419  WBC 38.8* 27.5*  NEUTROABS 36.8*  --   HGB 17.1* 13.5  HCT 50.1 40.3  MCV 92.9 93.3  PLT 38* 17*    Basic Metabolic Panel: Recent Labs  Lab 04/08/21 0811 04/08/21 1803 04/10/21 0419 04/11/21 0511  NA 140  --  146*  --   K 3.8  --  3.4*  --   CL 106  --  114*  --   CO2 26  --  24  --   GLUCOSE 140*  --  178*  --   BUN 41*  --  36*  --   CREATININE 1.11  --  0.77 0.66  CALCIUM 9.2  --  8.8*  --  MG  --  2.3  --   --   PHOS  --  4.3  --   --     GFR: Estimated Creatinine Clearance: 64.7 mL/min (by C-G formula based on SCr of 0.66 mg/dL).  Liver Function Tests: Recent Labs  Lab 04/08/21 0811  AST 25  ALT 36  ALKPHOS 59  BILITOT 1.6*  PROT 5.8*  ALBUMIN 3.2*    CBG: Recent Labs  Lab 04/06/21 2114 04/07/21 0748 04/07/21 1124 04/07/21 1647  GLUCAP 147* 146* 210* 256*     Recent Results (from the past 240 hour(s))  Urine Culture     Status: Abnormal   Collection Time: 04/08/21  7:40 AM   Specimen: In/Out Cath Urine  Result Value Ref Range Status   Specimen Description   Final    IN/OUT CATH URINE Performed at [facility], [address]  [place], [place] [address]    Special Requests   Final    NONE Performed at [facility], [address] [place], [place] [address]    Culture 8,000 COLONIES/mL KLEBSIELLA PNEUMONIAE (A)  Final   Report Status 04/11/2021 FINAL  Final   Organism ID, Bacteria KLEBSIELLA PNEUMONIAE (A)  Final      Susceptibility   Klebsiella pneumoniae - MIC*    AMPICILLIN >=32 RESISTANT Resistant     CEFAZOLIN <=4 SENSITIVE Sensitive     CEFTRIAXONE <=0.25 SENSITIVE Sensitive     CIPROFLOXACIN <=0.25 SENSITIVE Sensitive     GENTAMICIN <=1 SENSITIVE Sensitive     IMIPENEM <=0.25 SENSITIVE Sensitive     NITROFURANTOIN 64 INTERMEDIATE Intermediate     TRIMETH/SULFA <=20 SENSITIVE Sensitive     AMPICILLIN/SULBACTAM 8 SENSITIVE Sensitive     * 8,000 COLONIES/mL KLEBSIELLA PNEUMONIAE  Blood Culture (routine x 2)     Status: None   Collection Time: 04/08/21  8:46 AM   Specimen: BLOOD  Result Value Ref Range Status   Specimen Description BLOOD RIGHT ANTECUBITAL  Final   Special Requests   Final    BOTTLES DRAWN AEROBIC AND ANAEROBIC Blood Culture adequate volume   Culture   Final    NO GROWTH 5 DAYS Performed at [facility], [address], [place], [place] [address]    Report Status 04/13/2021 FINAL  Final  Culture, blood (Routine X 2) w Reflex to ID Panel     Status: None   Collection Time: 04/08/21 10:20 AM   Specimen: BLOOD  Result Value Ref Range Status   Specimen Description BLOOD LEFT ANTECUBITAL  Final   Special Requests   Final    BOTTLES DRAWN AEROBIC ONLY Blood Culture results may not be optimal due to an inadequate volume of blood received in culture bottles   Culture   Final    NO GROWTH 5 DAYS Performed at [facility], [address] [place],  [address]    Report Status 04/13/2021 FINAL  Final  MRSA Next Gen by PCR, Nasal     Status: None   Collection Time: 04/09/21 10:00 AM  Result Value Ref Range Status   MRSA by PCR Next Gen NOT DETECTED NOT DETECTED Final    Comment:  (NOTE) The GeneXpert MRSA Assay (FDA approved for NASAL specimens only), is one component of a comprehensive MRSA colonization surveillance program. It is not intended to diagnose MRSA infection nor to guide or monitor treatment for MRSA infections. Test performance is not FDA approved in patients less than 62 years old. Performed at [facility], [place]  [address], [place], [place] [address]      Radiology Studies: No results found.   Scheduled Meds:  mouth rinse  15 mL Mouth Rinse q12n4p   Continuous Infusions:   LOS: 5 days    Roxan Hockey, MD [facility]   To contact the attending provider between 7A-7P or the covering provider during after hours 7P-7A, please log into the web site [email or web address] and access using universal [place] password for that web site. If you do not have the password, please call the hospital operator.  04/13/2021, 5:59 PM

## 2021-04-13 NOTE — Progress Notes (Signed)
Palliative:  Manuel Le is comfort care.  He will be transferred to residential hospice, [facility], 08/25.  Conference with attending and TOC team. No needs at this time.   No charge Manuel Axe, NP Palliative Medicine Team  Team phone [phone] Greater than 50% of this time was spent counseling and coordinating care related to the above assessment and plan.

## 2021-04-13 NOTE — Care Management Important Message (Signed)
Important Message  Patient Details  Name: Manuel Le MRN: [id number] Date of Birth: [birth year]   Medicare Important Message Given:  Yes     Tommy Medal 04/13/2021, 3:50 PM

## 2021-04-13 NOTE — TOC Progression Note (Signed)
Transition of Care [facility]) - Progression Note    Patient Details  Name: Manuel Le MRN: [id number] Date of Birth: 1926-10-14  Transition of Care [facility]) CM/SW Contact  Shade Flood, LCSW Phone Number: 04/13/2021, 3:20 PM  Clinical Narrative:     TOC following for Residential Hospice placement. Cassandra at [facility] states that they will not have bed for at least a couple of days. Spoke with Chrislyn from [facility] and they can accept pt tomorrow at [facility] in [place]. Spoke with pt's nephew/POA to discuss. Nephew has agreed for transfer to [facility].   Arranged EMS transport for late tomorrow AM. Updated MD.   Cristy Hilts will follow up in AM.  Expected Discharge Plan: [place] Barriers to Discharge: Hospice Bed not available  Expected Discharge Plan and Services Expected Discharge Plan: [place]     Post Acute Care Choice: Hospice                                         Social Determinants of Health (SDOH) Interventions    Readmission Risk Interventions Readmission Risk Prevention Plan 03/21/2021  Transportation Screening Complete  PCP or Specialist Appt within 3-5 Days Complete  HRI or Home Care Consult Complete  Social Work Consult for [place] Planning/Counseling Complete  Palliative Care Screening Not Applicable  Medication Review Press photographer) Complete  Some recent data might be hidden

## 2021-04-14 DIAGNOSIS — Z515 Encounter for palliative care: Secondary | ICD-10-CM

## 2021-04-14 MED ORDER — BIOTENE DRY MOUTH MT LIQD: 15.0000 mL | OROMUCOSAL | 0 refills | Status: AC | PRN

## 2021-04-14 MED ORDER — LORAZEPAM 2 MG/ML IJ SOLN: 1.0000 mg | Freq: Four times a day (QID) | INTRAMUSCULAR | Status: DC | PRN

## 2021-04-14 MED ORDER — GLYCOPYRROLATE 1 MG PO TABS: 1.0000 mg | ORAL_TABLET | ORAL | 0 refills | Status: AC | PRN

## 2021-04-14 MED ORDER — MORPHINE SULFATE (PF) 2 MG/ML IV SOLN
2.0000 mg | INTRAVENOUS | Status: DC | PRN
  Administered 2021-04-14: 2 mg via INTRAVENOUS
  Filled 2021-04-14: qty 1

## 2021-04-14 MED ORDER — ORAL CARE MOUTH RINSE: 15.0000 mL | Freq: Two times a day (BID) | OROMUCOSAL | 0 refills | Status: AC

## 2021-04-14 MED ORDER — ACETAMINOPHEN 650 MG RE SUPP: 650.0000 mg | Freq: Four times a day (QID) | RECTAL | 0 refills | Status: AC | PRN

## 2021-04-14 MED ORDER — ONDANSETRON 4 MG PO TBDP: 4.0000 mg | ORAL_TABLET | Freq: Four times a day (QID) | ORAL | 0 refills | Status: AC | PRN

## 2021-04-14 MED ORDER — POLYVINYL ALCOHOL 1.4 % OP SOLN: 1.0000 [drp] | Freq: Four times a day (QID) | OPHTHALMIC | 0 refills | Status: AC | PRN

## 2021-04-20 DIAGNOSIS — I2510 Atherosclerotic heart disease of native coronary artery without angina pectoris: Secondary | ICD-10-CM | POA: Diagnosis not present

## 2021-04-20 DIAGNOSIS — I10 Essential (primary) hypertension: Secondary | ICD-10-CM | POA: Diagnosis not present

## 2021-04-21 NOTE — Progress Notes (Signed)
Palliative: Manuel Le has been transitioned to comfort care.  He is lying quietly in bed, and appears comfortable.  He does not respond in any meaningful way to voice or touch.  His sister is at bedside.    Manuel Le has been accepted to [facility] for residential hospice, and will be transferred today. No symptom management needs at this time.  Plan: Comfort and dignity at end-of-life, residential hospice at [facility] to let nature take its course.  15 minutes Quinn Axe, NP Palliative medicine team Team phone [phone] Greater than 50% of this time was spent counseling and coordinating care related to the above assessment and plan.

## 2021-04-21 NOTE — Discharge Summary (Signed)
Manuel Le, is a 87 y.o. male  DOB 02/10/1927  MRN [id number].  Admission date:  04/08/2021  Admitting Physician  Barton Dubois, MD  Discharge Date:  2021-04-16   Primary MD  Caprice Renshaw, MD  Recommendations for primary care physician for things to follow:   -Discharge to residential hospice with full comfort care   Admission Diagnosis  Hypoxia [R09.02] Acute on chronic respiratory failure with hypoxia [facility]) [J96.21]   Discharge Diagnosis  Hypoxia [R09.02] Acute on chronic respiratory failure with hypoxia ([place]) [J96.21]    Principal Problem:   Acute on chronic respiratory failure with hypoxia ([place]) Active Problems:   Acute respiratory disease due to COVID-19 virus   End of life care   Hospice care patient   Palliative care encounter   Acute respiratory failure due to COVID-19 [facility])   Chronic diastolic CHF (congestive heart failure) ([place])      Past Medical History:  Diagnosis Date   Arthritis    OSTEO   Atrial fibrillation and flutter ([place])    Bladder cancer ([place])    Coronary artery disease    Dysrhythmia    Exertional angina ([place]) 07/08/2014   H/O urinary frequency    Hematuria    Hypertension    Iron deficiency anemia    Kidney stones    hx of    Mild obstructive sleep apnea    no cpap   S/P coronary artery stent placement 07/24/2014   Skin cancer    Weakness     Past Surgical History:  Procedure Laterality Date   CARDIAC CATHETERIZATION     CARDIOVASCULAR STRESS TEST  12-15-2013  DR Kate Sable ([place])   MILD - MODERATE PERI-INFARCT ISCHEMIA  OF INFERIOR WALL, MID-INFEROSEPTAL WALL, MID-INFEROLATERAL WALL, & BASAL INFERIOR WALL/   NORMAL LVF /  EF 59%/  INTERMEDIATE RISK STUDY   COLONOSCOPY  07/04/2012   Procedure: COLONOSCOPY;  Surgeon: Rogene Houston, MD;  [facility];  Service: Endoscopy;  Laterality: N/A;  320   CORONARY STENT PLACEMENT   07/08/2014   PTCA of RCA   DR COOPER   CYSTOSCOPY W/ RETROGRADES Bilateral 12/31/2013   Procedure: CYSTOSCOPY WITH RETROGRADE PYELOGRAM;  Surgeon: Alexis Frock, MD;  Location: [facility] ORS;  Service: Urology;  Laterality: Bilateral;   CYSTOSCOPY W/ RETROGRADES Bilateral 01/20/2015   Procedure: CYSTOSCOPY WITH RETROGRADE PYELOGRAM;  Surgeon: Alexis Frock, MD;  Location: [facility];  Service: Urology;  Laterality: Bilateral;   ESOPHAGOGASTRODUODENOSCOPY  07/04/2012   Procedure: ESOPHAGOGASTRODUODENOSCOPY (EGD);  Surgeon: Rogene Houston, MD;  Location[facility];  Service: Endoscopy;  Laterality: N/A;   EYE SURGERY     past cataract surgery bilat   HERNIA REPAIR Right    LEFT HEART CATHETERIZATION WITH CORONARY ANGIOGRAM N/A 07/08/2014   Procedure: LEFT HEART CATHETERIZATION WITH CORONARY ANGIOGRAM;  Surgeon: Blane Ohara, MD;  Location: [facility] CATH LAB;  Service: Cardiovascular;  Laterality: N/A;   PERCUTANEOUS CORONARY STENT INTERVENTION (PCI-S)  07/08/2014   Procedure: PERCUTANEOUS CORONARY STENT INTERVENTION (  PCI-S);  Surgeon: Blane Ohara, MD;  Location: [facility] CATH LAB;  Service: Cardiovascular;;   TOTAL KNEE ARTHROPLASTY Bilateral 2000  &  2003?   TRANSTHORACIC ECHOCARDIOGRAM  09-11-2013   MODERATE LVH/  EF 55-60%/  MILD MR & TR/  MILD TO MODERATE CALCIFIED AV WITHOUT STENOSIS/  MILD LAE/   MODERATE PR   TRANSURETHRAL RESECTION OF BLADDER TUMOR N/A 01/20/2015   Procedure: TRANSURETHRAL RESECTION OF BLADDER TUMOR (TURBT);  Surgeon: Alexis Frock, MD;  [facility];  Service: Urology;  Laterality: N/A;   TRANSURETHRAL RESECTION OF BLADDER TUMOR WITH GYRUS (TURBT-GYRUS) N/A 12/31/2013   Procedure: TRANSURETHRAL RESECTION OF BLADDER TUMOR WITH GYRUS (TURBT-GYRUS);  Surgeon: Alexis Frock, MD;  [facility];  Service: Urology;  Laterality: N/A;   ZENKER'S DIVERTICULECTOMY N/A 09/09/2015   Procedure: ENDOSCOPIC ZENKER'S DIVERTICULECTOMY;  Surgeon: Izora Gala, MD;  Location: [place];  Service:  ENT;  Laterality: N/A;   ZENKER'S DIVERTICULECTOMY ENDOSCOPIC  09/09/2015       HPI  from the history and physical done on the day of admission:    Chief Complaint: Worsening shortness of breath and hypoxia.   HPI: Manuel Le is a 87 y.o. male with medical history significant of chronic paroxysmal atrial fibrillation, coronary artery disease, hypertension and recent hospitalizations due to COVID-19 pneumonia; who came to the emergency department from the skilled nursing facility secondary to worsening respiratory status and hypoxia.  Patient was just discharged on 04/07/2021 using 5 L nasal cannula supplementation to nursing home for rehabilitation and further care.  On the day of admission despite using 6 L nasal cannula supplementation patient's saturation was in the mid 70s.  There was no fever appreciated.  Patient was tachypneic and denies chest pain.  No nausea, no vomiting, no hematuria, no dysuria, no melena, no hematochezia, no focal deficits.   As mentioned above recent history of COVID infection; patient is nonvaccinated.  During my evaluation patient is on 15 L high flow nasal cannula supplementation to keep O2 sats above 90%.   ED Course: Chest x-ray/CT angiogram of the chest demonstrating worsening multifocal pneumonia, elevated WBCs, elevated BMP, elevated troponin and elevated WBCs.  Cultures taken, IV broad-spectrum antibiotics initiated.  Gentle bolus of IV fluids provided.  Cardiology service curbside with recommendations to check 2D echo for completeness and cycle cardiac enzymes.  Not a candidate for interventions.  TRH consulted to place patient in the hospital for further evaluation and management.     Review of Systems: As per HPI otherwise all other systems reviewed and are negative.     Hospital Course:   Problem  End of Life Care  Hospice Care Patient  Acute Respiratory Disease Due to Covid-19 Virus  Acute On Chronic Respiratory Failure With Hypoxia ([facility])   Acute Respiratory Failure Due to Covid-19 ([facility])  Chronic Diastolic Chf (Congestive Heart Failure) ([facility])  Palliative Care Encounter        Assessment & Plan: 1-severe sepsis secondary to pneumonia -Acute on chronic respiratory failure with hypoxia and recent hospitalization for COVID as part of increased risk for decompensation. -CT angiogram demonstrating what appears to be new multifocal infiltrates suggesting healthcare associated pneumonia versus aspiration. -Patient with elevated WBCs, tachypnea, worsening hypoxia and findings on CT chest to suggest sepsis. -Lactic acid was also elevated; which has trended down appropriately with judicious hydration.  Further labs will be done. -No fever. -Dysphagia type I with thin liquids as part of comfort feeding. -MRSA PCR negative, vancomycin discontinued -Antibiotics has been  now discontinued;  09-May-2021 Pt has been transitioned to full comfort care  -Residential hospice placement requested by family. -Okay to transfer to residential hospice    2-elevated troponin -No chest pain -No acute ischemic changes on EKG or telemetry -2D echo reassuring -Most likely demand ischemia. comfort care and symptomatic management only.   3-chronic diastolic heart failure -Appears to be compensated Comfort care only.   4-essential hypertension/paroxysmal chronic atrial fibrillation -Continue comfort care measures   5-GERD/GI prophylaxis -will focus on comfort care only.   6-recent COVID-pneumonia -Patient has completed time for isolation   7-dysphagia -With concern for high risk aspiration -Seen by speech therapy with recommendation for dysphagia 1 with thin liquids; for comfort feeding  -Continue oral care twice a day.   8-DNR/DNI -Confirm and further discuss with healthcare power of attorney -Overall poor prognosis -Patient with significant risk factors and comorbidities along with recent active decline with COVID infection.   -Family  expresses no intention for artificial nutrition. -After further discussion with family members  patient was transitioned to to full comfort care for with plan transfer to residential hospice placement. -2021/05/09 Pt has been transitioned to full comfort care  -Residential hospice placement requested by family. -Okay to transfer to residential hospice    CODE STATUS: DNR/DNI Family Communication: Patient's nephew has been updated over the phone. Disposition:  Dispo: The patient is from: SNF              Anticipated d/c is to: Hospice home.              Patient currently patient is medically stable for discharge to residential hospice house   Discharge Condition: Overall prognosis is poor  Follow UP     Consults obtained -palliative care and hospice team  Diet and Activity recommendation:  As advised  Discharge Instructions    Discharge Instructions     Bed rest   Complete by: As directed    Call MD for:  difficulty breathing, headache or visual disturbances   Complete by: As directed    Call MD for:  severe uncontrolled pain   Complete by: As directed    Call MD for:  temperature >100.4   Complete by: As directed    Diet - low sodium heart healthy   Complete by: As directed    Discharge instructions   Complete by: As directed    -Discharge to residential hospice with full comfort care         Discharge Medications     Allergies as of 05/09/21   No Known Allergies      Medication List     STOP taking these medications    albuterol 108 (90 Base) MCG/ACT inhaler Commonly known as: VENTOLIN HFA   ascorbic acid 500 MG tablet Commonly known as: VITAMIN C   aspirin 81 MG tablet   carvedilol 3.125 MG tablet Commonly known as: COREG   Centrum Silver Adult 50+ Tabs   ferrous sulfate 325 (65 FE) MG EC tablet   finasteride 5 MG tablet Commonly known as: PROSCAR   Fish Oil 1000 MG Caps   furosemide 20 MG tablet Commonly known as: LASIX    guaiFENesin-dextromethorphan 100-10 MG/5ML syrup Commonly known as: ROBITUSSIN DM   insulin aspart 100 UNIT/ML injection Commonly known as: novoLOG   insulin detemir 100 UNIT/ML injection Commonly known as: LEVEMIR   melatonin 5 MG Tabs   methylPREDNISolone 4 MG Tbpk tablet Commonly known as: MEDROL DOSEPAK   nitroGLYCERIN 0.4 MG  SL tablet Commonly known as: NITROSTAT   Osteo Bi-Flex Adv Joint Shield Tabs   potassium chloride 10 MEQ tablet Commonly known as: KLOR-CON   sertraline 50 MG tablet Commonly known as: ZOLOFT   Vitamin D3 25 MCG (1000 UT) Caps       TAKE these medications    acetaminophen 650 MG suppository Commonly known as: TYLENOL Place 1 suppository (650 mg total) rectally every 6 (six) hours as needed for mild pain (or Fever >/= 101).   glycopyrrolate 1 MG tablet Commonly known as: ROBINUL Take 1 tablet (1 mg total) by mouth every 4 (four) hours as needed (excessive secretions).   mouth rinse Liqd solution 15 mLs by Mouth Rinse route 2 times daily at 12 noon and 4 pm.   antiseptic oral rinse Liqd Apply 15 mLs topically as needed for dry mouth.   ondansetron 4 MG disintegrating tablet Commonly known as: ZOFRAN-ODT Take 1 tablet (4 mg total) by mouth every 6 (six) hours as needed for nausea.   polyvinyl alcohol 1.4 % ophthalmic solution Commonly known as: LIQUIFILM TEARS Place 1 drop into both eyes 4 (four) times daily as needed for dry eyes.        Major procedures and Radiology Reports - PLEASE review detailed and final reports for all details, in brief -    CT Angio Chest Pulmonary Embolism (PE) W or WO Contrast  Result Date: 04/08/2021 CLINICAL DATA:  PE suspected. Decreased oxygen saturation. Shortness of breath. EXAM: CT ANGIOGRAPHY CHEST WITH CONTRAST TECHNIQUE: Multidetector CT imaging of the chest was performed using the standard protocol during bolus administration of intravenous contrast. Multiplanar CT image reconstructions and  MIPs were obtained to evaluate the vascular anatomy. CONTRAST:  182m OMNIPAQUE IOHEXOL 350 MG/ML SOLN COMPARISON:  03/26/21. FINDINGS: Cardiovascular: Exam detail is diminished by motion artifact. Within this limitation there is no signs of acute pulmonary embolus. Heart size normal. No pericardial effusion. Aortic atherosclerosis and multi vessel coronary artery calcifications noted. Mediastinum/Nodes: Subcarinal lymph node measures 1.6 cm, image 51/4. Likely reactive previously this measured the same. Normal appearance of the thyroid gland. The trachea appears patent and is midline. Normal appearance of the esophagus. Lungs/Pleura: On today's exam there is marked airspace consolidation involving the posterior right lower lobe, significantly increased from previous exam. Progressive subpleural airspace densities within the posterolateral left lower lobe are also noted. Significant interval increase in bilateral upper lobe ground-glass opacities. Upper Abdomen: No acute findings.  Aortic atherosclerosis. Musculoskeletal: Thoracic spondylosis. No acute or suspicious osseous abnormality. Review of the MIP images confirms the above findings. IMPRESSION: 1. Exam detail is diminished by motion artifact. Within this limitation there is no evidence for acute pulmonary embolus. 2. Marked interval increase in airspace consolidation involving the posterior right lower lobe with progressive subpleural airspace densities within the posterolateral left lower lobe. Significant interval increase in bilateral upper lobe ground-glass opacities. Imaging findings compatible with progressive multifocal infection. 3. Aortic atherosclerosis and multi vessel coronary artery calcifications. 4. Enlarged subcarinal lymph node is likely reactive in etiology. Aortic Atherosclerosis (ICD10-I70.0). Electronically Signed   By: TKerby MoorsM.D.   On: 04/08/2021 13:10   CT Angio Chest PE W and/or Wo Contrast  Result Date: 03/26/2021 CLINICAL  DATA:  PE suspected, recent COVID, home oxygen EXAM: CT ANGIOGRAPHY CHEST WITH CONTRAST TECHNIQUE: Multidetector CT imaging of the chest was performed using the standard protocol during bolus administration of intravenous contrast. Multiplanar CT image reconstructions and MIPs were obtained to evaluate the vascular anatomy. CONTRAST:  165m OMNIPAQUE IOHEXOL 350 MG/ML SOLN COMPARISON:  03/18/2021 FINDINGS: Cardiovascular: Satisfactory opacification of the pulmonary arteries to the segmental level. No evidence of pulmonary embolism. Cardiomegaly. Three-vessel coronary artery calcifications and/or stents. No pericardial effusion. Enlargement of the main pulmonary artery measuring up to 3.6 cm. Aortic atherosclerosis. Mediastinum/Nodes: No enlarged mediastinal, hilar, or axillary lymph nodes. Thyroid gland, trachea, and esophagus demonstrate no significant findings. Lungs/Pleura: Extensive peripheral ground-glass airspace opacity present bilaterally, predominantly in the dependent lungs, slightly worsened compared to prior examination. There is likely some degree of underlying scarring of the bilateral lung bases. Upper Abdomen: No acute abnormality. Musculoskeletal: No chest wall abnormality. No acute or significant osseous findings. Review of the MIP images confirms the above findings. IMPRESSION: 1. Negative examination for pulmonary embolism. 2. Extensive peripheral ground-glass airspace opacity present bilaterally, predominantly in the dependent lungs, slightly worsened compared to prior examination and in keeping with COVID-19 airspace disease. 3. Cardiomegaly and coronary artery disease. 4. Enlargement of the main pulmonary artery, as can be seen in pulmonary hypertension. Aortic Atherosclerosis (ICD10-I70.0). Electronically Signed   By: AEddie CandleM.D.   On: 03/26/2021 20:05   CT Angio Chest PE W and/or Wo Contrast  Result Date: 03/18/2021 CLINICAL DATA:  87year old male with shortness of breath, COVID  positive. EXAM: CT ANGIOGRAPHY CHEST WITH CONTRAST TECHNIQUE: Multidetector CT imaging of the chest was performed using the standard protocol during bolus administration of intravenous contrast. Multiplanar CT image reconstructions and MIPs were obtained to evaluate the vascular anatomy. CONTRAST:  100 mL Omnipaque 350, intravenous COMPARISON:  09/25/2017 FINDINGS: Cardiovascular: Satisfactory opacification of the pulmonary arteries to the segmental level. No evidence of pulmonary embolism. Normal heart size. No pericardial effusion. Coronary, aortic valvular, mitral annular, and aortic atherosclerotic calcifications are noted. Mediastinum/Nodes: No enlarged mediastinal, hilar, or axillary lymph nodes. Thyroid gland, trachea, and esophagus demonstrate no significant findings. Lungs/Pleura: Similar appearing bibasilar peribronchovascular thickening with associated dependent atelectasis and scattered consolidative opacities. No new consolidative opacities, significant pleural effusion, pneumothorax. Mild emphysematous changes are noted. Mild Upper Abdomen: The visualized upper abdomen is within normal limits. Musculoskeletal: No chest wall abnormality. No acute or significant osseous findings. Review of the MIP images confirms the above findings. IMPRESSION: Vascular: 1. No evidence of pulmonary embolism. 2. Coronary and aortic atherosclerosis (ICD10-I70.0). Non-Vascular: 1. Bibasilar bronchial wall thickening and associated atelectasis, similar to 2019 comparison, likely indicative of sequela of chronic aspiration. Superimposed pneumonia could appear similarly. 2.  Mild emphysema (ICD10-J43.9). DRuthann Cancer MD [facility] Electronically Signed   By: DRuthann CancerMD   On: 03/18/2021 13:44   DG Chest Port 1 View  Result Date: 04/08/2021 CLINICAL DATA:  Cough and shortness of breath. Recent COVID infection. EXAM: PORTABLE CHEST 1 VIEW COMPARISON:  Chest  x-ray dated April 05, 2021. FINDINGS: The heart size and mediastinal contours are within normal limits. Peripheral predominant interstitial and hazy airspace opacities in both lungs are similar to prior study. No pleural effusion or pneumothorax. No acute osseous abnormality. IMPRESSION: 1. Unchanged peripheral predominant interstitial and hazy airspace opacities in both lungs likely related to COVID infection. Electronically Signed   By: WTitus DubinM.D.   On: 04/08/2021 08:02   DG CHEST PORT 1 VIEW  Result Date: 04/05/2021 CLINICAL DATA:  Shortness of breath. EXAM: PORTABLE CHEST 1 VIEW COMPARISON:  03/26/2021 FINDINGS: Decreased lung volumes. Persistent bilateral upper and lower lung zone interstitial opacities. When compared with the previous exam there is been worsening aeration to the  left upper lobe. IMPRESSION: Persistent bilateral interstitial opacities with worsening aeration to the left upper lobe. Electronically Signed   By: Kerby Moors M.D.   On: 04/05/2021 10:37   DG Chest Port 1 View  Result Date: 03/26/2021 CLINICAL DATA:  Shortness of breath. EXAM: PORTABLE CHEST 1 VIEW COMPARISON:  03/18/2021. FINDINGS: Low lung volumes with mildly increased diffuse interstitial opacities. No visible pleural effusions or pneumothorax. Right lung apex is partially obscured by overlapping neck/face. Stable cardiomediastinal silhouette. Calcific atherosclerosis of the aorta. No acute osseous abnormality. IMPRESSION: Low lung volumes with mildly increased diffuse interstitial opacities, potentially mild interstitial edema or atypical infection. Dedicated PA and lateral radiographs may be helpful if the patient is able. Electronically Signed   By: Margaretha Sheffield MD   On: 03/26/2021 17:39   DG Chest Port 1 View  Result Date: 03/18/2021 CLINICAL DATA:  87 year old male with shortness of breath for 1 week. Positive COVID-19. EXAM: PORTABLE CHEST 1 VIEW COMPARISON:  Chest radiographs 07/27/2019 and  earlier. FINDINGS: Portable AP semi upright view at 1039 hours. Lower lung volumes. Indistinct increased lung base opacity greater on the left. Stable cardiac size and mediastinal contours. No pneumothorax or pulmonary edema. No definite pleural effusion. No other confluent pulmonary opacity. Visualized tracheal air column is within normal limits. Paucity of bowel gas in the upper abdomen. No acute osseous abnormality identified. IMPRESSION: Low lung volumes with nonspecific bibasilar opacity greater on the left, possibly atelectasis but left lung base pneumonia not excluded. PA and lateral views would be helpful when feasible. Electronically Signed   By: Genevie Ann M.D.   On: 03/18/2021 11:02   ECHOCARDIOGRAM COMPLETE  Result Date: 04/08/2021    ECHOCARDIOGRAM REPORT   Patient Name:   Manuel Le Date of Exam: 04/08/2021 Medical Rec #:  [id number]      Height:       72.0 in Accession #:    [id number]     Weight:       216.7 lb Date of Birth:  1927-01-12       BSA:          2.204 m Patient Age:    24 years       BP:           114/59 mmHg Patient Gender: M              HR:           76 bpm. Exam Location:  [name] Procedure: 2D Echo, Cardiac Doppler and Color Doppler Indications:    Elevated Troponin  History:        Patient has prior history of Echocardiogram examinations, most                 recent 09/26/2017. CHF, CAD, COPD, Arrythmias:Atrial Fibrillation,                 Signs/Symptoms:Resp. failure, Covid+; Risk Factors:Hypertension                 and Former Smoker.  Sonographer:    Dustin Flock RDCS Referring Phys: [phone] CARLOS MADERA  Sonographer Comments: Technically difficult study due to poor echo windows. Image acquisition challenging due to COPD. IMPRESSIONS  1. Left ventricular ejection fraction, by estimation, is 65 to 70%. The left ventricle has normal function. The left ventricle has no regional wall motion abnormalities. There is moderate left ventricular hypertrophy. Left ventricular  diastolic parameters are indeterminate.  2. Right ventricular systolic function is normal.  The right ventricular size is normal. There is normal pulmonary artery systolic pressure. The estimated right ventricular systolic pressure is [id number] mmHg.  3. The mitral valve is degenerative. Trivial mitral valve regurgitation. Moderate mitral annular calcification.  4. The aortic valve is tricuspid. There is mild calcification of the aortic valve. Aortic valve regurgitation is not visualized. Mild, low gradient aortic valve stenosis. Aortic valve area, by VTI measures 1.64 cm. Aortic valve mean gradient measures 6.0 mmHg. Dimentionless index 0.43.  5. The inferior vena cava is normal in size with greater than 50% respiratory variability, suggesting right atrial pressure of 3 mmHg. Comparison(s): No prior Echocardiogram. FINDINGS  Left Ventricle: Left ventricular ejection fraction, by estimation, is 65 to 70%. The left ventricle has normal function. The left ventricle has no regional wall motion abnormalities. The left ventricular internal cavity size was normal in size. There is  moderate left ventricular hypertrophy. Left ventricular diastolic parameters are indeterminate. Right Ventricle: The right ventricular size is normal. No increase in right ventricular wall thickness. Right ventricular systolic function is normal. There is normal pulmonary artery systolic pressure. The tricuspid regurgitant velocity is 2.41 m/s, and  with an assumed right atrial pressure of 3 mmHg, the estimated right ventricular systolic pressure is [id number] mmHg. Left Atrium: Left atrial size was normal in size. Right Atrium: Right atrial size was normal in size. Pericardium: There is no evidence of pericardial effusion. Presence of pericardial fat pad. Mitral Valve: The mitral valve is degenerative in appearance. Moderate mitral annular calcification. Trivial mitral valve regurgitation. Tricuspid Valve: The tricuspid valve is grossly normal. Tricuspid  valve regurgitation is trivial. Aortic Valve: The aortic valve is tricuspid. There is mild calcification of the aortic valve. There is mild aortic valve annular calcification. Aortic valve regurgitation is not visualized. Mild aortic stenosis is present. Aortic valve mean gradient measures 6.0 mmHg. Aortic valve peak gradient measures 9.2 mmHg. Aortic valve area, by VTI measures 1.64 cm. Pulmonic Valve: The pulmonic valve was grossly normal. Pulmonic valve regurgitation is mild. Aorta: The aortic root is normal in size and structure. Venous: The inferior vena cava is normal in size with greater than 50% respiratory variability, suggesting right atrial pressure of 3 mmHg. IAS/Shunts: No atrial level shunt detected by color flow Doppler.  LEFT VENTRICLE PLAX 2D LVIDd:         3.36 cm  Diastology LVIDs:         2.28 cm  LV e' medial:    4.68 cm/s LV PW:         1.42 cm  LV E/e' medial:  10.7 LV IVS:        1.52 cm  LV e' lateral:   7.83 cm/s LVOT diam:     2.20 cm  LV E/e' lateral: 6.4 LV SV:         42 LV SV Index:   19 LVOT Area:     3.80 cm  RIGHT VENTRICLE RV Basal diam:  3.22 cm RV S prime:     10.80 cm/s TAPSE (M-mode): 2.6 cm LEFT ATRIUM             Index       RIGHT ATRIUM           Index LA diam:        3.20 cm 1.45 cm/m  RA Area:     13.90 cm LA Vol (A2C):   50.2 ml 22.77 ml/m RA Volume:   36.20 ml  16.42 ml/m LA Vol (A4C):  40.8 ml 18.51 ml/m LA Biplane Vol: 48.5 ml 22.00 ml/m  AORTIC VALVE AV Area (Vmax):    2.00 cm AV Area (Vmean):   1.59 cm AV Area (VTI):     1.64 cm AV Vmax:           152.00 cm/s AV Vmean:          109.000 cm/s AV VTI:            0.255 m AV Peak Grad:      9.2 mmHg AV Mean Grad:      6.0 mmHg LVOT Vmax:         80.00 cm/s LVOT Vmean:        45.700 cm/s LVOT VTI:          0.110 m LVOT/AV VTI ratio: 0.43  AORTA Ao Root diam: 3.50 cm MITRAL VALVE                TRICUSPID VALVE MV Area (PHT): 3.63 cm     TR Peak grad:   23.2 mmHg MV Decel Time: 209 msec     TR Vmax:         241.00 cm/s MV E velocity: 50.00 cm/s MV A velocity: 113.00 cm/s  SHUNTS MV E/A ratio:  0.44         Systemic VTI:  0.11 m                             Systemic Diam: 2.20 cm Rozann Lesches MD Electronically signed by Rozann Lesches MD Signature Date/Time: 04/08/2021/1:03:40 PM    Final     Micro Results   Recent Results (from the past 240 hour(s))  Urine Culture     Status: Abnormal   Collection Time: 04/08/21  7:40 AM   Specimen: In/Out Cath Urine  Result Value Ref Range Status   Specimen Description   Final    IN/OUT CATH URINE Performed at [facility], [address] [place], [place] [address]    Special Requests   Final    NONE Performed at [facility], [address] [place], [place] [address]    Culture 8,000 COLONIES/mL KLEBSIELLA PNEUMONIAE (A)  Final   Report Status 04/11/2021 FINAL  Final   Organism ID, Bacteria KLEBSIELLA PNEUMONIAE (A)  Final      Susceptibility   Klebsiella pneumoniae - MIC*    AMPICILLIN >=32 RESISTANT Resistant     CEFAZOLIN <=4 SENSITIVE Sensitive     CEFTRIAXONE <=0.25 SENSITIVE Sensitive     CIPROFLOXACIN <=0.25 SENSITIVE Sensitive     GENTAMICIN <=1 SENSITIVE Sensitive     IMIPENEM <=0.25 SENSITIVE Sensitive     NITROFURANTOIN 64 INTERMEDIATE Intermediate     TRIMETH/SULFA <=20 SENSITIVE Sensitive     AMPICILLIN/SULBACTAM 8 SENSITIVE Sensitive     * 8,000 COLONIES/mL KLEBSIELLA PNEUMONIAE  Blood Culture (routine x 2)     Status: None   Collection Time: 04/08/21  8:46 AM   Specimen: BLOOD  Result Value Ref Range Status   Specimen Description BLOOD RIGHT ANTECUBITAL  Final   Special Requests   Final    BOTTLES DRAWN AEROBIC AND ANAEROBIC Blood Culture adequate volume   Culture   Final    NO GROWTH 5 DAYS Performed at [facility], [address], [place],  [address]    Report Status 04/13/2021 FINAL  Final  Culture, blood (Routine X 2) w Reflex to ID Panel  Status: None   Collection Time: 04/08/21 10:20 AM   Specimen: BLOOD   Result Value Ref Range Status   Specimen Description BLOOD LEFT ANTECUBITAL  Final   Special Requests   Final    BOTTLES DRAWN AEROBIC ONLY Blood Culture results may not be optimal due to an inadequate volume of blood received in culture bottles   Culture   Final    NO GROWTH 5 DAYS Performed at [facility], [address] [place], [place] [address]    Report Status 04/13/2021 FINAL  Final  MRSA Next Gen by PCR, Nasal     Status: None   Collection Time: 04/09/21 10:00 AM  Result Value Ref Range Status   MRSA by PCR Next Gen NOT DETECTED NOT DETECTED Final    Comment: (NOTE) The GeneXpert MRSA Assay (FDA approved for NASAL specimens only), is one component of a comprehensive MRSA colonization surveillance program. It is not intended to diagnose MRSA infection nor to guide or monitor treatment for MRSA infections. Test performance is not FDA approved in patients less than 15 years old. Performed at [facility], [address], [place], [place] [address]        Today   Subjective    Manuel Le today has no fevers 04-15-21 Pt has been transitioned to full comfort care  -Residential hospice placement requested by family. -Okay to transfer to residential hospice   -Appears comfortable   Patient has been seen and examined prior to discharge   Objective   Blood pressure 118/68, pulse 98, temperature 98 F (36.7 C), temperature source Oral, resp. rate 20, weight 88.7 kg, SpO2 92 %.   Intake/Output Summary (Last 24 hours) at Apr 15, 2021 1149 Last data filed at Apr 15, 2021 0700 Gross per 24 hour  Intake 0 ml  Output 500 ml  Net -500 ml    Exam Physical Exam Gen:-Sleepy, chronically ill-appearing, appears comfortable HEENT:- [place].AT, No sclera icterus Nose- [place] 8 L/min Neck-Supple Neck,No JVD,.  Lungs-diminished air  movement, scattered wheezes/rhonchi bilaterally  CV- S1, S2 normal, RRR Abd-  +ve B.Sounds, Abd Soft, No tenderness,    Extremity/Skin:- No  edema,   good  pulses Psych-affect is flat, sleepy Neuro-Generalized weakness, no new focal deficits, no tremors   Data Review   CBC w Diff:  Lab Results  Component Value Date   WBC 27.5 (H) 04/10/2021   HGB 13.5 04/10/2021   HCT 40.3 04/10/2021   PLT 17 (LL) 04/10/2021   LYMPHOPCT 1 04/08/2021   MONOPCT 2 04/08/2021   EOSPCT 0 04/08/2021   BASOPCT 0 04/08/2021    CMP:  Lab Results  Component Value Date   NA 146 (H) 04/10/2021   K 3.4 (L) 04/10/2021   CL 114 (H) 04/10/2021   CO2 24 04/10/2021   BUN 36 (H) 04/10/2021   CREATININE 0.66 04/11/2021   PROT 5.8 (L) 04/08/2021   ALBUMIN 3.2 (L) 04/08/2021   BILITOT 1.6 (H) 04/08/2021   ALKPHOS 59 04/08/2021   AST 25 04/08/2021   ALT 36 04/08/2021  .   Total Discharge time is about 33 minutes  Roxan Hockey M.D on 15-Apr-2021 at 11:49 AM  Go to [email or web address] -  for contact info  [facility] - Office  [phone]

## 2021-04-21 NOTE — Progress Notes (Addendum)
Manufacturing engineer [facility]) Hospital Liaison note.    11:46am: Consents complete.  TOC notified pt can be transported at this time.  Chart reviewed and eligibility confirmed for [facility]. Spoke with family to confirm interest and explain services. Family agreeable to transfer today. TOC aware.    [facility] will notify TOC when registration paperwork has been completed to arrange transport.   RN please call report to [phone]. Please be sure the signed [facility] DNR form transports with the patient.  Thank you for the opportunity to participate in this patient's care.  Domenic Moras, BSN, RN [facility] Liaison (listed on [place] under Hospice[facility])    [phone] (24h on call)

## 2021-04-21 NOTE — Progress Notes (Addendum)
PROGRESS NOTE    CEDERIC Le  [id number] DOB: 1927-01-19 DOA: 04/08/2021 PCP: Caprice Renshaw, MD    Chief Complaint  Patient presents with   Respiratory Distress    Brief admission narrative:  Manuel Le is a 87 y.o. male with medical history significant of chronic paroxysmal atrial fibrillation, coronary artery disease, hypertension and recent hospitalizations due to COVID-19 pneumonia; who came to the emergency department from the skilled nursing facility secondary to worsening respiratory status and hypoxia.  Patient was just discharged on 04/07/2021 using 5 L nasal cannula supplementation to nursing home for rehabilitation and further care.  On the day of admission despite using 6 L nasal cannula supplementation patient's saturation was in the mid 70s.  There was no fever appreciated.  Patient was tachypneic and denies chest pain.  No nausea, no vomiting, no hematuria, no dysuria, no melena, no hematochezia, no focal deficits.   As mentioned above recent history of COVID infection; patient is nonvaccinated.  During my evaluation patient is on 15 L high flow nasal cannula supplementation to keep O2 sats above 90%.   ED Course: Chest x-ray/CT angiogram of the chest demonstrating worsening multifocal pneumonia, elevated WBCs, elevated BMP, elevated troponin and elevated WBCs.  Cultures taken, IV broad-spectrum antibiotics initiated.  Gentle bolus of IV fluids provided.  Cardiology service curbside with recommendations to check 2D echo for completeness and cycle cardiac enzymes.  Not a candidate for interventions.  TRH consulted to place patient in the hospital for further evaluation and management.     Problem  End of Life Care  Hospice Care Patient  Acute Respiratory Disease Due to Covid-19 Virus  Acute On Chronic Respiratory Failure With Hypoxia (Hcc)  Acute Respiratory Failure Due to Covid-19 (Hcc)  Chronic Diastolic Chf (Congestive Heart Failure) (Hcc)  Palliative Care Encounter      Assessment & Plan: 1-severe sepsis secondary to pneumonia -Acute on chronic respiratory failure with hypoxia and recent hospitalization for COVID as part of increased risk for decompensation. -CT angiogram demonstrating what appears to be new multifocal infiltrates suggesting healthcare associated pneumonia versus aspiration. -Patient with elevated WBCs, tachypnea, worsening hypoxia and findings on CT chest to suggest sepsis. -Lactic acid was also elevated; which has trended down appropriately with judicious hydration.  Further labs will be done. -No fever. -Dysphagia type I with thin liquids as part of comfort feeding. -MRSA PCR negative, vancomycin discontinued -Antibiotics has been now discontinued;  April 20, 2021 Pt has been transitioned to full comfort care  -Residential hospice placement requested by family. -Okay to transfer to residential hospice when bed becomes available  2-elevated troponin -No chest pain -No acute ischemic changes on EKG or telemetry -2D echo reassuring -Most likely demand ischemia. comfort care and symptomatic management only.  3-chronic diastolic heart failure -Appears to be compensated Comfort care only.  4-essential hypertension/paroxysmal chronic atrial fibrillation -Continue comfort care measures  5-GERD/GI prophylaxis -will focus on comfort care only.  6-recent COVID-pneumonia -Patient has completed time for isolation  7-dysphagia -With concern for high risk aspiration -Seen by speech therapy with recommendation for dysphagia 1 with thin liquids; for comfort feeding  -Continue oral care twice a day.  8-DNR/DNI -Confirm and further discuss with healthcare power of attorney -Overall poor prognosis -Patient with significant risk factors and comorbidities along with recent active decline with COVID infection.   -Family expresses no intention for artificial nutrition. -After further discussion with family members  patient was transitioned  to to full comfort care for with plan transfer to  residential hospice placement. -26-Apr-2021 Pt has been transitioned to full comfort care  -Residential hospice placement requested by family. -Okay to transfer to residential hospice when bed becomes available    DVT prophylaxis: SCDs  CODE STATUS: DNR/DNI Family Communication: Patient's nephew has been updated over the phone. Disposition:   Status is: Inpatient  Remains inpatient appropriate because:IV treatments appropriate due to intensity of illness or inability to take PO  Dispo: The patient is from: SNF              Anticipated d/c is to: Hospice home.              Patient currently patient is medically stable for discharge to residential hospice house when bed becomes available   Difficult to place patient No    Consultants:  Cardiology service curbside.  Procedures: See below for x-ray reports. 2D echo:  1. Left ventricular ejection fraction, by estimation, is 65 to 70%. The  left ventricle has normal function. The left ventricle has no regional  wall motion abnormalities. There is moderate left ventricular hypertrophy.  Left ventricular diastolic  parameters are indeterminate.   2. Right ventricular systolic function is normal. The right ventricular  size is normal. There is normal pulmonary artery systolic pressure. The  estimated right ventricular systolic pressure is [id number] mmHg.   3. The mitral valve is degenerative. Trivial mitral valve regurgitation.  Moderate mitral annular calcification.   4. The aortic valve is tricuspid. There is mild calcification of the  aortic valve. Aortic valve regurgitation is not visualized. Mild, low  gradient aortic valve stenosis. Aortic valve area, by VTI measures 1.64  cm. Aortic valve mean gradient measures  6.0 mmHg. Dimentionless index 0.43.   5. The inferior vena cava is normal in size with greater than 50%  respiratory variability, suggesting right atrial pressure of 3 mmHg.    Antimicrobials:  Vancomycin and Cefepime at time of admission.   Subjective: 04/26/2021 Pt has been transitioned to full comfort care  -Residential hospice placement requested by family. -Okay to transfer to residential hospice when bed becomes available -Appears comfortable  Objective: Vitals:   04/12/21 0716 04/12/21 1334 04/12/21 2114 04/13/21 1503  BP:  131/77 (!) 149/107 118/68  Pulse:  94 96 98  Resp:  '18 16 20  '$ Temp:  98.8 F (37.1 C) 97.8 F (36.6 C) 98 F (36.7 C)  TempSrc:  Oral Oral Oral  SpO2: 90% 94% 92% 92%  Weight:        Intake/Output Summary (Last 24 hours) at April 26, 2021 0954 Last data filed at 2021/04/26 0700 Gross per 24 hour  Intake 0 ml  Output 500 ml  Net -500 ml   Filed Weights   04/10/21 0430  Weight: 88.7 kg    Examination: Physical Exam Gen:-Sleepy, chronically ill-appearing, appears comfortable HEENT:- [place].AT, No sclera icterus Nose- [place] 8 L/min Neck-Supple Neck,No JVD,.  Lungs-diminished air  movement, scattered wheezes/rhonchi bilaterally  CV- S1, S2 normal, RRR Abd-  +ve B.Sounds, Abd Soft, No tenderness,    Extremity/Skin:- No  edema,   good pulses Psych-affect is flat, sleepy Neuro-Generalized weakness, no new focal deficits, no tremors   Data Reviewed: I have personally reviewed following labs and imaging studies  CBC: Recent Labs  Lab 04/08/21 0811 04/10/21 0419  WBC 38.8* 27.5*  NEUTROABS 36.8*  --   HGB 17.1* 13.5  HCT 50.1 40.3  MCV 92.9 93.3  PLT 38* 17*    Basic Metabolic Panel: Recent Labs  Lab 04/08/21  [id number] 04/08/21 1803 04/10/21 0419 04/11/21 0511  NA 140  --  146*  --   K 3.8  --  3.4*  --   CL 106  --  114*  --   CO2 26  --  24  --   GLUCOSE 140*  --  178*  --   BUN 41*  --  36*  --   CREATININE 1.11  --  0.77 0.66  CALCIUM 9.2  --  8.8*  --   MG  --  2.3  --   --   PHOS  --  4.3  --   --     GFR: Estimated Creatinine Clearance: 64.7 mL/min (by C-G formula based on SCr of 0.66  mg/dL).  Liver Function Tests: Recent Labs  Lab 04/08/21 0811  AST 25  ALT 36  ALKPHOS 59  BILITOT 1.6*  PROT 5.8*  ALBUMIN 3.2*    CBG: Recent Labs  Lab 04/07/21 1124 04/07/21 1647  GLUCAP 210* 256*     Recent Results (from the past 240 hour(s))  Urine Culture     Status: Abnormal   Collection Time: 04/08/21  7:40 AM   Specimen: In/Out Cath Urine  Result Value Ref Range Status   Specimen Description   Final    IN/OUT CATH URINE Performed at [facility], [address] [place], [place] [address]    Special Requests   Final    NONE Performed at [facility], [address], [place], [place] [address]    Culture 8,000 COLONIES/mL KLEBSIELLA PNEUMONIAE (A)  Final   Report Status 04/11/2021 FINAL  Final   Organism ID, Bacteria KLEBSIELLA PNEUMONIAE (A)  Final      Susceptibility   Klebsiella pneumoniae - MIC*    AMPICILLIN >=32 RESISTANT Resistant     CEFAZOLIN <=4 SENSITIVE Sensitive     CEFTRIAXONE <=0.25 SENSITIVE Sensitive     CIPROFLOXACIN <=0.25 SENSITIVE Sensitive     GENTAMICIN <=1 SENSITIVE Sensitive     IMIPENEM <=0.25 SENSITIVE Sensitive     NITROFURANTOIN 64 INTERMEDIATE Intermediate     TRIMETH/SULFA <=20 SENSITIVE Sensitive     AMPICILLIN/SULBACTAM 8 SENSITIVE Sensitive     * 8,000 COLONIES/mL KLEBSIELLA PNEUMONIAE  Blood Culture (routine x 2)     Status: None   Collection Time: 04/08/21  8:46 AM   Specimen: BLOOD  Result Value Ref Range Status   Specimen Description BLOOD RIGHT ANTECUBITAL  Final   Special Requests   Final    BOTTLES DRAWN AEROBIC AND ANAEROBIC Blood Culture adequate volume   Culture   Final    NO GROWTH 5 DAYS Performed at [facility], [address] [place], [place] [address]    Report Status 04/13/2021 FINAL  Final  Culture, blood (Routine X 2) w Reflex to ID Panel     Status: None   Collection Time: 04/08/21 10:20 AM   Specimen: BLOOD  Result Value Ref Range Status   Specimen Description BLOOD LEFT ANTECUBITAL  Final    Special Requests   Final    BOTTLES DRAWN AEROBIC ONLY Blood Culture results may not be optimal due to an inadequate volume of blood received in culture bottles   Culture   Final    NO GROWTH 5 DAYS Performed at [facility], [address], [place], [place] [address]    Report Status 04/13/2021 FINAL  Final  MRSA Next Gen by PCR, Nasal     Status: None   Collection Time: 04/09/21 10:00 AM  Result Value Ref Range Status   MRSA by PCR Next Gen NOT DETECTED NOT DETECTED Final    Comment: (NOTE) The GeneXpert MRSA Assay (FDA approved for NASAL specimens only), is one component of a comprehensive MRSA colonization surveillance program. It is not intended to diagnose MRSA infection nor to guide or monitor treatment for MRSA infections. Test performance is not FDA approved in patients less than 15 years old. Performed at [facility], [address], [place], [place] [address]      Radiology Studies: No results found.   Scheduled Meds:  mouth rinse  15 mL Mouth Rinse q12n4p   Continuous Infusions:   LOS: 6 days    Roxan Hockey, MD [facility]   To contact the attending provider between 7A-7P or the covering provider during after hours 7P-7A, please log into the web site [email or web address] and access using universal [place] password for that web site. If you do not have the password, please call the hospital operator.  25-Apr-2021, 9:54 AM

## 2021-04-21 NOTE — Discharge Instructions (Signed)
-  Discharge to residential hospice with full comfort care

## 2021-04-21 NOTE — TOC Transition Note (Signed)
Transition of Care [facility]) - CM/SW Discharge Note   Patient Details  Name: Manuel Le MRN: [id number] Date of Birth: March 22, 1927  Transition of Care [facility]) CM/SW Contact:  Natasha Bence, LCSW Phone Number: 04-22-2021, 12:12 PM   Clinical Narrative:    CSW notified of patient's readiness for discharge. CSW completed med necessity. EMS arranged. [facility] agreeable to take patient. TOC signing off.    Final next level of care: [place] Barriers to Discharge: Barriers Resolved   Patient Goals and CMS Choice Patient states their goals for this hospitalization and ongoing recovery are:: Hospice CMS Medicare.gov Compare Post Acute Care list provided to:: Patient Choice offered to / list presented to : Patient  Discharge Placement                Patient to be transferred to facility by: [facility] EMS Name of family member notified: Pryor,Charles Alanson Puls)   [phone] Patient and family notified of of transfer: 22-Apr-2021  Discharge Plan and Services     Post Acute Care Choice: Hospice                               Social Determinants of Health (SDOH) Interventions     Readmission Risk Interventions Readmission Risk Prevention Plan 03/21/2021  Transportation Screening Complete  PCP or Specialist Appt within 3-5 Days Complete  HRI or Home Care Consult Complete  Social Work Consult for [place] Planning/Counseling Complete  Palliative Care Screening Not Applicable  Medication Review Press photographer) Complete  Some recent data might be hidden

## 2021-04-21 DEATH — deceased

## 2021-07-20 NOTE — Progress Notes (Signed)
This encounter was created in error - please disregard.

## 2022-10-29 IMAGING — CT CT ANGIO CHEST
2 of 6 series · 19 of 36 positions shown · IV contrast (omnipaque)
Comparison: 09/25/2017

CLINICAL DATA: [AGE] male with shortness of breath, COVID
positive.

EXAM:
CT ANGIOGRAPHY CHEST WITH CONTRAST
TECHNIQUE: Multidetector CT imaging of the chest was performed using the
standard protocol during bolus administration of intravenous
contrast. Multiplanar CT image reconstructions and MIPs were
obtained to evaluate the vascular anatomy.
CONTRAST:  100 mL Omnipaque 350, intravenous

[Series 5: pe axial thins · axial · 0.88mm/px · z∈[+1058,+1378]mm · 18 of 444 slices shown]
[im 22/444  lung]
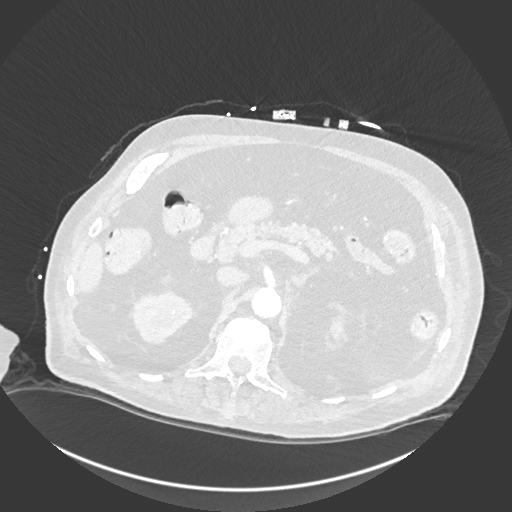
[im 43/444  mediastinal]
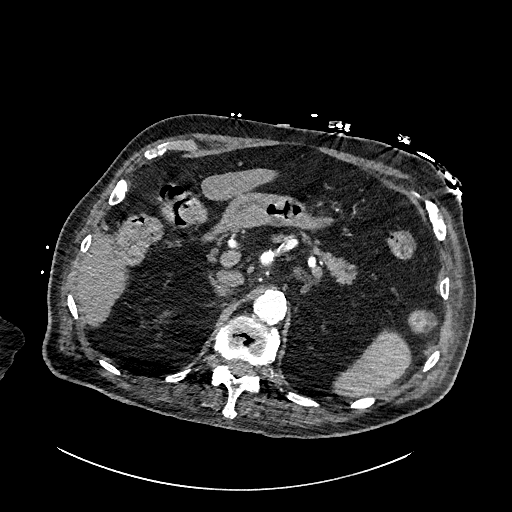
[im 64/444  lung]
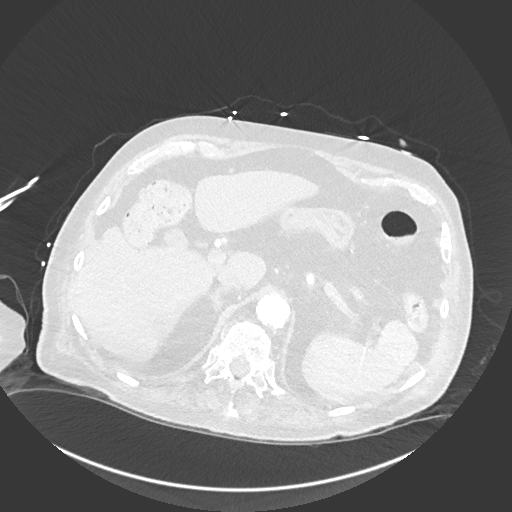
[im 85/444  mediastinal]
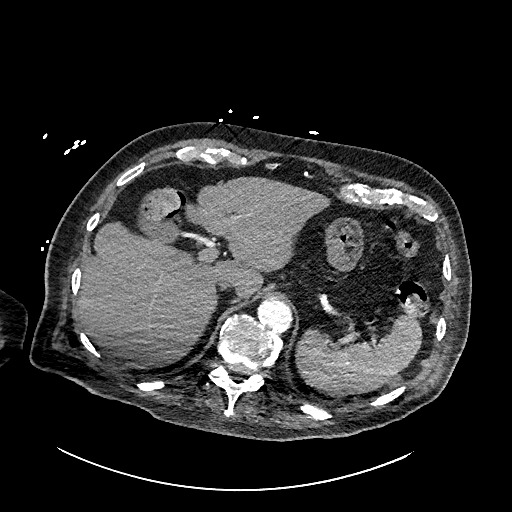
[im 127/444  lung]
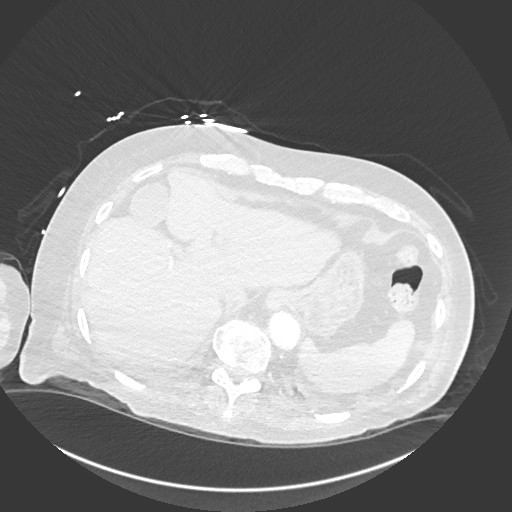
[im 148/444  mediastinal]
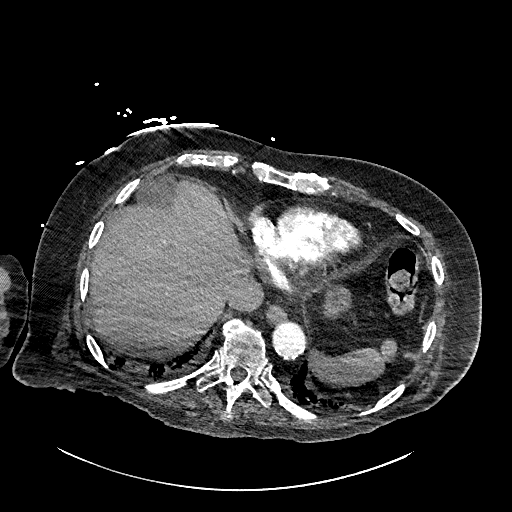
[im 169/444  lung]
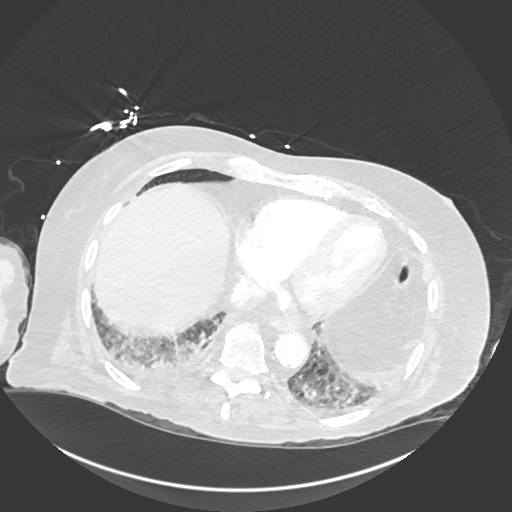
[im 190/444  mediastinal]
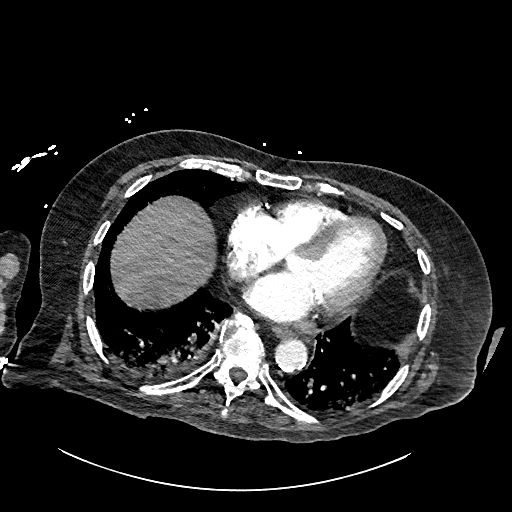
[im 211/444  lung]
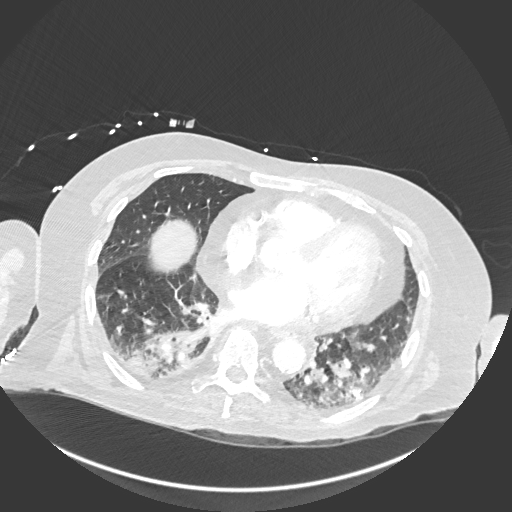
[im 233/444  mediastinal]
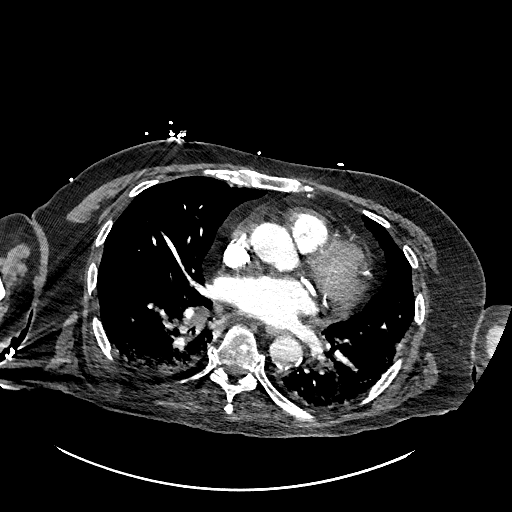
[im 254/444  lung]
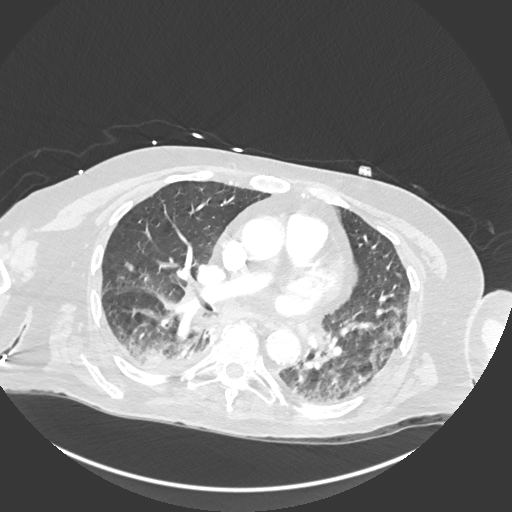
[im 275/444  mediastinal]
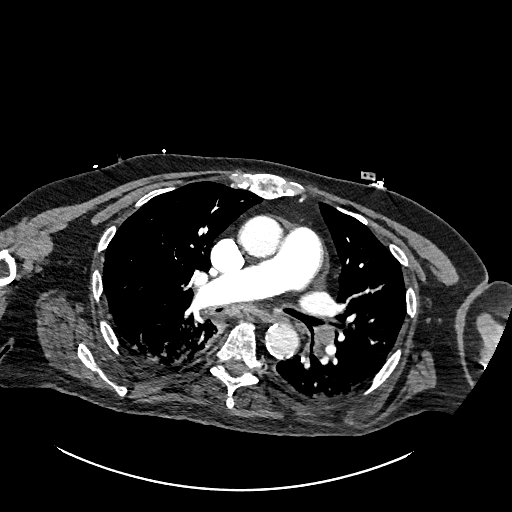
[im 296/444  lung]
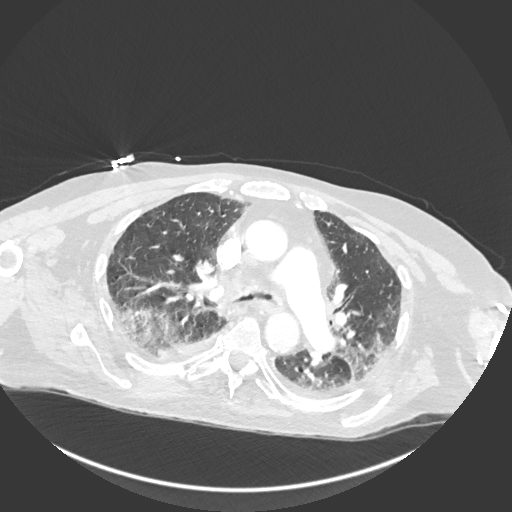
[im 338/444  mediastinal]
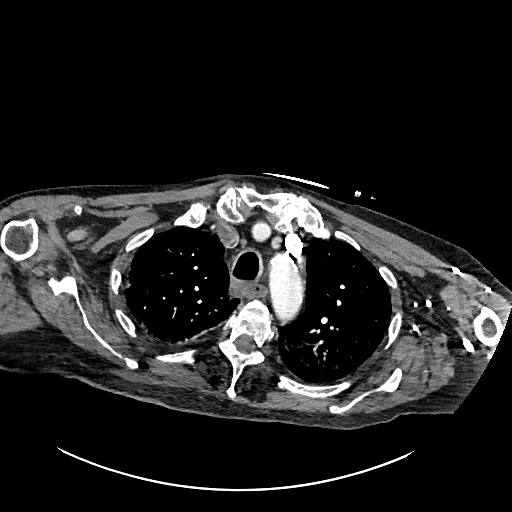
[im 359/444  lung]
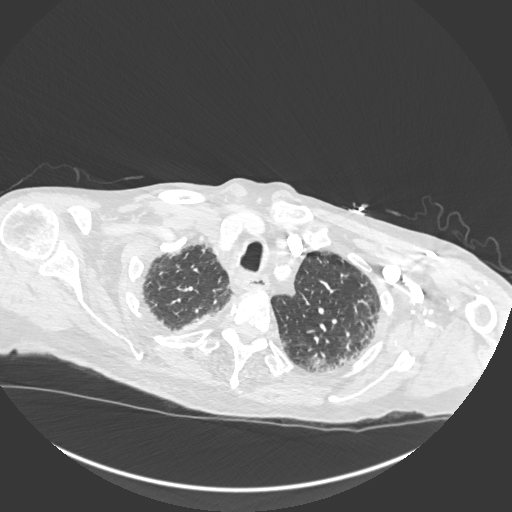
[im 380/444  mediastinal]
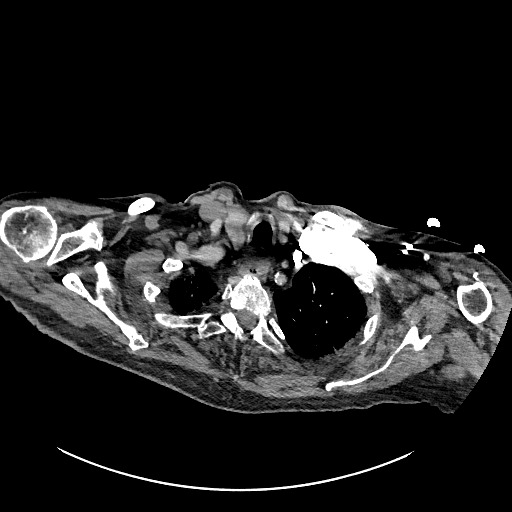
[im 401/444  lung]
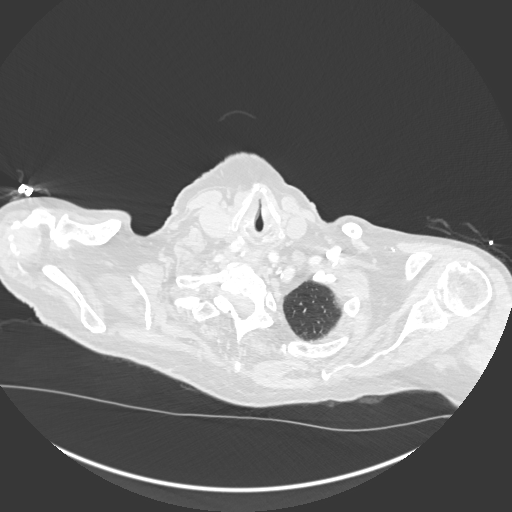
[im 422/444  mediastinal]
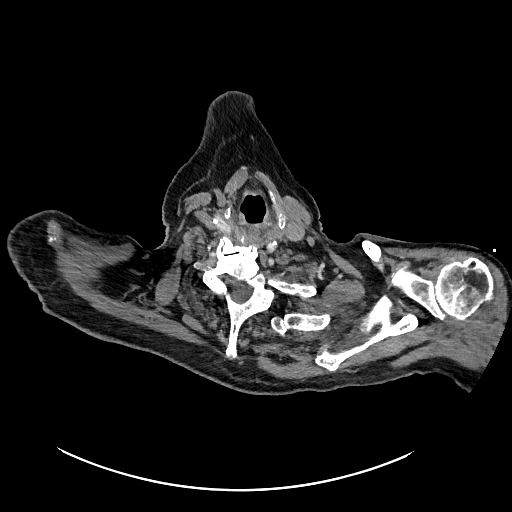

[Series 7: cor soft · coronal · 0.69mm/px · 1 of 155 slices shown]
[im 78/155  mediastinal]
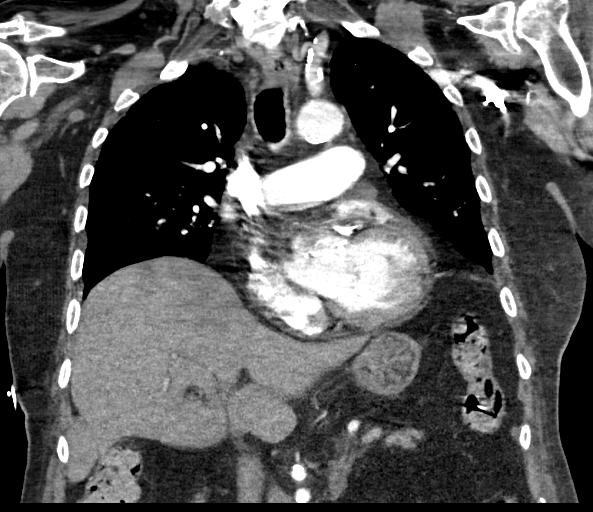

[19 of 36 positions shown; findings below may reference images not displayed]

FINDINGS: Cardiovascular: Satisfactory opacification of the pulmonary arteries
to the segmental level. No evidence of pulmonary embolism. Normal
heart size. No pericardial effusion. Coronary, aortic valvular,
mitral annular, and aortic atherosclerotic calcifications are noted.

Mediastinum/Nodes: No enlarged mediastinal, hilar, or axillary lymph
nodes. Thyroid gland, trachea, and esophagus demonstrate no
significant findings.

Lungs/Pleura: Similar appearing bibasilar peribronchovascular
thickening with associated dependent atelectasis and scattered
consolidative opacities. No new consolidative opacities, significant
pleural effusion, pneumothorax. Mild emphysematous changes are
noted. Mild

Upper Abdomen: The visualized upper abdomen is within normal limits.

Musculoskeletal: No chest wall abnormality. No acute or significant
osseous findings.

Review of the MIP images confirms the above findings.
IMPRESSION: Vascular:

1. No evidence of pulmonary embolism.
2. Coronary and aortic atherosclerosis ([id number]-[id number]).

Non-Vascular:

1. Bibasilar bronchial wall thickening and associated atelectasis,
similar to 6133 comparison, likely indicative of sequela of chronic
aspiration. Superimposed pneumonia could appear similarly.
2.  Mild emphysema ([id number].J).

## 2022-10-29 IMAGING — DX DG CHEST 1V PORT
1 series · 1 of 1 positions shown · non-contrast
Comparison: Chest radiographs 07/27/2019 and earlier.

CLINICAL DATA: [AGE] male with shortness of breath for 1
week. Positive [id number].

EXAM:
PORTABLE CHEST 1 VIEW

[chest ap]
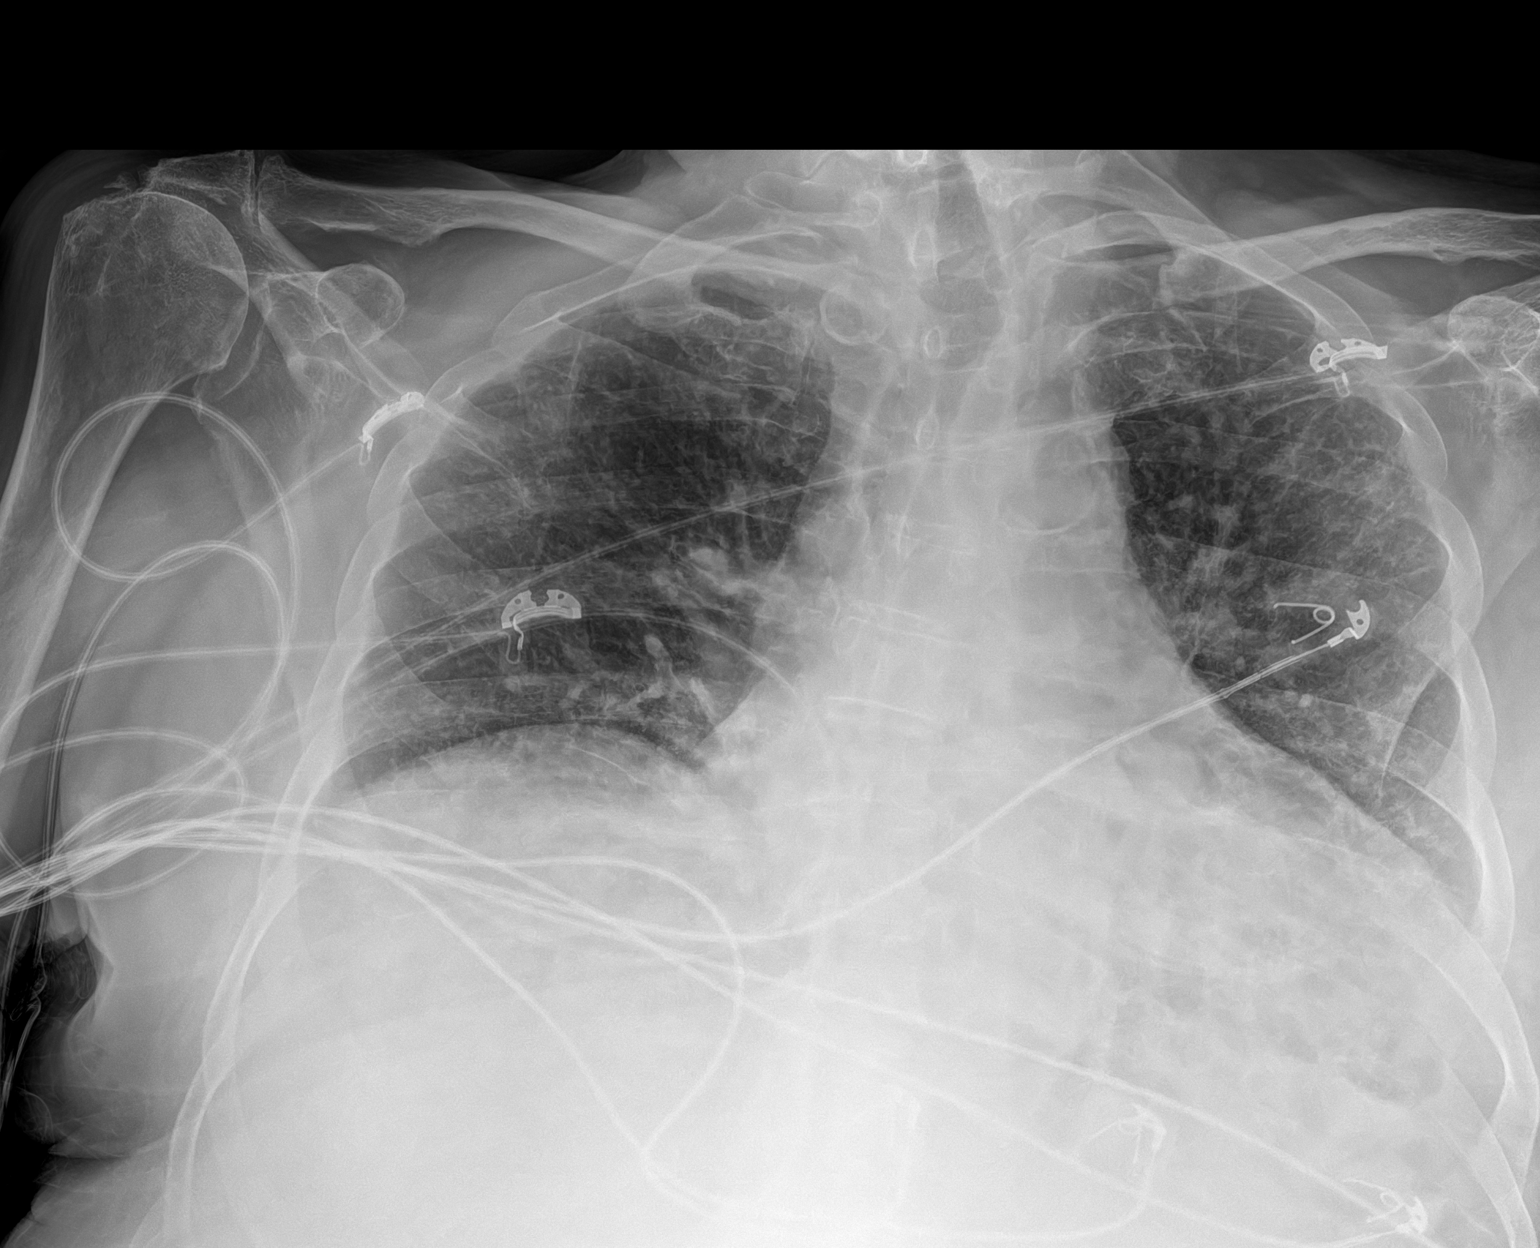

[1 of 1 positions shown; findings below may reference images not displayed]

FINDINGS: Portable AP semi upright view at 5622 hours. Lower lung volumes.
Indistinct increased lung base opacity greater on the left. Stable
cardiac size and mediastinal contours. No pneumothorax or pulmonary
edema. No definite pleural effusion. No other confluent pulmonary
opacity. Visualized tracheal air column is within normal limits.
Paucity of bowel gas in the upper abdomen. No acute osseous
abnormality identified.
IMPRESSION: Low lung volumes with nonspecific bibasilar opacity greater on the
left, possibly atelectasis but left lung base pneumonia not
excluded. PA and lateral views would be helpful when feasible.
